# Patient Record
Sex: Male | Born: 1935 | Race: White | Hispanic: Yes | Smoking: Former smoker
Health system: Southern US, Community
[De-identification: ages and names within clinical notes are randomized; demographics above are authoritative.]

## PROBLEM LIST (undated history)

## (undated) ENCOUNTER — Emergency Department (HOSPITAL_COMMUNITY): Payer: Medicare HMO | Source: Home / Self Care

## (undated) DIAGNOSIS — C61 Malignant neoplasm of prostate: Secondary | ICD-10-CM

## (undated) DIAGNOSIS — I10 Essential (primary) hypertension: Secondary | ICD-10-CM

## (undated) DIAGNOSIS — H409 Unspecified glaucoma: Secondary | ICD-10-CM

## (undated) DIAGNOSIS — M7122 Synovial cyst of popliteal space [Baker], left knee: Secondary | ICD-10-CM

## (undated) DIAGNOSIS — G709 Myoneural disorder, unspecified: Secondary | ICD-10-CM

## (undated) HISTORY — PX: INSERTION BRACHYTHERAPY DEVICE: SHX6582

## (undated) HISTORY — DX: Essential (primary) hypertension: I10

## (undated) HISTORY — DX: Malignant neoplasm of prostate: C61

## (undated) HISTORY — DX: Unspecified glaucoma: H40.9

## (undated) HISTORY — PX: CATARACT EXTRACTION: SUR2

## (undated) HISTORY — PX: HERNIA REPAIR: SHX51

---

## 2015-06-10 DIAGNOSIS — R69 Illness, unspecified: Secondary | ICD-10-CM | POA: Diagnosis not present

## 2015-06-17 DIAGNOSIS — E7800 Pure hypercholesterolemia, unspecified: Secondary | ICD-10-CM | POA: Diagnosis not present

## 2015-06-26 DIAGNOSIS — R69 Illness, unspecified: Secondary | ICD-10-CM | POA: Diagnosis not present

## 2015-07-16 DIAGNOSIS — H40053 Ocular hypertension, bilateral: Secondary | ICD-10-CM | POA: Diagnosis not present

## 2015-10-07 DIAGNOSIS — H401131 Primary open-angle glaucoma, bilateral, mild stage: Secondary | ICD-10-CM | POA: Diagnosis not present

## 2015-11-13 DIAGNOSIS — I350 Nonrheumatic aortic (valve) stenosis: Secondary | ICD-10-CM | POA: Diagnosis not present

## 2015-11-13 DIAGNOSIS — H4010X0 Unspecified open-angle glaucoma, stage unspecified: Secondary | ICD-10-CM | POA: Diagnosis not present

## 2015-11-13 DIAGNOSIS — Z0000 Encounter for general adult medical examination without abnormal findings: Secondary | ICD-10-CM | POA: Diagnosis not present

## 2015-11-13 DIAGNOSIS — E7800 Pure hypercholesterolemia, unspecified: Secondary | ICD-10-CM | POA: Diagnosis not present

## 2015-11-13 DIAGNOSIS — Z8546 Personal history of malignant neoplasm of prostate: Secondary | ICD-10-CM | POA: Diagnosis not present

## 2015-11-13 DIAGNOSIS — Z79899 Other long term (current) drug therapy: Secondary | ICD-10-CM | POA: Diagnosis not present

## 2015-11-17 DIAGNOSIS — H4010X0 Unspecified open-angle glaucoma, stage unspecified: Secondary | ICD-10-CM | POA: Diagnosis not present

## 2015-11-17 DIAGNOSIS — Z0000 Encounter for general adult medical examination without abnormal findings: Secondary | ICD-10-CM | POA: Diagnosis not present

## 2015-11-17 DIAGNOSIS — E7800 Pure hypercholesterolemia, unspecified: Secondary | ICD-10-CM | POA: Diagnosis not present

## 2015-11-17 DIAGNOSIS — Z8546 Personal history of malignant neoplasm of prostate: Secondary | ICD-10-CM | POA: Diagnosis not present

## 2015-11-17 DIAGNOSIS — Z79899 Other long term (current) drug therapy: Secondary | ICD-10-CM | POA: Diagnosis not present

## 2015-12-30 DIAGNOSIS — R69 Illness, unspecified: Secondary | ICD-10-CM | POA: Diagnosis not present

## 2016-01-12 DIAGNOSIS — H4010X0 Unspecified open-angle glaucoma, stage unspecified: Secondary | ICD-10-CM | POA: Diagnosis not present

## 2016-01-12 DIAGNOSIS — E7800 Pure hypercholesterolemia, unspecified: Secondary | ICD-10-CM | POA: Diagnosis not present

## 2016-01-26 DIAGNOSIS — R69 Illness, unspecified: Secondary | ICD-10-CM | POA: Diagnosis not present

## 2016-01-29 DIAGNOSIS — R69 Illness, unspecified: Secondary | ICD-10-CM | POA: Diagnosis not present

## 2016-02-05 DIAGNOSIS — H401131 Primary open-angle glaucoma, bilateral, mild stage: Secondary | ICD-10-CM | POA: Diagnosis not present

## 2016-07-12 DIAGNOSIS — R69 Illness, unspecified: Secondary | ICD-10-CM | POA: Diagnosis not present

## 2016-08-19 DIAGNOSIS — H40053 Ocular hypertension, bilateral: Secondary | ICD-10-CM | POA: Diagnosis not present

## 2016-11-11 DIAGNOSIS — E7800 Pure hypercholesterolemia, unspecified: Secondary | ICD-10-CM | POA: Diagnosis not present

## 2016-11-11 DIAGNOSIS — Z79899 Other long term (current) drug therapy: Secondary | ICD-10-CM | POA: Diagnosis not present

## 2016-11-15 DIAGNOSIS — I350 Nonrheumatic aortic (valve) stenosis: Secondary | ICD-10-CM | POA: Diagnosis not present

## 2016-11-15 DIAGNOSIS — R030 Elevated blood-pressure reading, without diagnosis of hypertension: Secondary | ICD-10-CM | POA: Diagnosis not present

## 2016-11-15 DIAGNOSIS — Z8546 Personal history of malignant neoplasm of prostate: Secondary | ICD-10-CM | POA: Diagnosis not present

## 2016-11-15 DIAGNOSIS — H4010X0 Unspecified open-angle glaucoma, stage unspecified: Secondary | ICD-10-CM | POA: Diagnosis not present

## 2016-11-15 DIAGNOSIS — E7800 Pure hypercholesterolemia, unspecified: Secondary | ICD-10-CM | POA: Diagnosis not present

## 2016-11-15 DIAGNOSIS — Z0000 Encounter for general adult medical examination without abnormal findings: Secondary | ICD-10-CM | POA: Diagnosis not present

## 2017-02-19 DIAGNOSIS — R69 Illness, unspecified: Secondary | ICD-10-CM | POA: Diagnosis not present

## 2017-02-25 DIAGNOSIS — H401134 Primary open-angle glaucoma, bilateral, indeterminate stage: Secondary | ICD-10-CM | POA: Diagnosis not present

## 2017-02-28 DIAGNOSIS — R69 Illness, unspecified: Secondary | ICD-10-CM | POA: Diagnosis not present

## 2017-03-25 DIAGNOSIS — H401134 Primary open-angle glaucoma, bilateral, indeterminate stage: Secondary | ICD-10-CM | POA: Diagnosis not present

## 2017-05-18 DIAGNOSIS — H539 Unspecified visual disturbance: Secondary | ICD-10-CM | POA: Diagnosis not present

## 2017-05-18 DIAGNOSIS — I10 Essential (primary) hypertension: Secondary | ICD-10-CM | POA: Diagnosis not present

## 2017-05-18 DIAGNOSIS — G909 Disorder of the autonomic nervous system, unspecified: Secondary | ICD-10-CM | POA: Diagnosis not present

## 2017-05-18 DIAGNOSIS — R2689 Other abnormalities of gait and mobility: Secondary | ICD-10-CM | POA: Diagnosis not present

## 2017-06-21 DIAGNOSIS — M25551 Pain in right hip: Secondary | ICD-10-CM | POA: Diagnosis not present

## 2017-06-21 DIAGNOSIS — M545 Low back pain: Secondary | ICD-10-CM | POA: Diagnosis not present

## 2017-06-27 DIAGNOSIS — H401134 Primary open-angle glaucoma, bilateral, indeterminate stage: Secondary | ICD-10-CM | POA: Diagnosis not present

## 2017-06-29 ENCOUNTER — Encounter: Payer: Self-pay | Admitting: Rehabilitative and Restorative Service Providers"

## 2017-06-29 ENCOUNTER — Ambulatory Visit (INDEPENDENT_AMBULATORY_CARE_PROVIDER_SITE_OTHER): Payer: Medicare HMO | Admitting: Rehabilitative and Restorative Service Providers"

## 2017-06-29 DIAGNOSIS — M6281 Muscle weakness (generalized): Secondary | ICD-10-CM | POA: Diagnosis not present

## 2017-06-29 DIAGNOSIS — R29898 Other symptoms and signs involving the musculoskeletal system: Secondary | ICD-10-CM | POA: Diagnosis not present

## 2017-06-29 DIAGNOSIS — M545 Low back pain, unspecified: Secondary | ICD-10-CM

## 2017-06-29 DIAGNOSIS — R2689 Other abnormalities of gait and mobility: Secondary | ICD-10-CM | POA: Diagnosis not present

## 2017-06-29 NOTE — Patient Instructions (Signed)
HIP: Hamstrings - Supine  Place strap around foot. Raise leg up, keeping knee straight.  Bend opposite knee to protect back if indicated. Hold 30 seconds. 3 reps per set, 2-3 sets per day  Outer Hip Stretch: Reclined IT Band Stretch (Strap)   Strap around one foot, pull leg across body until you feel a pull or stretch in the outside of your hip, with shoulders on mat. Hold for 30 seconds. Repeat 3 times each leg. 2-3 times/day.  Piriformis Stretch   Lying on back, pull right knee toward opposite shoulder. Hold 30 seconds. Repeat 3 times. Do 2-3 sessions per day.  TENS UNIT: This is helpful for muscle pain and spasm.   Search and Purchase a TENS 7000 2nd edition at www.tenspros.com. It should be less than $30.     TENS unit instructions: Do not shower or bathe with the unit on Turn the unit off before removing electrodes or batteries If the electrodes lose stickiness add a drop of water to the electrodes after they are disconnected from the unit and place on plastic sheet. If you continued to have difficulty, call the TENS unit company to purchase more electrodes. Do not apply lotion on the skin area prior to use. Make sure the skin is clean and dry as this will help prolong the life of the electrodes. After use, always check skin for unusual red areas, rash or other skin difficulties. If there are any skin problems, does not apply electrodes to the same area. Never remove the electrodes from the unit by pulling the wires. Do not use the TENS unit or electrodes other than as directed. Do not change electrode placement without consultating your therapist or physician. Keep 2 fingers with between each electrode.   Sleeping on Back  Place pillow under knees. A pillow with cervical support and a roll around waist are also helpful. Copyright  VHI. All rights reserved.  Sleeping on Side Place pillow between knees. Use cervical support under neck and a roll around waist as  needed. Copyright  VHI. All rights reserved.   Sleeping on Stomach   If this is the only desirable sleeping position, place pillow under lower legs, and under stomach or chest as needed.  Posture - Sitting   Sit upright, head facing forward. Try using a roll to support lower back. Keep shoulders relaxed, and avoid rounded back. Keep hips level with knees. Avoid crossing legs for long periods. Stand to Sit / Sit to Stand   To sit: Bend knees to lower self onto front edge of chair, then scoot back on seat. To stand: Reverse sequence by placing one foot forward, and scoot to front of seat. Use rocking motion to stand up.   Work Height and Reach  Ideal work height is no more than 2 to 4 inches below elbow level when standing, and at elbow level when sitting. Reaching should be limited to arm's length, with elbows slightly bent.  Bending  Bend at hips and knees, not back. Keep feet shoulder-width apart.    Posture - Standing   Good posture is important. Avoid slouching and forward head thrust. Maintain curve in low back and align ears over shoul- ders, hips over ankles.  Alternating Positions   Alternate tasks and change positions frequently to reduce fatigue and muscle tension. Take rest breaks. Computer Work   Position work to Programmer, multimedia. Use proper work and seat height. Keep shoulders back and down, wrists straight, and elbows at right angles. Use  chair that provides full back support. Add footrest and lumbar roll as needed.  Getting Into / Out of Car  Lower self onto seat, scoot back, then bring in one leg at a time. Reverse sequence to get out.  Dressing  Lie on back to pull socks or slacks over feet, or sit and bend leg while keeping back straight.    Housework - Sink  Place one foot on ledge of cabinet under sink when standing at sink for prolonged periods.   Pushing / Pulling  Pushing is preferable to pulling. Keep back in proper alignment, and use leg  muscles to do the work.  Deep Squat   Squat and lift with both arms held against upper trunk. Tighten stomach muscles without holding breath. Use smooth movements to avoid jerking.  Avoid Twisting   Avoid twisting or bending back. Pivot around using foot movements, and bend at knees if needed when reaching for articles.  Carrying Luggage   Distribute weight evenly on both sides. Use a cart whenever possible. Do not twist trunk. Move body as a unit.   Lifting Principles .Maintain proper posture and head alignment. .Slide object as close as possible before lifting. .Move obstacles out of the way. .Test before lifting; ask for help if too heavy. .Tighten stomach muscles without holding breath. .Use smooth movements; do not jerk. .Use legs to do the work, and pivot with feet. .Distribute the work load symmetrically and close to the center of trunk. .Push instead of pull whenever possible.   Ask For Help   Ask for help and delegate to others when possible. Coordinate your movements when lifting together, and maintain the low back curve.  Log Roll   Lying on back, bend left knee and place left arm across chest. Roll all in one movement to the right. Reverse to roll to the left. Always move as one unit. Housework - Sweeping  Use long-handled equipment to avoid stooping.   Housework - Wiping  Position yourself as close as possible to reach work surface. Avoid straining your back.  Laundry - Unloading Wash   To unload small items at bottom of washer, lift leg opposite to arm being used to reach.  [place] close to area to be raked. Use arm movements to do the work. Keep back straight and avoid twisting.     Cart  When reaching into cart with one arm, lift opposite leg to keep back straight.   Getting Into / Out of Bed  Lower self to lie down on one side by raising legs and lowering head at the same time. Use arms to assist moving without twisting.  Bend both knees to roll onto back if desired. To sit up, start from lying on side, and use same move-ments in reverse. Housework - Vacuuming  Hold the vacuum with arm held at side. Step back and forth to move it, keeping head up. Avoid twisting.   Laundry - IT consultant so that bending and twisting can be avoided.   Laundry - Unloading Dryer  Squat down to reach into clothes dryer or use a reacher.  Gardening - Weeding / Probation officer or Kneel. Knee pads may be helpful.

## 2017-06-29 NOTE — Therapy (Signed)
[facility] [address] [place], [place], [address] Phone: [phone]   Fax:  [phone]  Physical Therapy Evaluation  Patient Details  Name: Council Munguia MRN: [id number] Date of Birth: [birth year] Referring Provider: Dr Ophelia Charter; Dr [name]    Encounter Date: 06/29/2017  PT End of Session - 06/29/17 1418    Visit Number  1    Number of Visits  12    Date for PT Re-Evaluation  08/10/17    PT Start Time  1418    PT Stop Time  1520    PT Time Calculation (min)  62 min    Activity Tolerance  Patient tolerated treatment well       History reviewed. No pertinent past medical history.  History reviewed. No pertinent surgical history.  There were no vitals filed for this visit.   Subjective Assessment - 06/29/17 1422    Subjective  Patient reports that he has had difficulty with walking for about three months. He had several events contributing to symptoms - reaction to medications, swelling in ankles and knees; elevated HR; statin drugs. He noticed shuffling gait pattern in past three months and weakness in the Rt lumbar area.     Pertinent History  intraocular pressure; elevated cholestrol; heart murmur (Patient does not have a list of current medications- requested)     Patient Stated Goals  decrease pain in the Rt LB when standing and walking; improve shuffling gait     Currently in Pain?  Yes    Pain Score  0-No pain 3/10 with standing and walking     Pain Location  Back    Pain Orientation  Right    Pain Descriptors / Indicators  Aching;Dull    Pain Type  Acute pain    Pain Onset  More than a month ago    Pain Frequency  Intermittent    Aggravating Factors   standing and walking     Pain Relieving Factors  sitting; meds          [facility] PT Assessment - 06/29/17 0001      Assessment   Medical Diagnosis  LBP; LE weakness; shuffling gait     Referring Provider  Dr Ophelia Charter; Dr [name]     Onset  Date/Surgical Date  03/26/17    Hand Dominance  Right    Next MD Visit  PRN     Prior Therapy  none for this condition       Precautions   Precautions  None      Balance Screen   Has the patient fallen in the past 6 months  No    Has the patient had a decrease in activity level because of a fear of falling?   No    Is the patient reluctant to leave their home because of a fear of falling?   No      Prior Function   Level of Independence  Independent lives alone     Vocation  Retired    Landscape architect - retired 2009     Leisure  household chores; yard work; Teacher, English as a foreign language; reading; radio; visit family; exercise daily squats and stretches       Sensation   Additional Comments  WFL's per pt report       Posture/Postural Control   Posture Comments  head forward; shoulders rounded and elevated; flexed forward at trunk and hips       AROM  Right/Left Hip  -- end range tightness bilat esp in hip extension     Lumbar Flexion  80% pull Rt LB    Lumbar Extension  25%    Lumbar - Right Side Bend  60%    Lumbar - Left Side Bend  50% pull Rt LB    Lumbar - Right Rotation  10%    Lumbar - Left Rotation  20% pull Rt LB       Strength   Strength Assessment Site  -- bilat knee flex/ext 5/5     Right/Left Hip  -- hip 5/5 except as noted     Right Hip Extension  4+/5    Left Hip Extension  4/5      Flexibility   Hamstrings  tight Rt 62 deg; Lt 57 deg    Quadriceps  tigth bilat     ITB  tight bilat     Piriformis  tight Rt > Lt       Palpation   Spinal mobility  hypomobility lumbar spine     SI assessment   tender Rt SI area     Palpation comment  muscular tightness lumbar paraspinals; QL; piriformis; glut med/min Rt       Ambulation/Gait   Gait Comments  shuffling gait pattern with initiation of gait; improved gait pattern wihen gait is established. -- carries single point cane which is too tall minimal to no functional use of cane       Berg Balance Test   Sit to Stand   Able to stand without using hands and stabilize independently    Standing Unsupported  Able to stand safely 2 minutes    Sitting with Back Unsupported but Feet Supported on Floor or Stool  Able to sit safely and securely 2 minutes    Stand to Sit  Controls descent by using hands    Transfers  Able to transfer safely, minor use of hands    Standing Unsupported with Eyes Closed  Able to stand 10 seconds safely    Standing Ubsupported with Feet Together  Able to place feet together independently and stand 1 minute safely    From Standing, Reach Forward with Outstretched Arm  Can reach confidently >25 cm (10")    From Standing Position, Pick up Object from Floor  Able to pick up shoe safely and easily    From Standing Position, Turn to Look Behind Over each Shoulder  Looks behind from both sides and weight shifts well    Turn 360 Degrees  Able to turn 360 degrees safely but slowly    Standing Unsupported, Alternately Place Feet on Step/Stool  Able to complete >2 steps/needs minimal assist    Standing Unsupported, One Foot in Front  Able to place foot tandem independently and hold 30 seconds    Standing on One Leg  Unable to try or needs assist to prevent fall    Total Score  46    Berg comment:  moderate risk for falls              Objective measurements completed on examination: See above findings.      [facility] Adult PT Treatment/Exercise - 06/29/17 0001      Lumbar Exercises: Stretches   Passive Hamstring Stretch  Right;Left;2 reps;30 seconds supine with strap     ITB Stretch  Right;Left;2 reps;30 seconds supine with strap     Piriformis Stretch  Right;Left;2 reps;30 seconds supine travell  Moist Heat Therapy   Number Minutes Moist Heat  20 Minutes    Moist Heat Location  Lumbar Spine;Hip Rt      Electrical Stimulation   Electrical Stimulation Location  Rt lumbar paraspinals; Rt posterior hip     Electrical Stimulation Action  IFC    Electrical Stimulation Parameters  to  tolerance    Electrical Stimulation Goals  Pain;Tone             PT Education - 06/29/17 1502    Education provided  Yes    Education Details  HEP TENS back care     Person(s) Educated  Patient    Methods  Explanation;Demonstration;Tactile cues;Verbal cues;Handout    Comprehension  Verbalized understanding;Returned demonstration;Verbal cues required;Tactile cues required          PT Long Term Goals - 06/29/17 1525      PT LONG TERM GOAL #1   Title  Improve posture and alignment with standing and walking 08/10/17    Time  6    Period  Weeks    Status  New      PT LONG TERM GOAL #2   Title  Increase strength bilat LE's to 5/5 with manual muscle testing 08/10/17    Time  6    Period  Weeks    Status  New      PT LONG TERM GOAL #3   Title  Improve Berg Balance scale score to indicated decrease fall risk 08/10/17    Time  6    Period  Weeks    Status  New      PT LONG TERM GOAL #4   Title  Improve gait pattern with improved initiation of gait thus improving gait function and gait 08/10/17    Time  6    Period  Weeks    Status  New      PT LONG TERM GOAL #5   Title  Independent in HEP 08/10/17    Time  6    Period  Weeks    Status  New             Plan - 06/29/17 1519    Clinical Impression Statement  Thayer presents with reports of 3 month history of Rt LBP which he feels has created shuffling giat pattern. He has limited trunk and LE mobility and ROM; decreased hip extension strength; muscular tightness to palpation Rt lumbar and Rt hip; abnormal gait pattern with difficulty initiating gait. Gait pattern improves with continued gait. He has moderated risk for falls based on Berg Balance Scale. Kentarius will benefit from PT to address problems identified. Vasiliy is interested in trial of dry needling for Rt lumbar and hip pain.     Clinical Presentation  Evolving    Clinical Decision Making  Low    Rehab Potential  Good    PT Frequency  2x / week    PT Duration   6 weeks    PT Treatment/Interventions  Patient/family education;ADLs/Self Care Home Management;Cryotherapy;Electrical Stimulation;Iontophoresis 4mg /ml Dexamethasone;Moist Heat;Ultrasound;Dry needling;Manual techniques;Neuromuscular re-education;Therapeutic activities;Therapeutic exercise    PT Next Visit Plan  review HEP; add core exercise; trial of DN vs manual work; further assess gait and adjust cane height (too tall currently); back care education; modalities as indicated     Consulted and Agree with Plan of Care  Patient       Patient will benefit from skilled therapeutic intervention in order to improve the following deficits and impairments:  Postural  dysfunction, Improper body mechanics, Pain, Increased muscle spasms, Increased fascial restricitons, Decreased mobility, Decreased range of motion, Decreased strength, Decreased activity tolerance, Abnormal gait  Visit Diagnosis: Other abnormalities of gait and mobility - Plan: PT plan of care cert/re-cert, CANCELED: PT plan of care cert/re-cert  Acute right-sided low back pain without sciatica - Plan: PT plan of care cert/re-cert, CANCELED: PT plan of care cert/re-cert  Other symptoms and signs involving the musculoskeletal system - Plan: PT plan of care cert/re-cert, CANCELED: PT plan of care cert/re-cert  Muscle weakness (generalized) - Plan: PT plan of care cert/re-cert, CANCELED: PT plan of care cert/re-cert     Problem List There are no active problems to display for this patient.   [place], MPH  06/29/2017, 5:25 PM  [facility] [place] [place], [place], [address] Phone: [phone]   Fax:  [phone]  Name: Romaldo Saville MRN: [id number] Date of Birth: Mar 02, 1932

## 2017-07-04 ENCOUNTER — Ambulatory Visit: Payer: Medicare HMO | Admitting: Neurology

## 2017-07-08 ENCOUNTER — Encounter: Payer: Self-pay | Admitting: Rehabilitative and Restorative Service Providers"

## 2017-07-08 ENCOUNTER — Ambulatory Visit: Payer: Medicare HMO | Admitting: Rehabilitative and Restorative Service Providers"

## 2017-07-08 DIAGNOSIS — R2689 Other abnormalities of gait and mobility: Secondary | ICD-10-CM

## 2017-07-08 DIAGNOSIS — R29898 Other symptoms and signs involving the musculoskeletal system: Secondary | ICD-10-CM

## 2017-07-08 DIAGNOSIS — M545 Low back pain, unspecified: Secondary | ICD-10-CM

## 2017-07-08 DIAGNOSIS — M6281 Muscle weakness (generalized): Secondary | ICD-10-CM | POA: Diagnosis not present

## 2017-07-08 NOTE — Patient Instructions (Addendum)
Bridging    Slowly raise buttocks from floor, keeping stomach tight. Repeat _8-10___ times per set.  Do ___2_ sessions per day.    Strengthening: Hip Abductor - Resisted    Lying flat With band looped around both legs above knees, push one leg to the side while holding opposite leg still - repeat with opposite leg  Repeat __10__ times per set. Do _1-2___ sets per session. Do __1__ sessions per day.   Trigger Point Dry Needling  . What is Trigger Point Dry Needling (DN)? o DN is a physical therapy technique used to treat muscle pain and dysfunction. Specifically, DN helps deactivate muscle trigger points (muscle knots).  o A thin filiform needle is used to penetrate the skin and stimulate the underlying trigger point. The goal is for a local twitch response (LTR) to occur and for the trigger point to relax. No medication of any kind is injected during the procedure.   . What Does Trigger Point Dry Needling Feel Like?  o The procedure feels different for each individual patient. Some patients report that they do not actually feel the needle enter the skin and overall the process is not painful. Very mild bleeding may occur. However, many patients feel a deep cramping in the muscle in which the needle was inserted. This is the local twitch response.   Marland Kitchen How Will I feel after the treatment? o Soreness is normal, and the onset of soreness may not occur for a few hours. Typically this soreness does not last longer than two days.  o Bruising is uncommon, however; ice can be used to decrease any possible bruising.  o In rare cases feeling tired or nauseous after the treatment is normal. In addition, your symptoms may get worse before they get better, this period will typically not last longer than 24 hours.   . What Can I do After My Treatment? o Increase your hydration by drinking more water for the next 24 hours. o You may place ice or heat on the areas treated that have become sore,  however, do not use heat on inflamed or bruised areas. Heat often brings more relief post needling. o You can continue your regular activities, but vigorous activity is not recommended initially after the treatment for 24 hours. o DN is best combined with other physical therapy such as strengthening, stretching, and other therapies.

## 2017-07-08 NOTE — Therapy (Signed)
[place], [place], [address] Phone: [phone]   Fax:  [phone]  Physical Therapy Treatment  Patient Details  Name: Jeffrey Porter MRN: [id number] Date of Birth: [birth year] Referring Provider: Dr Ninfa Linden; Dr [name]    Encounter Date: 07/08/2017  PT End of Session - 07/08/17 1411    Visit Number  2    Number of Visits  12    Date for PT Re-Evaluation  08/10/17    PT Start Time  1404    PT Stop Time  1500    PT Time Calculation (min)  56 min    Activity Tolerance  Patient tolerated treatment well       History reviewed. No pertinent past medical history.  History reviewed. No pertinent surgical history.  There were no vitals filed for this visit.  Subjective Assessment - 07/08/17 1411    Subjective  Patient reports that his back pain is gone. He has burning pain in the Rt hip with stretching the Rt hip. No pain with no activity. He has researched the [facility] website and feels that he is having sciatica Rt Leg.     Currently in Pain?  No/denies         [facility] PT Assessment - 07/08/17 0001      Assessment   Medical Diagnosis  LBP; LE weakness; shuffling gait     Referring Provider  Dr Ninfa Linden; Dr [name]     Onset Date/Surgical Date  03/26/17    Hand Dominance  Right    Next MD Visit  PRN     Prior Therapy  none for this condition       Strength   Right Hip Flexion  4+/5    Right Hip Extension  4/5    Right Hip ABduction  4+/5    Left Hip Flexion  4+/5    Left Hip Extension  4+/5    Left Hip ABduction  4+/5    Right/Left Knee  -- 5/5 bilat knee flex/ext       Palpation   Palpation comment  muscular tightness Rt > Lt lumbar paraspinals; QL; piriformis; glut med/min Rt       Ambulation/Gait   Gait Comments  shuffling gait pattern with initiation of gait; improved gait pattern wihen gait is established. -- carries single point cane which is too tall minimal to no  functional use of cane                   [facility] Adult PT Treatment/Exercise - 07/08/17 0001      Lumbar Exercises: Stretches   Passive Hamstring Stretch  Right;Left;2 reps;30 seconds supine with strap     ITB Stretch  Right;Left;2 reps;30 seconds supine with strap     Piriformis Stretch  Right;Left;2 reps;30 seconds supine travell       Lumbar Exercises: Supine   Bridge  10 reps;3 seconds HS cramping at #7     Other Supine Lumbar Exercises  Clam with green TB holding one LE still moving opposite x 10 repeat with opposite LE       Moist Heat Therapy   Number Minutes Moist Heat  20 Minutes    Moist Heat Location  Lumbar Spine;Hip Rt      Electrical Stimulation   Electrical Stimulation Location  Rt lumbar paraspinals; Rt posterior hip     Electrical Stimulation Action  IFC    Electrical Stimulation Parameters  to  tolerance    Electrical Stimulation Goals  Pain;Tone      Manual Therapy   Manual therapy comments  pt prone     Joint Mobilization  Rt greater trochanter PA mobs pt in prone     Soft tissue mobilization  deep tissue work through the Rt piriformis and gluts     Myofascial Release  Rt posterior hip        Trigger Point Dry Needling - 07/08/17 1444    Consent Given?  Yes    Education Handout Provided  Yes    Muscles Treated Lower Body  -- Rt side with estim     Gluteus Maximus Response  Palpable increased muscle length    Piriformis Response  Palpable increased muscle length           PT Education - 07/08/17 1449    Education provided  Yes    Education Details  HEP DN           PT Long Term Goals - 07/08/17 1429      PT LONG TERM GOAL #1   Title  Improve posture and alignment with standing and walking 08/10/17    Time  6    Period  Weeks    Status  On-going      PT LONG TERM GOAL #2   Title  Increase strength bilat LE's to 5/5 with manual muscle testing 08/10/17    Time  6    Period  Weeks    Status  On-going      PT LONG TERM GOAL #3    Title  Improve Berg Balance scale score to indicated decrease fall risk 08/10/17    Time  6    Period  Weeks    Status  On-going      PT LONG TERM GOAL #4   Title  Improve gait pattern with improved initiation of gait thus improving gait function and gait 08/10/17    Time  6    Period  Weeks    Status  On-going      PT LONG TERM GOAL #5   Title  Independent in HEP 08/10/17    Time  6    Period  Weeks    Status  On-going            Plan - 07/08/17 1430    Clinical Impression Statement  Jeffrey Porter reports resolution of Rt LBP but continued pain in the Rt hip which is feels is sciatica. He continues to feel that the shuffling gait is due to the sciatica. He has some Rt LE weakness in hip extension but other LE strength is WFL's. Pt does have palpable tightness through the Rt posterior hip in the piriformis and glut musculature.     Rehab Potential  Good    PT Frequency  2x / week    PT Duration  6 weeks    PT Treatment/Interventions  Patient/family education;ADLs/Self Care Home Management;Cryotherapy;Electrical Stimulation;Iontophoresis 4mg /ml Dexamethasone;Moist Heat;Ultrasound;Dry needling;Manual techniques;Neuromuscular re-education;Therapeutic activities;Therapeutic exercise    PT Next Visit Plan  review HEP; add core exercise; assess response to DN & manual work; further assess gait and adjust cane height (too tall currently); back care education; modalities as indicated     Consulted and Agree with Plan of Care  Patient       Patient will benefit from skilled therapeutic intervention in order to improve the following deficits and impairments:  Postural dysfunction, Improper body mechanics, Pain, Increased muscle spasms, Increased fascial  restricitons, Decreased mobility, Decreased range of motion, Decreased strength, Decreased activity tolerance, Abnormal gait  Visit Diagnosis: Other abnormalities of gait and mobility  Acute right-sided low back pain without sciatica  Other  symptoms and signs involving the musculoskeletal system  Muscle weakness (generalized)     Problem List There are no active problems to display for this patient.   [place], MPH  07/08/2017, 2:50 PM  [facility] [place], [place], [address] Phone: [phone]   Fax:  [phone]  Name: Jeffrey Porter MRN: [id number] Date of Birth: January 30, 1932

## 2017-07-14 ENCOUNTER — Ambulatory Visit: Payer: Medicare HMO | Admitting: Neurology

## 2017-07-14 VITALS — BP 168/81 | HR 100 | Wt 140.0 lb

## 2017-07-14 DIAGNOSIS — R269 Unspecified abnormalities of gait and mobility: Secondary | ICD-10-CM

## 2017-07-14 NOTE — Progress Notes (Signed)
Subjective:    Patient ID: Jeffrey Porter is a 84 y.o. male.  HPI     Star Age, MD, PhD [facility] [address] [place], [place] [address]  Dear Dr. Sabra Heck,   I saw your patient, Jeffrey Porter, upon your kind request in my neurologic clinic today for initial consultation of his gait disorder and concern for parkinsonism. The patient is unaccompanied today. As you know, Jeffrey Porter is a very pleasant 84 year old right-handed gentleman with an underlying medical history of prostate cancer, history of aortic stenosis, colonic polyps, history of glaucoma, status post cataract and hernia surgeries, and borderline overweight state, who reports difficulty walking for the past year or so. His symptoms started about a year ago when he tripped to [place] and involved fairly strenuous walking including up a [place]. He noticed that he had ankle swelling at the time. He also notices that when he took his eyedrops which was timolol at the time he became really sleepy and lethargic and had leg or of energy from it. When he read about potential side effects with timolol in September 2018 he decided to stop it. In November 2018 he took a trip to [place] with his daughter who lives in [place]. When he was walking up the hill he noticed soreness in both knees and weakness in both legs and it was fairly strenuous for him. He had been started on brimonidine eyedrops but completed only 50 days of it because he had significant lack of energy after taking those eyedrops as well. His ophthalmologist advised him to continue with his latanoprost only. He has been on this for 5 years or so. He has been suffering from low back pain and hip pain. He started seeing an orthopedic doctor and was treated with a 12 day course of prednisone which he recently finished earlier this month. He was also referred to physical therapy to do core strengthening exercises and has had dry  needling which has been quite helpful. He has also been given a TENS unit which he has not used yet. He has a history of left knee pain and Baker's cyst on the left knee. He also suffers from significant plantar fasciitis. He started using a walking stick about a month ago. Overall, the combination of treatment with prednisone and physical therapy with dry needling and exercises he feels that he has actually improved quite a bit. He has not had any recent fall. He admits that he does not drink very much water, altogether may be 2 small glasses per day. He drinks half a liter of coffee per day and drinks some milk or soy milk. He is widowed for 17 years, and lives alone. 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 He has 2 daughters, quit smoking in 2015, drinks red wine about 3 ounces per day on average. He reports painful leg cramps at times.  His Past Medical History Is Significant For: Past Medical History:  Diagnosis Date  . Glaucoma   . Prostate cancer [facility])     His Past Surgical History Is Significant For: Past Surgical History:  Procedure Laterality Date  . CATARACT EXTRACTION    . HERNIA REPAIR      His Family History Is Significant For: No family history on file.  His Social History Is Significant For: Social History   Socioeconomic History  . Marital status: Widowed    Spouse name: Not on file  . Number of  children: Not on file  . Years of education: Not on file  . Highest education level: Not on file  Occupational History  . Not on file  Social Needs  . Financial resource strain: Not on file  . Food insecurity:    Worry: Not on file    Inability: Not on file  . Transportation needs:    Medical: Not on file    Non-medical: Not on file  Tobacco Use  . Smoking status: Not on file  Substance and Sexual Activity  . Alcohol use: Not on file  . Drug use: Not on file  . Sexual activity: Not on file  Lifestyle  . Physical activity:    Days per week: Not on  file    Minutes per session: Not on file  . Stress: Not on file  Relationships  . Social connections:    Talks on phone: Not on file    Gets together: Not on file    Attends religious service: Not on file    Active member of club or organization: Not on file    Attends meetings of clubs or organizations: Not on file    Relationship status: Not on file  Other Topics Concern  . Not on file  Social History Narrative  . Not on file    His Allergies Are:  No Known Allergies:   His Current Medications Are:  Outpatient Encounter Medications as of 07/14/2017  Medication Sig  . BRIMONIDINE TARTRATE OP Apply to eye.  . latanoprost (XALATAN) 0.005 % ophthalmic solution Place 1 drop into both eyes at bedtime.  . [DISCONTINUED] b complex vitamins tablet Take by mouth.  . [DISCONTINUED] LUTEIN-ZEAXANTHIN PO Take by mouth.  . [DISCONTINUED] MAGNESIUM PO Take by mouth.   No facility-administered encounter medications on file as of 07/14/2017.   :   Review of Systems:  Out of a complete 14 point review of systems, all are reviewed and negative with the exception of these symptoms as listed below: Review of Systems  Objective:  Neurological Exam  Physical Exam Physical Examination:   Vitals:   07/14/17 1423  BP: (!) 168/81  Pulse: 100    General Examination: The patient is a very pleasant 84 y.o. male in no acute distress. He appears well-developed and well-nourished and well groomed.   HEENT: Normocephalic, atraumatic, pupils are equal, round and reactive to light and accommodation. He is status post cataract repairs bilaterally. He does appear to have mild facial masking but normal eye blink rate and good tracking with his eyes. He has no significant nuchal rigidity, no lip, neck or jaw tremor. Hearing is slightly impaired appearing. He has on oropharynx examination moderate mouth dryness, tongue protrudes centrally and palate elevates symmetrically. He has no dysarthria or  hypophonia.  Chest: Clear to auscultation without wheezing, rhonchi or crackles noted.  Heart: S1+S2+0, regular, with 4/6 systolic murmur.   Abdomen: Soft, non-tender and non-distended with normal bowel sounds appreciated on auscultation.  Extremities: There is no pitting edema in the distal lower extremities bilaterally. Pedal pulses are intact.  Skin: Warm and dry without trophic changes noted.  Musculoskeletal: exam reveals no obvious joint deformities, tenderness or joint swelling or erythema.   Neurologically:  Mental status: The patient is awake, alert and oriented in all 4 spheres. His immediate and remote memory, attention, language skills and fund of knowledge are appropriate. There is no evidence of aphasia, agnosia, apraxia or anomia. Speech is clear with normal prosody and enunciation. Thought process  is linear. Mood is normal and affect is normal.  Cranial nerves II - XII are as described above under HEENT exam. In addition: shoulder shrug is normal with equal shoulder height noted. Motor exam: Normal bulk, strength and tone is noted. There is no drift, tremor or rebound. Reflexes are 2+ throughout. Fine motor skills and coordination: intact with normal finger taps, normal hand movements, normal rapid alternating patting, normal foot taps and normal foot agility.  Cerebellar testing: No dysmetria or intention tremor. There is no truncal or gait ataxia.  Sensory exam: intact to light touch in the upper and lower extremities.  Gait, station and balance: He stands easily. No veering to one side is noted. No leaning to one side is noted. Posture is age-appropriate but stance is slightly wide-based. He turns with several stutter steps. His gait is overall normal and shows preserved arm swing but when he first starts walking he makes several stutter steps. No telltale parkinsonian shuffling.  Assessment and Plan:    In summary, Jeffrey Porter is a very pleasant 84 y.o.-year old male  with  an underlying medical history of prostate cancer, history of aortic stenosis, colonic polyps, history of glaucoma, status post cataract and hernia surgeries, and borderline overweight state, who presents for neurologic consultation of his gait disorder. On examination he has a nonspecific gait disorder with initial stutter steps and difficulty with turns but nonspecific overall, no telltale signs of Parkinson's disease or parkinsonism. I think his gait disorder is likely due to a combination of factors including arthritis affecting his lower back, hips, knees and also plantar fasciitis, normal aging, plus also he admits that he is suboptimal with his water intake. He has benefited from physical therapy and dry needling and is also motivated to try his TENS unit. I would recommend no specific medication or additional diagnostic test from my end of things, he does not have focal neurological exam, overall fairly benign findings today which were reassuring. I did ask the patient to continue with exercising regularly, taking care of his nutrition, resting well at night. He admits that he goes to bed late around midnight but does sleep well tell typically 7:30 AM. He is encouraged to drink more water, use his cane for safety and continue with physical therapy. At this juncture, I would like to suggest an as needed follow-up. I answered all his questions today and he was in agreement with the plan.  Thank you very much for allowing me to participate in the care of this nice patient. If I can be of any further assistance to you please do not hesitate to call me at [phone].  Sincerely,   Star Age, MD, PhD

## 2017-07-14 NOTE — Patient Instructions (Addendum)
I do not see any telltale signs of parkinsonism.   Your gait disorder is likely due to a combination of things: normal aging, degenerative arthritis of your back, and hips and knees, plantar fasciitis, and suboptimal hydration with water.   Remember to drink plenty of fluid, eat healthy meals and do not skip any meals. Try to eat protein with a every meal and eat a healthy snack such as fruit or nuts in between meals. Try to keep a regular sleep-wake schedule and try to exercise daily, particularly in the form of walking, 20-30 minutes a day, if you can. Change positions slowly and you should use your cane for safety. I think continuing Physical therapy is a good idea.   As far as your medications are concerned, I would like to suggest no new medications.   I don't think we need to do any further dignostic testing from my end of things. I would be happy to see you back as needed.

## 2017-07-20 ENCOUNTER — Ambulatory Visit (INDEPENDENT_AMBULATORY_CARE_PROVIDER_SITE_OTHER): Payer: Medicare HMO | Admitting: Rehabilitative and Restorative Service Providers"

## 2017-07-20 DIAGNOSIS — M545 Low back pain, unspecified: Secondary | ICD-10-CM

## 2017-07-20 DIAGNOSIS — R2689 Other abnormalities of gait and mobility: Secondary | ICD-10-CM

## 2017-07-20 DIAGNOSIS — R29898 Other symptoms and signs involving the musculoskeletal system: Secondary | ICD-10-CM

## 2017-07-20 DIAGNOSIS — M6281 Muscle weakness (generalized): Secondary | ICD-10-CM | POA: Diagnosis not present

## 2017-07-20 NOTE — Therapy (Signed)
[place], [place], [address] Phone: [phone]   Fax:  [phone]  Physical Therapy Treatment  Patient Details  Name: Jeffrey Porter MRN: [id number] Date of Birth: 1932-02-19 Referring Provider: Dr Ninfa Linden; Dr [name]    Encounter Date: 07/20/2017  PT End of Session - 07/20/17 1409    Visit Number  3    Number of Visits  12    Date for PT Re-Evaluation  08/10/17    PT Start Time  1400    PT Stop Time  1449    PT Time Calculation (min)  49 min    Activity Tolerance  Patient tolerated treatment well       Past Medical History:  Diagnosis Date  . Glaucoma   . Prostate cancer [facility])     Past Surgical History:  Procedure Laterality Date  . CATARACT EXTRACTION    . HERNIA REPAIR      There were no vitals filed for this visit.  Subjective Assessment - 07/20/17 1411    Subjective  Patient reports thta he was seen by the neurologist and the neurologist does not see any "taletell signs of parkinsonism" Stephanie feels his problem is in the Rt hip - he has difficulty turning to the right.     Currently in Pain?  No/denies                No data recorded       [facility] Adult PT Treatment/Exercise - 07/20/17 0001      Lumbar Exercises: Stretches   Passive Hamstring Stretch  Right;Left;2 reps;30 seconds supine with strap     Single Knee to Chest Stretch  Right;Left;2 reps;30 seconds    ITB Stretch  Right;Left;2 reps;30 seconds supine with strap     Piriformis Stretch  Right;Left;2 reps;30 seconds supine travell     Other Lumbar Stretch Exercise  knee to opposite shoulder x2 reps 30 sec     Other Lumbar Stretch Exercise  trunk rotation lower body - then upper body individually x 15-20 each side       Lumbar Exercises: Standing   Wall Slides  10 reps;5 seconds      Lumbar Exercises: Sidelying   Hip Abduction  Right;Left;10 reps;2 seconds      Moist Heat Therapy   Number Minutes  Moist Heat  20 Minutes    Moist Heat Location  Lumbar Spine;Hip Rt      Electrical Stimulation   Electrical Stimulation Location  Rt lumbar paraspinals; Rt posterior hip     Electrical Stimulation Action  IFC    Electrical Stimulation Parameters  to tolerance    Electrical Stimulation Goals  Pain;Tone                  PT Long Term Goals - 07/20/17 1410      PT LONG TERM GOAL #1   Title  Improve posture and alignment with standing and walking 08/10/17    Time  6    Period  Weeks    Status  On-going      PT LONG TERM GOAL #2   Title  Increase strength bilat LE's to 5/5 with manual muscle testing 08/10/17    Time  6    Period  Weeks    Status  On-going      PT LONG TERM GOAL #3   Title  Improve Berg Balance scale score to indicated decrease fall risk 08/10/17  Time  6    Period  Weeks    Status  On-going      PT LONG TERM GOAL #4   Title  Improve gait pattern with improved initiation of gait thus improving gait function and gait 08/10/17    Time  6    Period  Weeks    Status  On-going      PT LONG TERM GOAL #5   Title  Independent in HEP 08/10/17    Time  6    Period  Weeks    Status  On-going            Plan - 07/20/17 1417    Clinical Impression Statement  Laden continues to have difficulty with initiation of gait. He is focused on the tightness and weakness in Rt hip. He wants to focus on the hip. Patient will benefit from continued work with focus on gait.     Rehab Potential  Good    PT Frequency  2x / week    PT Duration  6 weeks    PT Treatment/Interventions  Patient/family education;ADLs/Self Care Home Management;Cryotherapy;Electrical Stimulation;Iontophoresis 4mg /ml Dexamethasone;Moist Heat;Ultrasound;Dry needling;Manual techniques;Neuromuscular re-education;Therapeutic activities;Therapeutic exercise    PT Next Visit Plan  review HEP; add core exercise; assess response to DN & manual work; further assess gait and adjust cane height (too tall  currently); back care education; modalities as indicated     Consulted and Agree with Plan of Care  Patient       Patient will benefit from skilled therapeutic intervention in order to improve the following deficits and impairments:  Postural dysfunction, Improper body mechanics, Pain, Increased muscle spasms, Increased fascial restricitons, Decreased mobility, Decreased range of motion, Decreased strength, Decreased activity tolerance, Abnormal gait  Visit Diagnosis: Other abnormalities of gait and mobility  Acute right-sided low back pain without sciatica  Other symptoms and signs involving the musculoskeletal system  Muscle weakness (generalized)     Problem List There are no active problems to display for this patient.   Helen, MPH  07/20/2017, 2:44 PM  [facility] [place], [place], [address] Phone: [phone]   Fax:  [phone]  Name: Jeffrey Porter MRN: [id number] Date of Birth: 1931/12/01

## 2017-07-20 NOTE — Patient Instructions (Addendum)
Strengthening: Hip Abduction (Side-Lying)    Tighten muscles of left thigh, lead up with your heel, then lift leg _8-10___ inches from surface, keeping knee locked.  Repeat _10___ times per set. Do ___1-2_ sets per session. Do _1-2__ sessions per day.    Strengthening: Wall Slide    Leaning on wall, slowly lower buttocks until thighs are parallel to floor. Hold __10__ seconds. Tighten thigh muscles and return. Repeat __10__ times per set. Do __1-3__ sets per session. Do __1-2__ sessions per day.   Knee Roll    Lying on back, with knees bent and feet flat on floor, arms outstretched to sides, slowly roll both knees to side, hold 5 seconds. Back to starting position, hold 5 seconds. Then to opposite side, hold 5 seconds. Return to starting position. Keep shoulders and arms in contact with floor.   Then hold legs still and rotate upper body

## 2017-07-22 ENCOUNTER — Ambulatory Visit: Payer: Medicare HMO | Admitting: Physical Therapy

## 2017-07-22 DIAGNOSIS — M545 Low back pain, unspecified: Secondary | ICD-10-CM

## 2017-07-22 DIAGNOSIS — R29898 Other symptoms and signs involving the musculoskeletal system: Secondary | ICD-10-CM | POA: Diagnosis not present

## 2017-07-22 DIAGNOSIS — M6281 Muscle weakness (generalized): Secondary | ICD-10-CM

## 2017-07-22 DIAGNOSIS — R2689 Other abnormalities of gait and mobility: Secondary | ICD-10-CM

## 2017-07-22 NOTE — Therapy (Signed)
[place], [place], [address] Phone: [phone]   Fax:  [phone]  Physical Therapy Treatment  Patient Details  Name: Jeffrey Porter MRN: [id number] Date of Birth: March 23, 1932 Referring Provider: Dr Ninfa Linden; Dr [name]    Encounter Date: 07/22/2017  PT End of Session - 07/22/17 1452    Visit Number  4    Number of Visits  12    Date for PT Re-Evaluation  08/10/17    PT Start Time  1402    PT Stop Time  1502    PT Time Calculation (min)  60 min    Activity Tolerance  Patient tolerated treatment well    Behavior During Therapy  [facility] for tasks assessed/performed       Past Medical History:  Diagnosis Date  . Glaucoma   . Prostate cancer [facility])     Past Surgical History:  Procedure Laterality Date  . CATARACT EXTRACTION    . HERNIA REPAIR      There were no vitals filed for this visit.  Subjective Assessment - 07/22/17 1403    Subjective  "Yesterday I walked for the longest I have in a long time.  I walked all day long (without cane)".  He reports he felt tired, but no increase in pain.  He has straps for plantar fascitis and this has "worked great".     Patient Stated Goals  decrease pain in the Rt LB when standing and walking; improve shuffling gait     Currently in Pain?  No/denies    Pain Score  0-No pain       [facility] Adult PT Treatment/Exercise - 07/22/17 0001      Lumbar Exercises: Stretches   Lower Trunk Rotation  -- 10 reps.     Piriformis Stretch  Right;Left;2 reps;30 seconds supine travell     Other Lumbar Stretch Exercise  knee to opposite shoulder x2 reps 30 sec (with opp leg bent)      Lumbar Exercises: Standing   Other Standing Lumbar Exercises  Toe taps to 6" step without UE support x 10, with CGA for safety.  last 5 reps, slow and controlled into 5 sec SLS.     Other Standing Lumbar Exercises  tandem stance with 20 sec hold each leg.       Lumbar Exercises: Seated    Other Seated Lumbar Exercises  PWR twist (reach and clap to each side);  PWR Rock (reaching over head and extended same leg to side) x 10 reps       Lumbar Exercises: Supine   Bridge  10 reps;5 seconds      Lumbar Exercises: Sidelying   Hip Abduction  Right;Left;10 reps;2 seconds    Other Sidelying Lumbar Exercises  upper trunk rotation (PWR twist) x 10 reps      Moist Heat Therapy   Number Minutes Moist Heat  20 Minutes    Moist Heat Location  Lumbar Spine;Hip Rt      Electrical Stimulation   Electrical Stimulation Location  Rt lumbar paraspinals; Rt posterior hip     Electrical Stimulation Action  IFC    Electrical Stimulation Parameters  to tolerance    Electrical Stimulation Goals  Tone;Pain                  PT Long Term Goals - 07/22/17 1421      PT LONG TERM GOAL #1   Title  Improve posture and alignment  with standing and walking 08/10/17    Time  6    Period  Weeks    Status  On-going improving      PT LONG TERM GOAL #2   Title  Increase strength bilat LE's to 5/5 with manual muscle testing 08/10/17    Time  6    Period  Weeks    Status  On-going      PT LONG TERM GOAL #3   Title  Improve Berg Balance scale score to indicated decrease fall risk 08/10/17    Time  6    Period  Weeks    Status  On-going      PT LONG TERM GOAL #4   Title  Improve gait pattern with improved initiation of gait thus improving gait function and gait 08/10/17    Time  6    Period  Weeks    Status  On-going improving with cues.       PT LONG TERM GOAL #5   Title  Independent in HEP 08/10/17    Time  6    Period  Weeks    Status  On-going            Plan - 07/22/17 1423    Clinical Impression Statement  Pt required occasional cues to continue exercise (would stop mid exercise and hold particular motion). He tolerated all exercises well, without increase in pain.  SLS improving (performed with toe taps to 6" step).  With freq cues and increased time, pt able to  ambulate with less shuffling gait.   Progressing towards goals.     Rehab Potential  Good    PT Frequency  2x / week    PT Duration  6 weeks    PT Treatment/Interventions  Patient/family education;ADLs/Self Care Home Management;Cryotherapy;Electrical Stimulation;Iontophoresis 4mg /ml Dexamethasone;Moist Heat;Ultrasound;Dry needling;Manual techniques;Neuromuscular re-education;Therapeutic activities;Therapeutic exercise    PT Next Visit Plan  assess LE strength.  continue to work on gait and balance activities (with low rating in [place]).     Consulted and Agree with Plan of Care  Patient       Patient will benefit from skilled therapeutic intervention in order to improve the following deficits and impairments:  Postural dysfunction, Improper body mechanics, Pain, Increased muscle spasms, Increased fascial restricitons, Decreased mobility, Decreased range of motion, Decreased strength, Decreased activity tolerance, Abnormal gait  Visit Diagnosis: Other abnormalities of gait and mobility  Acute right-sided low back pain without sciatica  Other symptoms and signs involving the musculoskeletal system  Muscle weakness (generalized)     Problem List There are no active problems to display for this patient.  Kerin Perna, PTA 07/22/17 2:58 PM  [place], [place], [address] Phone: [phone]   Fax:  [phone]  Name: Jeffrey Porter MRN: [id number] Date of Birth: May 26, 1931

## 2017-07-29 ENCOUNTER — Encounter: Payer: Self-pay | Admitting: Rehabilitative and Restorative Service Providers"

## 2017-07-29 ENCOUNTER — Ambulatory Visit: Payer: Medicare HMO | Admitting: Rehabilitative and Restorative Service Providers"

## 2017-07-29 DIAGNOSIS — R29898 Other symptoms and signs involving the musculoskeletal system: Secondary | ICD-10-CM | POA: Diagnosis not present

## 2017-07-29 DIAGNOSIS — R2689 Other abnormalities of gait and mobility: Secondary | ICD-10-CM | POA: Diagnosis not present

## 2017-07-29 DIAGNOSIS — M545 Low back pain, unspecified: Secondary | ICD-10-CM

## 2017-07-29 DIAGNOSIS — M6281 Muscle weakness (generalized): Secondary | ICD-10-CM

## 2017-07-29 NOTE — Therapy (Addendum)
[facility] [place], [place], [address] Phone: [phone]   Fax:  [phone]  Physical Therapy Treatment  Patient Details  Name: Jeffrey Porter MRN: [id number] Date of Birth: May 31, 1931 Referring Provider: Dr Ophelia Charter; Dr [name]    Encounter Date: 07/29/2017  PT End of Session - 07/29/17 1452    Visit Number  5    Number of Visits  12    Date for PT Re-Evaluation  08/10/17    PT Start Time  1430    PT Stop Time  1528    PT Time Calculation (min)  58 min    Activity Tolerance  Patient tolerated treatment well       Past Medical History:  Diagnosis Date  . Glaucoma   . Prostate cancer [facility])     Past Surgical History:  Procedure Laterality Date  . CATARACT EXTRACTION    . HERNIA REPAIR      There were no vitals filed for this visit.  Subjective Assessment - 07/29/17 1447    Subjective  Some days are better than others with his walking and hip pain. Mornings are terrible - better later in the day.     Currently in Pain?  No/denies         [facility] PT Assessment - 07/29/17 0001      Assessment   Medical Diagnosis  LBP; LE weakness; shuffling gait     Referring Provider  Dr Ophelia Charter; Dr [name]     Onset Date/Surgical Date  03/26/17    Hand Dominance  Right    Next MD Visit  PRN     Prior Therapy  none for this condition       Strength   Right Hip Flexion  -- 5-/5    Right Hip ABduction  5/5    Left Hip Flexion  -- 5-/5    Left Hip ABduction  5/5                   [facility] Adult PT Treatment/Exercise - 07/29/17 0001      Ambulation/Gait   Gait Comments  working on gait along the hallway to increase the distances without changes in direction - shuffling gait pattern with initiation of gait; improved gait pattern wihen gait is established. -- carries single point cane which is too tall - minimal to no functional use of cane       Lumbar Exercises: Stretches   Lower  Trunk Rotation  -- 10 reps.     Piriformis Stretch  Right;Left;2 reps;30 seconds supine travell       Lumbar Exercises: Standing   Other Standing Lumbar Exercises  side steps at counter x 8 each direction; backward walking x 15 ft x 5 reps     Other Standing Lumbar Exercises  standing working on balance and alignment       Lumbar Exercises: Supine   Other Supine Lumbar Exercises  trunk rotation in supine - rotation of lower body side to side with 10 sec stretch; rotation of upper body reaching over to touch opposite hand turning head to look at hand with movement x 5 each direction       Lumbar Exercises: Sidelying   Other Sidelying Lumbar Exercises  upper trunk rotation (PWR twist) x 10 reps      Moist Heat Therapy   Number Minutes Moist Heat  15 Minutes    Moist Heat Location  Lumbar Spine;Hip Rt  Acupuncturist Location  Rt lumbar paraspinals    Electrical Stimulation Action  IFC    Electrical Stimulation Parameters  to tolerance    Electrical Stimulation Goals  Tone;Pain                  PT Long Term Goals - 07/29/17 1448      PT LONG TERM GOAL #1   Title  Improve posture and alignment with standing and walking 08/10/17    Time  6    Period  Weeks    Status  On-going      PT LONG TERM GOAL #2   Title  Increase strength bilat LE's to 5/5 with manual muscle testing 08/10/17    Time  6    Period  Weeks    Status  On-going      PT LONG TERM GOAL #3   Title  Improve Berg Balance scale score to indicated decrease fall risk 08/10/17    Time  6    Period  Weeks    Status  On-going      PT LONG TERM GOAL #4   Title  Improve gait pattern with improved initiation of gait thus improving gait function and gait 08/10/17    Time  6    Period  Weeks    Status  On-going      PT LONG TERM GOAL #5   Title  Independent in HEP 08/10/17    Time  6    Period  Weeks    Status  On-going            Plan - 07/29/17 1453    Clinical  Impression Statement  Patient remains stiff and tight through the lumbar spine and torso. He works well with therapist during visit. Motivated and anxious to improve. LE strength is increased. Gait is improved with longer distances. Backwards walking is smooth with minimal to no hesitant stepping. Side steps also improved compared to forward walking but not as smooth as backward steps.     Rehab Potential  Good    PT Frequency  2x / week    PT Duration  6 weeks    PT Treatment/Interventions  Patient/family education;ADLs/Self Care Home Management;Cryotherapy;Electrical Stimulation;Iontophoresis 72m/ml Dexamethasone;Moist Heat;Ultrasound;Dry needling;Manual techniques;Neuromuscular re-education;Therapeutic activities;Therapeutic exercise    PT Next Visit Plan  assess LE strength.  continue to work on gait and balance activities (with low rating in [place].     Consulted and Agree with Plan of Care  Patient       Patient will benefit from skilled therapeutic intervention in order to improve the following deficits and impairments:  Postural dysfunction, Improper body mechanics, Pain, Increased muscle spasms, Increased fascial restricitons, Decreased mobility, Decreased range of motion, Decreased strength, Decreased activity tolerance, Abnormal gait  Visit Diagnosis: Other abnormalities of gait and mobility  Acute right-sided low back pain without sciatica  Other symptoms and signs involving the musculoskeletal system  Muscle weakness (generalized)     Problem List There are no active problems to display for this patient.   [place] MPH  07/29/2017, 3:39 PM  [facility] [address] [address]Phone: [phone]  Fax:  [phone] Name: Jeffrey CerezoMRN: [id number] of Birth: 8Nov 04, 1933 PHYSICAL THERAPY DISCHARGE SUMMARY  Visits from Start of Care: 5  Current functional level related to goals /  functional outcomes: See last progress note for discharge  status   Remaining deficits: Unknown    Education / Equipment: HEP  Plan: Patient agrees to discharge.  Patient goals were not met. Patient is being discharged due to not returning since the last visit.  ?????     Jeffrey Porter P. Helene Kelp PT, MPH 09/01/17 2:39 PM

## 2017-08-08 DIAGNOSIS — E7800 Pure hypercholesterolemia, unspecified: Secondary | ICD-10-CM | POA: Diagnosis not present

## 2017-08-15 ENCOUNTER — Encounter: Payer: Medicare HMO | Admitting: Rehabilitative and Restorative Service Providers"

## 2017-09-01 DIAGNOSIS — R509 Fever, unspecified: Secondary | ICD-10-CM | POA: Diagnosis not present

## 2017-09-01 DIAGNOSIS — N390 Urinary tract infection, site not specified: Secondary | ICD-10-CM | POA: Diagnosis not present

## 2017-09-12 DIAGNOSIS — R7303 Prediabetes: Secondary | ICD-10-CM | POA: Diagnosis not present

## 2017-09-12 DIAGNOSIS — Z8546 Personal history of malignant neoplasm of prostate: Secondary | ICD-10-CM | POA: Diagnosis not present

## 2017-09-12 DIAGNOSIS — E7800 Pure hypercholesterolemia, unspecified: Secondary | ICD-10-CM | POA: Diagnosis not present

## 2017-09-12 DIAGNOSIS — Z6824 Body mass index (BMI) 24.0-24.9, adult: Secondary | ICD-10-CM | POA: Diagnosis not present

## 2017-09-12 DIAGNOSIS — R2689 Other abnormalities of gait and mobility: Secondary | ICD-10-CM | POA: Diagnosis not present

## 2017-09-12 DIAGNOSIS — D72829 Elevated white blood cell count, unspecified: Secondary | ICD-10-CM | POA: Diagnosis not present

## 2017-09-12 DIAGNOSIS — R3989 Other symptoms and signs involving the genitourinary system: Secondary | ICD-10-CM | POA: Diagnosis not present

## 2017-09-21 DIAGNOSIS — M150 Primary generalized (osteo)arthritis: Secondary | ICD-10-CM | POA: Diagnosis not present

## 2017-09-21 DIAGNOSIS — R634 Abnormal weight loss: Secondary | ICD-10-CM | POA: Diagnosis not present

## 2017-09-21 DIAGNOSIS — Z6824 Body mass index (BMI) 24.0-24.9, adult: Secondary | ICD-10-CM | POA: Diagnosis not present

## 2017-09-21 DIAGNOSIS — R2689 Other abnormalities of gait and mobility: Secondary | ICD-10-CM | POA: Diagnosis not present

## 2017-09-21 DIAGNOSIS — E7800 Pure hypercholesterolemia, unspecified: Secondary | ICD-10-CM | POA: Diagnosis not present

## 2017-09-21 DIAGNOSIS — Z8546 Personal history of malignant neoplasm of prostate: Secondary | ICD-10-CM | POA: Diagnosis not present

## 2017-09-21 DIAGNOSIS — Z87891 Personal history of nicotine dependence: Secondary | ICD-10-CM | POA: Diagnosis not present

## 2017-09-21 DIAGNOSIS — R7303 Prediabetes: Secondary | ICD-10-CM | POA: Diagnosis not present

## 2017-09-26 DIAGNOSIS — E7800 Pure hypercholesterolemia, unspecified: Secondary | ICD-10-CM | POA: Diagnosis not present

## 2017-09-26 DIAGNOSIS — Z6824 Body mass index (BMI) 24.0-24.9, adult: Secondary | ICD-10-CM | POA: Diagnosis not present

## 2017-09-26 DIAGNOSIS — R634 Abnormal weight loss: Secondary | ICD-10-CM | POA: Diagnosis not present

## 2017-09-26 DIAGNOSIS — R7303 Prediabetes: Secondary | ICD-10-CM | POA: Diagnosis not present

## 2017-09-26 DIAGNOSIS — Z8546 Personal history of malignant neoplasm of prostate: Secondary | ICD-10-CM | POA: Diagnosis not present

## 2017-09-26 DIAGNOSIS — R2689 Other abnormalities of gait and mobility: Secondary | ICD-10-CM | POA: Diagnosis not present

## 2017-09-26 DIAGNOSIS — Z87891 Personal history of nicotine dependence: Secondary | ICD-10-CM | POA: Diagnosis not present

## 2017-09-26 DIAGNOSIS — M150 Primary generalized (osteo)arthritis: Secondary | ICD-10-CM | POA: Diagnosis not present

## 2017-09-28 DIAGNOSIS — Z87891 Personal history of nicotine dependence: Secondary | ICD-10-CM | POA: Diagnosis not present

## 2017-09-28 DIAGNOSIS — R7303 Prediabetes: Secondary | ICD-10-CM | POA: Diagnosis not present

## 2017-09-28 DIAGNOSIS — R634 Abnormal weight loss: Secondary | ICD-10-CM | POA: Diagnosis not present

## 2017-09-28 DIAGNOSIS — R2689 Other abnormalities of gait and mobility: Secondary | ICD-10-CM | POA: Diagnosis not present

## 2017-09-28 DIAGNOSIS — Z8546 Personal history of malignant neoplasm of prostate: Secondary | ICD-10-CM | POA: Diagnosis not present

## 2017-09-28 DIAGNOSIS — M150 Primary generalized (osteo)arthritis: Secondary | ICD-10-CM | POA: Diagnosis not present

## 2017-09-28 DIAGNOSIS — E7800 Pure hypercholesterolemia, unspecified: Secondary | ICD-10-CM | POA: Diagnosis not present

## 2017-09-28 DIAGNOSIS — Z6824 Body mass index (BMI) 24.0-24.9, adult: Secondary | ICD-10-CM | POA: Diagnosis not present

## 2017-09-30 DIAGNOSIS — R2689 Other abnormalities of gait and mobility: Secondary | ICD-10-CM | POA: Diagnosis not present

## 2017-09-30 DIAGNOSIS — E7800 Pure hypercholesterolemia, unspecified: Secondary | ICD-10-CM | POA: Diagnosis not present

## 2017-09-30 DIAGNOSIS — Z8546 Personal history of malignant neoplasm of prostate: Secondary | ICD-10-CM | POA: Diagnosis not present

## 2017-09-30 DIAGNOSIS — R7303 Prediabetes: Secondary | ICD-10-CM | POA: Diagnosis not present

## 2017-09-30 DIAGNOSIS — R634 Abnormal weight loss: Secondary | ICD-10-CM | POA: Diagnosis not present

## 2017-09-30 DIAGNOSIS — M150 Primary generalized (osteo)arthritis: Secondary | ICD-10-CM | POA: Diagnosis not present

## 2017-09-30 DIAGNOSIS — Z6824 Body mass index (BMI) 24.0-24.9, adult: Secondary | ICD-10-CM | POA: Diagnosis not present

## 2017-09-30 DIAGNOSIS — Z87891 Personal history of nicotine dependence: Secondary | ICD-10-CM | POA: Diagnosis not present

## 2017-10-04 DIAGNOSIS — M150 Primary generalized (osteo)arthritis: Secondary | ICD-10-CM | POA: Diagnosis not present

## 2017-10-04 DIAGNOSIS — Z8546 Personal history of malignant neoplasm of prostate: Secondary | ICD-10-CM | POA: Diagnosis not present

## 2017-10-04 DIAGNOSIS — R634 Abnormal weight loss: Secondary | ICD-10-CM | POA: Diagnosis not present

## 2017-10-04 DIAGNOSIS — R2689 Other abnormalities of gait and mobility: Secondary | ICD-10-CM | POA: Diagnosis not present

## 2017-10-04 DIAGNOSIS — R7303 Prediabetes: Secondary | ICD-10-CM | POA: Diagnosis not present

## 2017-10-04 DIAGNOSIS — Z87891 Personal history of nicotine dependence: Secondary | ICD-10-CM | POA: Diagnosis not present

## 2017-10-04 DIAGNOSIS — E7800 Pure hypercholesterolemia, unspecified: Secondary | ICD-10-CM | POA: Diagnosis not present

## 2017-10-04 DIAGNOSIS — Z6824 Body mass index (BMI) 24.0-24.9, adult: Secondary | ICD-10-CM | POA: Diagnosis not present

## 2017-10-06 DIAGNOSIS — R634 Abnormal weight loss: Secondary | ICD-10-CM | POA: Diagnosis not present

## 2017-10-06 DIAGNOSIS — Z8546 Personal history of malignant neoplasm of prostate: Secondary | ICD-10-CM | POA: Diagnosis not present

## 2017-10-06 DIAGNOSIS — E7800 Pure hypercholesterolemia, unspecified: Secondary | ICD-10-CM | POA: Diagnosis not present

## 2017-10-06 DIAGNOSIS — M150 Primary generalized (osteo)arthritis: Secondary | ICD-10-CM | POA: Diagnosis not present

## 2017-10-06 DIAGNOSIS — R7303 Prediabetes: Secondary | ICD-10-CM | POA: Diagnosis not present

## 2017-10-06 DIAGNOSIS — R2689 Other abnormalities of gait and mobility: Secondary | ICD-10-CM | POA: Diagnosis not present

## 2017-10-06 DIAGNOSIS — Z87891 Personal history of nicotine dependence: Secondary | ICD-10-CM | POA: Diagnosis not present

## 2017-10-06 DIAGNOSIS — Z6824 Body mass index (BMI) 24.0-24.9, adult: Secondary | ICD-10-CM | POA: Diagnosis not present

## 2017-10-07 DIAGNOSIS — R7303 Prediabetes: Secondary | ICD-10-CM | POA: Diagnosis not present

## 2017-10-07 DIAGNOSIS — Z6824 Body mass index (BMI) 24.0-24.9, adult: Secondary | ICD-10-CM | POA: Diagnosis not present

## 2017-10-07 DIAGNOSIS — E7800 Pure hypercholesterolemia, unspecified: Secondary | ICD-10-CM | POA: Diagnosis not present

## 2017-10-07 DIAGNOSIS — R2689 Other abnormalities of gait and mobility: Secondary | ICD-10-CM | POA: Diagnosis not present

## 2017-10-07 DIAGNOSIS — R634 Abnormal weight loss: Secondary | ICD-10-CM | POA: Diagnosis not present

## 2017-10-07 DIAGNOSIS — Z87891 Personal history of nicotine dependence: Secondary | ICD-10-CM | POA: Diagnosis not present

## 2017-10-07 DIAGNOSIS — Z8546 Personal history of malignant neoplasm of prostate: Secondary | ICD-10-CM | POA: Diagnosis not present

## 2017-10-07 DIAGNOSIS — M150 Primary generalized (osteo)arthritis: Secondary | ICD-10-CM | POA: Diagnosis not present

## 2017-10-10 DIAGNOSIS — R7303 Prediabetes: Secondary | ICD-10-CM | POA: Diagnosis not present

## 2017-10-10 DIAGNOSIS — E7800 Pure hypercholesterolemia, unspecified: Secondary | ICD-10-CM | POA: Diagnosis not present

## 2017-10-10 DIAGNOSIS — M150 Primary generalized (osteo)arthritis: Secondary | ICD-10-CM | POA: Diagnosis not present

## 2017-10-10 DIAGNOSIS — R2689 Other abnormalities of gait and mobility: Secondary | ICD-10-CM | POA: Diagnosis not present

## 2017-10-10 DIAGNOSIS — Z8546 Personal history of malignant neoplasm of prostate: Secondary | ICD-10-CM | POA: Diagnosis not present

## 2017-10-10 DIAGNOSIS — Z6824 Body mass index (BMI) 24.0-24.9, adult: Secondary | ICD-10-CM | POA: Diagnosis not present

## 2017-10-10 DIAGNOSIS — R634 Abnormal weight loss: Secondary | ICD-10-CM | POA: Diagnosis not present

## 2017-10-10 DIAGNOSIS — Z87891 Personal history of nicotine dependence: Secondary | ICD-10-CM | POA: Diagnosis not present

## 2017-10-11 DIAGNOSIS — Z6824 Body mass index (BMI) 24.0-24.9, adult: Secondary | ICD-10-CM | POA: Diagnosis not present

## 2017-10-11 DIAGNOSIS — R7303 Prediabetes: Secondary | ICD-10-CM | POA: Diagnosis not present

## 2017-10-11 DIAGNOSIS — M150 Primary generalized (osteo)arthritis: Secondary | ICD-10-CM | POA: Diagnosis not present

## 2017-10-11 DIAGNOSIS — R634 Abnormal weight loss: Secondary | ICD-10-CM | POA: Diagnosis not present

## 2017-10-11 DIAGNOSIS — R2689 Other abnormalities of gait and mobility: Secondary | ICD-10-CM | POA: Diagnosis not present

## 2017-10-11 DIAGNOSIS — E7800 Pure hypercholesterolemia, unspecified: Secondary | ICD-10-CM | POA: Diagnosis not present

## 2017-10-11 DIAGNOSIS — Z87891 Personal history of nicotine dependence: Secondary | ICD-10-CM | POA: Diagnosis not present

## 2017-10-11 DIAGNOSIS — Z8546 Personal history of malignant neoplasm of prostate: Secondary | ICD-10-CM | POA: Diagnosis not present

## 2017-10-12 DIAGNOSIS — Z6824 Body mass index (BMI) 24.0-24.9, adult: Secondary | ICD-10-CM | POA: Diagnosis not present

## 2017-10-12 DIAGNOSIS — E7800 Pure hypercholesterolemia, unspecified: Secondary | ICD-10-CM | POA: Diagnosis not present

## 2017-10-12 DIAGNOSIS — R2689 Other abnormalities of gait and mobility: Secondary | ICD-10-CM | POA: Diagnosis not present

## 2017-10-12 DIAGNOSIS — M150 Primary generalized (osteo)arthritis: Secondary | ICD-10-CM | POA: Diagnosis not present

## 2017-10-12 DIAGNOSIS — R634 Abnormal weight loss: Secondary | ICD-10-CM | POA: Diagnosis not present

## 2017-10-12 DIAGNOSIS — Z87891 Personal history of nicotine dependence: Secondary | ICD-10-CM | POA: Diagnosis not present

## 2017-10-12 DIAGNOSIS — Z8546 Personal history of malignant neoplasm of prostate: Secondary | ICD-10-CM | POA: Diagnosis not present

## 2017-10-12 DIAGNOSIS — R7303 Prediabetes: Secondary | ICD-10-CM | POA: Diagnosis not present

## 2017-10-13 DIAGNOSIS — Z87891 Personal history of nicotine dependence: Secondary | ICD-10-CM | POA: Diagnosis not present

## 2017-10-13 DIAGNOSIS — M150 Primary generalized (osteo)arthritis: Secondary | ICD-10-CM | POA: Diagnosis not present

## 2017-10-13 DIAGNOSIS — E7800 Pure hypercholesterolemia, unspecified: Secondary | ICD-10-CM | POA: Diagnosis not present

## 2017-10-13 DIAGNOSIS — R634 Abnormal weight loss: Secondary | ICD-10-CM | POA: Diagnosis not present

## 2017-10-13 DIAGNOSIS — R7303 Prediabetes: Secondary | ICD-10-CM | POA: Diagnosis not present

## 2017-10-13 DIAGNOSIS — Z8546 Personal history of malignant neoplasm of prostate: Secondary | ICD-10-CM | POA: Diagnosis not present

## 2017-10-13 DIAGNOSIS — R2689 Other abnormalities of gait and mobility: Secondary | ICD-10-CM | POA: Diagnosis not present

## 2017-10-13 DIAGNOSIS — Z6824 Body mass index (BMI) 24.0-24.9, adult: Secondary | ICD-10-CM | POA: Diagnosis not present

## 2017-10-18 DIAGNOSIS — Z6824 Body mass index (BMI) 24.0-24.9, adult: Secondary | ICD-10-CM | POA: Diagnosis not present

## 2017-10-18 DIAGNOSIS — Z87891 Personal history of nicotine dependence: Secondary | ICD-10-CM | POA: Diagnosis not present

## 2017-10-18 DIAGNOSIS — R2689 Other abnormalities of gait and mobility: Secondary | ICD-10-CM | POA: Diagnosis not present

## 2017-10-18 DIAGNOSIS — Z8546 Personal history of malignant neoplasm of prostate: Secondary | ICD-10-CM | POA: Diagnosis not present

## 2017-10-18 DIAGNOSIS — R634 Abnormal weight loss: Secondary | ICD-10-CM | POA: Diagnosis not present

## 2017-10-18 DIAGNOSIS — R7303 Prediabetes: Secondary | ICD-10-CM | POA: Diagnosis not present

## 2017-10-18 DIAGNOSIS — M150 Primary generalized (osteo)arthritis: Secondary | ICD-10-CM | POA: Diagnosis not present

## 2017-10-18 DIAGNOSIS — E7800 Pure hypercholesterolemia, unspecified: Secondary | ICD-10-CM | POA: Diagnosis not present

## 2017-10-19 DIAGNOSIS — E7800 Pure hypercholesterolemia, unspecified: Secondary | ICD-10-CM | POA: Diagnosis not present

## 2017-10-19 DIAGNOSIS — R2689 Other abnormalities of gait and mobility: Secondary | ICD-10-CM | POA: Diagnosis not present

## 2017-10-19 DIAGNOSIS — Z87891 Personal history of nicotine dependence: Secondary | ICD-10-CM | POA: Diagnosis not present

## 2017-10-19 DIAGNOSIS — M150 Primary generalized (osteo)arthritis: Secondary | ICD-10-CM | POA: Diagnosis not present

## 2017-10-19 DIAGNOSIS — R634 Abnormal weight loss: Secondary | ICD-10-CM | POA: Diagnosis not present

## 2017-10-19 DIAGNOSIS — Z8546 Personal history of malignant neoplasm of prostate: Secondary | ICD-10-CM | POA: Diagnosis not present

## 2017-10-19 DIAGNOSIS — Z6824 Body mass index (BMI) 24.0-24.9, adult: Secondary | ICD-10-CM | POA: Diagnosis not present

## 2017-10-19 DIAGNOSIS — R7303 Prediabetes: Secondary | ICD-10-CM | POA: Diagnosis not present

## 2017-10-24 DIAGNOSIS — M150 Primary generalized (osteo)arthritis: Secondary | ICD-10-CM | POA: Diagnosis not present

## 2017-10-24 DIAGNOSIS — R634 Abnormal weight loss: Secondary | ICD-10-CM | POA: Diagnosis not present

## 2017-10-24 DIAGNOSIS — R2689 Other abnormalities of gait and mobility: Secondary | ICD-10-CM | POA: Diagnosis not present

## 2017-10-24 DIAGNOSIS — Z8546 Personal history of malignant neoplasm of prostate: Secondary | ICD-10-CM | POA: Diagnosis not present

## 2017-10-24 DIAGNOSIS — Z6824 Body mass index (BMI) 24.0-24.9, adult: Secondary | ICD-10-CM | POA: Diagnosis not present

## 2017-10-24 DIAGNOSIS — R7303 Prediabetes: Secondary | ICD-10-CM | POA: Diagnosis not present

## 2017-10-24 DIAGNOSIS — E7800 Pure hypercholesterolemia, unspecified: Secondary | ICD-10-CM | POA: Diagnosis not present

## 2017-10-24 DIAGNOSIS — Z87891 Personal history of nicotine dependence: Secondary | ICD-10-CM | POA: Diagnosis not present

## 2017-10-28 DIAGNOSIS — Z6824 Body mass index (BMI) 24.0-24.9, adult: Secondary | ICD-10-CM | POA: Diagnosis not present

## 2017-10-28 DIAGNOSIS — R634 Abnormal weight loss: Secondary | ICD-10-CM | POA: Diagnosis not present

## 2017-10-28 DIAGNOSIS — E7800 Pure hypercholesterolemia, unspecified: Secondary | ICD-10-CM | POA: Diagnosis not present

## 2017-10-28 DIAGNOSIS — M150 Primary generalized (osteo)arthritis: Secondary | ICD-10-CM | POA: Diagnosis not present

## 2017-10-28 DIAGNOSIS — Z8546 Personal history of malignant neoplasm of prostate: Secondary | ICD-10-CM | POA: Diagnosis not present

## 2017-10-28 DIAGNOSIS — Z87891 Personal history of nicotine dependence: Secondary | ICD-10-CM | POA: Diagnosis not present

## 2017-10-28 DIAGNOSIS — R2689 Other abnormalities of gait and mobility: Secondary | ICD-10-CM | POA: Diagnosis not present

## 2017-10-28 DIAGNOSIS — R7303 Prediabetes: Secondary | ICD-10-CM | POA: Diagnosis not present

## 2017-10-31 DIAGNOSIS — R634 Abnormal weight loss: Secondary | ICD-10-CM | POA: Diagnosis not present

## 2017-10-31 DIAGNOSIS — Z87891 Personal history of nicotine dependence: Secondary | ICD-10-CM | POA: Diagnosis not present

## 2017-10-31 DIAGNOSIS — Z8546 Personal history of malignant neoplasm of prostate: Secondary | ICD-10-CM | POA: Diagnosis not present

## 2017-10-31 DIAGNOSIS — R7303 Prediabetes: Secondary | ICD-10-CM | POA: Diagnosis not present

## 2017-10-31 DIAGNOSIS — R2689 Other abnormalities of gait and mobility: Secondary | ICD-10-CM | POA: Diagnosis not present

## 2017-10-31 DIAGNOSIS — Z6824 Body mass index (BMI) 24.0-24.9, adult: Secondary | ICD-10-CM | POA: Diagnosis not present

## 2017-10-31 DIAGNOSIS — E7800 Pure hypercholesterolemia, unspecified: Secondary | ICD-10-CM | POA: Diagnosis not present

## 2017-10-31 DIAGNOSIS — M150 Primary generalized (osteo)arthritis: Secondary | ICD-10-CM | POA: Diagnosis not present

## 2017-11-21 DIAGNOSIS — R69 Illness, unspecified: Secondary | ICD-10-CM | POA: Diagnosis not present

## 2017-11-23 DIAGNOSIS — H4010X0 Unspecified open-angle glaucoma, stage unspecified: Secondary | ICD-10-CM | POA: Diagnosis not present

## 2017-11-23 DIAGNOSIS — M25562 Pain in left knee: Secondary | ICD-10-CM | POA: Diagnosis not present

## 2017-11-23 DIAGNOSIS — I350 Nonrheumatic aortic (valve) stenosis: Secondary | ICD-10-CM | POA: Diagnosis not present

## 2017-11-23 DIAGNOSIS — M25561 Pain in right knee: Secondary | ICD-10-CM | POA: Diagnosis not present

## 2017-11-23 DIAGNOSIS — R2689 Other abnormalities of gait and mobility: Secondary | ICD-10-CM | POA: Diagnosis not present

## 2017-11-23 DIAGNOSIS — Z6824 Body mass index (BMI) 24.0-24.9, adult: Secondary | ICD-10-CM | POA: Diagnosis not present

## 2017-11-23 DIAGNOSIS — Z86010 Personal history of colonic polyps: Secondary | ICD-10-CM | POA: Diagnosis not present

## 2017-11-23 DIAGNOSIS — Z8546 Personal history of malignant neoplasm of prostate: Secondary | ICD-10-CM | POA: Diagnosis not present

## 2017-11-23 DIAGNOSIS — Z1389 Encounter for screening for other disorder: Secondary | ICD-10-CM | POA: Diagnosis not present

## 2017-11-23 DIAGNOSIS — Z0000 Encounter for general adult medical examination without abnormal findings: Secondary | ICD-10-CM | POA: Diagnosis not present

## 2017-12-06 ENCOUNTER — Emergency Department (HOSPITAL_COMMUNITY): Payer: Medicare HMO

## 2017-12-06 ENCOUNTER — Encounter (HOSPITAL_COMMUNITY): Payer: Self-pay

## 2017-12-06 ENCOUNTER — Other Ambulatory Visit: Payer: Self-pay

## 2017-12-06 ENCOUNTER — Inpatient Hospital Stay (HOSPITAL_COMMUNITY)
Admission: EM | Admit: 2017-12-06 | Discharge: 2017-12-15 | DRG: 871 | Disposition: A | Discharge status: Skilled Nursing Facility | Payer: Medicare HMO | Attending: Internal Medicine | Admitting: Internal Medicine

## 2017-12-06 DIAGNOSIS — E860 Dehydration: Secondary | ICD-10-CM | POA: Diagnosis present

## 2017-12-06 DIAGNOSIS — A419 Sepsis, unspecified organism: Secondary | ICD-10-CM | POA: Diagnosis not present

## 2017-12-06 DIAGNOSIS — A4159 Other Gram-negative sepsis: Principal | ICD-10-CM | POA: Diagnosis present

## 2017-12-06 DIAGNOSIS — M1990 Unspecified osteoarthritis, unspecified site: Secondary | ICD-10-CM | POA: Diagnosis present

## 2017-12-06 DIAGNOSIS — J9811 Atelectasis: Secondary | ICD-10-CM | POA: Diagnosis present

## 2017-12-06 DIAGNOSIS — D72829 Elevated white blood cell count, unspecified: Secondary | ICD-10-CM | POA: Diagnosis present

## 2017-12-06 DIAGNOSIS — R945 Abnormal results of liver function studies: Secondary | ICD-10-CM

## 2017-12-06 DIAGNOSIS — M6281 Muscle weakness (generalized): Secondary | ICD-10-CM | POA: Diagnosis not present

## 2017-12-06 DIAGNOSIS — Z79899 Other long term (current) drug therapy: Secondary | ICD-10-CM

## 2017-12-06 DIAGNOSIS — N3000 Acute cystitis without hematuria: Secondary | ICD-10-CM

## 2017-12-06 DIAGNOSIS — J9601 Acute respiratory failure with hypoxia: Secondary | ICD-10-CM | POA: Diagnosis not present

## 2017-12-06 DIAGNOSIS — W19XXXA Unspecified fall, initial encounter: Secondary | ICD-10-CM | POA: Diagnosis not present

## 2017-12-06 DIAGNOSIS — S299XXA Unspecified injury of thorax, initial encounter: Secondary | ICD-10-CM | POA: Diagnosis not present

## 2017-12-06 DIAGNOSIS — R112 Nausea with vomiting, unspecified: Secondary | ICD-10-CM | POA: Diagnosis present

## 2017-12-06 DIAGNOSIS — R41841 Cognitive communication deficit: Secondary | ICD-10-CM | POA: Diagnosis not present

## 2017-12-06 DIAGNOSIS — Z87891 Personal history of nicotine dependence: Secondary | ICD-10-CM

## 2017-12-06 DIAGNOSIS — R278 Other lack of coordination: Secondary | ICD-10-CM | POA: Diagnosis not present

## 2017-12-06 DIAGNOSIS — R7881 Bacteremia: Secondary | ICD-10-CM | POA: Diagnosis not present

## 2017-12-06 DIAGNOSIS — Z8546 Personal history of malignant neoplasm of prostate: Secondary | ICD-10-CM

## 2017-12-06 DIAGNOSIS — I471 Supraventricular tachycardia: Secondary | ICD-10-CM | POA: Diagnosis not present

## 2017-12-06 DIAGNOSIS — R7401 Elevation of levels of liver transaminase levels: Secondary | ICD-10-CM

## 2017-12-06 DIAGNOSIS — J180 Bronchopneumonia, unspecified organism: Secondary | ICD-10-CM | POA: Diagnosis not present

## 2017-12-06 DIAGNOSIS — Z9849 Cataract extraction status, unspecified eye: Secondary | ICD-10-CM | POA: Diagnosis not present

## 2017-12-06 DIAGNOSIS — R000 Tachycardia, unspecified: Secondary | ICD-10-CM | POA: Diagnosis present

## 2017-12-06 DIAGNOSIS — K760 Fatty (change of) liver, not elsewhere classified: Secondary | ICD-10-CM | POA: Diagnosis not present

## 2017-12-06 DIAGNOSIS — R1110 Vomiting, unspecified: Secondary | ICD-10-CM | POA: Diagnosis not present

## 2017-12-06 DIAGNOSIS — E876 Hypokalemia: Secondary | ICD-10-CM | POA: Diagnosis present

## 2017-12-06 DIAGNOSIS — N390 Urinary tract infection, site not specified: Secondary | ICD-10-CM | POA: Diagnosis not present

## 2017-12-06 DIAGNOSIS — E785 Hyperlipidemia, unspecified: Secondary | ICD-10-CM | POA: Diagnosis present

## 2017-12-06 DIAGNOSIS — I248 Other forms of acute ischemic heart disease: Secondary | ICD-10-CM | POA: Diagnosis present

## 2017-12-06 DIAGNOSIS — M545 Low back pain: Secondary | ICD-10-CM | POA: Diagnosis not present

## 2017-12-06 DIAGNOSIS — Y95 Nosocomial condition: Secondary | ICD-10-CM | POA: Diagnosis present

## 2017-12-06 DIAGNOSIS — I350 Nonrheumatic aortic (valve) stenosis: Secondary | ICD-10-CM | POA: Diagnosis present

## 2017-12-06 DIAGNOSIS — B961 Klebsiella pneumoniae [K. pneumoniae] as the cause of diseases classified elsewhere: Secondary | ICD-10-CM | POA: Diagnosis not present

## 2017-12-06 DIAGNOSIS — R740 Nonspecific elevation of levels of transaminase and lactic acid dehydrogenase [LDH]: Secondary | ICD-10-CM

## 2017-12-06 DIAGNOSIS — I10 Essential (primary) hypertension: Secondary | ICD-10-CM | POA: Diagnosis present

## 2017-12-06 DIAGNOSIS — R0603 Acute respiratory distress: Secondary | ICD-10-CM

## 2017-12-06 DIAGNOSIS — M6282 Rhabdomyolysis: Secondary | ICD-10-CM | POA: Diagnosis present

## 2017-12-06 DIAGNOSIS — H409 Unspecified glaucoma: Secondary | ICD-10-CM | POA: Diagnosis present

## 2017-12-06 DIAGNOSIS — M549 Dorsalgia, unspecified: Secondary | ICD-10-CM | POA: Diagnosis not present

## 2017-12-06 DIAGNOSIS — I361 Nonrheumatic tricuspid (valve) insufficiency: Secondary | ICD-10-CM | POA: Diagnosis not present

## 2017-12-06 DIAGNOSIS — W010XXA Fall on same level from slipping, tripping and stumbling without subsequent striking against object, initial encounter: Secondary | ICD-10-CM | POA: Diagnosis not present

## 2017-12-06 DIAGNOSIS — R079 Chest pain, unspecified: Secondary | ICD-10-CM | POA: Diagnosis not present

## 2017-12-06 DIAGNOSIS — R6521 Severe sepsis with septic shock: Secondary | ICD-10-CM | POA: Diagnosis not present

## 2017-12-06 LAB — CBC WITH DIFFERENTIAL/PLATELET
BASOS ABS: 0 10*3/uL (ref 0.0–0.1)
BASOS PCT: 0 %
EOS ABS: 0 10*3/uL (ref 0.0–0.7)
EOS PCT: 0 %
HCT: 40.8 % (ref 39.0–52.0)
Hemoglobin: 14.8 g/dL (ref 13.0–17.0)
LYMPHS PCT: 1 %
Lymphs Abs: 0.3 10*3/uL — ABNORMAL LOW (ref 0.7–4.0)
MCH: 31 pg (ref 26.0–34.0)
MCHC: 36.3 g/dL — ABNORMAL HIGH (ref 30.0–36.0)
MCV: 85.5 fL (ref 78.0–100.0)
MONO ABS: 0.9 10*3/uL (ref 0.1–1.0)
Monocytes Relative: 4 %
Neutro Abs: 21.1 10*3/uL — ABNORMAL HIGH (ref 1.7–7.7)
Neutrophils Relative %: 95 %
PLATELETS: 287 10*3/uL (ref 150–400)
RBC: 4.77 MIL/uL (ref 4.22–5.81)
RDW: 12.9 % (ref 11.5–15.5)
WBC: 22.3 10*3/uL — AB (ref 4.0–10.5)

## 2017-12-06 LAB — COMPREHENSIVE METABOLIC PANEL
ALBUMIN: 4.4 g/dL (ref 3.5–5.0)
ALT: 667 U/L — AB (ref 0–44)
AST: 423 U/L — AB (ref 15–41)
Alkaline Phosphatase: 99 U/L (ref 38–126)
Anion gap: 12 (ref 5–15)
BUN: 28 mg/dL — AB (ref 8–23)
CHLORIDE: 103 mmol/L (ref 98–111)
CO2: 25 mmol/L (ref 22–32)
CREATININE: 0.83 mg/dL (ref 0.61–1.24)
Calcium: 9.6 mg/dL (ref 8.9–10.3)
GFR calc Af Amer: >60 mL/min (ref >60)
GFR calc non Af Amer: >60 mL/min (ref >60)
Glucose, Bld: 130 mg/dL — ABNORMAL HIGH (ref 70–99)
POTASSIUM: 4 mmol/L (ref 3.5–5.1)
SODIUM: 140 mmol/L (ref 135–145)
Total Bilirubin: 5.5 mg/dL — ABNORMAL HIGH (ref 0.3–1.2)
Total Protein: 6.9 g/dL (ref 6.5–8.1)

## 2017-12-06 LAB — I-STAT CG4 LACTIC ACID, ED: Lactic Acid, Venous: 3.48 mmol/L (ref 0.5–1.9)

## 2017-12-06 LAB — CK: Total CK: 1430 U/L — ABNORMAL HIGH (ref 49–397)

## 2017-12-06 MED ORDER — LACTATED RINGERS IV BOLUS
1000.0000 mL | Freq: Once | INTRAVENOUS | Status: AC
  Administered 2017-12-06: 1000 mL via INTRAVENOUS

## 2017-12-06 MED ORDER — ACETAMINOPHEN 650 MG RE SUPP: 650.0000 mg | Freq: Four times a day (QID) | RECTAL | Status: DC | PRN

## 2017-12-06 MED ORDER — CEFTRIAXONE SODIUM 1 G IJ SOLR
1.0000 g | Freq: Once | INTRAMUSCULAR | Status: AC
  Administered 2017-12-06: 1 g via INTRAVENOUS
  Filled 2017-12-06: qty 10

## 2017-12-06 MED ORDER — ENOXAPARIN SODIUM 40 MG/0.4ML ~~LOC~~ SOLN
40.0000 mg | Freq: Every day | SUBCUTANEOUS | Status: DC
  Administered 2017-12-07 – 2017-12-14 (×9): 40 mg via SUBCUTANEOUS
  Filled 2017-12-06 (×9): qty 0.4

## 2017-12-06 MED ORDER — ONDANSETRON HCL 4 MG/2ML IJ SOLN
4.0000 mg | Freq: Once | INTRAMUSCULAR | Status: DC
  Filled 2017-12-06: qty 2

## 2017-12-06 MED ORDER — LACTATED RINGERS IV SOLN: INTRAVENOUS | Status: DC

## 2017-12-06 MED ORDER — ACETAMINOPHEN 325 MG PO TABS
650.0000 mg | ORAL_TABLET | Freq: Four times a day (QID) | ORAL | Status: DC | PRN
  Administered 2017-12-07 – 2017-12-12 (×6): 650 mg via ORAL
  Filled 2017-12-06 (×6): qty 2

## 2017-12-06 MED ORDER — SODIUM CHLORIDE 0.9 % IV SOLN
INTRAVENOUS | Status: DC
  Administered 2017-12-07: 01:00:00 via INTRAVENOUS

## 2017-12-06 NOTE — H&P (Signed)
TRH H&P   Patient Demographics:    Jeffrey Porter, is a 84 y.o. male  MRN: [id number]   DOB - 03-19-1932  Admit Date - 12/06/2017  Outpatient Primary MD for the patient is [name], MD  Referring MD/NP/PA:  Lennice Sites  Outpatient Specialists:     Patient coming from:home  Chief Complaint  Patient presents with  . Weakness  . Fever      HPI:    Jeffrey Porter  is a 84 y.o. male, wh/o prostate cancer apparently felt at home and was down for about 12 hours.  Pt notes that he has had increase in frequency of urination and fever since yesterday and felt weak in general.  Pt was brought to [facility] urgent care and sent to ER for evaluation of ? Rhabdo, and UTI.   In ED,  Lactic acid 3.48=> 4.09 CPK 1,430 Wbc 22.3, Hgb 14.8, Plt 287 Na 140, K 4.0,  Bun 28, Creatinine 0.83 Ast 423, Alt 667, Alk phos 99, T. Bili 5.5   CXR IMPRESSION: 1. No acute cardiopulmonary process. 2.  Aortic Atherosclerosis (ICD10-I70.0).  Pt will be admitted for sepsis, (tachycardia, elevation in lactic acid, leukocytosis) secondary to UTI, abnormal liver function likely secondary to rhabdomyolysis     Review of systems:    In addition to the HPI above,  No Fever-chills, No Headache, No changes with Vision or hearing, No problems swallowing food or Liquids, No Chest pain, Cough or Shortness of Breath, No Blood in stool or Urine, No dysuria, No new skin rashes or bruises, No new joints pains-aches,  No new weakness, tingling, numbness in any extremity, No recent weight gain or loss, No polyuria, polydypsia or polyphagia, No significant Mental Stressors.  A full 10 point Review of Systems was done, except as stated above, all other Review of Systems were negative.   With Past History of the following :    Past Medical History:  Diagnosis Date  . Glaucoma   . Prostate cancer [facility])      Past Surgical History:  Procedure Laterality Date  . CATARACT EXTRACTION    . HERNIA REPAIR        Social History:     Social History   Tobacco Use  . Smoking status: Former Smoker    Types: Cigarettes  . Smokeless tobacco: Never Used  Substance Use Topics  . Alcohol use: Yes    Alcohol/week: 1.0 standard drinks    Types: 1 Cans of beer per week     Lives - at home  Mobility - walks by self   Family History :     Family History  Problem Relation Age of Onset  . Cancer Mother   . Cancer Father       Home Medications:   Prior to Admission medications   Medication Sig Start Date End Date Taking? Authorizing Provider  acetaminophen (  TYLENOL) 500 MG tablet Take 500 mg by mouth daily as needed for moderate pain.   Yes [provider]  Cholecalciferol (VITAMIN D) 2000 units tablet Take 2,000 Units by mouth daily.   Yes [provider]  latanoprost (XALATAN) 0.005 % ophthalmic solution Place 1 drop into both eyes at bedtime.    Yes [provider]  LUTEIN-ZEAXANTHIN PO Take 1 tablet by mouth daily.   Yes [provider]  Magnesium 250 MG TABS Take 250 mg by mouth daily.   Yes [provider]  meloxicam (MOBIC) 7.5 MG tablet Take 7.5 mg by mouth daily as needed for pain. 11/23/17  Yes [provider]  Multiple Vitamin (MULTIVITAMIN WITH MINERALS) TABS tablet Take 1 tablet by mouth daily.   Yes [provider]  niacin 100 MG tablet Take 100 mg by mouth at bedtime.   Yes [provider]     Allergies:    No Known Allergies   Physical Exam:   Vitals  Blood pressure 120/63, pulse 94, temperature 98.6 F (37 C), temperature source Oral, resp. rate 18, height 5' 2" (1.575 m), weight 61.7 kg, SpO2 98 %.   1. General  lying in bed in NAD,  Speaking spanish  2. Normal affect and insight, Not Suicidal or Homicidal, Awake Alert, Oriented X 3.  3. No F.N deficits, ALL C.Nerves Intact, Strength 5/5  all 4 extremities, Sensation intact all 4 extremities, Plantars down going.  4. Ears and Eyes appear Normal, Conjunctivae clear, PERRLA. Moist Oral Mucosa.  5. Supple Neck, No JVD, No cervical lymphadenopathy appriciated, No Carotid Bruits.  6. Symmetrical Chest wall movement, Good air movement bilaterally, CTAB.  7. Tachy s1, s2, no m/g/r  8. Positive Bowel Sounds, Abdomen Soft, No tenderness, No organomegaly appriciated,No rebound -guarding or rigidity.  9.  No Cyanosis, Normal Skin Turgor, No Skin Rash or Bruise.  10. Good muscle tone,  joints appear normal , no effusions, Normal ROM.  11. No Palpable Lymph Nodes in Neck or Axillae     Data Review:    CBC Recent Labs  Lab 12/06/17 2207  WBC 22.3*  HGB 14.8  HCT 40.8  PLT 287  MCV 85.5  MCH 31.0  MCHC 36.3*  RDW 12.9  LYMPHSABS 0.3*  MONOABS 0.9  EOSABS 0.0  BASOSABS 0.0   ------------------------------------------------------------------------------------------------------------------  Chemistries  Recent Labs  Lab 12/06/17 2207  NA 140  K 4.0  CL 103  CO2 25  GLUCOSE 130*  BUN 28*  CREATININE 0.83  CALCIUM 9.6  AST 423*  ALT 667*  ALKPHOS 99  BILITOT 5.5*   ------------------------------------------------------------------------------------------------------------------ estimated creatinine clearance is 52.1 mL/min (by C-G formula based on SCr of 0.83 mg/dL). ------------------------------------------------------------------------------------------------------------------ Recent Labs    12/06/17 2333  TSH 0.861    Coagulation profile No results for input(s): INR, PROTIME in the last 168 hours. ------------------------------------------------------------------------------------------------------------------- No results for input(s): DDIMER in the last 72 hours. -------------------------------------------------------------------------------------------------------------------  Cardiac  Enzymes Recent Labs  Lab 12/06/17 2333  TROPONINI 0.58*   ------------------------------------------------------------------------------------------------------------------ No results found for: BNP   ---------------------------------------------------------------------------------------------------------------  Urinalysis No results found for: COLORURINE, APPEARANCEUR, LABSPEC, [place], GLUCOSEU, HGBUR, BILIRUBINUR, KETONESUR, PROTEINUR, UROBILINOGEN, NITRITE, LEUKOCYTESUR  ----------------------------------------------------------------------------------------------------------------   Imaging Results:    Dg Chest 2 View  Result Date: 12/06/2017 CLINICAL DATA:  [place] this morning. Weakness and fever. Leukocytosis. History of prostate cancer. EXAM: CHEST - 2 VIEW COMPARISON:  None. FINDINGS: Cardiomediastinal silhouette is normal. Calcified aortic arch. No pleural effusions or focal consolidations. Trachea  projects midline and there is no pneumothorax. Soft tissue planes and included osseous structures are non-suspicious. Mild degenerative change of the thoracic spine. Asymmetrically prominent RIGHT chest wall soft tissue may be positional, recommend correlation with physical examination. IMPRESSION: 1. No acute cardiopulmonary process. 2.  Aortic Atherosclerosis (ICD10-I70.0). Electronically Signed   By: Elon Alas M.D.   On: 12/06/2017 22:12       Assessment & Plan:    Principal Problem:   Sepsis ([place]) Active Problems:   Acute lower UTI   Tachycardia   Leukocytosis    Sepsis secondary to UTI Blood culture x2 pending Await urine culture Start Rocephin 1gm iv qday  Abnormal liver function  ? Secondary to rhabdomyolysis Check RUQ ultrasound Check acute hepatitis panel Discontinue Niacin  Rhabdomyolysis secondary to trauma of fall and being down for 12 hours.  Hydrate with ns iv at 11m per hour Check cpk, mb, esr, tsh,  In am  Tachycardia Tele 12 lead  EKG Trop iq 6h x3 Check d dimer, if positive then CTA chest r/o PE Check Tsh Check cardiac echo  Cardiac murmer/  H/o aortic stenosis and aortic regurgitation Check cardiac echo  Glaucoma Cont latanoprost   Osteoarthritis Cont meloxicam prn    DVT Prophylaxis Lovenox - SCDs  AM Labs Ordered, also please review Full Orders  Family Communication: Admission, patients condition and plan of care including tests being ordered have been discussed with the patient  who indicate understanding and agree with the plan and Code Status.  Code Status  FULL CODE  Likely DC to  home  Condition GUARDED    Consults called:    none  Admission status: inpatient, pt is septic and severely ill and has rhabdomyolysis secondary to trauma and requires iv fluid and iv abx . He will require at least 2 days of the above which will necessitate inpatient admission  Time spent in minutes : 70   JJani GravelM.D on 12/07/2017 at 1:19 AM  Between 7am to 7pm - Pager - [phone] . After 7pm go to [email or web address] - password [email or web address] - Office  [phone]

## 2017-12-06 NOTE — ED Notes (Signed)
ED TO INPATIENT HANDOFF REPORT  Name/Age/Gender Jeffrey Porter 84 y.o. male  Code Status   Home/SNF/Other Home  Chief Complaint Abnormal labs, [facility]  Level of Care/Admitting Diagnosis ED Disposition    ED Disposition Condition [place]: [place] [[id number]]  Level of Care: Telemetry [5]  Admit to tele based on following criteria: Other see comments  Comments: tachycardia  Diagnosis: Sepsis [facility]) [[id number]]  Admitting Physician: Jani Gravel [[id number]]  Attending Physician: Jani Gravel [id number]  Estimated length of stay: past midnight tomorrow  Certification:: I certify this patient will need inpatient services for at least 2 midnights  PT Class (Do Not Modify): Inpatient [101]  PT Acc Code (Do Not Modify): Private [1]       Medical History Past Medical History:  Diagnosis Date  . Glaucoma   . Prostate cancer ([place])     Allergies No Known Allergies  IV Location/Drains/Wounds Patient Lines/Drains/Airways Status   Active Line/Drains/Airways    Name:   Placement date:   Placement time:   Site:   Days:   Peripheral IV 12/06/17 Right Forearm   12/06/17    2153    Forearm   less than 1   Peripheral IV 12/06/17 Right Forearm   12/06/17    2150    Forearm   less than 1          Labs/Imaging Results for orders placed or performed during the hospital encounter of 12/06/17 (from the past 48 hour(s))  CBC with Differential     Status: Abnormal   Collection Time: 12/06/17 10:07 PM  Result Value Ref Range   WBC 22.3 (H) 4.0 - 10.5 K/uL   RBC 4.77 4.22 - 5.81 MIL/uL   Hemoglobin 14.8 13.0 - 17.0 g/dL   HCT 40.8 39.0 - 52.0 %   MCV 85.5 78.0 - 100.0 fL   MCH 31.0 26.0 - 34.0 pg   MCHC 36.3 (H) 30.0 - 36.0 g/dL   RDW 12.9 11.5 - 15.5 %   Platelets 287 150 - 400 K/uL   Neutrophils Relative % 95 %   Neutro Abs 21.1 (H) 1.7 - 7.7 K/uL   Lymphocytes Relative 1 %   Lymphs Abs 0.3 (L) 0.7 - 4.0 K/uL   Monocytes  Relative 4 %   Monocytes Absolute 0.9 0.1 - 1.0 K/uL   Eosinophils Relative 0 %   Eosinophils Absolute 0.0 0.0 - 0.7 K/uL   Basophils Relative 0 %   Basophils Absolute 0.0 0.0 - 0.1 K/uL    Comment: Performed at [facility], [place] [address] [place], [place] [address]  I-Stat CG4 Lactic Acid, ED     Status: Abnormal   Collection Time: 12/06/17 10:16 PM  Result Value Ref Range   Lactic Acid, Venous 3.48 (HH) 0.5 - 1.9 mmol/L   Comment NOTIFIED PHYSICIAN    Dg Chest 2 View  Result Date: 12/06/2017 CLINICAL DATA:  [place] this morning. Weakness and fever. Leukocytosis. History of prostate cancer. EXAM: CHEST - 2 VIEW COMPARISON:  None. FINDINGS: Cardiomediastinal silhouette is normal. Calcified aortic arch. No pleural effusions or focal consolidations. Trachea projects midline and there is no pneumothorax. Soft tissue planes and included osseous structures are non-suspicious. Mild degenerative change of the thoracic spine. Asymmetrically prominent RIGHT chest wall soft tissue may be positional, recommend correlation with physical examination. IMPRESSION: 1. No acute cardiopulmonary process. 2.  Aortic Atherosclerosis (ICD10-I70.0). Electronically Signed   By: Sandie Ano  Bloomer M.D.   On: 12/06/2017 22:12    Pending Labs Unresulted Labs (From admission, onward)    Start     Ordered   12/06/17 2155  Blood culture (routine x 2)  BLOOD CULTURE X 2,   STAT     12/06/17 2154   12/06/17 2135  CK  STAT,   STAT     12/06/17 2134   12/06/17 2134  Comprehensive metabolic panel  STAT,   STAT     12/06/17 2134   12/06/17 2134  Urine culture  STAT,   STAT     12/06/17 2134          Vitals/Pain Today's Vitals   12/06/17 2115 12/06/17 2117 12/06/17 2202 12/06/17 2230  BP: (!) 118/58  (!) 107/51 129/61  Pulse: (!) 101  93 94  Resp: 16  19 20   Temp: 98.7 F (37.1 C)     TempSrc: Oral     SpO2: 98%  99% 100%  Weight:  61.7 kg    Height:  5\' 2"  (1.575 m)    PainSc:  5        Isolation Precautions No active isolations  Medications Medications  ondansetron (ZOFRAN) injection 4 mg (4 mg Intravenous Refused 12/06/17 2205)  lactated ringers bolus 1,000 mL (0 mLs Intravenous Stopped 12/06/17 2259)  cefTRIAXone (ROCEPHIN) 1 g in sodium chloride 0.9 % 100 mL IVPB (0 g Intravenous Stopped 12/06/17 2234)    Mobility walks with device

## 2017-12-06 NOTE — ED Triage Notes (Signed)
Pt arrives from [place] PCP. Pt had a fall this morning + weakness + fever + back pain. At PCP pt had blood in urine and WBC >20.

## 2017-12-06 NOTE — ED Notes (Signed)
ED provider [place] made aware patient has critical lactic value of 3.48

## 2017-12-06 NOTE — ED Provider Notes (Signed)
[facility] DEPT Provider Note   CSN: [id number] Arrival date & time: 12/06/17  2102     History   Chief Complaint Chief Complaint  Patient presents with  . Weakness  . Fever    HPI [name] is a 84 y.o. male.  The history is provided by the patient.  Fever   This is a new problem. The current episode started 12 to 24 hours ago. The problem occurs hourly. The problem has been gradually improving. The maximum temperature noted was 102 to 102.9 F. Associated symptoms include muscle aches. Pertinent negatives include no chest pain, no fussiness, no diarrhea, no vomiting, no congestion, no sore throat and no cough. Associated symptoms comments: nausea. He has tried acetaminophen for the symptoms. The treatment provided mild relief.    Past Medical History:  Diagnosis Date  . Glaucoma   . Prostate cancer [facility])     Patient Active Problem List   Diagnosis Date Noted  . Sepsis ([place]) 12/06/2017  . Acute lower UTI 12/06/2017  . Tachycardia 12/06/2017  . Leukocytosis 12/06/2017    Past Surgical History:  Procedure Laterality Date  . CATARACT EXTRACTION    . HERNIA REPAIR          Home Medications    Prior to Admission medications   Medication Sig Start Date End Date Taking? Authorizing Provider  acetaminophen (TYLENOL) 500 MG tablet Take 500 mg by mouth daily as needed for moderate pain.   Yes [provider]  Cholecalciferol (VITAMIN D) 2000 units tablet Take 2,000 Units by mouth daily.   Yes [provider]  latanoprost (XALATAN) 0.005 % ophthalmic solution Place 1 drop into both eyes at bedtime.    Yes [provider]  LUTEIN-ZEAXANTHIN PO Take 1 tablet by mouth daily.   Yes [provider]  Magnesium 250 MG TABS Take 250 mg by mouth daily.   Yes [provider]  meloxicam (MOBIC) 7.5 MG tablet Take 7.5 mg by mouth daily as needed for pain. 11/23/17  Yes [provider]  Multiple  Vitamin (MULTIVITAMIN WITH MINERALS) TABS tablet Take 1 tablet by mouth daily.   Yes [provider]  niacin 100 MG tablet Take 100 mg by mouth at bedtime.   Yes [provider]    Family History History reviewed. No pertinent family history.  Social History Social History   Tobacco Use  . Smoking status: Former Smoker    Types: Cigarettes  . Smokeless tobacco: Never Used  Substance Use Topics  . Alcohol use: Yes    Alcohol/week: 1.0 standard drinks    Types: 1 Cans of beer per week  . Drug use: Not on file     Allergies   Patient has no known allergies.   Review of Systems Review of Systems  Constitutional: Positive for fatigue and fever. Negative for chills.  HENT: Negative for congestion, ear pain and sore throat.   Eyes: Negative for pain and visual disturbance.  Respiratory: Negative for cough and shortness of breath.   Cardiovascular: Negative for chest pain and palpitations.  Gastrointestinal: Positive for nausea. Negative for abdominal pain, blood in stool, diarrhea and vomiting.  Genitourinary: Negative for dysuria and hematuria.  Musculoskeletal: Positive for back pain and gait problem (baseline). Negative for arthralgias.  Skin: Negative for color change and rash.  Neurological: Positive for weakness. Negative for dizziness, seizures and syncope.  All other systems reviewed and are negative.    Physical Exam Updated Vital Signs  ED Triage Vitals  Enc Vitals Group     BP 12/06/17 2115 (!) 118/58     Pulse Rate 12/06/17 2115 (!) 101     Resp 12/06/17 2115 16     Temp 12/06/17 2115 98.7 F (37.1 C)     Temp Source 12/06/17 2115 Oral     SpO2 12/06/17 2115 98 %     Weight 12/06/17 2117 136 lb (61.7 kg)     Height 12/06/17 2117 5\' 2"  (1.575 m)     Head Circumference --      Peak Flow --      Pain Score 12/06/17 2117 5     Pain Loc --      Pain Edu? --      Excl. in [place]? --     Physical Exam  Constitutional: He is oriented to  person, place, and time. He appears well-developed and well-nourished.  HENT:  Head: Normocephalic and atraumatic.  Mouth/Throat: No oropharyngeal exudate.  Eyes: Pupils are equal, round, and reactive to light. Conjunctivae and EOM are normal.  Neck: Normal range of motion. Neck supple.  Cardiovascular: Normal rate, regular rhythm, normal heart sounds and intact distal pulses.  No murmur heard. Pulmonary/Chest: Effort normal and breath sounds normal. No stridor. No respiratory distress. He has no wheezes. He has no rales.  Abdominal: Soft. He exhibits no distension. There is no tenderness.  Musculoskeletal: Normal range of motion. He exhibits no edema or tenderness.  No midline spinal tenderness  Neurological: He is alert and oriented to person, place, and time. No cranial nerve deficit or sensory deficit. He exhibits normal muscle tone. Coordination normal.  5+/5 strength and normal sensation throughout, no drift  Skin: Skin is warm and dry. Capillary refill takes less than 2 seconds.  Psychiatric: He has a normal mood and affect.  Nursing note and vitals reviewed.    ED Treatments / Results  Labs (all labs ordered are listed, but only abnormal results are displayed) Labs Reviewed  CBC WITH DIFFERENTIAL/PLATELET - Abnormal; Notable for the following components:      Result Value   WBC 22.3 (*)    MCHC 36.3 (*)    Neutro Abs 21.1 (*)    Lymphs Abs 0.3 (*)    All other components within normal limits  COMPREHENSIVE METABOLIC PANEL - Abnormal; Notable for the following components:   Glucose, Bld 130 (*)    BUN 28 (*)    AST 423 (*)    ALT 667 (*)    Total Bilirubin 5.5 (*)    All other components within normal limits  CK - Abnormal; Notable for the following components:   Total CK 1,430 (*)    All other components within normal limits  I-STAT CG4 LACTIC ACID, ED - Abnormal; Notable for the following components:   Lactic Acid, Venous 3.48 (*)    All other components within  normal limits  URINE CULTURE  CULTURE, BLOOD (ROUTINE X 2)  CULTURE, BLOOD (ROUTINE X 2)  HEPATITIS PANEL, ACUTE  COMPREHENSIVE METABOLIC PANEL  CBC  TSH  TROPONIN I  TROPONIN I  TROPONIN I  CK TOTAL AND CKMB (NOT AT [facility])  I-STAT CG4 LACTIC ACID, ED    EKG None  Radiology Dg Chest 2 View  Result Date: 12/06/2017 CLINICAL DATA:  [place] this morning. Weakness and fever. Leukocytosis. History of prostate cancer. EXAM: CHEST - 2 VIEW COMPARISON:  None. FINDINGS: Cardiomediastinal silhouette is normal. Calcified aortic arch. No pleural effusions or focal consolidations.  Trachea projects midline and there is no pneumothorax. Soft tissue planes and included osseous structures are non-suspicious. Mild degenerative change of the thoracic spine. Asymmetrically prominent RIGHT chest wall soft tissue may be positional, recommend correlation with physical examination. IMPRESSION: 1. No acute cardiopulmonary process. 2.  Aortic Atherosclerosis (ICD10-I70.0). Electronically Signed   By: Elon Alas M.D.   On: 12/06/2017 22:12    Procedures Procedures (including critical care time)  Medications Ordered in ED Medications  ondansetron (ZOFRAN) injection 4 mg (4 mg Intravenous Refused 12/06/17 2205)  enoxaparin (LOVENOX) injection 40 mg (has no administration in time range)  0.9 %  sodium chloride infusion (has no administration in time range)  acetaminophen (TYLENOL) tablet 650 mg (has no administration in time range)    Or  acetaminophen (TYLENOL) suppository 650 mg (has no administration in time range)  lactated ringers bolus 1,000 mL (0 mLs Intravenous Stopped 12/06/17 2259)  cefTRIAXone (ROCEPHIN) 1 g in sodium chloride 0.9 % 100 mL IVPB (0 g Intravenous Stopped 12/06/17 2234)     Initial Impression / Assessment and Plan / ED Course  I have reviewed the triage vital signs and the nursing notes.  Pertinent labs & imaging results that were available during my care of the patient were  reviewed by me and considered in my medical decision making (see chart for details).     Jeffrey Porter is an 84 year old male with history of prostate cancer in remission who presents to the ED with concern for urinary tract infection from clinic.  Patient mildly tachycardic upon arrival but otherwise normal vitals and no fever.  Patient did have a 102 fever this morning at home.  Patient states that he felt nauseous, fever, chills last night and while walking in his bathroom he fell over and spent the night sleeping on the floor.  Patient denies any loss of consciousness, head injury.  Has some pain in his low back that is now resolved.  Had outpatient work-up at primary care clinic tonight that showed a urinary tract infection with a white count of 23.  Patient had positive nitrites and leukocytes.  Urinalysis is uploaded in patient's medical chart and was reviewed by me.  Patient had chest x-ray and lumbar x-ray that were unremarkable.  Patient was sent for evaluation due to urosepsis.  Patient arrives here overall well-appearing but still feels nauseous.  Patient with no abdominal pain.  No midline spinal tenderness.  Blood cultures, urine culture, basic labs re-drawn. Patient given fluid bolus, IV Rocephin, repeat chest x-ray.  Suspect patient likely with sepsis from urinary tract infection.  Patient also given IV Zofran. CK sent as concern for rhambo given prolong stay on floor.  Patient with persistent leukocytosis of 23.  Lactic acidosis of 3.4.  Patient, however, has responded well to fluids and no concern for septic shock at this time.  Patient with transaminitis, elevation of CK, elevation of bilirubin likely secondary to dehydration and rhabdomyolysis from decreased fluid intake over the last 24 hours.  Patient started on IV maintenance fluids.  Patient with no abdominal pain on exam and at this time doubt hepatobiliary process. Patient to be admitted to hospitalist service for further care.   Hemodynamically stable throughout my care.  Final Clinical Impressions(s) / ED Diagnoses   Final diagnoses:  Acute cystitis without hematuria  Sepsis, due to unspecified organism [facility])  Transaminitis  Non-traumatic rhabdomyolysis    ED Discharge Orders    None       Lennice Sites, DO  12/06/17 2319  

## 2017-12-06 NOTE — ED Notes (Signed)
Admitting Provider at bedside. 

## 2017-12-06 NOTE — ED Notes (Signed)
Bed: VK18 Expected date:  Expected time:  Means of arrival:  Comments: Hold for [place]-from [facility] walk in clinic with urosepsis and rhabdo

## 2017-12-06 NOTE — ED Notes (Signed)
Patient is out of room in xray at this time

## 2017-12-07 ENCOUNTER — Encounter (HOSPITAL_COMMUNITY): Payer: Self-pay | Admitting: Internal Medicine

## 2017-12-07 ENCOUNTER — Inpatient Hospital Stay (HOSPITAL_COMMUNITY): Payer: Medicare HMO

## 2017-12-07 DIAGNOSIS — I361 Nonrheumatic tricuspid (valve) insufficiency: Secondary | ICD-10-CM

## 2017-12-07 DIAGNOSIS — I350 Nonrheumatic aortic (valve) stenosis: Secondary | ICD-10-CM

## 2017-12-07 LAB — COMPREHENSIVE METABOLIC PANEL
ALT: 505 U/L — AB (ref 0–44)
AST: 253 U/L — AB (ref 15–41)
Albumin: 3.7 g/dL (ref 3.5–5.0)
Alkaline Phosphatase: 84 U/L (ref 38–126)
Anion gap: 9 (ref 5–15)
BILIRUBIN TOTAL: 5.8 mg/dL — AB (ref 0.3–1.2)
BUN: 20 mg/dL (ref 8–23)
CO2: 26 mmol/L (ref 22–32)
CREATININE: 0.65 mg/dL (ref 0.61–1.24)
Calcium: 8.9 mg/dL (ref 8.9–10.3)
Chloride: 105 mmol/L (ref 98–111)
GFR calc Af Amer: >60 mL/min (ref >60)
Glucose, Bld: 103 mg/dL — ABNORMAL HIGH (ref 70–99)
Potassium: 3.7 mmol/L (ref 3.5–5.1)
Sodium: 140 mmol/L (ref 135–145)
TOTAL PROTEIN: 6.1 g/dL — AB (ref 6.5–8.1)

## 2017-12-07 LAB — TSH: TSH: 0.861 u[IU]/mL (ref 0.350–4.500)

## 2017-12-07 LAB — PROTIME-INR
INR: 1.8
Prothrombin Time: 20.7 seconds — ABNORMAL HIGH (ref 11.4–15.2)

## 2017-12-07 LAB — BLOOD CULTURE ID PANEL (REFLEXED)
Acinetobacter baumannii: NOT DETECTED
CANDIDA GLABRATA: NOT DETECTED
CANDIDA KRUSEI: NOT DETECTED
CANDIDA PARAPSILOSIS: NOT DETECTED
Candida albicans: NOT DETECTED
Candida tropicalis: NOT DETECTED
Carbapenem resistance: NOT DETECTED
ENTEROCOCCUS SPECIES: NOT DETECTED
ESCHERICHIA COLI: NOT DETECTED
Enterobacter cloacae complex: NOT DETECTED
Enterobacteriaceae species: DETECTED — AB
Haemophilus influenzae: NOT DETECTED
KLEBSIELLA OXYTOCA: NOT DETECTED
KLEBSIELLA PNEUMONIAE: DETECTED — AB
LISTERIA MONOCYTOGENES: NOT DETECTED
Neisseria meningitidis: NOT DETECTED
Proteus species: NOT DETECTED
Pseudomonas aeruginosa: NOT DETECTED
SERRATIA MARCESCENS: NOT DETECTED
STAPHYLOCOCCUS SPECIES: NOT DETECTED
STREPTOCOCCUS PNEUMONIAE: NOT DETECTED
STREPTOCOCCUS PYOGENES: NOT DETECTED
Staphylococcus aureus (BCID): NOT DETECTED
Streptococcus agalactiae: NOT DETECTED
Streptococcus species: NOT DETECTED

## 2017-12-07 LAB — TROPONIN I
Troponin I: 0.58 ng/mL (ref <0.03)
Troponin I: 0.66 ng/mL (ref <0.03)
Troponin I: 0.53 ng/mL (ref <0.03)

## 2017-12-07 LAB — CG4 I-STAT (LACTIC ACID): LACTIC ACID, VENOUS: 4.09 mmol/L — AB (ref 0.5–1.9)

## 2017-12-07 LAB — CBC
HCT: 36.6 % — ABNORMAL LOW (ref 39.0–52.0)
Hemoglobin: 13 g/dL (ref 13.0–17.0)
MCH: 30.4 pg (ref 26.0–34.0)
MCHC: 35.5 g/dL (ref 30.0–36.0)
MCV: 85.7 fL (ref 78.0–100.0)
PLATELETS: 241 10*3/uL (ref 150–400)
RBC: 4.27 MIL/uL (ref 4.22–5.81)
RDW: 13.3 % (ref 11.5–15.5)
WBC: 14.1 10*3/uL — AB (ref 4.0–10.5)

## 2017-12-07 LAB — ECHOCARDIOGRAM COMPLETE
Height: 62 in
WEIGHTICAEL: 2176 [oz_av]

## 2017-12-07 LAB — APTT: aPTT: 49 seconds — ABNORMAL HIGH (ref 24–36)

## 2017-12-07 LAB — CK TOTAL AND CKMB (NOT AT ARMC)
CK, MB: 9.5 ng/mL — ABNORMAL HIGH (ref 0.5–5.0)
RELATIVE INDEX: 0.6 (ref 0.0–2.5)
Total CK: 1505 U/L — ABNORMAL HIGH (ref 49–397)

## 2017-12-07 LAB — D-DIMER, QUANTITATIVE (NOT AT ARMC): D DIMER QUANT: 6.21 ug{FEU}/mL — AB (ref 0.00–0.50)

## 2017-12-07 MED ORDER — SODIUM CHLORIDE 0.9 % IV SOLN
1.0000 g | INTRAVENOUS | Status: DC
  Filled 2017-12-07: qty 10

## 2017-12-07 MED ORDER — NIACIN 100 MG PO TABS
100.0000 mg | ORAL_TABLET | Freq: Every day | ORAL | Status: DC
  Administered 2017-12-07: 100 mg via ORAL
  Filled 2017-12-07: qty 1

## 2017-12-07 MED ORDER — HYOSCYAMINE SULFATE 0.125 MG SL SUBL
0.2500 mg | SUBLINGUAL_TABLET | Freq: Once | SUBLINGUAL | Status: AC
  Administered 2017-12-08: 0.25 mg via SUBLINGUAL
  Filled 2017-12-07: qty 2

## 2017-12-07 MED ORDER — ALUM & MAG HYDROXIDE-SIMETH 200-200-20 MG/5ML PO SUSP
15.0000 mL | Freq: Four times a day (QID) | ORAL | Status: DC | PRN
  Administered 2017-12-07: 15 mL via ORAL
  Filled 2017-12-07: qty 30

## 2017-12-07 MED ORDER — CEFTRIAXONE SODIUM 2 G IJ SOLR
2.0000 g | Freq: Every day | INTRAMUSCULAR | Status: DC
  Administered 2017-12-07: 2 g via INTRAVENOUS
  Filled 2017-12-07: qty 20
  Filled 2017-12-07: qty 2

## 2017-12-07 MED ORDER — ADULT MULTIVITAMIN W/MINERALS CH
1.0000 | ORAL_TABLET | Freq: Every day | ORAL | Status: DC
  Administered 2017-12-07 – 2017-12-15 (×9): 1 via ORAL
  Filled 2017-12-07 (×9): qty 1

## 2017-12-07 MED ORDER — LUTEIN-ZEAXANTHIN 15-0.7 MG PO CAPS: ORAL_CAPSULE | Freq: Every day | ORAL | Status: DC

## 2017-12-07 MED ORDER — LATANOPROST 0.005 % OP SOLN
1.0000 [drp] | Freq: Every day | OPHTHALMIC | Status: DC
  Administered 2017-12-07 – 2017-12-14 (×8): 1 [drp] via OPHTHALMIC
  Filled 2017-12-07 (×2): qty 2.5

## 2017-12-07 MED ORDER — IOPAMIDOL (ISOVUE-370) INJECTION 76%
100.0000 mL | Freq: Once | INTRAVENOUS | Status: AC | PRN
  Administered 2017-12-07: 100 mL via INTRAVENOUS

## 2017-12-07 MED ORDER — HYDROCODONE-ACETAMINOPHEN 5-325 MG PO TABS
1.0000 | ORAL_TABLET | Freq: Once | ORAL | Status: AC
  Administered 2017-12-08: 1 via ORAL
  Filled 2017-12-07 (×2): qty 1

## 2017-12-07 MED ORDER — ONDANSETRON HCL 4 MG/2ML IJ SOLN
4.0000 mg | Freq: Four times a day (QID) | INTRAMUSCULAR | Status: DC | PRN
  Administered 2017-12-07: 4 mg via INTRAVENOUS
  Filled 2017-12-07: qty 2

## 2017-12-07 MED ORDER — MELOXICAM 7.5 MG PO TABS
7.5000 mg | ORAL_TABLET | Freq: Every day | ORAL | Status: DC | PRN
  Administered 2017-12-13 – 2017-12-15 (×3): 7.5 mg via ORAL
  Filled 2017-12-07 (×6): qty 1

## 2017-12-07 MED ORDER — IOPAMIDOL (ISOVUE-370) INJECTION 76%
INTRAVENOUS | Status: AC
  Filled 2017-12-07: qty 100

## 2017-12-07 MED ORDER — SODIUM CHLORIDE 0.9 % IV SOLN
INTRAVENOUS | Status: DC
  Administered 2017-12-08: 01:00:00 via INTRAVENOUS

## 2017-12-07 MED ORDER — MAGNESIUM OXIDE 400 (241.3 MG) MG PO TABS
200.0000 mg | ORAL_TABLET | Freq: Every day | ORAL | Status: DC
  Administered 2017-12-07 – 2017-12-15 (×9): 200 mg via ORAL
  Filled 2017-12-07 (×9): qty 1

## 2017-12-07 MED ORDER — VITAMIN D 1000 UNITS PO TABS
2000.0000 [IU] | ORAL_TABLET | Freq: Every day | ORAL | Status: DC
  Administered 2017-12-07 – 2017-12-15 (×9): 2000 [IU] via ORAL
  Filled 2017-12-07 (×9): qty 2

## 2017-12-07 NOTE — Progress Notes (Signed)
PHARMACY - PHYSICIAN COMMUNICATION CRITICAL VALUE ALERT - BLOOD CULTURE IDENTIFICATION (BCID)  Jeffrey Porter is an 84 y.o. male who presented to [facility] on 12/06/2017 with a chief complaint of fall  Assessment: 3/4 bottles with K pneumo (likely urinary source  Name of physician (or Provider) ContactedMaylene Roes  Current antibiotics: Rocephin 1g q24  Changes to prescribed antibiotics recommended:  Increase Rocephin to 2g IV q24 hr  Results for orders placed or performed during the hospital encounter of 12/06/17  Blood Culture ID Panel (Reflexed) (Collected: 12/06/2017 10:06 PM)  Result Value Ref Range   Enterococcus species NOT DETECTED NOT DETECTED   Listeria monocytogenes NOT DETECTED NOT DETECTED   Staphylococcus species NOT DETECTED NOT DETECTED   Staphylococcus aureus NOT DETECTED NOT DETECTED   Streptococcus species NOT DETECTED NOT DETECTED   Streptococcus agalactiae NOT DETECTED NOT DETECTED   Streptococcus pneumoniae NOT DETECTED NOT DETECTED   Streptococcus pyogenes NOT DETECTED NOT DETECTED   Acinetobacter baumannii NOT DETECTED NOT DETECTED   Enterobacteriaceae species DETECTED (A) NOT DETECTED   Enterobacter cloacae complex NOT DETECTED NOT DETECTED   Escherichia coli NOT DETECTED NOT DETECTED   Klebsiella oxytoca NOT DETECTED NOT DETECTED   Klebsiella pneumoniae DETECTED (A) NOT DETECTED   Proteus species NOT DETECTED NOT DETECTED   Serratia marcescens NOT DETECTED NOT DETECTED   Carbapenem resistance NOT DETECTED NOT DETECTED   Haemophilus influenzae NOT DETECTED NOT DETECTED   Neisseria meningitidis NOT DETECTED NOT DETECTED   Pseudomonas aeruginosa NOT DETECTED NOT DETECTED   Candida albicans NOT DETECTED NOT DETECTED   Candida glabrata NOT DETECTED NOT DETECTED   Candida krusei NOT DETECTED NOT DETECTED   Candida parapsilosis NOT DETECTED NOT DETECTED   Candida tropicalis NOT DETECTED NOT DETECTED    [name] A 12/07/2017  3:39 PM

## 2017-12-07 NOTE — Progress Notes (Signed)
PROGRESS NOTE    Jeffrey Porter  KZS:[id number] DOB: [birth year] DOA: 12/06/2017 PCP: [name], MD     Brief Narrative:  Jeffrey Porter is a 84 yo male with past medical history significant for hyperlipidemia, cataract who presents after fall at home.  He states that he went to the restroom to vomit, then while getting up, he lost his balance and fell.  He denies any loss of consciousness.  He states that it took him about 12 hours to finally be able to get up.  He lives alone.  He apparently went to urgent care, was sent then to the emergency department.  In the emergency department, his CK was 1430, lactic acid 3.48, WBC 22.3.  He was apparently diagnosed with a urinary tract infection at urgent care.  New events last 24 hours / Subjective: No acute events, complains of some right shoulder soreness.  Denies any chest pain, shortness of breath.  Assessment & Plan:   Principal Problem:   Sepsis ([place]) Active Problems:   Acute lower UTI   Tachycardia   Leukocytosis  Fall at home without loss of consciousness -PT OT evaluation  Rhabdomyolysis -Continue to trend CK, IV fluids  Elevated transaminase -Likely secondary to dehydration -LFT trending down -Right upper quadrant ultrasound negative -Acute hepatitis panel pending  Sepsis secondary to UTI, present on admission -I am unable to locate urinalysis result.  Per ED physician documentation, patient was diagnosed with UTI at urgent care -Urine culture, blood culture pending -Continue Rocephin  Elevated troponin -Likely secondary to demand ischemia, patient currently denies any chest pain -Check echocardiogram -Trend troponin  Elevated ddimer -Negative CTA for PE    DVT prophylaxis: Lovenox  Code Status: Full code Family Communication: No family at bedside Disposition Plan: Pending clinical improvement   Consultants:   None  Procedures:   None   Antimicrobials:  Anti-infectives (From admission, onward)   Start      Dose/Rate Route Frequency Ordered Stop   12/07/17 2200  cefTRIAXone (ROCEPHIN) 1 g in sodium chloride 0.9 % 100 mL IVPB     1 g 200 mL/hr over 30 Minutes Intravenous Every 24 hours 12/07/17 0131     12/06/17 2145  cefTRIAXone (ROCEPHIN) 1 g in sodium chloride 0.9 % 100 mL IVPB     1 g 200 mL/hr over 30 Minutes Intravenous  Once 12/06/17 2134 12/06/17 2234       Objective: Vitals:   12/07/17 0015 12/07/17 0610 12/07/17 0950 12/07/17 1028  BP: 120/63 (!) 104/52    Pulse: 94 100    Resp: 18 (!) 22    Temp: 98.6 F (37 C) 99.4 F (37.4 C) 99.3 F (37.4 C) 98.4 F (36.9 C)  TempSrc: Oral Oral Oral Oral  SpO2: 98% 94%    Weight:      Height:        Intake/Output Summary (Last 24 hours) at 12/07/2017 1253 Last data filed at 12/07/2017 0900 Gross per 24 hour  Intake 240 ml  Output -  Net 240 ml   Filed Weights   12/06/17 2117  Weight: 61.7 kg    Examination:  General exam: Appears calm and comfortable  Respiratory system: Clear to auscultation. Respiratory effort normal. Cardiovascular system: S1 & S2 heard, regular rhythm, tachycardic. +3 systolic murmur Gastrointestinal system: Abdomen is nondistended, soft and nontender. No organomegaly or masses felt. Normal bowel sounds heard. Central nervous system: Alert and oriented. No focal neurological deficits. Extremities: Symmetric 5 x 5 power. Skin: No rashes,  lesions or ulcers Psychiatry: Judgement and insight appear normal. Mood & affect appropriate.   Data Reviewed: I have personally reviewed following labs and imaging studies  CBC: Recent Labs  Lab 12/06/17 2207 12/07/17 0557  WBC 22.3* 14.1*  NEUTROABS 21.1*  --   HGB 14.8 13.0  HCT 40.8 36.6*  MCV 85.5 85.7  PLT 287 161   Basic Metabolic Panel: Recent Labs  Lab 12/06/17 2207 12/07/17 0557  NA 140 140  K 4.0 3.7  CL 103 105  CO2 25 26  GLUCOSE 130* 103*  BUN 28* 20  CREATININE 0.83 0.65  CALCIUM 9.6 8.9   GFR: Estimated Creatinine Clearance:  54 mL/min (by C-G formula based on SCr of 0.65 mg/dL). Liver Function Tests: Recent Labs  Lab 12/06/17 2207 12/07/17 0557  AST 423* 253*  ALT 667* 505*  ALKPHOS 99 84  BILITOT 5.5* 5.8*  PROT 6.9 6.1*  ALBUMIN 4.4 3.7   No results for input(s): LIPASE, AMYLASE in the last 168 hours. No results for input(s): AMMONIA in the last 168 hours. Coagulation Profile: Recent Labs  Lab 12/07/17 0557  INR 1.80   Cardiac Enzymes: Recent Labs  Lab 12/06/17 2207 12/06/17 2333 12/07/17 0557  CKTOTAL 1,430*  --  1,505*  CKMB  --   --  9.5*  TROPONINI  --  0.58* 0.66*   BNP (last 3 results) No results for input(s): PROBNP in the last 8760 hours. HbA1C: No results for input(s): HGBA1C in the last 72 hours. CBG: No results for input(s): GLUCAP in the last 168 hours. Lipid Profile: No results for input(s): CHOL, HDL, LDLCALC, TRIG, CHOLHDL, LDLDIRECT in the last 72 hours. Thyroid Function Tests: Recent Labs    12/06/17 2333  TSH 0.861   Anemia Panel: No results for input(s): VITAMINB12, FOLATE, FERRITIN, TIBC, IRON, RETICCTPCT in the last 72 hours. Sepsis Labs: Recent Labs  Lab 12/06/17 2216 12/07/17 0015  LATICACIDVEN 3.48* 4.09*    Recent Results (from the past 240 hour(s))  Blood culture (routine x 2)     Status: None (Preliminary result)   Collection Time: 12/06/17 10:06 PM  Result Value Ref Range Status   Specimen Description   Final    BLOOD LEFT WRIST Performed at [facility], [address], [place], [place] [address]    Special Requests   Final    BOTTLES DRAWN AEROBIC AND ANAEROBIC Blood Culture results may not be optimal due to an inadequate volume of blood received in culture bottles Performed at [facility] [address], [place], [place] [address]    Culture PENDING  Incomplete   Report Status PENDING  Incomplete       Radiology Studies: Dg Chest 2 View  Result Date: 12/06/2017 CLINICAL DATA:  [place] this morning.  Weakness and fever. Leukocytosis. History of prostate cancer. EXAM: CHEST - 2 VIEW COMPARISON:  None. FINDINGS: Cardiomediastinal silhouette is normal. Calcified aortic arch. No pleural effusions or focal consolidations. Trachea projects midline and there is no pneumothorax. Soft tissue planes and included osseous structures are non-suspicious. Mild degenerative change of the thoracic spine. Asymmetrically prominent RIGHT chest wall soft tissue may be positional, recommend correlation with physical examination. IMPRESSION: 1. No acute cardiopulmonary process. 2.  Aortic Atherosclerosis (ICD10-I70.0). Electronically Signed   By: Elon Alas M.D.   On: 12/06/2017 22:12   Ct Angio Chest Pe W Or Wo Contrast  Result Date: 12/07/2017 CLINICAL DATA:  Fall, down for 12 hours. Fever and back pain. Leukocytosis.  History of prostate cancer. EXAM: CT ANGIOGRAPHY CHEST WITH CONTRAST TECHNIQUE: Multidetector CT imaging of the chest was performed using the standard protocol during bolus administration of intravenous contrast. Multiplanar CT image reconstructions and MIPs were obtained to evaluate the vascular anatomy. CONTRAST:  115mL ISOVUE-370 IOPAMIDOL (ISOVUE-370) INJECTION 76% COMPARISON:  Chest radiograph December 06, 2017 FINDINGS: CARDIOVASCULAR: Adequate contrast opacification of the pulmonary artery's. Main pulmonary artery is not enlarged. No pulmonary arterial filling defects to the level of the subsegmental branches. Heart size is normal. Mild coronary artery calcification. No pericardial effusion. Thoracic aorta is normal course and caliber, moderate calcific atherosclerosis and intimal thickening. MEDIASTINUM/NODES: No lymphadenopathy by CT size criteria. LUNGS/PLEURA: Tracheobronchial tree is patent, no pneumothorax. No pleural effusions, focal consolidations, pulmonary nodules or masses. Dependent atelectasis. UPPER ABDOMEN: Nonacute. Subcentimeter hypodensity in segment 4, potential cyst. MUSCULOSKELETAL:  Nonacute. Mild degenerative change of the thoracic spine. Review of the MIP images confirms the above findings. IMPRESSION: 1. No acute pulmonary embolism nor acute cardiopulmonary process. Aortic Atherosclerosis (ICD10-I70.0). Electronically Signed   By: Elon Alas M.D.   On: 12/07/2017 04:53   US Abdomen Limited Ruq  Result Date: 12/07/2017 CLINICAL DATA:  Abnormal liver enzymes EXAM: ULTRASOUND ABDOMEN LIMITED RIGHT UPPER QUADRANT COMPARISON:  None. FINDINGS: Gallbladder: No gallstones or wall thickening visualized. There is no pericholecystic fluid. No sonographic Murphy sign noted by sonographer. Common bile duct: Diameter: 6 mm. No intrahepatic or extrahepatic biliary duct dilatation. Liver: No focal lesion identified. Within normal limits in parenchymal echogenicity. Portal vein is patent on color Doppler imaging with normal direction of blood flow towards the liver. IMPRESSION: Study within normal limits. Electronically Signed   By: Lowella Grip III M.D.   On: 12/07/2017 10:10      Scheduled Meds: . cholecalciferol  2,000 Units Oral Daily  . enoxaparin (LOVENOX) injection  40 mg Subcutaneous QHS  . iopamidol      . latanoprost  1 drop Both Eyes QHS  . magnesium oxide  200 mg Oral Daily  . multivitamin with minerals  1 tablet Oral Daily  . ondansetron (ZOFRAN) IV  4 mg Intravenous Once   Continuous Infusions: . sodium chloride    . cefTRIAXone (ROCEPHIN)  IV       LOS: 1 day    Time spent: 35 minutes   Dessa Phi, DO [facility] [email or web address] Password [facility] 12/07/2017, 12:53 PM

## 2017-12-07 NOTE — Progress Notes (Signed)
Pt arrived to unit via stretcher room 1517. Alert and oriented x3. VS taken. Pt oriented to room and callbell with no complications. Pt guide at the bedside. No complaint of pain.will continue to monitor.

## 2017-12-07 NOTE — Evaluation (Signed)
Physical Therapy Evaluation Patient Details Name: Jeffrey Porter MRN: [id number] DOB: 1932/03/06 Today's Date: 12/07/2017   History of Present Illness  Braedan Porter is a 84 yo male with past medical history significant for hyperlipidemia, cataract who presents after fall at home, on floor overnight. Admitted with UTI  Clinical Impression  After ambulating 50' with min assist and no AD, the patient began to demonstrate significant festinating gait which worsened to point that the patient really was unable to ambulate with RW nor HHA, nor pushing a cart.  The patient reports that this occurs intermittently. An extraordinary amount of time  Was required to ambulate x 150'.No family present to  Provide information  In regards to abnormal gait function PTA. Pt admitted with above diagnosis. Pt currently with functional limitations due to the deficits listed below (see PT Problem List).  Pt will benefit from skilled PT to increase their independence and safety with mobility to allow discharge to the venue listed below.       Follow Up Recommendations SNF;Supervision/Assistance - 24 hour    Equipment Recommendations  None recommended by PT    Recommendations for Other Services       Precautions / Restrictions Precautions Precautions: Fall      Mobility  Bed Mobility Overal bed mobility: Needs Assistance Bed Mobility: Sidelying to Sit   Sidelying to sit: Mod assist       General bed mobility comments: much extra time to initiate and complete activity, HOB assisted to sitting, bed pad to  bed edge  Transfers Overall transfer level: Needs assistance Equipment used: Rolling walker (2 wheeled) Transfers: Sit to/from Stand Sit to Stand: Min assist;Mod assist         General transfer comment: Initially standing from bed with min guard, after ambulating  steady assist for balance to rise.  Ambulation/Gait Ambulation/Gait assistance: Min assist;Max assist   Assistive device: Rolling  walker (2 wheeled) Gait Pattern/deviations: Step-through pattern;Festinating;Step-to pattern;Decreased stride length;Decreased weight shift to right;Decreased weight shift to left     General Gait Details: for first  71' the patient  demonstrated reciprocal gait while holding up the RW. and min assist.Then  noted with significant festinating gait  and mod assist to point that patient was unable to progress forward. Attempted manual weight shifting. pastient would cover 10' intervals or less and stop due to festinating gait. Finally after covering 150' and taking 35 minutes, patient was returned to room via a rolling chair.  Stairs            Wheelchair Mobility    Modified Rankin (Stroke Patients Only)       Balance Overall balance assessment: Needs assistance;History of Falls Sitting-balance support: Feet supported;No upper extremity supported Sitting balance-Leahy Scale: Fair     Standing balance support: During functional activity;Bilateral upper extremity supported Standing balance-Leahy Scale: Poor                               Pertinent Vitals/Pain Pain Assessment: Faces Faces Pain Scale: Hurts even more Pain Location: back and right shoulder- wedged between toilet and tub after fall Pain Descriptors / Indicators: Aching;Discomfort;Guarding;Grimacing Pain Intervention(s): Monitored during session    Home Living Family/patient expects to be discharged to:: Private residence Living Arrangements: Alone Available Help at Discharge: Family;Available PRN/intermittently Type of Home: House Home Access: Stairs to enter   Entrance Stairs-Number of Steps: 4-5 Home Layout: One level Home Equipment: Walker - 4 wheels;Walker -  standard;Cane - single point      Prior Function Level of Independence: Independent with assistive device(s)         Comments: still drives     Hand Dominance        Extremity/Trunk Assessment   Upper Extremity  Assessment Upper Extremity Assessment: Defer to OT evaluation    Lower Extremity Assessment Lower Extremity Assessment: Generalized weakness    Cervical / Trunk Assessment Cervical / Trunk Assessment: Kyphotic  Communication   Communication: No difficulties  Cognition Arousal/Alertness: Awake/alert Behavior During Therapy: WFL for tasks assessed/performed;Flat affect Overall Cognitive Status: Within Functional Limits for tasks assessed                                        General Comments      Exercises     Assessment/Plan    PT Assessment Patient needs continued PT services  PT Problem List Decreased strength;Decreased range of motion;Decreased knowledge of use of DME;Decreased activity tolerance;Decreased safety awareness;Decreased balance;Decreased knowledge of precautions;Decreased mobility;Decreased coordination       PT Treatment Interventions DME instruction;Therapeutic exercise;Gait training;Balance training;Neuromuscular re-education;Functional mobility training;Therapeutic activities;Patient/family education    PT Goals (Current goals can be found in the Care Plan section)  Acute Rehab PT Goals Patient Stated Goal: to move to house 2 doors from dtr PT Goal Formulation: With patient Time For Goal Achievement: 12/21/17 Potential to Achieve Goals: Good    Frequency Min 3X/week   Barriers to discharge Decreased caregiver support;Inaccessible home environment      Co-evaluation               AM-PAC PT "6 Clicks" Daily Activity  Outcome Measure Difficulty turning over in bed (including adjusting bedclothes, sheets and blankets)?: Unable Difficulty moving from lying on back to sitting on the side of the bed? : Unable Difficulty sitting down on and standing up from a chair with arms (e.g., wheelchair, bedside commode, etc,.)?: Unable Help needed moving to and from a bed to chair (including a wheelchair)?: Total Help needed walking in  hospital room?: Total Help needed climbing 3-5 steps with a railing? : Total 6 Click Score: 6    End of Session Equipment Utilized During Treatment: Gait belt Activity Tolerance: Patient tolerated treatment well Patient left: in chair;with call bell/phone within reach;with chair alarm set Nurse Communication: Mobility status PT Visit Diagnosis: Unsteadiness on feet (R26.81);History of falling (Z91.81)    Time: 1610-9604 PT Time Calculation (min) (ACUTE ONLY): 42 min   Charges:   PT Evaluation $PT Eval Moderate Complexity: 1 Mod PT Treatments $Gait Training: 23-37 mins         [place] PT [phone]   Claretha Cooper 12/07/2017, 5:18 PM

## 2017-12-07 NOTE — Progress Notes (Signed)
  Echocardiogram 2D Echocardiogram has been performed.  Jeffrey Porter 12/07/2017, 3:43 PM

## 2017-12-07 NOTE — Care Management Note (Signed)
Case Management Note  Patient Details  Name: Jeffrey Porter MRN: [id number] Date of Birth: 10-24-1931  Subjective/Objective:                  84 y.o. male, wh/o prostate cancer apparently felt at home and was down for about 12 hours.  Pt notes that he has had increase in frequency of urination and fever since yesterday and felt weak in general.  Pt was brought to [facility] urgent care and sent to ER for evaluation of ? Rhabdo, and UTI.   In ED,  Lactic acid 3.48=> 4.09 CPK 1,430 Wbc 22.3, Hgb 14.8, Plt 287 Na 140, K 4.0,  Bun 28, Creatinine 0.83 Ast 423, Alt 667, Alk phos 99, T. Bili 5.5 [id number]/ck,md=9.45 Action/Plan: Will follow for cm needs. Lives alone/ may need hhc due to fall and no support group/will refer to thn.  Expected Discharge Date:                  Expected Discharge Plan:  Home/Self Care  In-House Referral:     Discharge planning Services  CM Consult  Post Acute Care Choice:    Choice offered to:     DME Arranged:    DME Agency:     HH Arranged:    HH Agency:     Status of Service:  In process, will continue to follow  If discussed at Long Length of Stay Meetings, dates discussed:    Additional Comments:  Leeroy Cha, RN 12/07/2017, 11:19 AM

## 2017-12-08 ENCOUNTER — Inpatient Hospital Stay (HOSPITAL_COMMUNITY): Payer: Medicare HMO

## 2017-12-08 DIAGNOSIS — J9601 Acute respiratory failure with hypoxia: Secondary | ICD-10-CM

## 2017-12-08 DIAGNOSIS — I471 Supraventricular tachycardia: Secondary | ICD-10-CM

## 2017-12-08 LAB — CBC
HCT: 43 % (ref 39.0–52.0)
Hemoglobin: 15.4 g/dL (ref 13.0–17.0)
MCH: 30.6 pg (ref 26.0–34.0)
MCHC: 35.8 g/dL (ref 30.0–36.0)
MCV: 85.3 fL (ref 78.0–100.0)
Platelets: 253 10*3/uL (ref 150–400)
RBC: 5.04 MIL/uL (ref 4.22–5.81)
RDW: 13.3 % (ref 11.5–15.5)
WBC: 15.9 10*3/uL — ABNORMAL HIGH (ref 4.0–10.5)

## 2017-12-08 LAB — BLOOD GAS, ARTERIAL
Acid-base deficit: 5.4 mmol/L — ABNORMAL HIGH (ref 0.0–2.0)
Bicarbonate: 19.2 mmol/L — ABNORMAL LOW (ref 20.0–28.0)
Delivery systems: POSITIVE
Drawn by: 225631
Expiratory PAP: 5
FIO2: 80
Inspiratory PAP: 10
O2 Saturation: 94.2 %
Patient temperature: 98.6
pCO2 arterial: 36.4 mmHg (ref 32.0–48.0)
pH, Arterial: 7.342 — ABNORMAL LOW (ref 7.350–7.450)
pO2, Arterial: 71.8 mmHg — ABNORMAL LOW (ref 83.0–108.0)

## 2017-12-08 LAB — HEPATITIS PANEL, ACUTE
HCV Ab: <0.1 s/co ratio (ref 0.0–0.9)
HEP A IGM: NEGATIVE
Hep B C IgM: NEGATIVE
Hepatitis B Surface Ag: NEGATIVE

## 2017-12-08 LAB — BASIC METABOLIC PANEL
Anion gap: 15 (ref 5–15)
BUN: 16 mg/dL (ref 8–23)
CO2: 21 mmol/L — ABNORMAL LOW (ref 22–32)
Calcium: 8.4 mg/dL — ABNORMAL LOW (ref 8.9–10.3)
Chloride: 100 mmol/L (ref 98–111)
Creatinine, Ser: 0.76 mg/dL (ref 0.61–1.24)
GFR calc Af Amer: >60 mL/min (ref >60)
GFR calc non Af Amer: >60 mL/min (ref >60)
Glucose, Bld: 177 mg/dL — ABNORMAL HIGH (ref 70–99)
Potassium: 3.3 mmol/L — ABNORMAL LOW (ref 3.5–5.1)
Sodium: 136 mmol/L (ref 135–145)

## 2017-12-08 LAB — HEPATIC FUNCTION PANEL
ALBUMIN: 3.7 g/dL (ref 3.5–5.0)
ALT: 319 U/L — ABNORMAL HIGH (ref 0–44)
AST: 140 U/L — AB (ref 15–41)
Alkaline Phosphatase: 120 U/L (ref 38–126)
Bilirubin, Direct: 3.5 mg/dL — ABNORMAL HIGH (ref 0.0–0.2)
Indirect Bilirubin: 2.1 mg/dL — ABNORMAL HIGH (ref 0.3–0.9)
Total Bilirubin: 5.6 mg/dL — ABNORMAL HIGH (ref 0.3–1.2)
Total Protein: 6.7 g/dL (ref 6.5–8.1)

## 2017-12-08 LAB — URINE CULTURE: Culture: NO GROWTH

## 2017-12-08 LAB — BRAIN NATRIURETIC PEPTIDE: B Natriuretic Peptide: 349.7 pg/mL — ABNORMAL HIGH (ref 0.0–100.0)

## 2017-12-08 LAB — CK: CK TOTAL: 732 U/L — AB (ref 49–397)

## 2017-12-08 LAB — MRSA PCR SCREENING: MRSA BY PCR: NEGATIVE

## 2017-12-08 MED ORDER — VANCOMYCIN HCL IN DEXTROSE 1-5 GM/200ML-% IV SOLN: 1000.0000 mg | INTRAVENOUS | Status: DC

## 2017-12-08 MED ORDER — IOPAMIDOL (ISOVUE-300) INJECTION 61%
30.0000 mL | Freq: Once | INTRAVENOUS | Status: AC | PRN
Start: 2017-12-08 — End: 2017-12-08
  Administered 2017-12-08: 30 mL via ORAL

## 2017-12-08 MED ORDER — CHLORHEXIDINE GLUCONATE 0.12 % MT SOLN
15.0000 mL | Freq: Two times a day (BID) | OROMUCOSAL | Status: DC
  Administered 2017-12-08 – 2017-12-15 (×13): 15 mL via OROMUCOSAL
  Filled 2017-12-08 (×13): qty 15

## 2017-12-08 MED ORDER — SODIUM CHLORIDE 0.9 % IV SOLN
INTRAVENOUS | Status: DC | PRN
Start: 2017-12-08 — End: 2017-12-15
  Administered 2017-12-08: 10 mL/h via INTRAVENOUS
  Administered 2017-12-10 – 2017-12-11 (×2): 250 mL via INTRAVENOUS

## 2017-12-08 MED ORDER — LORAZEPAM 2 MG/ML IJ SOLN: 0.5000 mg | Freq: Once | INTRAMUSCULAR | Status: DC

## 2017-12-08 MED ORDER — FUROSEMIDE 10 MG/ML IJ SOLN
INTRAMUSCULAR | Status: AC
  Filled 2017-12-08: qty 4

## 2017-12-08 MED ORDER — SODIUM CHLORIDE 0.9 % IV SOLN
500.0000 mg | INTRAVENOUS | Status: AC
  Administered 2017-12-08 – 2017-12-14 (×7): 500 mg via INTRAVENOUS
  Filled 2017-12-08 (×7): qty 500

## 2017-12-08 MED ORDER — FUROSEMIDE 10 MG/ML IJ SOLN
40.0000 mg | Freq: Once | INTRAMUSCULAR | Status: AC
Start: 2017-12-08 — End: 2017-12-08
  Administered 2017-12-08: 40 mg via INTRAVENOUS

## 2017-12-08 MED ORDER — METOPROLOL TARTRATE 5 MG/5ML IV SOLN
INTRAVENOUS | Status: AC
  Filled 2017-12-08: qty 5

## 2017-12-08 MED ORDER — IOPAMIDOL (ISOVUE-300) INJECTION 61%
INTRAVENOUS | Status: AC
  Filled 2017-12-08: qty 30

## 2017-12-08 MED ORDER — VANCOMYCIN HCL IN DEXTROSE 1-5 GM/200ML-% IV SOLN
1000.0000 mg | Freq: Once | INTRAVENOUS | Status: AC
  Administered 2017-12-08: 1000 mg via INTRAVENOUS
  Filled 2017-12-08: qty 200

## 2017-12-08 MED ORDER — IOHEXOL 300 MG/ML  SOLN
100.0000 mL | Freq: Once | INTRAMUSCULAR | Status: AC | PRN
  Administered 2017-12-08: 100 mL via INTRAVENOUS

## 2017-12-08 MED ORDER — SODIUM CHLORIDE 0.9 % IV SOLN
2.0000 g | INTRAVENOUS | Status: AC
  Administered 2017-12-08 – 2017-12-14 (×7): 2 g via INTRAVENOUS
  Filled 2017-12-08 (×7): qty 2

## 2017-12-08 MED ORDER — METOPROLOL TARTRATE 5 MG/5ML IV SOLN
5.0000 mg | Freq: Once | INTRAVENOUS | Status: AC
  Administered 2017-12-08: 5 mg via INTRAVENOUS

## 2017-12-08 MED ORDER — PIPERACILLIN-TAZOBACTAM 3.375 G IVPB
3.3750 g | Freq: Three times a day (TID) | INTRAVENOUS | Status: DC
  Administered 2017-12-08 (×2): 3.375 g via INTRAVENOUS
  Filled 2017-12-08 (×2): qty 50

## 2017-12-08 MED ORDER — POTASSIUM CHLORIDE 20 MEQ/15ML (10%) PO SOLN
40.0000 meq | Freq: Once | ORAL | Status: AC
  Administered 2017-12-08: 40 meq
  Filled 2017-12-08: qty 30

## 2017-12-08 MED ORDER — ORAL CARE MOUTH RINSE
15.0000 mL | Freq: Two times a day (BID) | OROMUCOSAL | Status: DC
  Administered 2017-12-09 – 2017-12-11 (×3): 15 mL via OROMUCOSAL

## 2017-12-08 NOTE — Progress Notes (Addendum)
PROGRESS NOTE    Jeffrey Porter  RSW:[id number] DOB: 1932-01-25 DOA: 12/06/2017 PCP: [name], MD     Brief Narrative:  Jeffrey Porter is a 84 yo male with past medical history significant for hyperlipidemia, cataract who presents after fall at home.  He states that he went to the restroom to vomit, then while getting up, he lost his balance and fell.  He denies any loss of consciousness.  He states that it took him about 12 hours to finally be able to get up.  He lives alone.  He apparently went to urgent care, was sent then to the emergency department.  In the emergency department, his CK was 1430, lactic acid 3.48, WBC 22.3.  He was apparently diagnosed with a urinary tract infection at urgent care.  New events last 24 hours / Subjective: Rapid response called last night due to acute respiratory distress.  Patient was found to be satting in the 60s on room air.  He did improve with nonrebreather although had persistent increased work of breathing.  He was transferred to stepdown unit and placed on BiPAP.  He was given IV Lasix with over 1 L diuresis with improvement in respiration.  He was also started on IV vancomycin, Zosyn.  This morning, patient was taken off BiPAP, remains on nasal cannula O2.  He states that his breathing has improved since overnight events.  Had some abdominal pain yesterday, no abdominal pain today.  Denies any chest pain today.  Assessment & Plan:   Principal Problem:   Sepsis ([place]) Active Problems:   Acute lower UTI   Tachycardia   Leukocytosis   Acute respiratory failure with hypoxia (HCC)   Paroxysmal supraventricular tachycardia (HCC)  Fall at home without loss of consciousness -PT OT evaluation, recommending SNF  Rhabdomyolysis -Continue to trend CK. Improving.   Sepsis secondary to Klebsiella bacteremia  -I am unable to locate urinalysis result (completed at urgent care).  Per ED physician documentation, patient was diagnosed with UTI at urgent care.  Urine culture negative, no growth -Continue Zosyn  -Will check CT A/P to rule out abdominal source of bacteremia  -Repeat blood culture today   Acute hypoxemic respiratory failure secondary to HCAP  -CXR revealed interstitial and alveolar airspace opacities most compatible with bronchopneumonia. Started on vanco/zosyn  -Required BiPAP overnight, now on [place] O2 -Wean as able   Elevated transaminase -Likely secondary to dehydration -LFT trending down -Right upper quadrant ultrasound negative -Acute hepatitis panel negative  Elevated troponin -Likely secondary to demand ischemia, patient currently denies any chest pain -Troponin peak at 0.66, echocardiogram with no wall motion abnormalities  Aortic stenosis -Need outpatient follow up   Elevated ddimer -Negative CTA for PE   Hypokalemia -Replace, trend    DVT prophylaxis: Lovenox  Code Status: Full code Family Communication: No family at bedside Disposition Plan: Pending clinical improvement   Consultants:   None  Procedures:   None   Antimicrobials:  Anti-infectives (From admission, onward)   Start     Dose/Rate Route Frequency Ordered Stop   12/09/17 0600  vancomycin (VANCOCIN) IVPB 1000 mg/200 mL premix     1,000 mg 200 mL/hr over 60 Minutes Intravenous Every 24 hours 12/08/17 0652     12/08/17 0700  piperacillin-tazobactam (ZOSYN) IVPB 3.375 g     3.375 g 12.5 mL/hr over 240 Minutes Intravenous Every 8 hours 12/08/17 0650     12/08/17 0700  vancomycin (VANCOCIN) IVPB 1000 mg/200 mL premix     1,000 mg  200 mL/hr over 60 Minutes Intravenous  Once 12/08/17 0651 12/08/17 0808   12/07/17 2200  cefTRIAXone (ROCEPHIN) 1 g in sodium chloride 0.9 % 100 mL IVPB  Status:  Discontinued     1 g 200 mL/hr over 30 Minutes Intravenous Every 24 hours 12/07/17 0131 12/07/17 1449   12/07/17 1500  cefTRIAXone (ROCEPHIN) 2 g in sodium chloride 0.9 % 100 mL IVPB  Status:  Discontinued     2 g 200 mL/hr over 30 Minutes Intravenous  Daily 12/07/17 1449 12/08/17 0654   12/06/17 2145  cefTRIAXone (ROCEPHIN) 1 g in sodium chloride 0.9 % 100 mL IVPB     1 g 200 mL/hr over 30 Minutes Intravenous  Once 12/06/17 2134 12/06/17 2234       Objective: Vitals:   12/08/17 0600 12/08/17 0615 12/08/17 0630 12/08/17 0800  BP: (!) 125/53   (!) 95/40  Pulse: (!) 108 99 78 90  Resp: (!) 21 (!) 24 (!) 23 (!) 21  Temp: 99.3 F (37.4 C)   98.4 F (36.9 C)  TempSrc: Oral   Oral  SpO2: 97% 97% 97% 93%  Weight:      Height:        Intake/Output Summary (Last 24 hours) at 12/08/2017 1138 Last data filed at 12/08/2017 0600 Gross per 24 hour  Intake 1449.56 ml  Output 800 ml  Net 649.56 ml   Filed Weights   12/06/17 2117 12/08/17 0500  Weight: 61.7 kg 65.8 kg    Examination: General exam: Appears calm and comfortable  Respiratory system: Diminished breath sounds. Respiratory effort normal. On [place] O2. No conversational dyspnea  Cardiovascular system: S1 & S2 heard, RRR. +systolic murmur  Gastrointestinal system: Abdomen is nondistended, soft and nontender. No organomegaly or masses felt. Normal bowel sounds heard. Central nervous system: Alert and oriented. No focal neurological deficits. Extremities: Symmetric 5 x 5 power. Skin: No rashes, lesions or ulcers Psychiatry: Judgement and insight appear normal. Mood & affect appropriate.    Data Reviewed: I have personally reviewed following labs and imaging studies  CBC: Recent Labs  Lab 12/06/17 2207 12/07/17 0557 12/08/17 0548  WBC 22.3* 14.1* 15.9*  NEUTROABS 21.1*  --   --   HGB 14.8 13.0 15.4  HCT 40.8 36.6* 43.0  MCV 85.5 85.7 85.3  PLT 287 241 222   Basic Metabolic Panel: Recent Labs  Lab 12/06/17 2207 12/07/17 0557 12/08/17 0548  NA 140 140 136  K 4.0 3.7 3.3*  CL 103 105 100  CO2 25 26 21*  GLUCOSE 130* 103* 177*  BUN 28* 20 16  CREATININE 0.83 0.65 0.76  CALCIUM 9.6 8.9 8.4*   GFR: Estimated Creatinine Clearance: 58.5 mL/min (by C-G formula  based on SCr of 0.76 mg/dL). Liver Function Tests: Recent Labs  Lab 12/06/17 2207 12/07/17 0557 12/08/17 0548  AST 423* 253* 140*  ALT 667* 505* 319*  ALKPHOS 99 84 120  BILITOT 5.5* 5.8* 5.6*  PROT 6.9 6.1* 6.7  ALBUMIN 4.4 3.7 3.7   No results for input(s): LIPASE, AMYLASE in the last 168 hours. No results for input(s): AMMONIA in the last 168 hours. Coagulation Profile: Recent Labs  Lab 12/07/17 0557  INR 1.80   Cardiac Enzymes: Recent Labs  Lab 12/06/17 2207 12/06/17 2333 12/07/17 0557 12/07/17 1241 12/08/17 0548  CKTOTAL 1,430*  --  1,505*  --  732*  CKMB  --   --  9.5*  --   --   TROPONINI  --  0.58* 0.66*  0.53*  --    BNP (last 3 results) No results for input(s): PROBNP in the last 8760 hours. HbA1C: No results for input(s): HGBA1C in the last 72 hours. CBG: No results for input(s): GLUCAP in the last 168 hours. Lipid Profile: No results for input(s): CHOL, HDL, LDLCALC, TRIG, CHOLHDL, LDLDIRECT in the last 72 hours. Thyroid Function Tests: Recent Labs    12/06/17 2333  TSH 0.861   Anemia Panel: No results for input(s): VITAMINB12, FOLATE, FERRITIN, TIBC, IRON, RETICCTPCT in the last 72 hours. Sepsis Labs: Recent Labs  Lab 12/06/17 2216 12/07/17 0015  LATICACIDVEN 3.48* 4.09*    Recent Results (from the past 240 hour(s))  Blood culture (routine x 2)     Status: None (Preliminary result)   Collection Time: 12/06/17 10:06 PM  Result Value Ref Range Status   Specimen Description   Final    BLOOD LEFT FOREARM Performed at [facility] [address] [place], [place] [address]    Special Requests   Final    BOTTLES DRAWN AEROBIC AND ANAEROBIC Blood Culture adequate volume Performed at [facility] [address] [place], [place] [address]    Culture  Setup Time   Final    GRAM NEGATIVE RODS AEROBIC BOTTLE ONLY Performed at [facility], [place] [address] [place], [place] [address]    Culture  GRAM NEGATIVE RODS  Final   Report Status PENDING  Incomplete  Blood culture (routine x 2)     Status: Abnormal (Preliminary result)   Collection Time: 12/06/17 10:06 PM  Result Value Ref Range Status   Specimen Description   Final    BLOOD LEFT WRIST Performed at [facility] [address], [place], [place] [address]    Special Requests   Final    BOTTLES DRAWN AEROBIC AND ANAEROBIC Blood Culture results may not be optimal due to an inadequate volume of blood received in culture bottles Performed at [facility] [address], [place], [place] [address]    Culture  Setup Time   Final    GRAM NEGATIVE RODS IN BOTH AEROBIC AND ANAEROBIC BOTTLES CRITICAL RESULT CALLED TO, READ BACK BY AND VERIFIED WITH: Missy Sabins [facility] [phone] FCP    Culture (A)  Final    KLEBSIELLA PNEUMONIAE SUSCEPTIBILITIES TO FOLLOW Performed at [facility], [place] [address] [place],  [address]    Report Status PENDING  Incomplete  Blood Culture ID Panel (Reflexed)     Status: Abnormal   Collection Time: 12/06/17 10:06 PM  Result Value Ref Range Status   Enterococcus species NOT DETECTED NOT DETECTED Final   Listeria monocytogenes NOT DETECTED NOT DETECTED Final   Staphylococcus species NOT DETECTED NOT DETECTED Final   Staphylococcus aureus NOT DETECTED NOT DETECTED Final   Streptococcus species NOT DETECTED NOT DETECTED Final   Streptococcus agalactiae NOT DETECTED NOT DETECTED Final   Streptococcus pneumoniae NOT DETECTED NOT DETECTED Final   Streptococcus pyogenes NOT DETECTED NOT DETECTED Final   Acinetobacter baumannii NOT DETECTED NOT DETECTED Final   Enterobacteriaceae species DETECTED (A) NOT DETECTED Final    Comment: Enterobacteriaceae represent a large family of gram-negative bacteria, not a single organism. CRITICAL RESULT CALLED TO, READ BACK BY AND VERIFIED WITH: PHARMD D WOFFORD [phone] FCP    Enterobacter cloacae complex NOT DETECTED NOT  DETECTED Final   Escherichia coli NOT DETECTED NOT DETECTED Final   Klebsiella oxytoca NOT DETECTED NOT DETECTED  Final   Klebsiella pneumoniae DETECTED (A) NOT DETECTED Final    Comment: CRITICAL RESULT CALLED TO, READ BACK BY AND VERIFIED WITH: PHARMD D WOFFORD [phone] FCP    Proteus species NOT DETECTED NOT DETECTED Final   Serratia marcescens NOT DETECTED NOT DETECTED Final   Carbapenem resistance NOT DETECTED NOT DETECTED Final   Haemophilus influenzae NOT DETECTED NOT DETECTED Final   Neisseria meningitidis NOT DETECTED NOT DETECTED Final   Pseudomonas aeruginosa NOT DETECTED NOT DETECTED Final   Candida albicans NOT DETECTED NOT DETECTED Final   Candida glabrata NOT DETECTED NOT DETECTED Final   Candida krusei NOT DETECTED NOT DETECTED Final   Candida parapsilosis NOT DETECTED NOT DETECTED Final   Candida tropicalis NOT DETECTED NOT DETECTED Final  Urine culture     Status: None   Collection Time: 12/06/17 11:33 PM  Result Value Ref Range Status   Specimen Description   Final    URINE, RANDOM Performed at [facility], [place] [address] [place], [place] [address]    Special Requests   Final    NONE Performed at [facility], [place] [address], [place], [place] [address]    Culture   Final    NO GROWTH Performed at [facility], [place] [address], [place], [place] [address]    Report Status 12/08/2017 FINAL  Final  MRSA PCR Screening     Status: None   Collection Time: 12/08/17  5:27 AM  Result Value Ref Range Status   MRSA by PCR NEGATIVE NEGATIVE Final    Comment:        The GeneXpert MRSA Assay (FDA approved for NASAL specimens only), is one component of a comprehensive MRSA colonization surveillance program. It is not intended to diagnose MRSA infection nor to guide or monitor treatment for MRSA infections. Performed at [facility], [place] [address], [place], [place] [address]        Radiology  Studies: Dg Chest 2 View  Result Date: 12/06/2017 CLINICAL DATA:  [place] this morning. Weakness and fever. Leukocytosis. History of prostate cancer. EXAM: CHEST - 2 VIEW COMPARISON:  None. FINDINGS: Cardiomediastinal silhouette is normal. Calcified aortic arch. No pleural effusions or focal consolidations. Trachea projects midline and there is no pneumothorax. Soft tissue planes and included osseous structures are non-suspicious. Mild degenerative change of the thoracic spine. Asymmetrically prominent RIGHT chest wall soft tissue may be positional, recommend correlation with physical examination. IMPRESSION: 1. No acute cardiopulmonary process. 2.  Aortic Atherosclerosis (ICD10-I70.0). Electronically Signed   By: Elon Alas M.D.   On: 12/06/2017 22:12   Ct Angio Chest Pe W Or Wo Contrast  Result Date: 12/07/2017 CLINICAL DATA:  Fall, down for 12 hours. Fever and back pain. Leukocytosis. History of prostate cancer. EXAM: CT ANGIOGRAPHY CHEST WITH CONTRAST TECHNIQUE: Multidetector CT imaging of the chest was performed using the standard protocol during bolus administration of intravenous contrast. Multiplanar CT image reconstructions and MIPs were obtained to evaluate the vascular anatomy. CONTRAST:  121mL ISOVUE-370 IOPAMIDOL (ISOVUE-370) INJECTION 76% COMPARISON:  Chest radiograph December 06, 2017 FINDINGS: CARDIOVASCULAR: Adequate contrast opacification of the pulmonary artery's. Main pulmonary artery is not enlarged. No pulmonary arterial filling defects to the level of the subsegmental branches. Heart size is normal. Mild coronary artery calcification. No pericardial effusion. Thoracic aorta is normal course and caliber, moderate calcific atherosclerosis and intimal thickening. MEDIASTINUM/NODES: No lymphadenopathy by CT size criteria. LUNGS/PLEURA: Tracheobronchial tree is patent, no pneumothorax. No pleural effusions, focal  consolidations, pulmonary nodules or masses. Dependent atelectasis. UPPER  ABDOMEN: Nonacute. Subcentimeter hypodensity in segment 4, potential cyst. MUSCULOSKELETAL: Nonacute. Mild degenerative change of the thoracic spine. Review of the MIP images confirms the above findings. IMPRESSION: 1. No acute pulmonary embolism nor acute cardiopulmonary process. Aortic Atherosclerosis (ICD10-I70.0). Electronically Signed   By: Elon Alas M.D.   On: 12/07/2017 04:53   Dg Chest Port 1 View  Result Date: 12/08/2017 CLINICAL DATA:  Respiratory distress. EXAM: PORTABLE CHEST 1 VIEW COMPARISON:  Chest radiograph December 06, 2017 and chest CT December 07, 2017 FINDINGS: New interstitial and LEFT greater than RIGHT alveolar airspace opacities. No pleural effusion. Cardiac silhouette is normal. Calcified aortic arch. No pneumothorax. Soft tissue planes and included osseous structures are non suspicious. IMPRESSION: New interstitial and alveolar airspace opacities most compatible with bronchopneumonia. Aortic Atherosclerosis (ICD10-I70.0). Electronically Signed   By: Elon Alas M.D.   On: 12/08/2017 05:25   US Abdomen Limited Ruq  Result Date: 12/07/2017 CLINICAL DATA:  Abnormal liver enzymes EXAM: ULTRASOUND ABDOMEN LIMITED RIGHT UPPER QUADRANT COMPARISON:  None. FINDINGS: Gallbladder: No gallstones or wall thickening visualized. There is no pericholecystic fluid. No sonographic Murphy sign noted by sonographer. Common bile duct: Diameter: 6 mm. No intrahepatic or extrahepatic biliary duct dilatation. Liver: No focal lesion identified. Within normal limits in parenchymal echogenicity. Portal vein is patent on color Doppler imaging with normal direction of blood flow towards the liver. IMPRESSION: Study within normal limits. Electronically Signed   By: Lowella Grip III M.D.   On: 12/07/2017 10:10      Scheduled Meds: . cholecalciferol  2,000 Units Oral Daily  . enoxaparin (LOVENOX) injection  40 mg Subcutaneous QHS  . latanoprost  1 drop Both Eyes QHS  . LORazepam  0.5 mg  Intravenous Once  . magnesium oxide  200 mg Oral Daily  . multivitamin with minerals  1 tablet Oral Daily  . ondansetron (ZOFRAN) IV  4 mg Intravenous Once   Continuous Infusions: . piperacillin-tazobactam (ZOSYN)  IV Stopped (12/08/17 1109)  . [START ON 12/09/2017] vancomycin       LOS: 2 days    Time spent: 25 minutes   Dessa Phi, DO [facility] [email or web address] Password [facility] 12/08/2017, 11:38 AM

## 2017-12-08 NOTE — Progress Notes (Signed)
Rt took pt off Bipap and placed on 3LPM [place]. No distress noted at this time.

## 2017-12-08 NOTE — Progress Notes (Signed)
OT Cancellation Note  Patient Details Name: Jeffrey Porter MRN: [id number] DOB: 05-14-1931   Cancelled Treatment:    Reason Eval/Treat Not Completed: Medical issues which prohibited therapy. Pt transferred to ICU/step down due to respiratory distress and being tachy. On Bipap. Will check back tomorrow.  Kamil Mchaffie 12/08/2017, 7:27 AM  Lesle Chris, OTR/L [phone] 12/08/2017

## 2017-12-08 NOTE — Progress Notes (Signed)
Pt.was tachy and in respiratory distress ,rapid response team called and transferred to ICU.

## 2017-12-08 NOTE — Care Management Note (Signed)
Case Management Note  Patient Details  Name: Jeffrey Porter MRN: [id number] Date of Birth: 1932/01/16  Subjective/Objective:                  Bld. Cultures + for klebsiella /u.culture-0neg/ on iv zosyn and iv vancomycin/wbc 15.9  Action/Plan: following for progression and cm needs  Expected Discharge Date:                  Expected Discharge Plan:  Home/Self Care  In-House Referral:     Discharge planning Services  CM Consult  Post Acute Care Choice:    Choice offered to:     DME Arranged:    DME Agency:     HH Arranged:    [place] Agency:     Status of Service:  In process, will continue to follow  If discussed at Long Length of Stay Meetings, dates discussed:    Additional Comments:  Leeroy Cha, RN 12/08/2017, 10:22 AM

## 2017-12-08 NOTE — Progress Notes (Signed)
Pharmacy Antibiotic Note  Jeffrey Porter is a 84 y.o. male admitted on 12/06/2017 with pneumonia.  Pharmacy has been consulted for Vancomycin and Zosyn dosing.  Plan: Vancomycin 1gm iv x1, then 1gm iv q24hr Zosyn 3.375g IV Q8H infused over 4hrs.  Goal AUC = 400 - 500 for all indications, except meningitis (goal AUC > 500 and Cmin 15-20 mcg/mL)   Height: 5\' 2"  (157.5 cm) Weight: 145 lb 1 oz (65.8 kg) IBW/kg (Calculated) : 54.6  Temp (24hrs), Avg:99 F (37.2 C), Min:98.1 F (36.7 C), Max:99.7 F (37.6 C)  Recent Labs  Lab 12/06/17 2207 12/06/17 2216 12/07/17 0015 12/07/17 0557 12/08/17 0548  WBC 22.3*  --   --  14.1* 15.9*  CREATININE 0.83  --   --  0.65 0.76  LATICACIDVEN  --  3.48* 4.09*  --   --     Estimated Creatinine Clearance: 58.5 mL/min (by C-G formula based on SCr of 0.76 mg/dL).    Allergies  Allergen Reactions  . Timolol Maleate Other (See Comments)    Syncope     Antimicrobials this admission: Vancomycin 12/08/2017 >> Zosyn 12/08/2017 >>  Ceftriaxone 8/14 x1  Dose adjustments this admission: -  Microbiology results: -  Thank you for allowing pharmacy to be a part of this patient's care.  Nani Skillern Crowford 12/08/2017 6:52 AM

## 2017-12-08 NOTE — Consult Note (Signed)
   [facility] CM Inpatient Consult   12/08/2017  Kye Hedden [birth year] [id number]    Referral received from inpatient [facility] for [facility] [place] [facility] services with recent fall at home and declines SNF placement at this time and likely risk for hospital readmission.   Spoke with Mr. Weathington at bedside to explain and offer [facility] services. Mr. Santelli is agreeable and [facility] Care Management written consent obtain. [facility] folder provided.   Mr. Langlois endorses that he lives alone. States he lives in [place], [place] currently but plans to move closer to his daughter in [place] soon. He is agreeable to Telephonic RNCM calls for care coordination and follow up.   Confirmed Primary MD is Dr. Sabra Heck [facility] listed as doing transition of care calls post discharge).  Confirmed best contact number for Mr. Cornforth is [phone].  Left voicemail message from inpatient RNCM to make aware [facility] will follow post discharge.   Will make referral to Telephonic [facility] for care coordination. Has a medical history of sepsis, UTI, acute respiratory failure, PSVT, HLD, prostate cancer.    Marthenia Rolling, MSN-Ed, RN,BSN [facility] Liaison [phone]

## 2017-12-08 NOTE — Progress Notes (Addendum)
Shift event note: Notified at approx 0445 by RN regarding pt found by RN in acute resp distress w/ 02 sats in 60's on R/A (from 96% or > on r/a all shift). RR RN was paged and responded to bedside. Pt placed on NRB and sats improved w/ persistent increased WOB. Record reviewed briefly. Upon arrival to bedside by this NP pt noted in acute resp distress. Skin is pale, cool, moist and mottled on all extremities. Decreased responsiveness though responds to voice and will open eyes and track. BBS w/ coarse rhonchi and diffuse crackles. Last temp 99.3, HR-160-170's, RR-38-42 w/ 02 sats in low to mid 90's on NRB and BP 160's/100's. ABG > 7.34/36.4/71.8/19.2 and CXR findings c/w bronchopneumonia. RN notified family of change in pt's status. Assessment/Plan: 1. Acute hypoxic respiratory failure: In setting of pt admitted for fall (down 12 hours >rhabdo) weakness, leukocytosis and fever. Pt transferred to SDU and placed on BiPAP. Foley placed and pt quickly diuresed > 1L after IV Lasix. IVF's decreased to [facility] for now. HR w/ minimal improvement after respiratory status stabilized so Lopressor 5 mg IV given which improved HR to low 100's. BP improved to 160's/70's. Color much improved. Mottling resolved. Pt now very alert and oriented and following commands. Given initial presentation for admission IV Vanc and Zosyn added. Will likely wean off BiPAP this am. 2. Tachycardia: Likely respiratory driven. Much improved w/ IV Lopressor. Will continue to monitor closely in SDU.   Jeryl Columbia, NP-C [facility] Pager [phone]    CRITICAL CARE Performed by: Jeryl Columbia   Total critical care time: 90 minutes  Critical care time was exclusive of separately billable procedures and treating other patients.  Critical care was necessary to treat or prevent imminent or life-threatening deterioration.  Critical care was time spent personally by me on the following activities: development of treatment  plan with patient and/or surrogate as well as nursing, discussions with consultants, evaluation of patient's response to treatment, examination of patient, obtaining history from patient or surrogate, ordering and performing treatments and interventions, ordering and review of laboratory studies, ordering and review of radiographic studies, pulse oximetry and re-evaluation of patient's condition.

## 2017-12-08 NOTE — Progress Notes (Signed)
Pt seen, no increased wob or respiratory distress noted or voiced by pt at this time.  Pt remains on 3lnc, HR87,rr22, spo2 97%.  Bipap not indicated at this time.  Rx changed to prn per RT protocol.

## 2017-12-09 LAB — CULTURE, BLOOD (ROUTINE X 2): SPECIAL REQUESTS: ADEQUATE

## 2017-12-09 LAB — HEPATIC FUNCTION PANEL
ALK PHOS: 94 U/L (ref 38–126)
ALT: 195 U/L — AB (ref 0–44)
AST: 75 U/L — AB (ref 15–41)
Albumin: 2.9 g/dL — ABNORMAL LOW (ref 3.5–5.0)
BILIRUBIN DIRECT: 4 mg/dL — AB (ref 0.0–0.2)
Indirect Bilirubin: 2.3 mg/dL — ABNORMAL HIGH (ref 0.3–0.9)
Total Bilirubin: 6.3 mg/dL — ABNORMAL HIGH (ref 0.3–1.2)
Total Protein: 5.8 g/dL — ABNORMAL LOW (ref 6.5–8.1)

## 2017-12-09 LAB — CBC
HCT: 37.9 % — ABNORMAL LOW (ref 39.0–52.0)
Hemoglobin: 13.3 g/dL (ref 13.0–17.0)
MCH: 30 pg (ref 26.0–34.0)
MCHC: 35.1 g/dL (ref 30.0–36.0)
MCV: 85.6 fL (ref 78.0–100.0)
Platelets: 211 10*3/uL (ref 150–400)
RBC: 4.43 MIL/uL (ref 4.22–5.81)
RDW: 13.4 % (ref 11.5–15.5)
WBC: 11.2 10*3/uL — ABNORMAL HIGH (ref 4.0–10.5)

## 2017-12-09 LAB — BASIC METABOLIC PANEL
Anion gap: 8 (ref 5–15)
BUN: 12 mg/dL (ref 8–23)
CO2: 27 mmol/L (ref 22–32)
Calcium: 8.1 mg/dL — ABNORMAL LOW (ref 8.9–10.3)
Chloride: 101 mmol/L (ref 98–111)
Creatinine, Ser: 0.63 mg/dL (ref 0.61–1.24)
GFR calc Af Amer: >60 mL/min (ref >60)
GFR calc non Af Amer: >60 mL/min (ref >60)
Glucose, Bld: 124 mg/dL — ABNORMAL HIGH (ref 70–99)
Potassium: 3.3 mmol/L — ABNORMAL LOW (ref 3.5–5.1)
Sodium: 136 mmol/L (ref 135–145)

## 2017-12-09 MED ORDER — POTASSIUM CHLORIDE 20 MEQ/15ML (10%) PO SOLN
40.0000 meq | Freq: Once | ORAL | Status: AC
  Administered 2017-12-09: 40 meq
  Filled 2017-12-09: qty 30

## 2017-12-09 NOTE — Progress Notes (Signed)
Physical Therapy Treatment Patient Details Name: Jeffrey Porter MRN: [id number] DOB: [birth year] Today's Date: 12/09/2017    History of Present Illness Jeffrey Porter is a 84 yo male with past medical history significant for hyperlipidemia, cataract who presents after fall at home, on floor overnight. Admitted with UTI,  rapid response called 8/15 AM for SOB and  incr. HR    PT Comments     patient on BSC. asissted to  Stand and ambulate x 10', gait continues to be festinating with poor weight shifting/. HR 115, sats >91 % on RA. RN aware. Continue PT.   Follow Up Recommendations  SNF;Supervision/Assistance - 24 hour     Equipment Recommendations  None recommended by PT    Recommendations for Other Services       Precautions / Restrictions Precautions Precautions: Fall Precaution Comments: monitor HR sats Restrictions Weight Bearing Restrictions: No    Mobility  Bed Mobility               General bed mobility comments: oob  Transfers   Equipment used: Rolling walker (2 wheeled) Transfers: Sit to/from Stand Sit to Stand: Min assist         General transfer comment: stands  with support of RW  Ambulation/Gait Ambulation/Gait assistance: Mod assist Gait Distance (Feet): 10 Feet Assistive device: Rolling walker (2 wheeled) Gait Pattern/deviations: Step-through pattern;Festinating;Step-to pattern;Decreased stride length;Decreased weight shift to right;Decreased weight shift to left     General Gait Details: patient demonstates festinating gait. decreased abilty to take full step length   Stairs             Wheelchair Mobility    Modified Rankin (Stroke Patients Only)       Balance     Sitting balance-Leahy Scale: Fair     Standing balance support: During functional activity;Bilateral upper extremity supported Standing balance-Leahy Scale: Poor                              Cognition Arousal/Alertness: Awake/alert Behavior During  Therapy: WFL for tasks assessed/performed;Flat affect Overall Cognitive Status: Within Functional Limits for tasks assessed                                        Exercises      General Comments General comments (skin integrity, edema, etc.): pt states his back usually doesn't hurt.  VSS, had one episode skipped beat      Pertinent Vitals/Pain Pain Assessment: Faces Faces Pain Scale: Hurts little more Pain Location: back and L side Pain Descriptors / Indicators: Sore Pain Intervention(s): Monitored during session    Home Living Family/patient expects to be discharged to:: Private residence Living Arrangements: Alone Available Help at Discharge: Family;Available 24 hours/day         Home Equipment: Walker - 4 wheels;Walker - standard;Cane - single point Additional Comments: pt is in the process of moving from his home in [facility] to [place], near daughter. He plans to return to her house post d/c.  She and her husband work different shifts, so someone will be nearby    Prior Function Level of Independence: Independent with assistive device(s)      Comments: still drives; doesn't cook much; does own laundry   PT Goals (current goals can now be found in the care plan section) Acute Rehab PT Goals Patient Stated Goal:  to move to house 2 doors from dtr Progress towards PT goals: Progressing toward goals    Frequency    Min 2X/week      PT Plan Frequency needs to be updated    Co-evaluation              AM-PAC PT "6 Clicks" Daily Activity  Outcome Measure  Difficulty turning over in bed (including adjusting bedclothes, sheets and blankets)?: Unable Difficulty moving from lying on back to sitting on the side of the bed? : Unable Difficulty sitting down on and standing up from a chair with arms (e.g., wheelchair, bedside commode, etc,.)?: Unable Help needed moving to and from a bed to chair (including a wheelchair)?: Total Help needed walking in  hospital room?: Total Help needed climbing 3-5 steps with a railing? : Total 6 Click Score: 6    End of Session Equipment Utilized During Treatment: Gait belt Activity Tolerance: Patient tolerated treatment well Patient left: in chair;with call bell/phone within reach;with chair alarm set Nurse Communication: Mobility status PT Visit Diagnosis: Unsteadiness on feet (R26.81);History of falling (Z91.81)     Time: 2574-9355 PT Time Calculation (min) (ACUTE ONLY): 39 min  Charges:  $Gait Training: 8-22 mins $Self Care/Home Management: [place]    Claretha Cooper 12/09/2017, 4:59 PM

## 2017-12-09 NOTE — Care Management Note (Signed)
Case Management Note  Patient Details  Name: Stan Cantave MRN: [id number] Date of Birth: 1931/06/21  Subjective/Objective:                  Rhabdomyolysis improving/ pt eval pending for poss home care versus snf placement/febrile overnight -101.3/  Action/Plan: snf versus home care will follow  Expected Discharge Date:                  Expected Discharge Plan:  Home/Self Care  In-House Referral:     Discharge planning Services  CM Consult  Post Acute Care Choice:    Choice offered to:     DME Arranged:    DME Agency:     HH Arranged:    HH Agency:     Status of Service:  In process, will continue to follow  If discussed at Long Length of Stay Meetings, dates discussed:    Additional Comments:  Leeroy Cha, RN 12/09/2017, 9:47 AM

## 2017-12-09 NOTE — Progress Notes (Signed)
PROGRESS NOTE    Jeffrey Porter  GLO:[id number] DOB: 1931/10/15 DOA: 12/06/2017 PCP: [name], MD     Brief Narrative:  Jeffrey Porter is a 84 yo male with past medical history significant for hyperlipidemia, cataract who presents after fall at home.  He states that he went to the restroom to vomit, then while getting up, he lost his balance and fell.  He denies any loss of consciousness.  He states that it took him about 12 hours to finally be able to get up.  He lives alone.  He apparently went to urgent care, was sent then to the emergency department.  In the emergency department, his CK was 1430, lactic acid 3.48, WBC 22.3.  He was apparently diagnosed with a urinary tract infection at urgent care. Rapid response called overnight 8/15 due to acute respiratory distress.  Patient was found to be satting in the 60s on room air.  He did improve with nonrebreather although had persistent increased work of breathing.  He was transferred to stepdown unit and placed on BiPAP.  He was given IV Lasix with over 1 L diuresis with improvement in respiration.  He was also started on IV vancomycin, Zosyn for concern of HCAP.   New events last 24 hours / Subjective: He has been off BiPAP > 24 hours. Breathing well on [place] O2 this morning. He complains of some soreness of right back, but no chest pain and no abdominal pain. Had fever 101.3 overnight.   Assessment & Plan:   Principal Problem:   Sepsis ([place]) Active Problems:   Acute lower UTI   Tachycardia   Leukocytosis   Acute respiratory failure with hypoxia (HCC)   Paroxysmal supraventricular tachycardia (HCC)  Fall at home without loss of consciousness -PT OT evaluation, recommending SNF  Rhabdomyolysis -Improving   Sepsis secondary to Klebsiella bacteremia  -I am unable to locate urinalysis result (completed at urgent care).  Per ED physician documentation, patient was diagnosed with UTI at urgent care. Urine culture negative, no growth -No  intra-abdominal source of infection found on CT A/P  -Possibly transient bacteremia from urinary source?  -Continue Rocephin  -Repeat blood culture 8/15 pending clearance of bacteremia  -WBC improving, fever overnight 101.3   Acute hypoxemic respiratory failure secondary to CAP  -CXR revealed interstitial and alveolar airspace opacities most compatible with bronchopneumonia. Started on vanco/zosyn --> Now on rocephin/azithromycin  -Required BiPAP overnight 8/15, now on [place] O2 -Wean as able   Elevated transaminase -Likely secondary to dehydration -Right upper quadrant ultrasound negative -CT A/P showed diffuse hepatosteatosis  -Acute hepatitis panel negative -LFT trending down  Elevated troponin -Likely secondary to demand ischemia, patient currently denies any chest pain -Troponin peak at 0.66, echocardiogram with no wall motion abnormalities  Aortic stenosis -Need outpatient follow up   Elevated ddimer -Negative CTA for PE   Hypokalemia -Replace, trend    DVT prophylaxis: Lovenox  Code Status: Full code Family Communication: No family at bedside Disposition Plan: Pending clinical improvement. Will need SNF on discharge    Consultants:   None  Procedures:   None   Antimicrobials:  Anti-infectives (From admission, onward)   Start     Dose/Rate Route Frequency Ordered Stop   12/09/17 0600  vancomycin (VANCOCIN) IVPB 1000 mg/200 mL premix  Status:  Discontinued     1,000 mg 200 mL/hr over 60 Minutes Intravenous Every 24 hours 12/08/17 0652 12/08/17 1417   12/08/17 2200  cefTRIAXone (ROCEPHIN) 2 g in sodium chloride 0.9 %  100 mL IVPB     2 g 200 mL/hr over 30 Minutes Intravenous Every 24 hours 12/08/17 1417     12/08/17 1430  azithromycin (ZITHROMAX) 500 mg in sodium chloride 0.9 % 250 mL IVPB     500 mg 250 mL/hr over 60 Minutes Intravenous Every 24 hours 12/08/17 1417     12/08/17 0700  piperacillin-tazobactam (ZOSYN) IVPB 3.375 g  Status:  Discontinued      3.375 g 12.5 mL/hr over 240 Minutes Intravenous Every 8 hours 12/08/17 0650 12/08/17 1417   12/08/17 0700  vancomycin (VANCOCIN) IVPB 1000 mg/200 mL premix     1,000 mg 200 mL/hr over 60 Minutes Intravenous  Once 12/08/17 0651 12/08/17 0808   12/07/17 2200  cefTRIAXone (ROCEPHIN) 1 g in sodium chloride 0.9 % 100 mL IVPB  Status:  Discontinued     1 g 200 mL/hr over 30 Minutes Intravenous Every 24 hours 12/07/17 0131 12/07/17 1449   12/07/17 1500  cefTRIAXone (ROCEPHIN) 2 g in sodium chloride 0.9 % 100 mL IVPB  Status:  Discontinued     2 g 200 mL/hr over 30 Minutes Intravenous Daily 12/07/17 1449 12/08/17 0654   12/06/17 2145  cefTRIAXone (ROCEPHIN) 1 g in sodium chloride 0.9 % 100 mL IVPB     1 g 200 mL/hr over 30 Minutes Intravenous  Once 12/06/17 2134 12/06/17 2234       Objective: Vitals:   12/09/17 0500 12/09/17 0600 12/09/17 0700 12/09/17 0800  BP: (!) 143/52 (!) 141/41 (!) 147/51   Pulse: 82 78 89   Resp: 18 19 (!) 23   Temp:    99.3 F (37.4 C)  TempSrc:    Oral  SpO2: 97% 96% 96%   Weight:      Height:        Intake/Output Summary (Last 24 hours) at 12/09/2017 0823 Last data filed at 12/09/2017 0600 Gross per 24 hour  Intake 1192.93 ml  Output 1500 ml  Net -307.07 ml   Filed Weights   12/06/17 2117 12/08/17 0500  Weight: 61.7 kg 65.8 kg    Examination: General exam: Appears calm and comfortable  Respiratory system: Clear to auscultation. Respiratory effort normal. On [place] O2, no conversational dyspnea  Cardiovascular system: S1 & S2 heard, RRR. +systolic murmur  Gastrointestinal system: Abdomen is nondistended, soft and nontender. No organomegaly or masses felt. Normal bowel sounds heard. Central nervous system: Alert and oriented. No focal neurological deficits. Extremities: Symmetric  Skin: No rashes, lesions or ulcers Psychiatry: Judgement and insight appear normal. Mood & affect appropriate.     Data Reviewed: I have personally reviewed following labs  and imaging studies  CBC: Recent Labs  Lab 12/06/17 2207 12/07/17 0557 12/08/17 0548 12/09/17 0324  WBC 22.3* 14.1* 15.9* 11.2*  NEUTROABS 21.1*  --   --   --   HGB 14.8 13.0 15.4 13.3  HCT 40.8 36.6* 43.0 37.9*  MCV 85.5 85.7 85.3 85.6  PLT 287 241 253 366   Basic Metabolic Panel: Recent Labs  Lab 12/06/17 2207 12/07/17 0557 12/08/17 0548 12/09/17 0324  NA 140 140 136 136  K 4.0 3.7 3.3* 3.3*  CL 103 105 100 101  CO2 25 26 21* 27  GLUCOSE 130* 103* 177* 124*  BUN 28* 20 16 12   CREATININE 0.83 0.65 0.76 0.63  CALCIUM 9.6 8.9 8.4* 8.1*   GFR: Estimated Creatinine Clearance: 58.5 mL/min (by C-G formula based on SCr of 0.63 mg/dL). Liver Function Tests: Recent Labs  Lab  12/06/17 2207 12/07/17 0557 12/08/17 0548 12/09/17 0324  AST 423* 253* 140* 75*  ALT 667* 505* 319* 195*  ALKPHOS 99 84 120 94  BILITOT 5.5* 5.8* 5.6* 6.3*  PROT 6.9 6.1* 6.7 5.8*  ALBUMIN 4.4 3.7 3.7 2.9*   No results for input(s): LIPASE, AMYLASE in the last 168 hours. No results for input(s): AMMONIA in the last 168 hours. Coagulation Profile: Recent Labs  Lab 12/07/17 0557  INR 1.80   Cardiac Enzymes: Recent Labs  Lab 12/06/17 2207 12/06/17 2333 12/07/17 0557 12/07/17 1241 12/08/17 0548  CKTOTAL 1,430*  --  1,505*  --  732*  CKMB  --   --  9.5*  --   --   TROPONINI  --  0.58* 0.66* 0.53*  --    BNP (last 3 results) No results for input(s): PROBNP in the last 8760 hours. HbA1C: No results for input(s): HGBA1C in the last 72 hours. CBG: No results for input(s): GLUCAP in the last 168 hours. Lipid Profile: No results for input(s): CHOL, HDL, LDLCALC, TRIG, CHOLHDL, LDLDIRECT in the last 72 hours. Thyroid Function Tests: Recent Labs    12/06/17 2333  TSH 0.861   Anemia Panel: No results for input(s): VITAMINB12, FOLATE, FERRITIN, TIBC, IRON, RETICCTPCT in the last 72 hours. Sepsis Labs: Recent Labs  Lab 12/06/17 2216 12/07/17 0015  LATICACIDVEN 3.48* 4.09*     Recent Results (from the past 240 hour(s))  Blood culture (routine x 2)     Status: Abnormal (Preliminary result)   Collection Time: 12/06/17 10:06 PM  Result Value Ref Range Status   Specimen Description   Final    BLOOD LEFT FOREARM Performed at [facility] [address], [place], [place] [address]    Special Requests   Final    BOTTLES DRAWN AEROBIC AND ANAEROBIC Blood Culture adequate volume Performed at [facility] [address] [place], [place] [address]    Culture  Setup Time GRAM NEGATIVE RODS AEROBIC BOTTLE ONLY   Final   Culture (A)  Final    KLEBSIELLA PNEUMONIAE SUSCEPTIBILITIES TO FOLLOW Performed at [facility], [place] [address], [place], [place] [address]    Report Status PENDING  Incomplete  Blood culture (routine x 2)     Status: Abnormal   Collection Time: 12/06/17 10:06 PM  Result Value Ref Range Status   Specimen Description   Final    BLOOD LEFT WRIST Performed at [facility], [place] [address] [place], [place] [address]    Special Requests   Final    BOTTLES DRAWN AEROBIC AND ANAEROBIC Blood Culture results may not be optimal due to an inadequate volume of blood received in culture bottles Performed at [facility] [address], [place], [place] [address]    Culture  Setup Time   Final    GRAM NEGATIVE RODS IN BOTH AEROBIC AND ANAEROBIC BOTTLES CRITICAL RESULT CALLED TO, READ BACK BY AND VERIFIED WITH: Missy Sabins [facility] [id number] FCP    Culture KLEBSIELLA PNEUMONIAE (A)  Final   Report Status 12/09/2017 FINAL  Final   Organism ID, Bacteria KLEBSIELLA PNEUMONIAE  Final      Susceptibility   Klebsiella pneumoniae - MIC*    AMPICILLIN >=32 RESISTANT Resistant     CEFAZOLIN <=4 SENSITIVE Sensitive     CEFEPIME <=1 SENSITIVE Sensitive     CEFTAZIDIME <=1 SENSITIVE Sensitive     CEFTRIAXONE <=1 SENSITIVE Sensitive     CIPROFLOXACIN <=  0.25 SENSITIVE Sensitive      GENTAMICIN <=1 SENSITIVE Sensitive     IMIPENEM 0.5 SENSITIVE Sensitive     TRIMETH/SULFA <=20 SENSITIVE Sensitive     AMPICILLIN/SULBACTAM 8 SENSITIVE Sensitive     PIP/TAZO <=4 SENSITIVE Sensitive     Extended ESBL NEGATIVE Sensitive     * KLEBSIELLA PNEUMONIAE  Blood Culture ID Panel (Reflexed)     Status: Abnormal   Collection Time: 12/06/17 10:06 PM  Result Value Ref Range Status   Enterococcus species NOT DETECTED NOT DETECTED Final   Listeria monocytogenes NOT DETECTED NOT DETECTED Final   Staphylococcus species NOT DETECTED NOT DETECTED Final   Staphylococcus aureus NOT DETECTED NOT DETECTED Final   Streptococcus species NOT DETECTED NOT DETECTED Final   Streptococcus agalactiae NOT DETECTED NOT DETECTED Final   Streptococcus pneumoniae NOT DETECTED NOT DETECTED Final   Streptococcus pyogenes NOT DETECTED NOT DETECTED Final   Acinetobacter baumannii NOT DETECTED NOT DETECTED Final   Enterobacteriaceae species DETECTED (A) NOT DETECTED Final    Comment: Enterobacteriaceae represent a large family of gram-negative bacteria, not a single organism. CRITICAL RESULT CALLED TO, READ BACK BY AND VERIFIED WITH: PHARMD D WOFFORD [phone] FCP    Enterobacter cloacae complex NOT DETECTED NOT DETECTED Final   Escherichia coli NOT DETECTED NOT DETECTED Final   Klebsiella oxytoca NOT DETECTED NOT DETECTED Final   Klebsiella pneumoniae DETECTED (A) NOT DETECTED Final    Comment: CRITICAL RESULT CALLED TO, READ BACK BY AND VERIFIED WITH: PHARMD D WOFFORD [id number] FCP    Proteus species NOT DETECTED NOT DETECTED Final   Serratia marcescens NOT DETECTED NOT DETECTED Final   Carbapenem resistance NOT DETECTED NOT DETECTED Final   Haemophilus influenzae NOT DETECTED NOT DETECTED Final   Neisseria meningitidis NOT DETECTED NOT DETECTED Final   Pseudomonas aeruginosa NOT DETECTED NOT DETECTED Final   Candida albicans NOT DETECTED NOT DETECTED Final   Candida glabrata NOT DETECTED NOT  DETECTED Final   Candida krusei NOT DETECTED NOT DETECTED Final   Candida parapsilosis NOT DETECTED NOT DETECTED Final   Candida tropicalis NOT DETECTED NOT DETECTED Final  Urine culture     Status: None   Collection Time: 12/06/17 11:33 PM  Result Value Ref Range Status   Specimen Description   Final    URINE, RANDOM Performed at [facility], [place] [address] [place], [place] [address]    Special Requests   Final    NONE Performed at [facility], [place] [address], [place], [place] [address]    Culture   Final    NO GROWTH Performed at [facility], [place] [address], [place], [place] [address]    Report Status 12/08/2017 FINAL  Final  MRSA PCR Screening     Status: None   Collection Time: 12/08/17  5:27 AM  Result Value Ref Range Status   MRSA by PCR NEGATIVE NEGATIVE Final    Comment:        The GeneXpert MRSA Assay (FDA approved for NASAL specimens only), is one component of a comprehensive MRSA colonization surveillance program. It is not intended to diagnose MRSA infection nor to guide or monitor treatment for MRSA infections. Performed at [facility], [place] [address], [place], [place] [address]        Radiology Studies: Ct Abdomen Pelvis W Contrast  Result Date: 12/08/2017 CLINICAL DATA:  Abdominal pain yesterday. Sepsis. Leukocytosis. EXAM: CT ABDOMEN AND PELVIS WITH CONTRAST TECHNIQUE: Multidetector CT  imaging of the abdomen and pelvis was performed using the standard protocol following bolus administration of intravenous contrast. CONTRAST:  121mL OMNIPAQUE IOHEXOL 300 MG/ML SOLN, 83mL ISOVUE-300 IOPAMIDOL (ISOVUE-300) INJECTION 61% COMPARISON:  Abdomen ultrasound and chest CT obtained yesterday. FINDINGS: Lower chest: Interval minimal bilateral pleural fluid and areas of dependent consolidation in both lower lobes and lingula. Hepatobiliary: Diffuse low density of the liver relative to the spleen. Normal  appearing gallbladder. Pancreas: Unremarkable. No pancreatic ductal dilatation or surrounding inflammatory changes. Spleen: Normal in size without focal abnormality. Adrenals/Urinary Tract: Foley catheter in the urinary bladder with associated air in the bladder. Normal appearing adrenal glands, a ureters and left kidney. Tiny area of low density in the anterolateral aspect of the peripheral cortex of the mid to lower right kidney. This is too small to characterize. Stomach/Bowel: Small hiatal hernia. Mildly prominent gas-filled sigmoid colon in the left mid upper abdomen. Unremarkable small bowel. No evidence of appendicitis. Vascular/Lymphatic: Atheromatous arterial calcifications without aneurysm. No enlarged lymph nodes. Reproductive: Prostate radiation seed implants. Other: Left groin surgical clips and left anterior pelvic wall hernia repair mesh. Small amount of free peritoneal fluid in the inferior pelvis. Musculoskeletal: Lumbar and lower thoracic spine degenerative changes. IMPRESSION: 1. Interval minimal bilateral pleural fluid and areas of dependent consolidation in both lower lobes and lingula. This could be due to pneumonia or atelectasis. 2. Small amount of free peritoneal fluid in the inferior pelvis. 3. Diffuse hepatic steatosis. 4. Tiny area of low density in the right kidney laterally, too small to characterize. Electronically Signed   By: Claudie Revering M.D.   On: 12/08/2017 15:55   Dg Chest Port 1 View  Result Date: 12/08/2017 CLINICAL DATA:  Respiratory distress. EXAM: PORTABLE CHEST 1 VIEW COMPARISON:  Chest radiograph December 06, 2017 and chest CT December 07, 2017 FINDINGS: New interstitial and LEFT greater than RIGHT alveolar airspace opacities. No pleural effusion. Cardiac silhouette is normal. Calcified aortic arch. No pneumothorax. Soft tissue planes and included osseous structures are non suspicious. IMPRESSION: New interstitial and alveolar airspace opacities most compatible with  bronchopneumonia. Aortic Atherosclerosis (ICD10-I70.0). Electronically Signed   By: Elon Alas M.D.   On: 12/08/2017 05:25   US Abdomen Limited Ruq  Result Date: 12/07/2017 CLINICAL DATA:  Abnormal liver enzymes EXAM: ULTRASOUND ABDOMEN LIMITED RIGHT UPPER QUADRANT COMPARISON:  None. FINDINGS: Gallbladder: No gallstones or wall thickening visualized. There is no pericholecystic fluid. No sonographic Murphy sign noted by sonographer. Common bile duct: Diameter: 6 mm. No intrahepatic or extrahepatic biliary duct dilatation. Liver: No focal lesion identified. Within normal limits in parenchymal echogenicity. Portal vein is patent on color Doppler imaging with normal direction of blood flow towards the liver. IMPRESSION: Study within normal limits. Electronically Signed   By: Lowella Grip III M.D.   On: 12/07/2017 10:10      Scheduled Meds: . chlorhexidine  15 mL Mouth Rinse BID  . cholecalciferol  2,000 Units Oral Daily  . enoxaparin (LOVENOX) injection  40 mg Subcutaneous QHS  . latanoprost  1 drop Both Eyes QHS  . LORazepam  0.5 mg Intravenous Once  . magnesium oxide  200 mg Oral Daily  . mouth rinse  15 mL Mouth Rinse q12n4p  . multivitamin with minerals  1 tablet Oral Daily  . ondansetron (ZOFRAN) IV  4 mg Intravenous Once   Continuous Infusions: . sodium chloride Stopped (12/08/17 2137)  . azithromycin Stopped (12/08/17 1600)  . cefTRIAXone (ROCEPHIN)  IV Stopped (12/08/17 2207)  LOS: 3 days    Time spent: 25 minutes   Dessa Phi, DO [facility] [email or web address] Password [facility] 12/09/2017, 8:23 AM

## 2017-12-09 NOTE — Progress Notes (Addendum)
Consult- SNF placement   CSW met with patient at beside, explain role and reason for visit Patient reports he plans to go home at discharge.  [facility] hospital liaison met with the patient to discuss home services. Patient is agreeable to [facility] services.  CSW will sign off.  Kathrin Greathouse, Marlinda Mike, MSW Clinical Social Worker  [phone] 12/09/2017  2:35 PM

## 2017-12-09 NOTE — Evaluation (Signed)
Occupational Therapy Evaluation Patient Details Name: Jeffrey Porter MRN: [id number] DOB: 01/31/1932 Today's Date: 12/09/2017    History of Present Illness Jeffrey Porter is a 84 yo male with past medical history significant for hyperlipidemia, cataract who presents after fall at home, on floor overnight. Admitted with UTI   Clinical Impression   Pt was admitted for the above. At baseline, he lives alone and is independent with basic ADLS and laundry. Family brings him food. He plans to d/c to daughter's house at d/c.  Currently, he needs min to mod A for adls and goals are for min guard to min A.  Follow Up Recommendations  Supervision/Assistance - 24 hour;Home health OT    Equipment Recommendations  (to be further assessed, ?3:1)    Recommendations for Other Services       Precautions / Restrictions Precautions Precautions: Fall Restrictions Weight Bearing Restrictions: No      Mobility Bed Mobility               General bed mobility comments: oob  Transfers   Equipment used: Rolling walker (2 wheeled)   Sit to Stand: Min assist              Balance     Sitting balance-Leahy Scale: Fair       Standing balance-Leahy Scale: Poor                             ADL either performed or assessed with clinical judgement   ADL Overall ADL's : Needs assistance/impaired Eating/Feeding: Independent   Grooming: Set up   Upper Body Bathing: Minimal assistance   Lower Body Bathing: Minimal assistance;Sit to/from stand   Upper Body Dressing : Minimal assistance(lines)   Lower Body Dressing: Moderate assistance;Sit to/from stand                 General ADL Comments: stood from chair; did not transfer as pt wanted to remain up--per PT festinating gait     Vision         Perception     Praxis      Pertinent Vitals/Pain Pain Assessment: Faces Faces Pain Scale: Hurts even more Pain Location: back and L side Pain Descriptors /  Indicators: Sore Pain Intervention(s): Limited activity within patient's tolerance;Monitored during session;Repositioned     Hand Dominance     Extremity/Trunk Assessment Upper Extremity Assessment Upper Extremity Assessment: Overall WFL for tasks assessed(RUE sore around IV site)           Communication Communication Communication: No difficulties   Cognition Arousal/Alertness: Awake/alert Behavior During Therapy: WFL for tasks assessed/performed;Flat affect Overall Cognitive Status: Within Functional Limits for tasks assessed                                     General Comments  pt states his back usually doesn't hurt.  VSS, had one episode skipped beat    Exercises     Shoulder Instructions      Home Living Family/patient expects to be discharged to:: Private residence Living Arrangements: Alone Available Help at Discharge: Family;Available 24 hours/day               Bathroom Shower/Tub: Walk-in shower(daughters)   Bathroom Toilet: Handicapped height(daughters)     Home Equipment: Environmental consultant - 4 wheels;Walker - standard;Cane - single point   Additional Comments: pt is in  the process of moving from his home in [facility] [place] [facility] to [place], near daughter. He plans to return to her house post d/c.  She and her husband work different shifts, so someone will be nearby      Prior Functioning/Environment Level of Independence: Independent with assistive device(s)        Comments: still drives; doesn't cook much; does own laundry        OT Problem List: Decreased activity tolerance;Pain;Decreased knowledge of use of DME or AE;Cardiopulmonary status limiting activity;Impaired balance (sitting and/or standing)      OT Treatment/Interventions: Self-care/ADL training;DME and/or AE instruction;Energy conservation;Patient/family education;Balance training;Therapeutic activities    OT Goals(Current goals can be found in the care plan section) Acute Rehab OT  Goals Patient Stated Goal: to move to house 2 doors from dtr OT Goal Formulation: With patient Time For Goal Achievement: 12/23/17 Potential to Achieve Goals: Good ADL Goals Pt Will Perform Grooming: with supervision;standing Pt Will Perform Lower Body Bathing: with supervision;sit to/from stand Pt Will Perform Lower Body Dressing: with min assist;sit to/from stand Pt Will Transfer to Toilet: with min guard assist;ambulating;regular height toilet;bedside commode  OT Frequency: Min 2X/week   Barriers to D/C:            Co-evaluation              AM-PAC PT "6 Clicks" Daily Activity     Outcome Measure Help from another person eating meals?: None Help from another person taking care of personal grooming?: A Little Help from another person toileting, which includes using toliet, bedpan, or urinal?: A Little Help from another person bathing (including washing, rinsing, drying)?: A Little Help from another person to put on and taking off regular upper body clothing?: A Little Help from another person to put on and taking off regular lower body clothing?: A Lot 6 Click Score: 18   End of Session Nurse Communication: (IV done and site sore)  Activity Tolerance: Patient tolerated treatment well Patient left: in chair;with call bell/phone within reach;with chair alarm set;with family/visitor present  OT Visit Diagnosis: Muscle weakness (generalized) (M62.81)                Time: 1510-1530 OT Time Calculation (min): 20 min Charges:  OT General Charges $OT Visit: 1 Visit OT Evaluation $OT Eval Moderate Complexity: 1 [address] OTR/L [phone] 12/09/2017  Jeffrey Porter 12/09/2017, 3:40 PM

## 2017-12-10 LAB — BASIC METABOLIC PANEL
Anion gap: 9 (ref 5–15)
BUN: 13 mg/dL (ref 8–23)
CO2: 24 mmol/L (ref 22–32)
Calcium: 8.1 mg/dL — ABNORMAL LOW (ref 8.9–10.3)
Chloride: 99 mmol/L (ref 98–111)
Creatinine, Ser: 0.58 mg/dL — ABNORMAL LOW (ref 0.61–1.24)
GFR calc Af Amer: >60 mL/min (ref >60)
GFR calc non Af Amer: >60 mL/min (ref >60)
Glucose, Bld: 116 mg/dL — ABNORMAL HIGH (ref 70–99)
Potassium: 3.2 mmol/L — ABNORMAL LOW (ref 3.5–5.1)
Sodium: 132 mmol/L — ABNORMAL LOW (ref 135–145)

## 2017-12-10 LAB — HEPATIC FUNCTION PANEL
ALK PHOS: 113 U/L (ref 38–126)
ALT: 146 U/L — ABNORMAL HIGH (ref 0–44)
AST: 58 U/L — ABNORMAL HIGH (ref 15–41)
Albumin: 2.9 g/dL — ABNORMAL LOW (ref 3.5–5.0)
BILIRUBIN DIRECT: 4 mg/dL — AB (ref 0.0–0.2)
BILIRUBIN TOTAL: 6 mg/dL — AB (ref 0.3–1.2)
Indirect Bilirubin: 2 mg/dL — ABNORMAL HIGH (ref 0.3–0.9)
Total Protein: 5.8 g/dL — ABNORMAL LOW (ref 6.5–8.1)

## 2017-12-10 LAB — CBC
HCT: 35.8 % — ABNORMAL LOW (ref 39.0–52.0)
Hemoglobin: 12.8 g/dL — ABNORMAL LOW (ref 13.0–17.0)
MCH: 30.5 pg (ref 26.0–34.0)
MCHC: 35.8 g/dL (ref 30.0–36.0)
MCV: 85.4 fL (ref 78.0–100.0)
Platelets: 208 10*3/uL (ref 150–400)
RBC: 4.19 MIL/uL — ABNORMAL LOW (ref 4.22–5.81)
RDW: 13.3 % (ref 11.5–15.5)
WBC: 12.1 10*3/uL — ABNORMAL HIGH (ref 4.0–10.5)

## 2017-12-10 LAB — MAGNESIUM: MAGNESIUM: 2 mg/dL (ref 1.7–2.4)

## 2017-12-10 MED ORDER — NIACIN 100 MG PO TABS
100.0000 mg | ORAL_TABLET | Freq: Every day | ORAL | Status: DC
  Administered 2017-12-10 – 2017-12-14 (×5): 100 mg via ORAL
  Filled 2017-12-10 (×6): qty 1

## 2017-12-10 MED ORDER — NIACIN 100 MG PO TABS: 100.0000 mg | ORAL_TABLET | Freq: Every day | ORAL | Status: DC

## 2017-12-10 MED ORDER — POTASSIUM CHLORIDE CRYS ER 20 MEQ PO TBCR
40.0000 meq | EXTENDED_RELEASE_TABLET | Freq: Two times a day (BID) | ORAL | Status: DC
  Filled 2017-12-10: qty 2

## 2017-12-10 MED ORDER — HYDRALAZINE HCL 20 MG/ML IJ SOLN
5.0000 mg | INTRAMUSCULAR | Status: DC | PRN
  Administered 2017-12-10: 5 mg via INTRAVENOUS
  Filled 2017-12-10: qty 1

## 2017-12-10 MED ORDER — POTASSIUM CHLORIDE 20 MEQ/15ML (10%) PO SOLN
40.0000 meq | Freq: Two times a day (BID) | ORAL | Status: AC
  Administered 2017-12-10: 40 meq via ORAL
  Filled 2017-12-10 (×2): qty 30

## 2017-12-10 MED ORDER — AMLODIPINE BESYLATE 5 MG PO TABS
5.0000 mg | ORAL_TABLET | Freq: Every day | ORAL | Status: DC
  Administered 2017-12-10 – 2017-12-15 (×6): 5 mg via ORAL
  Filled 2017-12-10 (×6): qty 1

## 2017-12-10 NOTE — Progress Notes (Signed)
PROGRESS NOTE    Jeffrey Porter  XBJ:[id number] DOB: November 11, 1931 DOA: 12/06/2017 PCP: [name], MD     Brief Narrative:  Jeffrey Porter is a 84 yo male with past medical history significant for hyperlipidemia, cataract who presents after fall at home.  He states that he went to the restroom to vomit, then while getting up, he lost his balance and fell.  He denies any loss of consciousness.  He states that it took him about 12 hours to finally be able to get up.  He lives alone.  He apparently went to urgent care, was sent then to the emergency department.  In the emergency department, his CK was 1430, lactic acid 3.48, WBC 22.3.  He was apparently diagnosed with a urinary tract infection at urgent care. Rapid response called overnight 8/15 due to acute respiratory distress.  Patient was found to be satting in the 60s on room air.  He did improve with nonrebreather although had persistent increased work of breathing.  He was transferred to stepdown unit and placed on BiPAP.  He was given IV Lasix with over 1 L diuresis with improvement in respiration.  He was also started on IV vancomycin, Zosyn for concern of HCAP.   New events last 24 hours / Subjective: Patient continues to improve.  He is on room air this morning without complaints of shortness of breath.  Denies any chest pain or abdominal pain.  Has been tolerating diet.  Had fever 102.2 overnight.  Patient is refusing skilled nursing facility placement, states that he will go home with his daughter.  Assessment & Plan:   Principal Problem:   Sepsis ([place]) Active Problems:   Acute lower UTI   Tachycardia   Leukocytosis   Acute respiratory failure with hypoxia (HCC)   Paroxysmal supraventricular tachycardia (HCC)  Fall at home without loss of consciousness -PT OT evaluation, recommending SNF, patient declining SNF.  Has decided to go home with his daughter with home health PT.  Rhabdomyolysis -Resolved  Sepsis secondary to Klebsiella  bacteremia  -I am unable to locate urinalysis result (completed at urgent care).  Per ED physician documentation, patient was diagnosed with UTI at urgent care. Urine culture negative, no growth -No intra-abdominal source of infection found on CT A/P  -Possibly transient bacteremia from urinary source?  -Continue Rocephin  -Repeat blood culture 8/15 pending clearance of bacteremia  -Continues to have fevers, overnight 102.45F  Acute hypoxemic respiratory failure secondary to CAP  -CXR revealed interstitial and alveolar airspace opacities most compatible with bronchopneumonia. Started on vanco/zosyn --> Now on rocephin/azithromycin  -Required BiPAP overnight 8/15, now on room air    Elevated transaminase -Likely secondary to dehydration -Right upper quadrant ultrasound negative -CT A/P showed diffuse hepatosteatosis  -Acute hepatitis panel negative -LFT trending down, would expect his bilirubin levels to trend down shortly   Elevated troponin -Likely secondary to demand ischemia, patient currently denies any chest pain -Troponin peak at 0.66, echocardiogram with no wall motion abnormalities  Aortic stenosis -Need outpatient follow up   Elevated ddimer -Negative CTA for PE   Hypokalemia -Replace, trend   Essential hypertension -Start Norvasc   DVT prophylaxis: Lovenox  Code Status: Full code Family Communication: No family at bedside, spoke with daughter over the phone  Disposition Plan: Pending clinical improvement. Declined SNF, will go home with daughter and home health PT. Transfer to med surg today.    Consultants:   None  Procedures:   None   Antimicrobials:  Anti-infectives (From  admission, onward)   Start     Dose/Rate Route Frequency Ordered Stop   12/09/17 0600  vancomycin (VANCOCIN) IVPB 1000 mg/200 mL premix  Status:  Discontinued     1,000 mg 200 mL/hr over 60 Minutes Intravenous Every 24 hours 12/08/17 0652 12/08/17 1417   12/08/17 2200  cefTRIAXone  (ROCEPHIN) 2 g in sodium chloride 0.9 % 100 mL IVPB     2 g 200 mL/hr over 30 Minutes Intravenous Every 24 hours 12/08/17 1417     12/08/17 1430  azithromycin (ZITHROMAX) 500 mg in sodium chloride 0.9 % 250 mL IVPB     500 mg 250 mL/hr over 60 Minutes Intravenous Every 24 hours 12/08/17 1417     12/08/17 0700  piperacillin-tazobactam (ZOSYN) IVPB 3.375 g  Status:  Discontinued     3.375 g 12.5 mL/hr over 240 Minutes Intravenous Every 8 hours 12/08/17 0650 12/08/17 1417   12/08/17 0700  vancomycin (VANCOCIN) IVPB 1000 mg/200 mL premix     1,000 mg 200 mL/hr over 60 Minutes Intravenous  Once 12/08/17 0651 12/08/17 0808   12/07/17 2200  cefTRIAXone (ROCEPHIN) 1 g in sodium chloride 0.9 % 100 mL IVPB  Status:  Discontinued     1 g 200 mL/hr over 30 Minutes Intravenous Every 24 hours 12/07/17 0131 12/07/17 1449   12/07/17 1500  cefTRIAXone (ROCEPHIN) 2 g in sodium chloride 0.9 % 100 mL IVPB  Status:  Discontinued     2 g 200 mL/hr over 30 Minutes Intravenous Daily 12/07/17 1449 12/08/17 0654   12/06/17 2145  cefTRIAXone (ROCEPHIN) 1 g in sodium chloride 0.9 % 100 mL IVPB     1 g 200 mL/hr over 30 Minutes Intravenous  Once 12/06/17 2134 12/06/17 2234       Objective: Vitals:   12/10/17 0700 12/10/17 0800 12/10/17 0900 12/10/17 1000  BP: (!) 171/62 138/63 (!) 169/52 (!) 155/61  Pulse: 84 96 91 87  Resp: 18 14 (!) 25 20  Temp:  98.6 F (37 C)    TempSrc:  Oral    SpO2: 99% 94% 92% 94%  Weight:      Height:        Intake/Output Summary (Last 24 hours) at 12/10/2017 1121 Last data filed at 12/10/2017 1000 Gross per 24 hour  Intake 337.01 ml  Output 2075 ml  Net -1737.99 ml   Filed Weights   12/06/17 2117 12/08/17 0500 12/10/17 0500  Weight: 61.7 kg 65.8 kg 63 kg    Examination: General exam: Appears calm and comfortable  Respiratory system: Crackles right base. Respiratory effort normal. On room air  Cardiovascular system: S1 & S2 heard, RRR. +systolic murmur    Gastrointestinal system: Abdomen is nondistended, soft and nontender. No organomegaly or masses felt. Normal bowel sounds heard. Central nervous system: Alert and oriented. No focal neurological deficits. Extremities: Symmetric 5 x 5 power. Skin: No rashes, lesions or ulcers Psychiatry: Judgement and insight appear normal. Mood & affect appropriate.    Data Reviewed: I have personally reviewed following labs and imaging studies  CBC: Recent Labs  Lab 12/06/17 2207 12/07/17 0557 12/08/17 0548 12/09/17 0324 12/10/17 0322  WBC 22.3* 14.1* 15.9* 11.2* 12.1*  NEUTROABS 21.1*  --   --   --   --   HGB 14.8 13.0 15.4 13.3 12.8*  HCT 40.8 36.6* 43.0 37.9* 35.8*  MCV 85.5 85.7 85.3 85.6 85.4  PLT 287 241 253 211 703   Basic Metabolic Panel: Recent Labs  Lab 12/06/17 2207 12/07/17  2595 12/08/17 0548 12/09/17 0324 12/10/17 0322  NA 140 140 136 136 132*  K 4.0 3.7 3.3* 3.3* 3.2*  CL 103 105 100 101 99  CO2 25 26 21* 27 24  GLUCOSE 130* 103* 177* 124* 116*  BUN 28* 20 16 12 13   CREATININE 0.83 0.65 0.76 0.63 0.58*  CALCIUM 9.6 8.9 8.4* 8.1* 8.1*  MG  --   --   --   --  2.0   GFR: Estimated Creatinine Clearance: 54 mL/min (A) (by C-G formula based on SCr of 0.58 mg/dL (L)). Liver Function Tests: Recent Labs  Lab 12/06/17 2207 12/07/17 0557 12/08/17 0548 12/09/17 0324 12/10/17 0322  AST 423* 253* 140* 75* 58*  ALT 667* 505* 319* 195* 146*  ALKPHOS 99 84 120 94 113  BILITOT 5.5* 5.8* 5.6* 6.3* 6.0*  PROT 6.9 6.1* 6.7 5.8* 5.8*  ALBUMIN 4.4 3.7 3.7 2.9* 2.9*   No results for input(s): LIPASE, AMYLASE in the last 168 hours. No results for input(s): AMMONIA in the last 168 hours. Coagulation Profile: Recent Labs  Lab 12/07/17 0557  INR 1.80   Cardiac Enzymes: Recent Labs  Lab 12/06/17 2207 12/06/17 2333 12/07/17 0557 12/07/17 1241 12/08/17 0548  CKTOTAL 1,430*  --  1,505*  --  732*  CKMB  --   --  9.5*  --   --   TROPONINI  --  0.58* 0.66* 0.53*  --    BNP  (last 3 results) No results for input(s): PROBNP in the last 8760 hours. HbA1C: No results for input(s): HGBA1C in the last 72 hours. CBG: No results for input(s): GLUCAP in the last 168 hours. Lipid Profile: No results for input(s): CHOL, HDL, LDLCALC, TRIG, CHOLHDL, LDLDIRECT in the last 72 hours. Thyroid Function Tests: No results for input(s): TSH, T4TOTAL, FREET4, T3FREE, THYROIDAB in the last 72 hours. Anemia Panel: No results for input(s): VITAMINB12, FOLATE, FERRITIN, TIBC, IRON, RETICCTPCT in the last 72 hours. Sepsis Labs: Recent Labs  Lab 12/06/17 2216 12/07/17 0015  LATICACIDVEN 3.48* 4.09*    Recent Results (from the past 240 hour(s))  Blood culture (routine x 2)     Status: Abnormal   Collection Time: 12/06/17 10:06 PM  Result Value Ref Range Status   Specimen Description   Final    BLOOD LEFT FOREARM Performed at [facility] [address] [place], [place] [address]    Special Requests   Final    BOTTLES DRAWN AEROBIC AND ANAEROBIC Blood Culture adequate volume Performed at [facility] [address], [place], [place] [address]    Culture  Setup Time GRAM NEGATIVE RODS AEROBIC BOTTLE ONLY   Final   Culture (A)  Final    KLEBSIELLA PNEUMONIAE SUSCEPTIBILITIES PERFORMED ON PREVIOUS CULTURE WITHIN THE LAST 5 DAYS. Performed at [facility], [place] [address], [place], [place] [address]    Report Status 12/09/2017 FINAL  Final  Blood culture (routine x 2)     Status: Abnormal   Collection Time: 12/06/17 10:06 PM  Result Value Ref Range Status   Specimen Description   Final    BLOOD LEFT WRIST Performed at [facility], [place] [address], [place], [place] [address]    Special Requests   Final    BOTTLES DRAWN AEROBIC AND ANAEROBIC Blood Culture results may not be optimal due to an inadequate volume of blood received in culture bottles Performed at [facility] [address]  [place], [place]  [address]    Culture  Setup Time   Final    GRAM NEGATIVE RODS IN BOTH AEROBIC AND ANAEROBIC BOTTLES CRITICAL RESULT CALLED TO, READ BACK BY AND VERIFIED WITH: Missy Sabins [facility] [phone] FCP    Culture KLEBSIELLA PNEUMONIAE (A)  Final   Report Status 12/09/2017 FINAL  Final   Organism ID, Bacteria KLEBSIELLA PNEUMONIAE  Final      Susceptibility   Klebsiella pneumoniae - MIC*    AMPICILLIN >=32 RESISTANT Resistant     CEFAZOLIN <=4 SENSITIVE Sensitive     CEFEPIME <=1 SENSITIVE Sensitive     CEFTAZIDIME <=1 SENSITIVE Sensitive     CEFTRIAXONE <=1 SENSITIVE Sensitive     CIPROFLOXACIN <=0.25 SENSITIVE Sensitive     GENTAMICIN <=1 SENSITIVE Sensitive     IMIPENEM 0.5 SENSITIVE Sensitive     TRIMETH/SULFA <=20 SENSITIVE Sensitive     AMPICILLIN/SULBACTAM 8 SENSITIVE Sensitive     PIP/TAZO <=4 SENSITIVE Sensitive     Extended ESBL NEGATIVE Sensitive     * KLEBSIELLA PNEUMONIAE  Blood Culture ID Panel (Reflexed)     Status: Abnormal   Collection Time: 12/06/17 10:06 PM  Result Value Ref Range Status   Enterococcus species NOT DETECTED NOT DETECTED Final   Listeria monocytogenes NOT DETECTED NOT DETECTED Final   Staphylococcus species NOT DETECTED NOT DETECTED Final   Staphylococcus aureus NOT DETECTED NOT DETECTED Final   Streptococcus species NOT DETECTED NOT DETECTED Final   Streptococcus agalactiae NOT DETECTED NOT DETECTED Final   Streptococcus pneumoniae NOT DETECTED NOT DETECTED Final   Streptococcus pyogenes NOT DETECTED NOT DETECTED Final   Acinetobacter baumannii NOT DETECTED NOT DETECTED Final   Enterobacteriaceae species DETECTED (A) NOT DETECTED Final    Comment: Enterobacteriaceae represent a large family of gram-negative bacteria, not a single organism. CRITICAL RESULT CALLED TO, READ BACK BY AND VERIFIED WITH: PHARMD D WOFFORD [phone] FCP    Enterobacter cloacae complex NOT DETECTED NOT DETECTED Final   Escherichia coli NOT DETECTED NOT DETECTED  Final   Klebsiella oxytoca NOT DETECTED NOT DETECTED Final   Klebsiella pneumoniae DETECTED (A) NOT DETECTED Final    Comment: CRITICAL RESULT CALLED TO, READ BACK BY AND VERIFIED WITH: PHARMD D WOFFORD [phone] FCP    Proteus species NOT DETECTED NOT DETECTED Final   Serratia marcescens NOT DETECTED NOT DETECTED Final   Carbapenem resistance NOT DETECTED NOT DETECTED Final   Haemophilus influenzae NOT DETECTED NOT DETECTED Final   Neisseria meningitidis NOT DETECTED NOT DETECTED Final   Pseudomonas aeruginosa NOT DETECTED NOT DETECTED Final   Candida albicans NOT DETECTED NOT DETECTED Final   Candida glabrata NOT DETECTED NOT DETECTED Final   Candida krusei NOT DETECTED NOT DETECTED Final   Candida parapsilosis NOT DETECTED NOT DETECTED Final   Candida tropicalis NOT DETECTED NOT DETECTED Final  Urine culture     Status: None   Collection Time: 12/06/17 11:33 PM  Result Value Ref Range Status   Specimen Description   Final    URINE, RANDOM Performed at [facility], [place] [address] [place], [place] [address]    Special Requests   Final    NONE Performed at [facility], [place] [address] [place], [place] [address]    Culture   Final    NO GROWTH Performed at [facility], [place] [address] [place],  [address]    Report Status 12/08/2017 FINAL  Final  MRSA PCR Screening     Status:  None   Collection Time: 12/08/17  5:27 AM  Result Value Ref Range Status   MRSA by PCR NEGATIVE NEGATIVE Final    Comment:        The GeneXpert MRSA Assay (FDA approved for NASAL specimens only), is one component of a comprehensive MRSA colonization surveillance program. It is not intended to diagnose MRSA infection nor to guide or monitor treatment for MRSA infections. Performed at [facility], [place] [address] [place], [place] [address]   Culture, blood (routine x 2)     Status: None (Preliminary result)   Collection  Time: 12/08/17 12:24 PM  Result Value Ref Range Status   Specimen Description   Final    BLOOD RIGHT ANTECUBITAL Performed at [facility] [address], [place], [place] [address]    Special Requests   Final    BOTTLES DRAWN AEROBIC AND ANAEROBIC Blood Culture adequate volume Performed at [facility] [address], [place],  [place] [address]    Culture   Final    NO GROWTH < 24 HOURS Performed at [facility] [address], [place], [place] [address]    Report Status PENDING  Incomplete  Culture, blood (routine x 2)     Status: None (Preliminary result)   Collection Time: 12/08/17 12:25 PM  Result Value Ref Range Status   Specimen Description   Final    BLOOD BLOOD RIGHT ARM Performed at [facility] [address], [place], [place] [address]    Special Requests   Final    BOTTLES DRAWN AEROBIC ONLY Blood Culture adequate volume Performed at [facility] [address] [place], [place] [address]    Culture   Final    NO GROWTH < 24 HOURS Performed at [facility] [address], [place],  [address]    Report Status PENDING  Incomplete       Radiology Studies: Ct Abdomen Pelvis W Contrast  Result Date: 12/08/2017 CLINICAL DATA:  Abdominal pain yesterday. Sepsis. Leukocytosis. EXAM: CT ABDOMEN AND PELVIS WITH CONTRAST TECHNIQUE: Multidetector CT imaging of the abdomen and pelvis was performed using the standard protocol following bolus administration of intravenous contrast. CONTRAST:  136mL OMNIPAQUE IOHEXOL 300 MG/ML SOLN, 58mL ISOVUE-300 IOPAMIDOL (ISOVUE-300) INJECTION 61% COMPARISON:  Abdomen ultrasound and chest CT obtained yesterday. FINDINGS: Lower chest: Interval minimal bilateral pleural fluid and areas of dependent consolidation in both lower lobes and lingula. Hepatobiliary: Diffuse low density of the liver relative to the spleen. Normal appearing gallbladder.  Pancreas: Unremarkable. No pancreatic ductal dilatation or surrounding inflammatory changes. Spleen: Normal in size without focal abnormality. Adrenals/Urinary Tract: Foley catheter in the urinary bladder with associated air in the bladder. Normal appearing adrenal glands, a ureters and left kidney. Tiny area of low density in the anterolateral aspect of the peripheral cortex of the mid to lower right kidney. This is too small to characterize. Stomach/Bowel: Small hiatal hernia. Mildly prominent gas-filled sigmoid colon in the left mid upper abdomen. Unremarkable small bowel. No evidence of appendicitis. Vascular/Lymphatic: Atheromatous arterial calcifications without aneurysm. No enlarged lymph nodes. Reproductive: Prostate radiation seed implants. Other: Left groin surgical clips and left anterior pelvic wall hernia repair mesh. Small amount of free peritoneal fluid in the inferior pelvis. Musculoskeletal: Lumbar and lower thoracic spine degenerative changes. IMPRESSION: 1. Interval minimal bilateral pleural fluid and areas of dependent consolidation in both lower lobes and lingula. This could be due to pneumonia or atelectasis.  2. Small amount of free peritoneal fluid in the inferior pelvis. 3. Diffuse hepatic steatosis. 4. Tiny area of low density in the right kidney laterally, too small to characterize. Electronically Signed   By: Claudie Revering M.D.   On: 12/08/2017 15:55      Scheduled Meds: . amLODipine  5 mg Oral Daily  . chlorhexidine  15 mL Mouth Rinse BID  . cholecalciferol  2,000 Units Oral Daily  . enoxaparin (LOVENOX) injection  40 mg Subcutaneous QHS  . latanoprost  1 drop Both Eyes QHS  . magnesium oxide  200 mg Oral Daily  . mouth rinse  15 mL Mouth Rinse q12n4p  . multivitamin with minerals  1 tablet Oral Daily  . niacin  100 mg Oral QHS  . ondansetron (ZOFRAN) IV  4 mg Intravenous Once  . potassium chloride  40 mEq Oral BID   Continuous Infusions: . sodium chloride Stopped  (12/08/17 2137)  . azithromycin Stopped (12/09/17 1501)  . cefTRIAXone (ROCEPHIN)  IV Stopped (12/09/17 2148)     LOS: 4 days    Time spent: 25 minutes   Dessa Phi, DO [facility] [email or web address] Password [facility] 12/10/2017, 11:21 AM

## 2017-12-10 NOTE — Progress Notes (Signed)
Pt A&O X 3, responds appropriately to questions asked. No signs of resp distress noted. Resp even and unlabored. No acute distress noted. Sitting in chair at bedside.

## 2017-12-11 LAB — CBC
HCT: 38.7 % — ABNORMAL LOW (ref 39.0–52.0)
Hemoglobin: 14 g/dL (ref 13.0–17.0)
MCH: 30.6 pg (ref 26.0–34.0)
MCHC: 36.2 g/dL — ABNORMAL HIGH (ref 30.0–36.0)
MCV: 84.5 fL (ref 78.0–100.0)
Platelets: 248 10*3/uL (ref 150–400)
RBC: 4.58 MIL/uL (ref 4.22–5.81)
RDW: 13.4 % (ref 11.5–15.5)
WBC: 11.1 10*3/uL — ABNORMAL HIGH (ref 4.0–10.5)

## 2017-12-11 LAB — MAGNESIUM: MAGNESIUM: 2.3 mg/dL (ref 1.7–2.4)

## 2017-12-11 LAB — BASIC METABOLIC PANEL
Anion gap: 9 (ref 5–15)
BUN: 14 mg/dL (ref 8–23)
CO2: 24 mmol/L (ref 22–32)
Calcium: 8.5 mg/dL — ABNORMAL LOW (ref 8.9–10.3)
Chloride: 100 mmol/L (ref 98–111)
Creatinine, Ser: 0.5 mg/dL — ABNORMAL LOW (ref 0.61–1.24)
GFR calc Af Amer: >60 mL/min (ref >60)
GFR calc non Af Amer: >60 mL/min (ref >60)
Glucose, Bld: 99 mg/dL (ref 70–99)
Potassium: 3.5 mmol/L (ref 3.5–5.1)
Sodium: 133 mmol/L — ABNORMAL LOW (ref 135–145)

## 2017-12-11 LAB — HEPATIC FUNCTION PANEL
ALBUMIN: 2.9 g/dL — AB (ref 3.5–5.0)
ALK PHOS: 140 U/L — AB (ref 38–126)
ALT: 119 U/L — ABNORMAL HIGH (ref 0–44)
AST: 54 U/L — ABNORMAL HIGH (ref 15–41)
Bilirubin, Direct: 1.8 mg/dL — ABNORMAL HIGH (ref 0.0–0.2)
Indirect Bilirubin: 1.6 mg/dL — ABNORMAL HIGH (ref 0.3–0.9)
TOTAL PROTEIN: 6.1 g/dL — AB (ref 6.5–8.1)
Total Bilirubin: 3.4 mg/dL — ABNORMAL HIGH (ref 0.3–1.2)

## 2017-12-11 NOTE — Progress Notes (Signed)
Occupational Therapy Treatment Patient Details Name: Jeffrey Porter MRN: [id number] DOB: 31-Oct-1931 Today's Date: 12/11/2017    History of present illness Jeffrey Porter is a 84 yo male with past medical history significant for hyperlipidemia, cataract who presents after fall at home, on floor overnight. Admitted with UTI,  rapid response called 8/15 AM for SOB and  incr. HR      Follow Up Recommendations   ST SNF   Equipment Recommendations  (to be further assessed, ?3:1)    Recommendations for Other Services      Precautions / Restrictions         Mobility Bed Mobility               General bed mobility comments: oob  Transfers   Equipment used: Rolling walker (2 wheeled) Transfers: Sit to/from [facility] Sit to Stand: Min assist;Mod assist         General transfer comment: stands  with support of RW    Balance           Standing balance support: During functional activity;Bilateral upper extremity supported Standing balance-Leahy Scale: Poor                             ADL either performed or assessed with clinical judgement   ADL Overall ADL's : Needs assistance/impaired Eating/Feeding: Sitting   Grooming: Standing;Min guard               Lower Body Dressing: Moderate assistance;Sit to/from stand   Toilet Transfer: Minimal assistance;Ambulation;RW;Comfort height toilet   Toileting- Clothing Manipulation and Hygiene: Moderate assistance;Sit to/from stand               Vision Patient Visual Report: No change from baseline            Cognition Arousal/Alertness: Awake/alert Behavior During Therapy: WFL for tasks assessed/performed;Flat affect Overall Cognitive Status: Within Functional Limits for tasks assessed                                                General Comments Pt not safe to Dc home - will need ST SNF    Pertinent Vitals/ Pain       Pain Assessment: No/denies  pain  Home Living                                              Frequency  Min 2X/week        Progress Toward Goals  OT Goals(current goals can now be found in the care plan section)  Progress towards OT goals: Progressing toward goals     Plan Discharge plan remains appropriate    Co-evaluation                 AM-PAC PT "6 Clicks" Daily Activity     Outcome Measure   Help from another person eating meals?: None Help from another person taking care of personal grooming?: A Little Help from another person toileting, which includes using toliet, bedpan, or urinal?: A Little Help from another person bathing (including washing, rinsing, drying)?: A Little Help from another person to put on and taking off regular upper body clothing?: A Little  Help from another person to put on and taking off regular lower body clothing?: A Lot 6 Click Score: 18    End of Session    OT Visit Diagnosis: Muscle weakness (generalized) (M62.81)   Activity Tolerance Patient tolerated treatment well   Patient Left in chair;with call bell/phone within reach;with chair alarm set;with family/visitor present   Nurse Communication Mobility status(IV done and site sore)        Time: 1345-1405 OT Time Calculation (min): 20 min  Charges: OT Evaluation $OT Eval Low Complexity: 1 Low OT Treatments $Self Care/Home Management : 8-22 mins  [place], [place]   Payton Mccallum D 12/11/2017, 2:34 PM

## 2017-12-11 NOTE — Progress Notes (Signed)
Pt daughter requests SW contact her for discussion of d/c plans and recommendations. [name] [phone].

## 2017-12-11 NOTE — Progress Notes (Signed)
PROGRESS NOTE    Jeffrey Porter  KDT:[id number] DOB: 1931-11-12 DOA: 12/06/2017 PCP: [name], MD     Brief Narrative:  Jeffrey Porter is a 84 yo male with past medical history significant for hyperlipidemia, cataract who presents after fall at home.  He states that he went to the restroom to vomit, then while getting up, he lost his balance and fell.  He denies any loss of consciousness.  He states that it took him about 12 hours to finally be able to get up.  He lives alone.  He apparently went to urgent care, was sent then to the emergency department.  In the emergency department, his CK was 1430, lactic acid 3.48, WBC 22.3.  He was apparently diagnosed with a urinary tract infection at urgent care. Rapid response called overnight 8/15 due to acute respiratory distress.  Patient was found to be satting in the 60s on room air.  He did improve with nonrebreather although had persistent increased work of breathing.  He was transferred to stepdown unit and placed on BiPAP.  He was given IV Lasix with over 1 L diuresis with improvement in respiration.  He was also started on IV vancomycin, Zosyn for concern of HCAP.   New events last 24 hours / Subjective: Remains on room air today. States breathing is fine. Finally ate a good meal yesterday. No fevers overnight.   Assessment & Plan:   Principal Problem:   Sepsis ([place]) Active Problems:   Acute lower UTI   Tachycardia   Leukocytosis   Acute respiratory failure with hypoxia (HCC)   Paroxysmal supraventricular tachycardia (HCC)  Fall at home without loss of consciousness -PT OT evaluation, recommending SNF, patient initially declined, but now interested in SNF again   Rhabdomyolysis -Resolved  Sepsis secondary to Klebsiella bacteremia  -I am unable to locate urinalysis result (completed at urgent care).  Per ED physician documentation, patient was diagnosed with UTI at urgent care. Urine culture negative, no growth -No intra-abdominal source  of infection found on CT A/P  -Possibly transient bacteremia from urinary source?  -Continue Rocephin  -Repeat blood culture 8/15 negative to date -No fevers overnight  Acute hypoxemic respiratory failure secondary to CAP  -CXR revealed interstitial and alveolar airspace opacities most compatible with bronchopneumonia. Started on vanco/zosyn --> Now on rocephin/azithromycin  -Required BiPAP overnight 8/15, now on room air    Elevated transaminase -Likely secondary to dehydration -Right upper quadrant ultrasound negative -CT A/P showed diffuse hepatosteatosis  -Acute hepatitis panel negative -LFT trending down, bilirubin trending down   Elevated troponin -Likely secondary to demand ischemia, patient currently denies any chest pain -Troponin peak at 0.66, echocardiogram with no wall motion abnormalities  Aortic stenosis -Need outpatient follow up   Elevated ddimer -Negative CTA for PE   Essential hypertension -Norvasc -BP stable this morning    DVT prophylaxis: Lovenox  Code Status: Full code Family Communication: No family at bedside Disposition Plan: Pending clinical improvement, likely discharge next 24-48 hours depending on SNF placement (initially refused, but now interested in looking into SNF)    Consultants:   None  Procedures:   None   Antimicrobials:  Anti-infectives (From admission, onward)   Start     Dose/Rate Route Frequency Ordered Stop   12/09/17 0600  vancomycin (VANCOCIN) IVPB 1000 mg/200 mL premix  Status:  Discontinued     1,000 mg 200 mL/hr over 60 Minutes Intravenous Every 24 hours 12/08/17 0652 12/08/17 1417   12/08/17 2200  cefTRIAXone (ROCEPHIN) 2  g in sodium chloride 0.9 % 100 mL IVPB     2 g 200 mL/hr over 30 Minutes Intravenous Every 24 hours 12/08/17 1417     12/08/17 1430  azithromycin (ZITHROMAX) 500 mg in sodium chloride 0.9 % 250 mL IVPB     500 mg 250 mL/hr over 60 Minutes Intravenous Every 24 hours 12/08/17 1417     12/08/17  0700  piperacillin-tazobactam (ZOSYN) IVPB 3.375 g  Status:  Discontinued     3.375 g 12.5 mL/hr over 240 Minutes Intravenous Every 8 hours 12/08/17 0650 12/08/17 1417   12/08/17 0700  vancomycin (VANCOCIN) IVPB 1000 mg/200 mL premix     1,000 mg 200 mL/hr over 60 Minutes Intravenous  Once 12/08/17 0651 12/08/17 0808   12/07/17 2200  cefTRIAXone (ROCEPHIN) 1 g in sodium chloride 0.9 % 100 mL IVPB  Status:  Discontinued     1 g 200 mL/hr over 30 Minutes Intravenous Every 24 hours 12/07/17 0131 12/07/17 1449   12/07/17 1500  cefTRIAXone (ROCEPHIN) 2 g in sodium chloride 0.9 % 100 mL IVPB  Status:  Discontinued     2 g 200 mL/hr over 30 Minutes Intravenous Daily 12/07/17 1449 12/08/17 0654   12/06/17 2145  cefTRIAXone (ROCEPHIN) 1 g in sodium chloride 0.9 % 100 mL IVPB     1 g 200 mL/hr over 30 Minutes Intravenous  Once 12/06/17 2134 12/06/17 2234       Objective: Vitals:   12/10/17 1200 12/10/17 1445 12/10/17 2106 12/11/17 0501  BP: (!) 166/59 (!) 150/63 (!) 146/68 136/71  Pulse: 89 99 (!) 104 91  Resp: (!) 26 (!) 22 16 14   Temp:  99.1 F (37.3 C) 98.8 F (37.1 C) 99.2 F (37.3 C)  TempSrc:  Oral Oral Oral  SpO2: 93%  99% 94%  Weight:    65.4 kg  Height:        Intake/Output Summary (Last 24 hours) at 12/11/2017 0959 Last data filed at 12/11/2017 0925 Gross per 24 hour  Intake 861.29 ml  Output 2375 ml  Net -1513.71 ml   Filed Weights   12/08/17 0500 12/10/17 0500 12/11/17 0501  Weight: 65.8 kg 63 kg 65.4 kg    Examination: General exam: Appears calm and comfortable  Respiratory system: Clear to auscultation. Respiratory effort normal. On room air, no conversational dyspnea  Cardiovascular system: S1 & S2 heard, RRR. +systolic murmur, no edema  Gastrointestinal system: Abdomen is nondistended, soft and nontender. No organomegaly or masses felt. Normal bowel sounds heard. Central nervous system: Alert and oriented. No focal neurological deficits. Extremities: Symmetric  5 x 5 power. Skin: No rashes, lesions or ulcers Psychiatry: Judgement and insight appear normal. Mood & affect appropriate.    Data Reviewed: I have personally reviewed following labs and imaging studies  CBC: Recent Labs  Lab 12/06/17 2207 12/07/17 0557 12/08/17 0548 12/09/17 0324 12/10/17 0322 12/11/17 0445  WBC 22.3* 14.1* 15.9* 11.2* 12.1* 11.1*  NEUTROABS 21.1*  --   --   --   --   --   HGB 14.8 13.0 15.4 13.3 12.8* 14.0  HCT 40.8 36.6* 43.0 37.9* 35.8* 38.7*  MCV 85.5 85.7 85.3 85.6 85.4 84.5  PLT 287 241 253 211 208 381   Basic Metabolic Panel: Recent Labs  Lab 12/07/17 0557 12/08/17 0548 12/09/17 0324 12/10/17 0322 12/11/17 0445  NA 140 136 136 132* 133*  K 3.7 3.3* 3.3* 3.2* 3.5  CL 105 100 101 99 100  CO2 26 21* 27 24  24  GLUCOSE 103* 177* 124* 116* 99  BUN 20 16 12 13 14   CREATININE 0.65 0.76 0.63 0.58* 0.50*  CALCIUM 8.9 8.4* 8.1* 8.1* 8.5*  MG  --   --   --  2.0 2.3   GFR: Estimated Creatinine Clearance: 54 mL/min (A) (by C-G formula based on SCr of 0.5 mg/dL (L)). Liver Function Tests: Recent Labs  Lab 12/07/17 0557 12/08/17 0548 12/09/17 0324 12/10/17 0322 12/11/17 0445  AST 253* 140* 75* 58* 54*  ALT 505* 319* 195* 146* 119*  ALKPHOS 84 120 94 113 140*  BILITOT 5.8* 5.6* 6.3* 6.0* 3.4*  PROT 6.1* 6.7 5.8* 5.8* 6.1*  ALBUMIN 3.7 3.7 2.9* 2.9* 2.9*   No results for input(s): LIPASE, AMYLASE in the last 168 hours. No results for input(s): AMMONIA in the last 168 hours. Coagulation Profile: Recent Labs  Lab 12/07/17 0557  INR 1.80   Cardiac Enzymes: Recent Labs  Lab 12/06/17 2207 12/06/17 2333 12/07/17 0557 12/07/17 1241 12/08/17 0548  CKTOTAL 1,430*  --  1,505*  --  732*  CKMB  --   --  9.5*  --   --   TROPONINI  --  0.58* 0.66* 0.53*  --    BNP (last 3 results) No results for input(s): PROBNP in the last 8760 hours. HbA1C: No results for input(s): HGBA1C in the last 72 hours. CBG: No results for input(s): GLUCAP in the  last 168 hours. Lipid Profile: No results for input(s): CHOL, HDL, LDLCALC, TRIG, CHOLHDL, LDLDIRECT in the last 72 hours. Thyroid Function Tests: No results for input(s): TSH, T4TOTAL, FREET4, T3FREE, THYROIDAB in the last 72 hours. Anemia Panel: No results for input(s): VITAMINB12, FOLATE, FERRITIN, TIBC, IRON, RETICCTPCT in the last 72 hours. Sepsis Labs: Recent Labs  Lab 12/06/17 2216 12/07/17 0015  LATICACIDVEN 3.48* 4.09*    Recent Results (from the past 240 hour(s))  Blood culture (routine x 2)     Status: Abnormal   Collection Time: 12/06/17 10:06 PM  Result Value Ref Range Status   Specimen Description   Final    BLOOD LEFT FOREARM Performed at [facility] [address], [place], [place] [address]    Special Requests   Final    BOTTLES DRAWN AEROBIC AND ANAEROBIC Blood Culture adequate volume Performed at [facility] [address] [place], [place] [address]    Culture  Setup Time GRAM NEGATIVE RODS AEROBIC BOTTLE ONLY   Final   Culture (A)  Final    KLEBSIELLA PNEUMONIAE SUSCEPTIBILITIES PERFORMED ON PREVIOUS CULTURE WITHIN THE LAST 5 DAYS. Performed at [facility], [place] [address] [place], [place] [address]    Report Status 12/09/2017 FINAL  Final  Blood culture (routine x 2)     Status: Abnormal   Collection Time: 12/06/17 10:06 PM  Result Value Ref Range Status   Specimen Description   Final    BLOOD LEFT WRIST Performed at [facility], [place] [address], [place], [place] [address]    Special Requests   Final    BOTTLES DRAWN AEROBIC AND ANAEROBIC Blood Culture results may not be optimal due to an inadequate volume of blood received in culture bottles Performed at [facility] [address], [place], [place] [address]    Culture  Setup Time   Final    GRAM NEGATIVE RODS IN BOTH AEROBIC AND ANAEROBIC BOTTLES CRITICAL RESULT CALLED TO, READ BACK BY AND VERIFIED WITH: PHARMD D  WOFFORD  [id number] FCP    Culture KLEBSIELLA PNEUMONIAE (A)  Final   Report Status 12/09/2017 FINAL  Final   Organism ID, Bacteria KLEBSIELLA PNEUMONIAE  Final      Susceptibility   Klebsiella pneumoniae - MIC*    AMPICILLIN >=32 RESISTANT Resistant     CEFAZOLIN <=4 SENSITIVE Sensitive     CEFEPIME <=1 SENSITIVE Sensitive     CEFTAZIDIME <=1 SENSITIVE Sensitive     CEFTRIAXONE <=1 SENSITIVE Sensitive     CIPROFLOXACIN <=0.25 SENSITIVE Sensitive     GENTAMICIN <=1 SENSITIVE Sensitive     IMIPENEM 0.5 SENSITIVE Sensitive     TRIMETH/SULFA <=20 SENSITIVE Sensitive     AMPICILLIN/SULBACTAM 8 SENSITIVE Sensitive     PIP/TAZO <=4 SENSITIVE Sensitive     Extended ESBL NEGATIVE Sensitive     * KLEBSIELLA PNEUMONIAE  Blood Culture ID Panel (Reflexed)     Status: Abnormal   Collection Time: 12/06/17 10:06 PM  Result Value Ref Range Status   Enterococcus species NOT DETECTED NOT DETECTED Final   Listeria monocytogenes NOT DETECTED NOT DETECTED Final   Staphylococcus species NOT DETECTED NOT DETECTED Final   Staphylococcus aureus NOT DETECTED NOT DETECTED Final   Streptococcus species NOT DETECTED NOT DETECTED Final   Streptococcus agalactiae NOT DETECTED NOT DETECTED Final   Streptococcus pneumoniae NOT DETECTED NOT DETECTED Final   Streptococcus pyogenes NOT DETECTED NOT DETECTED Final   Acinetobacter baumannii NOT DETECTED NOT DETECTED Final   Enterobacteriaceae species DETECTED (A) NOT DETECTED Final    Comment: Enterobacteriaceae represent a large family of gram-negative bacteria, not a single organism. CRITICAL RESULT CALLED TO, READ BACK BY AND VERIFIED WITH: PHARMD D WOFFORD [id number] FCP    Enterobacter cloacae complex NOT DETECTED NOT DETECTED Final   Escherichia coli NOT DETECTED NOT DETECTED Final   Klebsiella oxytoca NOT DETECTED NOT DETECTED Final   Klebsiella pneumoniae DETECTED (A) NOT DETECTED Final    Comment: CRITICAL RESULT CALLED TO, READ BACK BY AND VERIFIED  WITH: PHARMD D WOFFORD [phone] FCP    Proteus species NOT DETECTED NOT DETECTED Final   Serratia marcescens NOT DETECTED NOT DETECTED Final   Carbapenem resistance NOT DETECTED NOT DETECTED Final   Haemophilus influenzae NOT DETECTED NOT DETECTED Final   Neisseria meningitidis NOT DETECTED NOT DETECTED Final   Pseudomonas aeruginosa NOT DETECTED NOT DETECTED Final   Candida albicans NOT DETECTED NOT DETECTED Final   Candida glabrata NOT DETECTED NOT DETECTED Final   Candida krusei NOT DETECTED NOT DETECTED Final   Candida parapsilosis NOT DETECTED NOT DETECTED Final   Candida tropicalis NOT DETECTED NOT DETECTED Final  Urine culture     Status: None   Collection Time: 12/06/17 11:33 PM  Result Value Ref Range Status   Specimen Description   Final    URINE, RANDOM Performed at [facility], [place] [address], [place], [place] [address]    Special Requests   Final    NONE Performed at [facility] [place] [address] [place], [place] [address]    Culture   Final    NO GROWTH Performed at [facility], [place] [address] [place], [place] [address]    Report Status 12/08/2017 FINAL  Final  MRSA PCR Screening     Status: None   Collection Time: 12/08/17  5:27 AM  Result Value Ref Range Status   MRSA by PCR NEGATIVE NEGATIVE Final    Comment:        The GeneXpert  MRSA Assay (FDA approved for NASAL specimens only), is one component of a comprehensive MRSA colonization surveillance program. It is not intended to diagnose MRSA infection nor to guide or monitor treatment for MRSA infections. Performed at [facility], [place] [address], [place], [place] [address]   Culture, blood (routine x 2)     Status: None (Preliminary result)   Collection Time: 12/08/17 12:24 PM  Result Value Ref Range Status   Specimen Description   Final    BLOOD RIGHT ANTECUBITAL Performed at [facility] [address],  [place], [place] [address]    Special Requests   Final    BOTTLES DRAWN AEROBIC AND ANAEROBIC Blood Culture adequate volume Performed at [facility] [address], [place], [place] [address]    Culture   Final    NO GROWTH 2 DAYS Performed at [facility] [address] [place], [place] [address]    Report Status PENDING  Incomplete  Culture, blood (routine x 2)     Status: None (Preliminary result)   Collection Time: 12/08/17 12:25 PM  Result Value Ref Range Status   Specimen Description   Final    BLOOD BLOOD RIGHT ARM Performed at [facility] [address] [place], [place] [address]    Special Requests   Final    BOTTLES DRAWN AEROBIC ONLY Blood Culture adequate volume Performed at [facility] [address], [place], [place] [address]    Culture   Final    NO GROWTH 2 DAYS Performed at [facility] [address] [place],  [address]    Report Status PENDING  Incomplete       Radiology Studies: No results found.    Scheduled Meds: . amLODipine  5 mg Oral Daily  . chlorhexidine  15 mL Mouth Rinse BID  . cholecalciferol  2,000 Units Oral Daily  . enoxaparin (LOVENOX) injection  40 mg Subcutaneous QHS  . latanoprost  1 drop Both Eyes QHS  . magnesium oxide  200 mg Oral Daily  . mouth rinse  15 mL Mouth Rinse q12n4p  . multivitamin with minerals  1 tablet Oral Daily  . niacin  100 mg Oral Q1200  . ondansetron (ZOFRAN) IV  4 mg Intravenous Once   Continuous Infusions: . sodium chloride Stopped (12/10/17 1738)  . azithromycin 500 mg (12/10/17 1531)  . cefTRIAXone (ROCEPHIN)  IV 2 g (12/10/17 2339)     LOS: 5 days    Time spent: 25 minutes   Dessa Phi, DO [facility] [email or web address] Password [email or web address] 12/11/2017, 9:59 AM

## 2017-12-11 NOTE — Plan of Care (Signed)
Pt a&ox4; resting in bed. No complaints or needs expressed at this time. Will continue to monitor.

## 2017-12-12 LAB — CBC
HCT: 36.9 % — ABNORMAL LOW (ref 39.0–52.0)
Hemoglobin: 13 g/dL (ref 13.0–17.0)
MCH: 29.7 pg (ref 26.0–34.0)
MCHC: 35.2 g/dL (ref 30.0–36.0)
MCV: 84.4 fL (ref 78.0–100.0)
Platelets: 320 10*3/uL (ref 150–400)
RBC: 4.37 MIL/uL (ref 4.22–5.81)
RDW: 13.5 % (ref 11.5–15.5)
WBC: 10.1 10*3/uL (ref 4.0–10.5)

## 2017-12-12 LAB — HEPATIC FUNCTION PANEL
ALK PHOS: 156 U/L — AB (ref 38–126)
ALT: 103 U/L — AB (ref 0–44)
AST: 49 U/L — AB (ref 15–41)
Albumin: 2.9 g/dL — ABNORMAL LOW (ref 3.5–5.0)
BILIRUBIN DIRECT: 0.9 mg/dL — AB (ref 0.0–0.2)
BILIRUBIN TOTAL: 2.1 mg/dL — AB (ref 0.3–1.2)
Indirect Bilirubin: 1.2 mg/dL — ABNORMAL HIGH (ref 0.3–0.9)
Total Protein: 6.3 g/dL — ABNORMAL LOW (ref 6.5–8.1)

## 2017-12-12 LAB — MAGNESIUM: Magnesium: 2.2 mg/dL (ref 1.7–2.4)

## 2017-12-12 LAB — BASIC METABOLIC PANEL
Anion gap: 8 (ref 5–15)
BUN: 17 mg/dL (ref 8–23)
CO2: 26 mmol/L (ref 22–32)
Calcium: 8.6 mg/dL — ABNORMAL LOW (ref 8.9–10.3)
Chloride: 102 mmol/L (ref 98–111)
Creatinine, Ser: 0.55 mg/dL — ABNORMAL LOW (ref 0.61–1.24)
GFR calc Af Amer: >60 mL/min (ref >60)
GFR calc non Af Amer: >60 mL/min (ref >60)
Glucose, Bld: 108 mg/dL — ABNORMAL HIGH (ref 70–99)
Potassium: 3.3 mmol/L — ABNORMAL LOW (ref 3.5–5.1)
Sodium: 136 mmol/L (ref 135–145)

## 2017-12-12 MED ORDER — POTASSIUM CHLORIDE CRYS ER 20 MEQ PO TBCR
40.0000 meq | EXTENDED_RELEASE_TABLET | Freq: Once | ORAL | Status: AC
  Administered 2017-12-12: 40 meq via ORAL
  Filled 2017-12-12: qty 2

## 2017-12-12 NOTE — Progress Notes (Signed)
PROGRESS NOTE    Jeffrey Porter  JOI:[id number] DOB: [birth year] DOA: 12/06/2017 PCP: [name], MD     Brief Narrative:  Jeffrey Porter is a 84 yo male with past medical history significant for hyperlipidemia, cataract who presents after fall at home.  He states that he went to the restroom to vomit, then while getting up, he lost his balance and fell.  He denies any loss of consciousness.  He states that it took him about 12 hours to finally be able to get up.  He lives alone.  He apparently went to urgent care, was sent then to the emergency department.  In the emergency department, his CK was 1430, lactic acid 3.48, WBC 22.3.  He was apparently diagnosed with a urinary tract infection at urgent care. Rapid response called overnight 8/15 due to acute respiratory distress.  Patient was found to be satting in the 60s on room air.  He did improve with nonrebreather although had persistent increased work of breathing.  He was transferred to stepdown unit and placed on BiPAP.  He was given IV Lasix with over 1 L diuresis with improvement in respiration.  He was also started on IV antibiotics due to concern for pneumonia. His respiratory status continued to improve and was weaned off O2.   New events last 24 hours / Subjective: Feeling well today, eating breakfast and no complaints.   Assessment & Plan:   Principal Problem:   Sepsis ([place]) Active Problems:   Acute lower UTI   Tachycardia   Leukocytosis   Acute respiratory failure with hypoxia (HCC)   Paroxysmal supraventricular tachycardia (HCC)  Fall at home without loss of consciousness -PT OT evaluation, recommending SNF, patient initially declined, but now interested in SNF again. SW consulted.   Rhabdomyolysis -Resolved  Sepsis secondary to Klebsiella bacteremia  -I am unable to locate urinalysis result (completed at urgent care).  Per ED physician documentation, patient was diagnosed with UTI at urgent care. Urine culture negative, no  growth -No intra-abdominal source of infection found on CT A/P  -Possibly transient bacteremia from urinary source?  -Continue Rocephin  -Repeat blood culture 8/15 negative to date -No fevers overnight  Acute hypoxemic respiratory failure secondary to CAP  -CXR revealed interstitial and alveolar airspace opacities most compatible with bronchopneumonia. Started on vanco/zosyn --> Now on rocephin/azithromycin  -Required BiPAP overnight 8/15, now on room air    Elevated transaminase -Likely secondary to dehydration -Right upper quadrant ultrasound negative -CT A/P showed diffuse hepatosteatosis  -Acute hepatitis panel negative -LFT trending down, bilirubin trending down   Elevated troponin -Likely secondary to demand ischemia, patient currently denies any chest pain -Troponin peak at 0.66, echocardiogram with no wall motion abnormalities  Aortic stenosis -Need outpatient follow up   Elevated ddimer -Negative CTA for PE   Essential hypertension -Norvasc -BP stable this morning   Hypokalemia -Replace, trend    DVT prophylaxis: Lovenox  Code Status: Full code Family Communication: No family at bedside Disposition Plan: SNF work up, spoke with SW this morning.    Consultants:   None  Procedures:   None   Antimicrobials:  Anti-infectives (From admission, onward)   Start     Dose/Rate Route Frequency Ordered Stop   12/09/17 0600  vancomycin (VANCOCIN) IVPB 1000 mg/200 mL premix  Status:  Discontinued     1,000 mg 200 mL/hr over 60 Minutes Intravenous Every 24 hours 12/08/17 0652 12/08/17 1417   12/08/17 2200  cefTRIAXone (ROCEPHIN) 2 g in sodium chloride 0.9 %  100 mL IVPB     2 g 200 mL/hr over 30 Minutes Intravenous Every 24 hours 12/08/17 1417     12/08/17 1430  azithromycin (ZITHROMAX) 500 mg in sodium chloride 0.9 % 250 mL IVPB     500 mg 250 mL/hr over 60 Minutes Intravenous Every 24 hours 12/08/17 1417     12/08/17 0700  piperacillin-tazobactam (ZOSYN) IVPB  3.375 g  Status:  Discontinued     3.375 g 12.5 mL/hr over 240 Minutes Intravenous Every 8 hours 12/08/17 0650 12/08/17 1417   12/08/17 0700  vancomycin (VANCOCIN) IVPB 1000 mg/200 mL premix     1,000 mg 200 mL/hr over 60 Minutes Intravenous  Once 12/08/17 0651 12/08/17 0808   12/07/17 2200  cefTRIAXone (ROCEPHIN) 1 g in sodium chloride 0.9 % 100 mL IVPB  Status:  Discontinued     1 g 200 mL/hr over 30 Minutes Intravenous Every 24 hours 12/07/17 0131 12/07/17 1449   12/07/17 1500  cefTRIAXone (ROCEPHIN) 2 g in sodium chloride 0.9 % 100 mL IVPB  Status:  Discontinued     2 g 200 mL/hr over 30 Minutes Intravenous Daily 12/07/17 1449 12/08/17 0654   12/06/17 2145  cefTRIAXone (ROCEPHIN) 1 g in sodium chloride 0.9 % 100 mL IVPB     1 g 200 mL/hr over 30 Minutes Intravenous  Once 12/06/17 2134 12/06/17 2234       Objective: Vitals:   12/11/17 1351 12/11/17 2150 12/12/17 0622 12/12/17 0912  BP: 133/64 124/65 (!) 117/46 123/64  Pulse: 84 97 95 96  Resp: 15 16 15    Temp: 98.8 F (37.1 C) 98.8 F (37.1 C) 98.7 F (37.1 C)   TempSrc: Oral Oral Oral   SpO2: 98% 97% 95%   Weight:      Height:        Intake/Output Summary (Last 24 hours) at 12/12/2017 0939 Last data filed at 12/12/2017 0839 Gross per 24 hour  Intake 933.45 ml  Output 1275 ml  Net -341.55 ml   Filed Weights   12/08/17 0500 12/10/17 0500 12/11/17 0501  Weight: 65.8 kg 63 kg 65.4 kg    Examination: General exam: Appears calm and comfortable  Respiratory system: Clear to auscultation. Respiratory effort normal. On room air without distress  Cardiovascular system: S1 & S2 heard, RRR. +systolic murmur  Gastrointestinal system: Abdomen is nondistended, soft and nontender. No organomegaly or masses felt. Normal bowel sounds heard. Central nervous system: Alert and oriented. No focal neurological deficits. Extremities: Symmetric 5 x 5 power. Skin: No rashes, lesions or ulcers Psychiatry: Judgement and insight appear  normal. Mood & affect appropriate.     Data Reviewed: I have personally reviewed following labs and imaging studies  CBC: Recent Labs  Lab 12/06/17 2207  12/08/17 0548 12/09/17 0324 12/10/17 0322 12/11/17 0445 12/12/17 0447  WBC 22.3*   < > 15.9* 11.2* 12.1* 11.1* 10.1  NEUTROABS 21.1*  --   --   --   --   --   --   HGB 14.8   < > 15.4 13.3 12.8* 14.0 13.0  HCT 40.8   < > 43.0 37.9* 35.8* 38.7* 36.9*  MCV 85.5   < > 85.3 85.6 85.4 84.5 84.4  PLT 287   < > 253 211 208 248 320   < > = values in this interval not displayed.   Basic Metabolic Panel: Recent Labs  Lab 12/08/17 0548 12/09/17 0324 12/10/17 0322 12/11/17 0445 12/12/17 0447  NA 136 136 132* 133*  136  K 3.3* 3.3* 3.2* 3.5 3.3*  CL 100 101 99 100 102  CO2 21* 27 24 24 26   GLUCOSE 177* 124* 116* 99 108*  BUN 16 12 13 14 17   CREATININE 0.76 0.63 0.58* 0.50* 0.55*  CALCIUM 8.4* 8.1* 8.1* 8.5* 8.6*  MG  --   --  2.0 2.3 2.2   GFR: Estimated Creatinine Clearance: 53.1 mL/min (A) (by C-G formula based on SCr of 0.55 mg/dL (L)). Liver Function Tests: Recent Labs  Lab 12/08/17 0548 12/09/17 0324 12/10/17 0322 12/11/17 0445 12/12/17 0447  AST 140* 75* 58* 54* 49*  ALT 319* 195* 146* 119* 103*  ALKPHOS 120 94 113 140* 156*  BILITOT 5.6* 6.3* 6.0* 3.4* 2.1*  PROT 6.7 5.8* 5.8* 6.1* 6.3*  ALBUMIN 3.7 2.9* 2.9* 2.9* 2.9*   No results for input(s): LIPASE, AMYLASE in the last 168 hours. No results for input(s): AMMONIA in the last 168 hours. Coagulation Profile: Recent Labs  Lab 12/07/17 0557  INR 1.80   Cardiac Enzymes: Recent Labs  Lab 12/06/17 2207 12/06/17 2333 12/07/17 0557 12/07/17 1241 12/08/17 0548  CKTOTAL 1,430*  --  1,505*  --  732*  CKMB  --   --  9.5*  --   --   TROPONINI  --  0.58* 0.66* 0.53*  --    BNP (last 3 results) No results for input(s): PROBNP in the last 8760 hours. HbA1C: No results for input(s): HGBA1C in the last 72 hours. CBG: No results for input(s): GLUCAP in the  last 168 hours. Lipid Profile: No results for input(s): CHOL, HDL, LDLCALC, TRIG, CHOLHDL, LDLDIRECT in the last 72 hours. Thyroid Function Tests: No results for input(s): TSH, T4TOTAL, FREET4, T3FREE, THYROIDAB in the last 72 hours. Anemia Panel: No results for input(s): VITAMINB12, FOLATE, FERRITIN, TIBC, IRON, RETICCTPCT in the last 72 hours. Sepsis Labs: Recent Labs  Lab 12/06/17 2216 12/07/17 0015  LATICACIDVEN 3.48* 4.09*    Recent Results (from the past 240 hour(s))  Blood culture (routine x 2)     Status: Abnormal   Collection Time: 12/06/17 10:06 PM  Result Value Ref Range Status   Specimen Description   Final    BLOOD LEFT FOREARM Performed at [facility] [address], [place], [place] [address]    Special Requests   Final    BOTTLES DRAWN AEROBIC AND ANAEROBIC Blood Culture adequate volume Performed at [facility] [address], [place], [place] [address]    Culture  Setup Time GRAM NEGATIVE RODS AEROBIC BOTTLE ONLY   Final   Culture (A)  Final    KLEBSIELLA PNEUMONIAE SUSCEPTIBILITIES PERFORMED ON PREVIOUS CULTURE WITHIN THE LAST 5 DAYS. Performed at [facility], [place] [address], [place], [place] [address]    Report Status 12/09/2017 FINAL  Final  Blood culture (routine x 2)     Status: Abnormal   Collection Time: 12/06/17 10:06 PM  Result Value Ref Range Status   Specimen Description   Final    BLOOD LEFT WRIST Performed at [facility], [place] [address] [place], [place] [address]    Special Requests   Final    BOTTLES DRAWN AEROBIC AND ANAEROBIC Blood Culture results may not be optimal due to an inadequate volume of blood received in culture bottles Performed at [facility] [address] [place], [place] [address]    Culture  Setup Time   Final    GRAM NEGATIVE RODS IN  BOTH AEROBIC AND ANAEROBIC BOTTLES CRITICAL RESULT CALLED TO, READ BACK BY AND VERIFIED WITH: Missy Sabins  [facility] [phone] FCP    Culture KLEBSIELLA PNEUMONIAE (A)  Final   Report Status 12/09/2017 FINAL  Final   Organism ID, Bacteria KLEBSIELLA PNEUMONIAE  Final      Susceptibility   Klebsiella pneumoniae - MIC*    AMPICILLIN >=32 RESISTANT Resistant     CEFAZOLIN <=4 SENSITIVE Sensitive     CEFEPIME <=1 SENSITIVE Sensitive     CEFTAZIDIME <=1 SENSITIVE Sensitive     CEFTRIAXONE <=1 SENSITIVE Sensitive     CIPROFLOXACIN <=0.25 SENSITIVE Sensitive     GENTAMICIN <=1 SENSITIVE Sensitive     IMIPENEM 0.5 SENSITIVE Sensitive     TRIMETH/SULFA <=20 SENSITIVE Sensitive     AMPICILLIN/SULBACTAM 8 SENSITIVE Sensitive     PIP/TAZO <=4 SENSITIVE Sensitive     Extended ESBL NEGATIVE Sensitive     * KLEBSIELLA PNEUMONIAE  Blood Culture ID Panel (Reflexed)     Status: Abnormal   Collection Time: 12/06/17 10:06 PM  Result Value Ref Range Status   Enterococcus species NOT DETECTED NOT DETECTED Final   Listeria monocytogenes NOT DETECTED NOT DETECTED Final   Staphylococcus species NOT DETECTED NOT DETECTED Final   Staphylococcus aureus NOT DETECTED NOT DETECTED Final   Streptococcus species NOT DETECTED NOT DETECTED Final   Streptococcus agalactiae NOT DETECTED NOT DETECTED Final   Streptococcus pneumoniae NOT DETECTED NOT DETECTED Final   Streptococcus pyogenes NOT DETECTED NOT DETECTED Final   Acinetobacter baumannii NOT DETECTED NOT DETECTED Final   Enterobacteriaceae species DETECTED (A) NOT DETECTED Final    Comment: Enterobacteriaceae represent a large family of gram-negative bacteria, not a single organism. CRITICAL RESULT CALLED TO, READ BACK BY AND VERIFIED WITH: PHARMD D WOFFORD [phone] FCP    Enterobacter cloacae complex NOT DETECTED NOT DETECTED Final   Escherichia coli NOT DETECTED NOT DETECTED Final   Klebsiella oxytoca NOT DETECTED NOT DETECTED Final   Klebsiella pneumoniae DETECTED (A) NOT DETECTED Final    Comment: CRITICAL RESULT CALLED TO, READ BACK BY AND VERIFIED  WITH: PHARMD D WOFFORD [phone] FCP    Proteus species NOT DETECTED NOT DETECTED Final   Serratia marcescens NOT DETECTED NOT DETECTED Final   Carbapenem resistance NOT DETECTED NOT DETECTED Final   Haemophilus influenzae NOT DETECTED NOT DETECTED Final   Neisseria meningitidis NOT DETECTED NOT DETECTED Final   Pseudomonas aeruginosa NOT DETECTED NOT DETECTED Final   Candida albicans NOT DETECTED NOT DETECTED Final   Candida glabrata NOT DETECTED NOT DETECTED Final   Candida krusei NOT DETECTED NOT DETECTED Final   Candida parapsilosis NOT DETECTED NOT DETECTED Final   Candida tropicalis NOT DETECTED NOT DETECTED Final  Urine culture     Status: None   Collection Time: 12/06/17 11:33 PM  Result Value Ref Range Status   Specimen Description   Final    URINE, RANDOM Performed at [facility], [place] [address] [place], [place] [address]    Special Requests   Final    NONE Performed at [facility], [place] [address] [place], [place] [address]    Culture   Final    NO GROWTH Performed at [facility], [place] [address], [place], [place] [address]    Report Status 12/08/2017 FINAL  Final  MRSA PCR Screening     Status: None   Collection Time: 12/08/17  5:27 AM  Result Value Ref Range Status  MRSA by PCR NEGATIVE NEGATIVE Final    Comment:        The GeneXpert MRSA Assay (FDA approved for NASAL specimens only), is one component of a comprehensive MRSA colonization surveillance program. It is not intended to diagnose MRSA infection nor to guide or monitor treatment for MRSA infections. Performed at [facility], [place] [address], [place], [place] [address]   Culture, blood (routine x 2)     Status: None (Preliminary result)   Collection Time: 12/08/17 12:24 PM  Result Value Ref Range Status   Specimen Description   Final    BLOOD RIGHT ANTECUBITAL Performed at [facility] [address]  [place], [place] [address]    Special Requests   Final    BOTTLES DRAWN AEROBIC AND ANAEROBIC Blood Culture adequate volume Performed at [facility] [address] [place], [place] [address]    Culture   Final    NO GROWTH 3 DAYS Performed at [facility], [place] [address] [place], [place] [address]    Report Status PENDING  Incomplete  Culture, blood (routine x 2)     Status: None (Preliminary result)   Collection Time: 12/08/17 12:25 PM  Result Value Ref Range Status   Specimen Description   Final    BLOOD BLOOD RIGHT ARM Performed at [facility] [address] [place], [place] [address]    Special Requests   Final    BOTTLES DRAWN AEROBIC ONLY Blood Culture adequate volume Performed at [facility] [address] [place], [place] [address]    Culture   Final    NO GROWTH 3 DAYS Performed at [facility], [place] [address], [place], [place] [address]    Report Status PENDING  Incomplete       Radiology Studies: No results found.    Scheduled Meds: . amLODipine  5 mg Oral Daily  . chlorhexidine  15 mL Mouth Rinse BID  . cholecalciferol  2,000 Units Oral Daily  . enoxaparin (LOVENOX) injection  40 mg Subcutaneous QHS  . latanoprost  1 drop Both Eyes QHS  . magnesium oxide  200 mg Oral Daily  . mouth rinse  15 mL Mouth Rinse q12n4p  . multivitamin with minerals  1 tablet Oral Daily  . niacin  100 mg Oral Q1200  . ondansetron (ZOFRAN) IV  4 mg Intravenous Once   Continuous Infusions: . sodium chloride Stopped (12/11/17 1715)  . azithromycin Stopped (12/11/17 1654)  . cefTRIAXone (ROCEPHIN)  IV Stopped (12/11/17 2227)     LOS: 6 days    Time spent: 20 minutes   Dessa Phi, DO [facility] [email or web address] Password [facility] 12/12/2017, 9:39 AM

## 2017-12-12 NOTE — NC FL2 (Signed)
[place] LEVEL OF CARE SCREENING TOOL     IDENTIFICATION  Patient Name: Jeffrey Porter Birthdate: Sep 01, 1931 Sex: male Admission Date (Current Location): 12/06/2017  [facility] and [place] Number:  Herbalist and Address:  [facility],  [place], [place]      Provider Number: [id number]  Attending Physician Name and Address:  Dessa Phi, DO  Relative Name and Phone Number:       Current Level of Care: Hospital Recommended Level of Care: [place] Prior Approval Number:    Date Approved/Denied:   PASRR Number:    Discharge Plan: Home    Current Diagnoses: Patient Active Problem List   Diagnosis Date Noted  . Acute respiratory failure with hypoxia ([place])   . Paroxysmal supraventricular tachycardia ([place])   . Sepsis ([place]) 12/06/2017  . Acute lower UTI 12/06/2017  . Tachycardia 12/06/2017  . Leukocytosis 12/06/2017    Orientation RESPIRATION BLADDER Height & Weight     Self, Time, Situation, Place  Normal Continent Weight: 144 lb 1.6 oz (65.4 kg) Height:  5\' 2"  (157.5 cm)  BEHAVIORAL SYMPTOMS/MOOD NEUROLOGICAL BOWEL NUTRITION STATUS      Continent Diet(Heart Healthy )  AMBULATORY STATUS COMMUNICATION OF NEEDS Skin   Extensive Assist   Normal                       Personal Care Assistance Level of Assistance  Bathing, Feeding, Dressing Bathing Assistance: Limited assistance Feeding assistance: Independent Dressing Assistance: Limited assistance     Functional Limitations Info  Sight, Hearing, Speech Sight Info: Impaired(Wears Glasses. ) Hearing Info: Adequate Speech Info: Adequate    SPECIAL CARE FACTORS FREQUENCY  PT (By licensed PT), OT (By licensed OT)     PT Frequency: 5x/week OT Frequency: 5x/week            Contractures Contractures Info: Not present    Additional Factors Info  Code Status, Allergies, Psychotropic Code Status Info: Fullcode Allergies Info: Timolol  Maleate           Current Medications (12/12/2017):  This is the current hospital active medication list Current Facility-Administered Medications  Medication Dose Route Frequency Provider Last Rate Last Dose  . 0.9 %  sodium chloride infusion   Intravenous PRN Dessa Phi, DO   Stopped at 12/11/17 1715  . acetaminophen (TYLENOL) tablet 650 mg  650 mg Oral Q6H PRN Schorr, Rhetta Mura, NP   650 mg at 12/10/17 0008   Or  . acetaminophen (TYLENOL) suppository 650 mg  650 mg Rectal Q6H PRN Schorr, Rhetta Mura, NP      . alum & mag hydroxide-simeth (MAALOX/MYLANTA) 200-200-20 MG/5ML suspension 15 mL  15 mL Oral Q6H PRN Schorr, Rhetta Mura, NP   15 mL at 12/07/17 2338  . amLODipine (NORVASC) tablet 5 mg  5 mg Oral Daily Dessa Phi, DO   5 mg at 12/12/17 0906  . azithromycin (ZITHROMAX) 500 mg in sodium chloride 0.9 % 250 mL IVPB  500 mg Intravenous Q24H Dessa Phi, DO   Stopped at 12/11/17 1654  . cefTRIAXone (ROCEPHIN) 2 g in sodium chloride 0.9 % 100 mL IVPB  2 g Intravenous Q24H Dessa Phi, DO   Stopped at 12/11/17 2227  . chlorhexidine (PERIDEX) 0.12 % solution 15 mL  15 mL Mouth Rinse BID Dessa Phi, DO   15 mL at 12/12/17 0907  . cholecalciferol (VITAMIN D) tablet 2,000 Units  2,000 Units Oral Daily Schorr,  Rhetta Mura, NP   2,000 Units at 12/12/17 0905  . enoxaparin (LOVENOX) injection 40 mg  40 mg Subcutaneous QHS Schorr, Rhetta Mura, NP   40 mg at 12/11/17 2149  . hydrALAZINE (APRESOLINE) injection 5 mg  5 mg Intravenous Q4H PRN Dessa Phi, DO   5 mg at 12/10/17 1121  . latanoprost (XALATAN) 0.005 % ophthalmic solution 1 drop  1 drop Both Eyes QHS Schorr, Rhetta Mura, NP   1 drop at 12/11/17 2150  . magnesium oxide (MAG-OX) tablet 200 mg  200 mg Oral Daily Schorr, Rhetta Mura, NP   200 mg at 12/12/17 0906  . MEDLINE mouth rinse  15 mL Mouth Rinse q12n4p Dessa Phi, DO   15 mL at 12/11/17 1132  . meloxicam (MOBIC) tablet 7.5 mg  7.5 mg Oral Daily PRN Schorr,  Rhetta Mura, NP      . multivitamin with minerals tablet 1 tablet  1 tablet Oral Daily Schorr, Rhetta Mura, NP   1 tablet at 12/12/17 0906  . niacin tablet 100 mg  100 mg Oral Q1200 Dessa Phi, DO   100 mg at 12/11/17 1137  . ondansetron (ZOFRAN) injection 4 mg  4 mg Intravenous Once Schorr, Rhetta Mura, NP      . ondansetron (ZOFRAN) injection 4 mg  4 mg Intravenous Q6H PRN Schorr, Rhetta Mura, NP   4 mg at 12/07/17 2048     Discharge Medications: Please see discharge summary for a list of discharge medications.  Relevant Imaging Results:  Relevant Lab Results:   Additional Information ssn:   Lia Hopping, LCSW

## 2017-12-12 NOTE — Progress Notes (Addendum)
CSW met with patient and talk with daughter via phone to discuss rehab placement. Patient and daughter agree the patient could use therapy to regain strength before returning home. CSW explain SNF process and will later follow up with bed offers.  CSW followed up with bed offers. Patient and family chose [facility]. [place] has initiated Ship broker.  CSW will inform medical staff when received.   Kathrin Greathouse, Marlinda Mike, MSW Clinical Social Worker  [phone] 12/12/2017  10:32 AM

## 2017-12-12 NOTE — Progress Notes (Signed)
Physical Therapy Treatment Patient Details Name: Jeffrey Porter MRN: [id number] DOB: February 15, 1932 Today's Date: 12/12/2017    History of Present Illness Jeffrey Porter is a 84 yo male with past medical history significant for hyperlipidemia, cataract who presents after fall at home, on floor overnight. Admitted with UTI,  rapid response called 8/15 AM for SOB and  incr. HR    PT Comments    Assisted out od recliner to amb required increased, increased time and 100% VC's to "step big" as pt tends to festinate.  Pt will need ST Rehab at SNF  Follow Up Recommendations  SNF     Equipment Recommendations  None recommended by PT    Recommendations for Other Services       Precautions / Restrictions Precautions Precautions: Fall Restrictions Weight Bearing Restrictions: No    Mobility  Bed Mobility               General bed mobility comments: OOB in recliner  Transfers Overall transfer level: Needs assistance Equipment used: Rolling walker (2 wheeled) Transfers: Sit to/from [facility] Sit to Stand: Min assist;Mod assist Stand pivot transfers: Min assist;Mod assist       General transfer comment: 25% VC's on proper hand placement and extra assist to complete turns/pivot due to increased shuffle  Ambulation/Gait Ambulation/Gait assistance: Mod assist Gait Distance (Feet): 18 Feet Assistive device: Rolling walker (2 wheeled) Gait Pattern/deviations: Step-through pattern;Festinating;Step-to pattern;Decreased stride length;Decreased weight shift to right;Decreased weight shift to left Gait velocity: decreased x 2   General Gait Details: patient demonstates festinating gait. decreased abilty to take full step length esp through doorways and sharpe/tight turns     Marine scientist [place] (Stroke Patients Only)       Balance                                            Cognition  Arousal/Alertness: Awake/alert Behavior During Therapy: WFL for tasks assessed/performed;Flat affect Overall Cognitive Status: Within Functional Limits for tasks assessed                                 General Comments: pleasant      Exercises      General Comments        Pertinent Vitals/Pain Pain Assessment: No/denies pain Pain Location: stiffness all over  Pain Descriptors / Indicators: Tightness Pain Intervention(s): Monitored during session    Home Living                      Prior Function            PT Goals (current goals can now be found in the care plan section) Progress towards PT goals: Progressing toward goals    Frequency    Min 2X/week      PT Plan Frequency needs to be updated    Co-evaluation              AM-PAC PT "6 Clicks" Daily Activity  Outcome Measure  Difficulty turning over in bed (including adjusting bedclothes, sheets and blankets)?: A Lot Difficulty moving from lying on back to sitting on the side of the bed? : A Lot Difficulty sitting down on and standing  up from a chair with arms (e.g., wheelchair, bedside commode, etc,.)?: A Lot Help needed moving to and from a bed to chair (including a wheelchair)?: A Lot Help needed walking in hospital room?: A Lot Help needed climbing 3-5 steps with a railing? : Total 6 Click Score: 11    End of Session Equipment Utilized During Treatment: Gait belt Activity Tolerance: Patient tolerated treatment well Patient left: in chair;with call bell/phone within reach;with chair alarm set Nurse Communication: Mobility status PT Visit Diagnosis: Unsteadiness on feet (R26.81);History of falling (Z91.81)     Time: 1435-1500 PT Time Calculation (min) (ACUTE ONLY): 25 min  Charges:  $Gait Training: 8-22 mins $Therapeutic Activity: 8-22 mins                     Rica Koyanagi  PTA [facility]  Acute  Rehab Pager      [phone]

## 2017-12-12 NOTE — Care Management Important Message (Signed)
Important Message  Patient Details  Name: Jeffrey Porter MRN: [id number] Date of Birth: October 29, 1931   Medicare Important Message Given:  Yes    Kerin Salen 12/12/2017, 11:54 AMImportant Message  Patient Details  Name: Jeffrey Porter MRN: [id number] Date of Birth: May 25, 1931   Medicare Important Message Given:  Yes    Kerin Salen 12/12/2017, 11:54 AM

## 2017-12-13 LAB — CBC
HEMATOCRIT: 34.9 % — AB (ref 39.0–52.0)
HEMOGLOBIN: 12.4 g/dL — AB (ref 13.0–17.0)
MCH: 30.3 pg (ref 26.0–34.0)
MCHC: 35.5 g/dL (ref 30.0–36.0)
MCV: 85.3 fL (ref 78.0–100.0)
PLATELETS: 362 10*3/uL (ref 150–400)
RBC: 4.09 MIL/uL — AB (ref 4.22–5.81)
RDW: 13.7 % (ref 11.5–15.5)
WBC: 8.6 10*3/uL (ref 4.0–10.5)

## 2017-12-13 LAB — BASIC METABOLIC PANEL
ANION GAP: 9 (ref 5–15)
BUN: 14 mg/dL (ref 8–23)
CALCIUM: 8.7 mg/dL — AB (ref 8.9–10.3)
CO2: 28 mmol/L (ref 22–32)
CREATININE: 0.62 mg/dL (ref 0.61–1.24)
Chloride: 102 mmol/L (ref 98–111)
GFR calc Af Amer: >60 mL/min (ref >60)
GFR calc non Af Amer: >60 mL/min (ref >60)
Glucose, Bld: 114 mg/dL — ABNORMAL HIGH (ref 70–99)
POTASSIUM: 3.4 mmol/L — AB (ref 3.5–5.1)
Sodium: 139 mmol/L (ref 135–145)

## 2017-12-13 LAB — CULTURE, BLOOD (ROUTINE X 2)
Culture: NO GROWTH
Culture: NO GROWTH
Special Requests: ADEQUATE
Special Requests: ADEQUATE

## 2017-12-13 LAB — HEPATIC FUNCTION PANEL
ALBUMIN: 3 g/dL — AB (ref 3.5–5.0)
ALT: 207 U/L — ABNORMAL HIGH (ref 0–44)
AST: 193 U/L — AB (ref 15–41)
Alkaline Phosphatase: 289 U/L — ABNORMAL HIGH (ref 38–126)
Bilirubin, Direct: 2.1 mg/dL — ABNORMAL HIGH (ref 0.0–0.2)
Indirect Bilirubin: 1.1 mg/dL — ABNORMAL HIGH (ref 0.3–0.9)
Total Bilirubin: 3.2 mg/dL — ABNORMAL HIGH (ref 0.3–1.2)
Total Protein: 6.2 g/dL — ABNORMAL LOW (ref 6.5–8.1)

## 2017-12-13 MED ORDER — POLYETHYLENE GLYCOL 3350 17 G PO PACK
17.0000 g | PACK | Freq: Every day | ORAL | Status: DC
  Administered 2017-12-15: 17 g via ORAL
  Filled 2017-12-13 (×2): qty 1

## 2017-12-13 MED ORDER — POTASSIUM CHLORIDE CRYS ER 20 MEQ PO TBCR
40.0000 meq | EXTENDED_RELEASE_TABLET | Freq: Once | ORAL | Status: AC
  Administered 2017-12-13: 40 meq via ORAL
  Filled 2017-12-13: qty 2

## 2017-12-13 NOTE — Progress Notes (Signed)
PROGRESS NOTE    Lena Fieldhouse  PPI:[id number] DOB: 10-22-1931 DOA: 12/06/2017 PCP: [name], MD     Brief Narrative:  Jeffrey Porter is a 84 yo male with past medical history significant for hyperlipidemia, cataract who presents after fall at home.  He states that he went to the restroom to vomit, then while getting up, he lost his balance and fell.  He denies any loss of consciousness.  He states that it took him about 12 hours to finally be able to get up.  He lives alone.  He apparently went to urgent care, was sent then to the emergency department.  In the emergency department, his CK was 1430, lactic acid 3.48, WBC 22.3.  He was apparently diagnosed with a urinary tract infection at urgent care. Rapid response called overnight 8/15 due to acute respiratory distress.  Patient was found to be satting in the 60s on room air.  He did improve with nonrebreather although had persistent increased work of breathing.  He was transferred to stepdown unit and placed on BiPAP.  He was given IV Lasix with over 1 L diuresis with improvement in respiration.  He was also started on IV antibiotics due to concern for pneumonia. His respiratory status continued to improve and was weaned off O2.   New events last 24 hours / Subjective: Had some right sided back pain this morning, but after using the restroom and returning to his bed, he feels better. No complaints of chest pain, SOB, abdominal pain today.   Assessment & Plan:   Principal Problem:   Sepsis ([place]) Active Problems:   Acute lower UTI   Tachycardia   Leukocytosis   Acute respiratory failure with hypoxia (HCC)   Paroxysmal supraventricular tachycardia (HCC)  Fall at home without loss of consciousness -PT OT evaluation, recommending SNF   Rhabdomyolysis -Resolved  Sepsis secondary to Klebsiella bacteremia  -I am unable to locate urinalysis result (completed at urgent care).  Per ED physician documentation, patient was diagnosed with UTI at  urgent care. Urine culture negative, no growth -No intra-abdominal source of infection found on CT A/P  -Possibly transient bacteremia from urinary source?  -Repeat blood culture 8/15 negative to date -Continue Rocephin last day 8/20   Acute hypoxemic respiratory failure secondary to CAP  -CXR revealed interstitial and alveolar airspace opacities most compatible with bronchopneumonia. Started on vanco/zosyn --> Now on rocephin/azithromycin last day 8/20 -Required BiPAP overnight 8/15, now on room air    Elevated transaminase -Likely secondary to dehydration -Right upper quadrant ultrasound negative -CT A/P showed diffuse hepatosteatosis  -Acute hepatitis panel negative -Trend LFTs. Tylenol stopped   Elevated troponin -Likely secondary to demand ischemia, patient currently denies any chest pain -Troponin peak at 0.66, echocardiogram with no wall motion abnormalities  Aortic stenosis -Need outpatient follow up   Elevated ddimer -Negative CTA for PE   Essential hypertension -Norvasc  Hypokalemia -Replace, trend    DVT prophylaxis: Lovenox  Code Status: Full code Family Communication: No family at bedside Disposition Plan: SNF when insurance auth complete    Consultants:   None  Procedures:   None   Antimicrobials:  Anti-infectives (From admission, onward)   Start     Dose/Rate Route Frequency Ordered Stop   12/09/17 0600  vancomycin (VANCOCIN) IVPB 1000 mg/200 mL premix  Status:  Discontinued     1,000 mg 200 mL/hr over 60 Minutes Intravenous Every 24 hours 12/08/17 0652 12/08/17 1417   12/08/17 2200  cefTRIAXone (ROCEPHIN) 2 g in  sodium chloride 0.9 % 100 mL IVPB     2 g 200 mL/hr over 30 Minutes Intravenous Every 24 hours 12/08/17 1417 12/15/17 2159   12/08/17 1430  azithromycin (ZITHROMAX) 500 mg in sodium chloride 0.9 % 250 mL IVPB     500 mg 250 mL/hr over 60 Minutes Intravenous Every 24 hours 12/08/17 1417 12/15/17 1429   12/08/17 0700   piperacillin-tazobactam (ZOSYN) IVPB 3.375 g  Status:  Discontinued     3.375 g 12.5 mL/hr over 240 Minutes Intravenous Every 8 hours 12/08/17 0650 12/08/17 1417   12/08/17 0700  vancomycin (VANCOCIN) IVPB 1000 mg/200 mL premix     1,000 mg 200 mL/hr over 60 Minutes Intravenous  Once 12/08/17 0651 12/08/17 0808   12/07/17 2200  cefTRIAXone (ROCEPHIN) 1 g in sodium chloride 0.9 % 100 mL IVPB  Status:  Discontinued     1 g 200 mL/hr over 30 Minutes Intravenous Every 24 hours 12/07/17 0131 12/07/17 1449   12/07/17 1500  cefTRIAXone (ROCEPHIN) 2 g in sodium chloride 0.9 % 100 mL IVPB  Status:  Discontinued     2 g 200 mL/hr over 30 Minutes Intravenous Daily 12/07/17 1449 12/08/17 0654   12/06/17 2145  cefTRIAXone (ROCEPHIN) 1 g in sodium chloride 0.9 % 100 mL IVPB     1 g 200 mL/hr over 30 Minutes Intravenous  Once 12/06/17 2134 12/06/17 2234       Objective: Vitals:   12/12/17 1500 12/12/17 2038 12/13/17 0611 12/13/17 0615  BP: 116/62 (!) 121/54 (!) 118/50   Pulse: 97 97 83   Resp: 16 17 17    Temp: 99.4 F (37.4 C) 100.1 F (37.8 C) 99.6 F (37.6 C)   TempSrc: Oral Oral Oral   SpO2: 97% 96% 97%   Weight:    64.1 kg  Height:        Intake/Output Summary (Last 24 hours) at 12/13/2017 1202 Last data filed at 12/13/2017 0915 Gross per 24 hour  Intake 953.24 ml  Output 1590 ml  Net -636.76 ml   Filed Weights   12/10/17 0500 12/11/17 0501 12/13/17 0615  Weight: 63 kg 65.4 kg 64.1 kg    Examination: General exam: Appears calm and comfortable  Respiratory system: Clear to auscultation. Respiratory effort normal. Cardiovascular system: S1 & S2 heard, RRR. +systolic murmur  Gastrointestinal system: Abdomen is nondistended, soft and nontender. No organomegaly or masses felt. Normal bowel sounds heard. Central nervous system: Alert and oriented. No focal neurological deficits. Extremities: Symmetric 5 x 5 power. Skin: No rashes, lesions or ulcers Psychiatry: Judgement and  insight appear normal. Mood & affect appropriate.    Data Reviewed: I have personally reviewed following labs and imaging studies  CBC: Recent Labs  Lab 12/06/17 2207  12/09/17 0324 12/10/17 0322 12/11/17 0445 12/12/17 0447 12/13/17 0517  WBC 22.3*   < > 11.2* 12.1* 11.1* 10.1 8.6  NEUTROABS 21.1*  --   --   --   --   --   --   HGB 14.8   < > 13.3 12.8* 14.0 13.0 12.4*  HCT 40.8   < > 37.9* 35.8* 38.7* 36.9* 34.9*  MCV 85.5   < > 85.6 85.4 84.5 84.4 85.3  PLT 287   < > 211 208 248 320 362   < > = values in this interval not displayed.   Basic Metabolic Panel: Recent Labs  Lab 12/09/17 0324 12/10/17 0322 12/11/17 0445 12/12/17 0447 12/13/17 0517  NA 136 132* 133* 136 139  K 3.3* 3.2* 3.5 3.3* 3.4*  CL 101 99 100 102 102  CO2 27 24 24 26 28   GLUCOSE 124* 116* 99 108* 114*  BUN 12 13 14 17 14   CREATININE 0.63 0.58* 0.50* 0.55* 0.62  CALCIUM 8.1* 8.1* 8.5* 8.6* 8.7*  MG  --  2.0 2.3 2.2  --    GFR: Estimated Creatinine Clearance: 53.1 mL/min (by C-G formula based on SCr of 0.62 mg/dL). Liver Function Tests: Recent Labs  Lab 12/09/17 0324 12/10/17 0322 12/11/17 0445 12/12/17 0447 12/13/17 0517  AST 75* 58* 54* 49* 193*  ALT 195* 146* 119* 103* 207*  ALKPHOS 94 113 140* 156* 289*  BILITOT 6.3* 6.0* 3.4* 2.1* 3.2*  PROT 5.8* 5.8* 6.1* 6.3* 6.2*  ALBUMIN 2.9* 2.9* 2.9* 2.9* 3.0*   No results for input(s): LIPASE, AMYLASE in the last 168 hours. No results for input(s): AMMONIA in the last 168 hours. Coagulation Profile: Recent Labs  Lab 12/07/17 0557  INR 1.80   Cardiac Enzymes: Recent Labs  Lab 12/06/17 2207 12/06/17 2333 12/07/17 0557 12/07/17 1241 12/08/17 0548  CKTOTAL 1,430*  --  1,505*  --  732*  CKMB  --   --  9.5*  --   --   TROPONINI  --  0.58* 0.66* 0.53*  --    BNP (last 3 results) No results for input(s): PROBNP in the last 8760 hours. HbA1C: No results for input(s): HGBA1C in the last 72 hours. CBG: No results for input(s): GLUCAP  in the last 168 hours. Lipid Profile: No results for input(s): CHOL, HDL, LDLCALC, TRIG, CHOLHDL, LDLDIRECT in the last 72 hours. Thyroid Function Tests: No results for input(s): TSH, T4TOTAL, FREET4, T3FREE, THYROIDAB in the last 72 hours. Anemia Panel: No results for input(s): VITAMINB12, FOLATE, FERRITIN, TIBC, IRON, RETICCTPCT in the last 72 hours. Sepsis Labs: Recent Labs  Lab 12/06/17 2216 12/07/17 0015  LATICACIDVEN 3.48* 4.09*    Recent Results (from the past 240 hour(s))  Blood culture (routine x 2)     Status: Abnormal   Collection Time: 12/06/17 10:06 PM  Result Value Ref Range Status   Specimen Description   Final    BLOOD LEFT FOREARM Performed at [facility] [address], [place], [place] [address]    Special Requests   Final    BOTTLES DRAWN AEROBIC AND ANAEROBIC Blood Culture adequate volume Performed at [facility] [address] [place], [place] [address]    Culture  Setup Time GRAM NEGATIVE RODS AEROBIC BOTTLE ONLY   Final   Culture (A)  Final    KLEBSIELLA PNEUMONIAE SUSCEPTIBILITIES PERFORMED ON PREVIOUS CULTURE WITHIN THE LAST 5 DAYS. Performed at [facility], [place] [address], [place], [place] [address]    Report Status 12/09/2017 FINAL  Final  Blood culture (routine x 2)     Status: Abnormal   Collection Time: 12/06/17 10:06 PM  Result Value Ref Range Status   Specimen Description   Final    BLOOD LEFT WRIST Performed at [facility], [place] [address] [place], [place] [address]    Special Requests   Final    BOTTLES DRAWN AEROBIC AND ANAEROBIC Blood Culture results may not be optimal due to an inadequate volume of blood received in culture bottles Performed at [facility] [address] [place],  [address]    Culture  Setup Time   Final    GRAM NEGATIVE RODS IN BOTH AEROBIC AND ANAEROBIC  BOTTLES CRITICAL RESULT CALLED TO, READ BACK BY AND VERIFIED WITH: Missy Sabins [facility] [id number] FCP    Culture KLEBSIELLA PNEUMONIAE (A)  Final   Report Status 12/09/2017 FINAL  Final   Organism ID, Bacteria KLEBSIELLA PNEUMONIAE  Final      Susceptibility   Klebsiella pneumoniae - MIC*    AMPICILLIN >=32 RESISTANT Resistant     CEFAZOLIN <=4 SENSITIVE Sensitive     CEFEPIME <=1 SENSITIVE Sensitive     CEFTAZIDIME <=1 SENSITIVE Sensitive     CEFTRIAXONE <=1 SENSITIVE Sensitive     CIPROFLOXACIN <=0.25 SENSITIVE Sensitive     GENTAMICIN <=1 SENSITIVE Sensitive     IMIPENEM 0.5 SENSITIVE Sensitive     TRIMETH/SULFA <=20 SENSITIVE Sensitive     AMPICILLIN/SULBACTAM 8 SENSITIVE Sensitive     PIP/TAZO <=4 SENSITIVE Sensitive     Extended ESBL NEGATIVE Sensitive     * KLEBSIELLA PNEUMONIAE  Blood Culture ID Panel (Reflexed)     Status: Abnormal   Collection Time: 12/06/17 10:06 PM  Result Value Ref Range Status   Enterococcus species NOT DETECTED NOT DETECTED Final   Listeria monocytogenes NOT DETECTED NOT DETECTED Final   Staphylococcus species NOT DETECTED NOT DETECTED Final   Staphylococcus aureus NOT DETECTED NOT DETECTED Final   Streptococcus species NOT DETECTED NOT DETECTED Final   Streptococcus agalactiae NOT DETECTED NOT DETECTED Final   Streptococcus pneumoniae NOT DETECTED NOT DETECTED Final   Streptococcus pyogenes NOT DETECTED NOT DETECTED Final   Acinetobacter baumannii NOT DETECTED NOT DETECTED Final   Enterobacteriaceae species DETECTED (A) NOT DETECTED Final    Comment: Enterobacteriaceae represent a large family of gram-negative bacteria, not a single organism. CRITICAL RESULT CALLED TO, READ BACK BY AND VERIFIED WITH: PHARMD D WOFFORD [phone] FCP    Enterobacter cloacae complex NOT DETECTED NOT DETECTED Final   Escherichia coli NOT DETECTED NOT DETECTED Final   Klebsiella oxytoca NOT DETECTED NOT DETECTED Final   Klebsiella pneumoniae DETECTED (A) NOT DETECTED Final    Comment: CRITICAL RESULT CALLED TO, READ BACK BY AND VERIFIED  WITH: PHARMD D WOFFORD [phone] FCP    Proteus species NOT DETECTED NOT DETECTED Final   Serratia marcescens NOT DETECTED NOT DETECTED Final   Carbapenem resistance NOT DETECTED NOT DETECTED Final   Haemophilus influenzae NOT DETECTED NOT DETECTED Final   Neisseria meningitidis NOT DETECTED NOT DETECTED Final   Pseudomonas aeruginosa NOT DETECTED NOT DETECTED Final   Candida albicans NOT DETECTED NOT DETECTED Final   Candida glabrata NOT DETECTED NOT DETECTED Final   Candida krusei NOT DETECTED NOT DETECTED Final   Candida parapsilosis NOT DETECTED NOT DETECTED Final   Candida tropicalis NOT DETECTED NOT DETECTED Final  Urine culture     Status: None   Collection Time: 12/06/17 11:33 PM  Result Value Ref Range Status   Specimen Description   Final    URINE, RANDOM Performed at [facility], [place] [address], [place], [place] [address]    Special Requests   Final    NONE Performed at [facility], [place] [address], [place], [place] [address]    Culture   Final    NO GROWTH Performed at [facility], [place] [address] [place],  [address]    Report Status 12/08/2017 FINAL  Final  MRSA PCR Screening     Status: None   Collection Time: 12/08/17  5:27 AM  Result Value Ref Range Status   MRSA by PCR NEGATIVE  NEGATIVE Final    Comment:        The GeneXpert MRSA Assay (FDA approved for NASAL specimens only), is one component of a comprehensive MRSA colonization surveillance program. It is not intended to diagnose MRSA infection nor to guide or monitor treatment for MRSA infections. Performed at [facility], [place] [address], [place], [place] [address]   Culture, blood (routine x 2)     Status: None (Preliminary result)   Collection Time: 12/08/17 12:24 PM  Result Value Ref Range Status   Specimen Description   Final    BLOOD RIGHT ANTECUBITAL Performed at [facility] [address]  [place], [place] [address]    Special Requests   Final    BOTTLES DRAWN AEROBIC AND ANAEROBIC Blood Culture adequate volume Performed at [facility] [address], [place], [place] [address]    Culture   Final    NO GROWTH 4 DAYS Performed at [facility], [place] [address], [place], [place] [address]    Report Status PENDING  Incomplete  Culture, blood (routine x 2)     Status: None (Preliminary result)   Collection Time: 12/08/17 12:25 PM  Result Value Ref Range Status   Specimen Description   Final    BLOOD BLOOD RIGHT ARM Performed at [facility] [address], [place], [place] [address]    Special Requests   Final    BOTTLES DRAWN AEROBIC ONLY Blood Culture adequate volume Performed at [facility] [address] [place], [place] [address]    Culture   Final    NO GROWTH 4 DAYS Performed at [facility], [place] [address], [place], [place] [address]    Report Status PENDING  Incomplete       Radiology Studies: No results found.    Scheduled Meds: . amLODipine  5 mg Oral Daily  . chlorhexidine  15 mL Mouth Rinse BID  . cholecalciferol  2,000 Units Oral Daily  . enoxaparin (LOVENOX) injection  40 mg Subcutaneous QHS  . latanoprost  1 drop Both Eyes QHS  . magnesium oxide  200 mg Oral Daily  . mouth rinse  15 mL Mouth Rinse q12n4p  . multivitamin with minerals  1 tablet Oral Daily  . niacin  100 mg Oral Q1200  . ondansetron (ZOFRAN) IV  4 mg Intravenous Once   Continuous Infusions: . sodium chloride Stopped (12/11/17 1715)  . azithromycin 500 mg (12/12/17 1408)  . cefTRIAXone (ROCEPHIN)  IV 2 g (12/12/17 2105)     LOS: 7 days    Time spent: 20 minutes   Dessa Phi, DO [facility] [email or web address] Password [email or web address] 12/13/2017, 12:02 PM

## 2017-12-13 NOTE — Progress Notes (Signed)
[employer] authorization is pending at this time. The patient updated at bedside. Daughter updated by phone.   Kathrin Greathouse, Marlinda Mike, MSW Clinical Social Worker  [phone] 12/13/2017  3:27 PM

## 2017-12-13 NOTE — Consult Note (Signed)
   [facility] CM Inpatient Consult   12/13/2017  Divonte Senger 12-18-1931 [id number]    [facility] Care Management follow up.   Chart reviewed and confirmed with inpatient LCSW that discharge plan has changed to SNF. No identifiable [facility] Care Management needs at this time.    Marthenia Rolling, MSN-Ed, RN,BSN [facility] Liaison [phone]

## 2017-12-13 NOTE — Progress Notes (Signed)
Occupational Therapy Treatment Patient Details Name: Kraig Genis MRN: [id number] DOB: 01-31-1932 Today's Date: 12/13/2017    History of present illness Aysen Shieh is a 84 yo male with past medical history significant for hyperlipidemia, cataract who presents after fall at home, on floor overnight. Admitted with UTI,  rapid response called 8/15 AM for SOB and  incr. HR   OT comments  Pt progressing towards acute OT goals. Focus of session was functional mobility and grooming tasks at sink. Pt was min-mod A for functional transfers, min guard to min A for functional mobility, min guard for grooming tasks at sink. Standing rest breaks incorporated during mobility, seated rest break prior to standing at sink. Noted festinating gait several times during session followed by pt being able to take walk ~4-6' with improved gait stride before returning to festinating pattern; pt feels weakness > coordination causes this. D/c plan remains appropriate.    Follow Up Recommendations  SNF    Equipment Recommendations       Recommendations for Other Services      Precautions / Restrictions Precautions Precautions: Fall Precaution Comments: monitor HR sats Restrictions Weight Bearing Restrictions: No       Mobility Bed Mobility Overal bed mobility: Needs Assistance Bed Mobility: Supine to Sit;Rolling;Sit to Sidelying Rolling: Supervision   Supine to sit: Mod assist;HOB elevated   Sit to sidelying: Min assist General bed mobility comments: assist to power hips over to EOB postion, assist to advance BLE completely back onto bed at end of session.   Transfers Overall transfer level: Needs assistance Equipment used: Rolling walker (2 wheeled) Transfers: Sit to/from Stand Sit to Stand: Min assist;Mod assist         General transfer comment: extra assist to complete turns/pivot due to increased shuffle    Balance Overall balance assessment: Needs assistance;History of  Falls Sitting-balance support: Feet supported;No upper extremity supported Sitting balance-Leahy Scale: Fair     Standing balance support: During functional activity;Bilateral upper extremity supported Standing balance-Leahy Scale: Poor                             ADL either performed or assessed with clinical judgement   ADL Overall ADL's : Needs assistance/impaired     Grooming: Standing;Min guard;Wash/dry hands;Wash/dry face;Oral care Grooming Details (indicate cue type and reason): stood with intermittent support of sink to complete 3 grooming tasks.                              Functional mobility during ADLs: Min guard;Minimal assistance;Rolling walker General ADL Comments: Mostly min guard A to complete household distance functional mobility with extra time and standing rest breaks incorporated. Seated rest break for a few minutes then stood to complete 3 grooming tasks at sink.     Vision       Perception     Praxis      Cognition Arousal/Alertness: Awake/alert Behavior During Therapy: WFL for tasks assessed/performed;Flat affect Overall Cognitive Status: Within Functional Limits for tasks assessed                                 General Comments: pleasant        Exercises     Shoulder Instructions       General Comments      Pertinent Vitals/ Pain  Pain Assessment: Faces Faces Pain Scale: Hurts even more Pain Location: back Pain Descriptors / Indicators: Cramping Pain Intervention(s): Monitored during session;Repositioned  Home Living                                          Prior Functioning/Environment              Frequency  Min 2X/week        Progress Toward Goals  OT Goals(current goals can now be found in the care plan section)  Progress towards OT goals: Progressing toward goals  Acute Rehab OT Goals Patient Stated Goal: to move to house 2 doors from dtr OT Goal  Formulation: With patient Time For Goal Achievement: 12/23/17 Potential to Achieve Goals: Good ADL Goals Pt Will Perform Grooming: with supervision;standing Pt Will Perform Lower Body Bathing: with supervision;sit to/from stand Pt Will Perform Lower Body Dressing: with min assist;sit to/from stand Pt Will Transfer to Toilet: with min guard assist;ambulating;regular height toilet;bedside commode  Plan Discharge plan remains appropriate    Co-evaluation                 AM-PAC PT "6 Clicks" Daily Activity     Outcome Measure   Help from another person eating meals?: None Help from another person taking care of personal grooming?: A Little Help from another person toileting, which includes using toliet, bedpan, or urinal?: A Little Help from another person bathing (including washing, rinsing, drying)?: A Little Help from another person to put on and taking off regular upper body clothing?: A Little Help from another person to put on and taking off regular lower body clothing?: A Lot 6 Click Score: 18    End of Session Equipment Utilized During Treatment: Rolling walker  OT Visit Diagnosis: Muscle weakness (generalized) (M62.81)   Activity Tolerance Patient tolerated treatment well   Patient Left in bed;with call bell/phone within reach;with bed alarm set;with family/visitor present   Nurse Communication Mobility status        Time: 9373-4287 OT Time Calculation (min): 36 min  Charges: OT General Charges $OT Visit: 1 Visit OT Treatments $Self Care/Home Management : 23-37 mins    Hortencia Pilar 12/13/2017, 12:23 PM

## 2017-12-14 DIAGNOSIS — R7881 Bacteremia: Secondary | ICD-10-CM

## 2017-12-14 LAB — BASIC METABOLIC PANEL
ANION GAP: 11 (ref 5–15)
BUN: 12 mg/dL (ref 8–23)
CALCIUM: 8.8 mg/dL — AB (ref 8.9–10.3)
CO2: 25 mmol/L (ref 22–32)
Chloride: 104 mmol/L (ref 98–111)
Creatinine, Ser: 0.48 mg/dL — ABNORMAL LOW (ref 0.61–1.24)
Glucose, Bld: 101 mg/dL — ABNORMAL HIGH (ref 70–99)
POTASSIUM: 3.6 mmol/L (ref 3.5–5.1)
SODIUM: 140 mmol/L (ref 135–145)

## 2017-12-14 LAB — HEPATIC FUNCTION PANEL
ALBUMIN: 3 g/dL — AB (ref 3.5–5.0)
ALK PHOS: 298 U/L — AB (ref 38–126)
ALT: 190 U/L — ABNORMAL HIGH (ref 0–44)
AST: 95 U/L — AB (ref 15–41)
BILIRUBIN TOTAL: 1.6 mg/dL — AB (ref 0.3–1.2)
Bilirubin, Direct: 0.8 mg/dL — ABNORMAL HIGH (ref 0.0–0.2)
Indirect Bilirubin: 0.8 mg/dL (ref 0.3–0.9)
Total Protein: 6.3 g/dL — ABNORMAL LOW (ref 6.5–8.1)

## 2017-12-14 LAB — CBC
HEMATOCRIT: 36.9 % — AB (ref 39.0–52.0)
HEMOGLOBIN: 13 g/dL (ref 13.0–17.0)
MCH: 30.3 pg (ref 26.0–34.0)
MCHC: 35.2 g/dL (ref 30.0–36.0)
MCV: 86 fL (ref 78.0–100.0)
Platelets: 428 10*3/uL — ABNORMAL HIGH (ref 150–400)
RBC: 4.29 MIL/uL (ref 4.22–5.81)
RDW: 13.9 % (ref 11.5–15.5)
WBC: 9.3 10*3/uL (ref 4.0–10.5)

## 2017-12-14 MED ORDER — AMLODIPINE BESYLATE 5 MG PO TABS: 5.0000 mg | ORAL_TABLET | Freq: Every day | ORAL | 0 refills | Status: DC

## 2017-12-14 NOTE — Progress Notes (Signed)
10:00am SNF facility inform CSW [employer] is requesting updated physical therapy notes and vitals. CSW informed appropriate staff.   Kathrin Greathouse, Marlinda Mike, MSW Clinical Social Worker  [phone] 12/14/2017  10:16 AM

## 2017-12-14 NOTE — Discharge Summary (Signed)
Physician Discharge Summary  Jeffrey Porter BMW:[id number] DOB: 20-Apr-1932 DOA: 12/06/2017  PCP: [name], MD  Admit date: 12/06/2017 Discharge date: 12/14/2017  Time spent: 45 minutes  Recommendations for Outpatient Follow-up:  Patient will be discharged to skilled nursing facility. Continue physical and occupational therapy.  Patient will need to follow up with primary care provider within one week of discharge, repeat CMP.  Patient should continue medications as prescribed.  Patient should follow a heart healthy diet.   Discharge Diagnoses:  Sepsis secondary to Klebsiella bacteremia Acute hypoxemic respiratory failure secondary to CAP Rhabdomyolysis Fall at home Elevated transaminases Elevated troponin Aortic stenosis Elevated d-dimer Essential hypertension Hypokalemia Discharge Condition: Stable  Diet recommendation: heart healthy  Filed Weights   12/11/17 0501 12/13/17 0615 12/14/17 0625  Weight: 65.4 kg 64.1 kg 60 kg    History of present illness:  On 12/06/2017 by Dr. Jani Gravel Bralen [place]  is a 84 y.o. male, wh/o prostate cancer apparently felt at home and was down for about 12 hours.  Pt notes that he has had increase in frequency of urination and fever since yesterday and felt weak in general.  Pt was brought to [facility] urgent care and sent to ER for evaluation of ? Rhabdo, and UTI.   Hospital Course:  Sepsis secondary to Klebsiella bacteremia -?  Urinary source.  Pretty documentation, patient was diagnosed with a UTI at urgent care facility. -Urine culture is negative and shows no growth -CT abdomen pelvis showed no intra-abdominal source of infection -Blood Cultures from 12/06/2017 were positive for Klebsiella -Blood cultures from 12/08/2017 shows no growth -Completed antibiotic course during hospitalization with ceftriaxone  Acute hypoxemic respiratory failure secondary to CAP -Chest x-ray showed interstitial and alveolar airspace opacities compatible with  bronchopneumonia -Initially started on vancomycin and Zosyn, transition to azithromycin and ceftriaxone -Completed course of antibiotics during hospitalization -He did require BiPAP on 12/08/2017 however now has been transitioned to room air containing oxygen saturations in the 90s  Rhabdomyolysis -Resolved  Fall at home -Without loss of consciousness -PT OT recommended SNF -Social work consulted  Elevated transaminases -Suspect secondary to dehydration -Right upper quadrant ultrasound unremarkable -CT abdomen pelvis showed diffuse hepatic steatosis -Hepatitis panel negative -LFTs trending downward -Repeat CMP in 1 week  Elevated troponin -Suspect secondary to demand ischemia -Troponin peaked at 0.66 -Patient denied chest pain  -Echocardiogram EF 27-25%, grade 1 diastolic dysfunction.  No regional wall motion abnormalities.  Aortic stenosis -Follow-up with cardiology as an outpatient  Elevated d-dimer -CTA negative for PE  Essential hypertension -Stable, continue amlodipine  Hypokalemia -resolved with replacment -repeat CMP in one week  Procedures: Echocardiogram  Consultations: None  Discharge Exam: Vitals:   12/14/17 1208 12/14/17 1432  BP: 131/73 (!) 120/54  Pulse: 87 88  Resp:  16  Temp: 98.9 F (37.2 C) 99.1 F (37.3 C)  SpO2: 98% 97%    General: Well developed, well nourished, NAD, appears stated age  HEENT: NCAT, mucous membranes moist.  Neck: Supple  Cardiovascular: S1 S2 auscultated, SEM, RRR  Respiratory: Clear to auscultation bilaterally with equal chest rise  Abdomen: Soft, nontender, nondistended, + bowel sounds  Extremities: warm dry without cyanosis clubbing or edema  Neuro: AAOx3, nonfocal  Psych: Normal affect and demeanor with intact judgement and insight, pleasant  Discharge Instructions Discharge Instructions    Discharge instructions   Complete by:  As directed    Patient will be discharged to skilled nursing  facility. Continue physical and occupational therapy.  Patient will need  to follow up with primary care provider within one week of discharge, repeat CMP.  Patient should continue medications as prescribed.  Patient should follow a heart healthy diet.     Allergies as of 12/14/2017      Reactions   Timolol Maleate Other (See Comments)   Syncope      Medication List    TAKE these medications   acetaminophen 500 MG tablet Commonly known as:  TYLENOL Take 500 mg by mouth daily as needed for moderate pain.   amLODipine 5 MG tablet Commonly known as:  NORVASC Take 1 tablet (5 mg total) by mouth daily. Start taking on:  12/15/2017   latanoprost 0.005 % ophthalmic solution Commonly known as:  XALATAN Place 1 drop into both eyes at bedtime.   LUTEIN-ZEAXANTHIN PO Take 1 tablet by mouth daily.   Magnesium 250 MG Tabs Take 250 mg by mouth daily.   meloxicam 7.5 MG tablet Commonly known as:  MOBIC Take 7.5 mg by mouth daily as needed for pain.   multivitamin with minerals Tabs tablet Take 1 tablet by mouth daily.   niacin 100 MG tablet Take 100 mg by mouth at bedtime.   Vitamin D 2000 units tablet Take 2,000 Units by mouth daily.      Allergies  Allergen Reactions  . Timolol Maleate Other (See Comments)    Syncope     Contact information for follow-up providers    [name], MD. Schedule an appointment as soon as possible for a visit in 1 week(s).   Specialty:  Family Medicine Why:  Hospital follow up Contact information: [place] [address] [phone]            Contact information for after-discharge care    Destination    [facility] .   Service:  Skilled Nursing Contact information: [place] [phone]                   The results of significant diagnostics from this hospitalization (including imaging, microbiology, ancillary and laboratory) are listed  below for reference.    Significant Diagnostic Studies: Dg Chest 2 View  Result Date: 12/06/2017 CLINICAL DATA:  [place] this morning. Weakness and fever. Leukocytosis. History of prostate cancer. EXAM: CHEST - 2 VIEW COMPARISON:  None. FINDINGS: Cardiomediastinal silhouette is normal. Calcified aortic arch. No pleural effusions or focal consolidations. Trachea projects midline and there is no pneumothorax. Soft tissue planes and included osseous structures are non-suspicious. Mild degenerative change of the thoracic spine. Asymmetrically prominent RIGHT chest wall soft tissue may be positional, recommend correlation with physical examination. IMPRESSION: 1. No acute cardiopulmonary process. 2.  Aortic Atherosclerosis (ICD10-I70.0). Electronically Signed   By: Elon Alas M.D.   On: 12/06/2017 22:12   Ct Angio Chest Pe W Or Wo Contrast  Result Date: 12/07/2017 CLINICAL DATA:  Fall, down for 12 hours. Fever and back pain. Leukocytosis. History of prostate cancer. EXAM: CT ANGIOGRAPHY CHEST WITH CONTRAST TECHNIQUE: Multidetector CT imaging of the chest was performed using the standard protocol during bolus administration of intravenous contrast. Multiplanar CT image reconstructions and MIPs were obtained to evaluate the vascular anatomy. CONTRAST:  165mL ISOVUE-370 IOPAMIDOL (ISOVUE-370) INJECTION 76% COMPARISON:  Chest radiograph December 06, 2017 FINDINGS: CARDIOVASCULAR: Adequate contrast opacification of the pulmonary artery's. Main pulmonary artery is not enlarged. No pulmonary arterial filling defects to the level of the subsegmental branches. Heart size is normal. Mild coronary artery calcification. No  pericardial effusion. Thoracic aorta is normal course and caliber, moderate calcific atherosclerosis and intimal thickening. MEDIASTINUM/NODES: No lymphadenopathy by CT size criteria. LUNGS/PLEURA: Tracheobronchial tree is patent, no pneumothorax. No pleural effusions, focal consolidations, pulmonary  nodules or masses. Dependent atelectasis. UPPER ABDOMEN: Nonacute. Subcentimeter hypodensity in segment 4, potential cyst. MUSCULOSKELETAL: Nonacute. Mild degenerative change of the thoracic spine. Review of the MIP images confirms the above findings. IMPRESSION: 1. No acute pulmonary embolism nor acute cardiopulmonary process. Aortic Atherosclerosis (ICD10-I70.0). Electronically Signed   By: Elon Alas M.D.   On: 12/07/2017 04:53   Ct Abdomen Pelvis W Contrast  Result Date: 12/08/2017 CLINICAL DATA:  Abdominal pain yesterday. Sepsis. Leukocytosis. EXAM: CT ABDOMEN AND PELVIS WITH CONTRAST TECHNIQUE: Multidetector CT imaging of the abdomen and pelvis was performed using the standard protocol following bolus administration of intravenous contrast. CONTRAST:  145mL OMNIPAQUE IOHEXOL 300 MG/ML SOLN, 44mL ISOVUE-300 IOPAMIDOL (ISOVUE-300) INJECTION 61% COMPARISON:  Abdomen ultrasound and chest CT obtained yesterday. FINDINGS: Lower chest: Interval minimal bilateral pleural fluid and areas of dependent consolidation in both lower lobes and lingula. Hepatobiliary: Diffuse low density of the liver relative to the spleen. Normal appearing gallbladder. Pancreas: Unremarkable. No pancreatic ductal dilatation or surrounding inflammatory changes. Spleen: Normal in size without focal abnormality. Adrenals/Urinary Tract: Foley catheter in the urinary bladder with associated air in the bladder. Normal appearing adrenal glands, a ureters and left kidney. Tiny area of low density in the anterolateral aspect of the peripheral cortex of the mid to lower right kidney. This is too small to characterize. Stomach/Bowel: Small hiatal hernia. Mildly prominent gas-filled sigmoid colon in the left mid upper abdomen. Unremarkable small bowel. No evidence of appendicitis. Vascular/Lymphatic: Atheromatous arterial calcifications without aneurysm. No enlarged lymph nodes. Reproductive: Prostate radiation seed implants. Other: Left  groin surgical clips and left anterior pelvic wall hernia repair mesh. Small amount of free peritoneal fluid in the inferior pelvis. Musculoskeletal: Lumbar and lower thoracic spine degenerative changes. IMPRESSION: 1. Interval minimal bilateral pleural fluid and areas of dependent consolidation in both lower lobes and lingula. This could be due to pneumonia or atelectasis. 2. Small amount of free peritoneal fluid in the inferior pelvis. 3. Diffuse hepatic steatosis. 4. Tiny area of low density in the right kidney laterally, too small to characterize. Electronically Signed   By: Claudie Revering M.D.   On: 12/08/2017 15:55   Dg Chest Port 1 View  Result Date: 12/08/2017 CLINICAL DATA:  Respiratory distress. EXAM: PORTABLE CHEST 1 VIEW COMPARISON:  Chest radiograph December 06, 2017 and chest CT December 07, 2017 FINDINGS: New interstitial and LEFT greater than RIGHT alveolar airspace opacities. No pleural effusion. Cardiac silhouette is normal. Calcified aortic arch. No pneumothorax. Soft tissue planes and included osseous structures are non suspicious. IMPRESSION: New interstitial and alveolar airspace opacities most compatible with bronchopneumonia. Aortic Atherosclerosis (ICD10-I70.0). Electronically Signed   By: Elon Alas M.D.   On: 12/08/2017 05:25   US Abdomen Limited Ruq  Result Date: 12/07/2017 CLINICAL DATA:  Abnormal liver enzymes EXAM: ULTRASOUND ABDOMEN LIMITED RIGHT UPPER QUADRANT COMPARISON:  None. FINDINGS: Gallbladder: No gallstones or wall thickening visualized. There is no pericholecystic fluid. No sonographic Murphy sign noted by sonographer. Common bile duct: Diameter: 6 mm. No intrahepatic or extrahepatic biliary duct dilatation. Liver: No focal lesion identified. Within normal limits in parenchymal echogenicity. Portal vein is patent on color Doppler imaging with normal direction of blood flow towards the liver. IMPRESSION: Study within normal limits. Electronically Signed   By: Lowella Grip  III M.D.   On: 12/07/2017 10:10    Microbiology: Recent Results (from the past 240 hour(s))  Blood culture (routine x 2)     Status: Abnormal   Collection Time: 12/06/17 10:06 PM  Result Value Ref Range Status   Specimen Description   Final    BLOOD LEFT FOREARM Performed at [facility] [address], [place], [place] [address]    Special Requests   Final    BOTTLES DRAWN AEROBIC AND ANAEROBIC Blood Culture adequate volume Performed at [facility] [address], [place], [place] [address]    Culture  Setup Time GRAM NEGATIVE RODS AEROBIC BOTTLE ONLY   Final   Culture (A)  Final    KLEBSIELLA PNEUMONIAE SUSCEPTIBILITIES PERFORMED ON PREVIOUS CULTURE WITHIN THE LAST 5 DAYS. Performed at [facility], [place] [address], [place], [place] [address]    Report Status 12/09/2017 FINAL  Final  Blood culture (routine x 2)     Status: Abnormal   Collection Time: 12/06/17 10:06 PM  Result Value Ref Range Status   Specimen Description   Final    BLOOD LEFT WRIST Performed at [facility], [place] [address], [place], [place] [address]    Special Requests   Final    BOTTLES DRAWN AEROBIC AND ANAEROBIC Blood Culture results may not be optimal due to an inadequate volume of blood received in culture bottles Performed at [facility] [address] [place],  [address]    Culture  Setup Time   Final    GRAM NEGATIVE RODS IN BOTH AEROBIC AND ANAEROBIC BOTTLES CRITICAL RESULT CALLED TO, READ BACK BY AND VERIFIED WITH: Missy Sabins [facility] [phone] FCP    Culture KLEBSIELLA PNEUMONIAE (A)  Final   Report Status 12/09/2017 FINAL  Final   Organism ID, Bacteria KLEBSIELLA PNEUMONIAE  Final      Susceptibility   Klebsiella pneumoniae - MIC*    AMPICILLIN >=32 RESISTANT Resistant     CEFAZOLIN <=4 SENSITIVE Sensitive     CEFEPIME <=1 SENSITIVE Sensitive     CEFTAZIDIME <=1 SENSITIVE Sensitive     CEFTRIAXONE  <=1 SENSITIVE Sensitive     CIPROFLOXACIN <=0.25 SENSITIVE Sensitive     GENTAMICIN <=1 SENSITIVE Sensitive     IMIPENEM 0.5 SENSITIVE Sensitive     TRIMETH/SULFA <=20 SENSITIVE Sensitive     AMPICILLIN/SULBACTAM 8 SENSITIVE Sensitive     PIP/TAZO <=4 SENSITIVE Sensitive     Extended ESBL NEGATIVE Sensitive     * KLEBSIELLA PNEUMONIAE  Blood Culture ID Panel (Reflexed)     Status: Abnormal   Collection Time: 12/06/17 10:06 PM  Result Value Ref Range Status   Enterococcus species NOT DETECTED NOT DETECTED Final   Listeria monocytogenes NOT DETECTED NOT DETECTED Final   Staphylococcus species NOT DETECTED NOT DETECTED Final   Staphylococcus aureus NOT DETECTED NOT DETECTED Final   Streptococcus species NOT DETECTED NOT DETECTED Final   Streptococcus agalactiae NOT DETECTED NOT DETECTED Final   Streptococcus pneumoniae NOT DETECTED NOT DETECTED Final   Streptococcus pyogenes NOT DETECTED NOT DETECTED Final   Acinetobacter baumannii NOT DETECTED NOT DETECTED Final   Enterobacteriaceae species DETECTED (A) NOT DETECTED Final    Comment: Enterobacteriaceae represent a large family of gram-negative bacteria, not a single organism. CRITICAL RESULT CALLED TO, READ BACK BY AND VERIFIED WITH: Missy Sabins [facility] [phone] FCP    Enterobacter cloacae complex NOT DETECTED NOT DETECTED Final   Escherichia coli  NOT DETECTED NOT DETECTED Final   Klebsiella oxytoca NOT DETECTED NOT DETECTED Final   Klebsiella pneumoniae DETECTED (A) NOT DETECTED Final    Comment: CRITICAL RESULT CALLED TO, READ BACK BY AND VERIFIED WITH: PHARMD D WOFFORD [phone] FCP    Proteus species NOT DETECTED NOT DETECTED Final   Serratia marcescens NOT DETECTED NOT DETECTED Final   Carbapenem resistance NOT DETECTED NOT DETECTED Final   Haemophilus influenzae NOT DETECTED NOT DETECTED Final   Neisseria meningitidis NOT DETECTED NOT DETECTED Final   Pseudomonas aeruginosa NOT DETECTED NOT DETECTED Final   Candida  albicans NOT DETECTED NOT DETECTED Final   Candida glabrata NOT DETECTED NOT DETECTED Final   Candida krusei NOT DETECTED NOT DETECTED Final   Candida parapsilosis NOT DETECTED NOT DETECTED Final   Candida tropicalis NOT DETECTED NOT DETECTED Final  Urine culture     Status: None   Collection Time: 12/06/17 11:33 PM  Result Value Ref Range Status   Specimen Description   Final    URINE, RANDOM Performed at [facility] [address], [place], [place] [address]    Special Requests   Final    NONE Performed at [facility], [place] [address], [place], [place] [address]    Culture   Final    NO GROWTH Performed at [facility], [place] [address] [place], [place] [address]    Report Status 12/08/2017 FINAL  Final  MRSA PCR Screening     Status: None   Collection Time: 12/08/17  5:27 AM  Result Value Ref Range Status   MRSA by PCR NEGATIVE NEGATIVE Final    Comment:        The GeneXpert MRSA Assay (FDA approved for NASAL specimens only), is one component of a comprehensive MRSA colonization surveillance program. It is not intended to diagnose MRSA infection nor to guide or monitor treatment for MRSA infections. Performed at [facility], [place] [address], [place], [place] [address]   Culture, blood (routine x 2)     Status: None   Collection Time: 12/08/17 12:24 PM  Result Value Ref Range Status   Specimen Description   Final    BLOOD RIGHT ANTECUBITAL Performed at [facility] [address] [place], [place] [address]    Special Requests   Final    BOTTLES DRAWN AEROBIC AND ANAEROBIC Blood Culture adequate volume Performed at [facility] [address] [place], [place] [address]    Culture   Final    NO GROWTH 5 DAYS Performed at [facility], [place] [address] [place], [place] [address]    Report Status 12/13/2017 FINAL  Final  Culture, blood (routine x 2)      Status: None   Collection Time: 12/08/17 12:25 PM  Result Value Ref Range Status   Specimen Description   Final    BLOOD BLOOD RIGHT ARM Performed at [facility] [address] [place], [place] [address]    Special Requests   Final    BOTTLES DRAWN AEROBIC ONLY Blood Culture adequate volume Performed at [facility] [address] [place], [place] [address]    Culture   Final    NO GROWTH 5 DAYS Performed at [facility], [place] [address] [place],  [address]    Report Status 12/13/2017 FINAL  Final     Labs: Basic Metabolic Panel: Recent Labs  Lab 12/10/17 0322 12/11/17  7106 12/12/17 0447 12/13/17 0517 12/14/17 0532  NA 132* 133* 136 139 140  K 3.2* 3.5 3.3* 3.4* 3.6  CL 99 100 102 102 104  CO2 24 24 26 28 25   GLUCOSE 116* 99 108* 114* 101*  BUN 13 14 17 14 12   CREATININE 0.58* 0.50* 0.55* 0.62 0.48*  CALCIUM 8.1* 8.5* 8.6* 8.7* 8.8*  MG 2.0 2.3 2.2  --   --    Liver Function Tests: Recent Labs  Lab 12/10/17 0322 12/11/17 0445 12/12/17 0447 12/13/17 0517 12/14/17 0532  AST 58* 54* 49* 193* 95*  ALT 146* 119* 103* 207* 190*  ALKPHOS 113 140* 156* 289* 298*  BILITOT 6.0* 3.4* 2.1* 3.2* 1.6*  PROT 5.8* 6.1* 6.3* 6.2* 6.3*  ALBUMIN 2.9* 2.9* 2.9* 3.0* 3.0*   No results for input(s): LIPASE, AMYLASE in the last 168 hours. No results for input(s): AMMONIA in the last 168 hours. CBC: Recent Labs  Lab 12/10/17 0322 12/11/17 0445 12/12/17 0447 12/13/17 0517 12/14/17 0532  WBC 12.1* 11.1* 10.1 8.6 9.3  HGB 12.8* 14.0 13.0 12.4* 13.0  HCT 35.8* 38.7* 36.9* 34.9* 36.9*  MCV 85.4 84.5 84.4 85.3 86.0  PLT 208 248 320 362 428*   Cardiac Enzymes: Recent Labs  Lab 12/08/17 0548  CKTOTAL 732*   BNP: BNP (last 3 results) Recent Labs    12/08/17 0548  BNP 349.7*    ProBNP (last 3 results) No results for input(s): PROBNP in the last 8760 hours.  CBG: No results for input(s): GLUCAP in the last 168  hours.     Signed:  Cristal Ford  [facility] 12/14/2017, 4:17 PM

## 2017-12-14 NOTE — Progress Notes (Signed)
Physical Therapy Treatment Patient Details Name: Jeffrey Porter MRN: [id number] DOB: 03/01/1932 Today's Date: 12/14/2017    History of Present Illness Jeffrey Porter is a 84 yo male with past medical history significant for hyperlipidemia, cataract who presents after fall at home, on floor overnight. Admitted with UTI,  rapid response called 8/15 AM for SOB and  incr. HR    PT Comments    Pt assisted with ambulating in hallway and continues to present with "stuttering" gait initially and with any challenges.  Pt reports this is not new however unable to state length of this gait pattern.  Pt has outpatient PT notes dated from March 2019 with this gait pattern, and pt saw [facility] at that time Star Age, MD).  MD visit note recommended f/u if his symptoms worsened or did not improve.  Currently recommend pt f/u with Neurologist.  Pt also performed LE exercises in standing min/guard for safety.  Pt able to perform standing hip flexion with each LE with reps for one leg at one time however unable to perform reciprocally.  Continue to recommend d/c to SNF.   Follow Up Recommendations  SNF     Equipment Recommendations  None recommended by PT    Recommendations for Other Services       Precautions / Restrictions Precautions Precautions: Fall    Mobility  Bed Mobility Overal bed mobility: Needs Assistance Bed Mobility: Sit to Supine;Supine to Sit     Supine to sit: Min guard Sit to supine: Min guard   General bed mobility comments: min/guard for safety  Transfers Overall transfer level: Needs assistance Equipment used: Rolling walker (2 wheeled) Transfers: Sit to/from Stand Sit to Stand: Min assist         General transfer comment: assist to rise and steady  Ambulation/Gait Ambulation/Gait assistance: Min assist Gait Distance (Feet): 80 Feet Assistive device: Rolling walker (2 wheeled) Gait Pattern/deviations: Step-through pattern;Festinating;Step-to  pattern;Decreased stride length     General Gait Details: pt with difficulty initiating gait, small short "stuttering" steps to start then able to take longer reciprocal steps (pt counts 1, 2, 3 to self assist); pt also presents with these short stuttering steps after stopping and upon approaching turns   Stairs             Wheelchair Mobility    Modified Rankin (Stroke Patients Only)       Balance           Standing balance support: During functional activity;Bilateral upper extremity supported Standing balance-Leahy Scale: Poor                              Cognition Arousal/Alertness: Awake/alert Behavior During Therapy: WFL for tasks assessed/performed Overall Cognitive Status: Within Functional Limits for tasks assessed                                        Exercises General Exercises - Lower Extremity Hip Flexion/Marching: AROM;10 reps;Standing;Both;Other (comment)(with RW for UE support, min/guard for safety with all exercises) Mini-Sqauts: AROM;10 reps;Both;Standing Other Exercises Other Exercises: standing hip extension bilaterally with RW x10 Other Exercises: standing hamstring curl with RW x10 bilaterally    General Comments        Pertinent Vitals/Pain Pain Assessment: No/denies pain    Home Living  Prior Function            PT Goals (current goals can now be found in the care plan section) Progress towards PT goals: Progressing toward goals    Frequency    Min 2X/week      PT Plan Current plan remains appropriate    Co-evaluation              AM-PAC PT "6 Clicks" Daily Activity  Outcome Measure  Difficulty turning over in bed (including adjusting bedclothes, sheets and blankets)?: A Little Difficulty moving from lying on back to sitting on the side of the bed? : A Little Difficulty sitting down on and standing up from a chair with arms (e.g., wheelchair, bedside  commode, etc,.)?: Unable Help needed moving to and from a bed to chair (including a wheelchair)?: A Little Help needed walking in hospital room?: A Little Help needed climbing 3-5 steps with a railing? : A Lot 6 Click Score: 15    End of Session Equipment Utilized During Treatment: Gait belt Activity Tolerance: Patient tolerated treatment well Patient left: in bed;with call bell/phone within reach;with bed alarm set Nurse Communication: Mobility status PT Visit Diagnosis: Unsteadiness on feet (R26.81);History of falling (Z91.81)     Time: 2924-4628 PT Time Calculation (min) (ACUTE ONLY): 34 min  Charges:  $Gait Training: 8-22 mins $Therapeutic Exercise: 8-22 mins                     Carmelia Bake, PT, DPT 12/14/2017 Pager: [phone]  York Ram E 12/14/2017, 12:33 PM

## 2017-12-14 NOTE — Discharge Instructions (Signed)
Bacteremia °Bacteremia is the presence of bacteria in the blood. When bacteria enter the bloodstream, they can cause a life-threatening reaction called sepsis, which is a medical emergency. Bacteremia can spread to other parts of the body, including the heart, joints, and brain. °What are the causes? °This condition is caused by bacteria that get into the blood. Bacteria can enter the blood: °· From a skin infection or a cut on your skin. °· During an episode of pneumonia. °· From an infection in your stomach or intestine (gastrointestinal infection). °· From an infection in your bladder or urinary system (urinary tract infection). °· During a dental or medical procedure. °· After you brush your teeth so hard that your gums bleed. °· When a bacterial infection in another part of the body spreads to the blood. °· Through a dirty needle. ° °What increases the risk? °This condition is more likely to develop in: °· Children. °· Elderly adults. °· People who have a long-lasting (chronic) disease or medical condition. °· People who have an artificial joint or heart valve. °· People who have heart valve disease. °· People who have a tube, such as a catheter or IV tube, that has been inserted for a medical treatment. °· People who have a weak body defense system (immune system). °· People who use IV drugs. ° °What are the signs or symptoms? °Symptoms of this condition include: °· Fever. °· Chills. °· A racing heart. °· Shortness of breath. °· Dizziness. °· Weakness. °· Confusion. °· Nausea or vomiting. °· Diarrhea. ° °In some cases, there are no symptoms. Bacteremia that has spread to the other parts of the body may cause symptoms in those areas. °How is this diagnosed? °This condition may be diagnosed with a physical exam and tests, such as: °· A complete blood count (CBC). This test looks for signs of infection. °· Blood cultures. These look for bacteria in your blood. °· Tests of any tubes that you may have inserted into  your body, such as an IV tube or urinary catheter. These tests look for a source of infection. °· Urine tests including urine cultures. These look for bacteria in the urine that could be a source of infection. °· Imaging tests, such as an X-ray, CT scan, MRI, or heart ultrasound. These look for a source of infection in other parts of the body, such as the lungs, heart valves, or joints. ° °How is this treated? °This condition may be treated with: °· Antibiotic medicines given through an IV infusion. Depending on the source of infection, antibiotics may be needed for several weeks. At first, an antibiotic may be given to kill most types of blood bacteria. If your test results show that a certain kind of bacteria is causing problems, the antibiotic may be changed to kill only the bacteria that are causing problems. °· Antibiotics taken by mouth. °· IV fluids to support the body as you fight the infection. °· Removing any catheter or device that could be a source of infection. °· Blood pressure and breathing support, if you have sepsis. °· Surgery to control the source or spread of infection, such as: °? Removing an infected implanted device. °? Removing infected tissue or an abscess. ° °This condition is usually treated at a hospital. If you are treated at home, you may need to come back for medicines, blood tests, and evaluation. This is important. °Follow these instructions at home: °· Take over-the-counter and prescription medicines only as told by your health care provider. °·   If you were prescribed an antibiotic, take it as told by your health care provider. Do not stop taking the antibiotic even if you start to feel better. °· Rest until your condition is under control. °· Drink enough fluid to keep your urine clear or pale yellow. °· Do not smoke. If you need help quitting, ask your health care provider. °· Keep all follow-up visits as told by your health care provider. This is important. °How is this  prevented? °· Get the vaccinations that your health care provider recommends. °· Clean and cover any scrapes or cuts. °· Take good care of your skin. This includes regular bathing and moisturizing. °· Wash your hands often. °· Practice good oral hygiene. Brush your teeth two times a day and floss regularly. °Get help right away if: °· You have pain. °· You have a fever. °· You have trouble breathing. °· Your skin becomes blotchy, pale, or clammy. °· You develop confusion, dizziness, or weakness. °· You develop diarrhea. °· You develop any new symptoms after treatment. °Summary °· Bacteremia is the presence of bacteria in the blood. When bacteria enter the bloodstream, they can cause a life- threatening reaction called sepsis. °· Children and elderly adults are at increased risk of bacteremia. Other risk factors include having a long-lasting (chronic) disease or a weak immune system, having an artificial joint or heart valve, having heart valve disease, having tubes that were inserted in the body for medical treatment, or using IV drugs. °· Some symptoms of bacteremia include fever, chills, shortness of breath, confusion, nausea or vomiting, and diarrhea. °· Tests may be done to diagnose a source of infection that led to bacteremia. These tests may include blood tests, urine tests, and imaging tests. °· Bacteremia is usually treated with antibiotics, usually in a hospital. °This information is not intended to replace advice given to you by your health care provider. Make sure you discuss any questions you have with your health care provider. °Document Released: 01/24/2006 Document Revised: 03/09/2016 Document Reviewed: 03/09/2016 °Elsevier Interactive Patient Education © 2018 [employer] ° °

## 2017-12-15 DIAGNOSIS — H409 Unspecified glaucoma: Secondary | ICD-10-CM | POA: Diagnosis present

## 2017-12-15 DIAGNOSIS — E871 Hypo-osmolality and hyponatremia: Secondary | ICD-10-CM | POA: Diagnosis present

## 2017-12-15 DIAGNOSIS — M25572 Pain in left ankle and joints of left foot: Secondary | ICD-10-CM | POA: Diagnosis not present

## 2017-12-15 DIAGNOSIS — Z888 Allergy status to other drugs, medicaments and biological substances status: Secondary | ICD-10-CM | POA: Diagnosis not present

## 2017-12-15 DIAGNOSIS — R509 Fever, unspecified: Secondary | ICD-10-CM | POA: Diagnosis not present

## 2017-12-15 DIAGNOSIS — J189 Pneumonia, unspecified organism: Secondary | ICD-10-CM | POA: Diagnosis not present

## 2017-12-15 DIAGNOSIS — Z809 Family history of malignant neoplasm, unspecified: Secondary | ICD-10-CM | POA: Diagnosis not present

## 2017-12-15 DIAGNOSIS — R52 Pain, unspecified: Secondary | ICD-10-CM | POA: Diagnosis not present

## 2017-12-15 DIAGNOSIS — R6510 Systemic inflammatory response syndrome (SIRS) of non-infectious origin without acute organ dysfunction: Secondary | ICD-10-CM | POA: Diagnosis not present

## 2017-12-15 DIAGNOSIS — K760 Fatty (change of) liver, not elsewhere classified: Secondary | ICD-10-CM | POA: Diagnosis present

## 2017-12-15 DIAGNOSIS — B961 Klebsiella pneumoniae [K. pneumoniae] as the cause of diseases classified elsewhere: Secondary | ICD-10-CM | POA: Diagnosis not present

## 2017-12-15 DIAGNOSIS — M7989 Other specified soft tissue disorders: Secondary | ICD-10-CM | POA: Diagnosis not present

## 2017-12-15 DIAGNOSIS — R41841 Cognitive communication deficit: Secondary | ICD-10-CM | POA: Diagnosis not present

## 2017-12-15 DIAGNOSIS — L02416 Cutaneous abscess of left lower limb: Secondary | ICD-10-CM | POA: Diagnosis not present

## 2017-12-15 DIAGNOSIS — D649 Anemia, unspecified: Secondary | ICD-10-CM | POA: Diagnosis present

## 2017-12-15 DIAGNOSIS — M6281 Muscle weakness (generalized): Secondary | ICD-10-CM | POA: Diagnosis not present

## 2017-12-15 DIAGNOSIS — A419 Sepsis, unspecified organism: Secondary | ICD-10-CM | POA: Diagnosis not present

## 2017-12-15 DIAGNOSIS — I10 Essential (primary) hypertension: Secondary | ICD-10-CM | POA: Diagnosis present

## 2017-12-15 DIAGNOSIS — D72829 Elevated white blood cell count, unspecified: Secondary | ICD-10-CM | POA: Diagnosis not present

## 2017-12-15 DIAGNOSIS — I350 Nonrheumatic aortic (valve) stenosis: Secondary | ICD-10-CM | POA: Diagnosis not present

## 2017-12-15 DIAGNOSIS — R7881 Bacteremia: Secondary | ICD-10-CM | POA: Diagnosis not present

## 2017-12-15 DIAGNOSIS — R269 Unspecified abnormalities of gait and mobility: Secondary | ICD-10-CM | POA: Diagnosis not present

## 2017-12-15 DIAGNOSIS — L03116 Cellulitis of left lower limb: Secondary | ICD-10-CM | POA: Diagnosis not present

## 2017-12-15 DIAGNOSIS — Z87891 Personal history of nicotine dependence: Secondary | ICD-10-CM | POA: Diagnosis not present

## 2017-12-15 DIAGNOSIS — R740 Nonspecific elevation of levels of transaminase and lactic acid dehydrogenase [LDH]: Secondary | ICD-10-CM | POA: Diagnosis not present

## 2017-12-15 DIAGNOSIS — M1990 Unspecified osteoarthritis, unspecified site: Secondary | ICD-10-CM | POA: Diagnosis present

## 2017-12-15 DIAGNOSIS — M25472 Effusion, left ankle: Secondary | ICD-10-CM | POA: Diagnosis not present

## 2017-12-15 DIAGNOSIS — Z8546 Personal history of malignant neoplasm of prostate: Secondary | ICD-10-CM | POA: Diagnosis not present

## 2017-12-15 DIAGNOSIS — D72823 Leukemoid reaction: Secondary | ICD-10-CM | POA: Diagnosis not present

## 2017-12-15 DIAGNOSIS — R609 Edema, unspecified: Secondary | ICD-10-CM | POA: Diagnosis not present

## 2017-12-15 DIAGNOSIS — R278 Other lack of coordination: Secondary | ICD-10-CM | POA: Diagnosis not present

## 2017-12-15 DIAGNOSIS — R945 Abnormal results of liver function studies: Secondary | ICD-10-CM | POA: Diagnosis not present

## 2017-12-15 DIAGNOSIS — Z79899 Other long term (current) drug therapy: Secondary | ICD-10-CM | POA: Diagnosis not present

## 2017-12-15 DIAGNOSIS — I080 Rheumatic disorders of both mitral and aortic valves: Secondary | ICD-10-CM | POA: Diagnosis present

## 2017-12-15 NOTE — Clinical Social Work Placement (Addendum)
[employer] authorization received. Patient son will transport.   CLINICAL SOCIAL WORK PLACEMENT  NOTE  Date:  12/15/2017  Patient Details  Name: Jeffrey Porter MRN: [id number] Date of Birth: [birth year]  Clinical Social Work is seeking post-discharge placement for this patient at the [place] level of care (*CSW will initial, date and re-position this form in  chart as items are completed):  Yes   Patient/family provided with [place] Work Department's list of facilities offering this level of care within the geographic area requested by the patient (or if unable, by the patient's family).  Yes   Patient/family informed of their freedom to choose among providers that offer the needed level of care, that participate in Medicare, Medicaid or managed care program needed by the patient, have an available bed and are willing to accept the patient.  Yes   Patient/family informed of 's ownership interest in [facility] and [facility], as well as of the fact that they are under no obligation to receive care at these facilities.  PASRR submitted to EDS on 12/12/17     PASRR number received on    12/12/2017  [id number] A    Existing PASRR number confirmed on       FL2 transmitted to all facilities in geographic area requested by pt/family on       FL2 transmitted to all facilities within larger geographic area on 12/15/17     Patient informed that his/her managed care company has contracts with or will negotiate with certain facilities, including the following:  [employer]     Yes   Patient/family informed of bed offers received.  Patient chooses bed at [facility]     Physician recommends and patient chooses bed at      Patient to be transferred to [facility] on 12/15/17.  Patient to be transferred to facility by [facility]      Patient family notified on 12/15/17 of transfer.  Name of family member notified:  Son in law/  Daughter      PHYSICIAN       Additional Comment:    _______________________________________________ Lia Hopping, LCSW 12/15/2017, 10:31 AM

## 2017-12-15 NOTE — Progress Notes (Signed)
Pt being discharged to [facility]. Son is taking him to facility. Discharge instructions reviewed with patient and report called to [facility]. Patient taken to main entrance by wheelchair.

## 2017-12-15 NOTE — Progress Notes (Signed)
Discharge summary done on 12/15/2017. No change to discharge instructions or medications. Patient still medically stable and ready for discharge to SNF.  Time spent: 15 minutes  Suhana Wilner D.O. [facility] Pager [phone]  If 7PM-7AM, please contact night-coverage [email or web address] Password [facility] 12/15/2017, 10:58 AM

## 2017-12-15 NOTE — Care Management Important Message (Signed)
Important Message  Patient Details  Name: Jeffrey Porter MRN: [id number] Date of Birth: 11-09-1931   Medicare Important Message Given:  Yes    Kerin Salen 12/15/2017, 11:16 AMImportant Message  Patient Details  Name: Jeffrey Porter MRN: [id number] Date of Birth: February 18, 1932   Medicare Important Message Given:  Yes    Kerin Salen 12/15/2017, 11:16 AM

## 2017-12-16 DIAGNOSIS — R740 Nonspecific elevation of levels of transaminase and lactic acid dehydrogenase [LDH]: Secondary | ICD-10-CM | POA: Diagnosis not present

## 2017-12-16 DIAGNOSIS — J189 Pneumonia, unspecified organism: Secondary | ICD-10-CM | POA: Diagnosis not present

## 2017-12-16 DIAGNOSIS — R7881 Bacteremia: Secondary | ICD-10-CM | POA: Diagnosis not present

## 2017-12-19 DIAGNOSIS — R269 Unspecified abnormalities of gait and mobility: Secondary | ICD-10-CM | POA: Diagnosis not present

## 2017-12-19 DIAGNOSIS — M25572 Pain in left ankle and joints of left foot: Secondary | ICD-10-CM | POA: Diagnosis not present

## 2017-12-19 DIAGNOSIS — R509 Fever, unspecified: Secondary | ICD-10-CM | POA: Diagnosis not present

## 2017-12-20 DIAGNOSIS — M25572 Pain in left ankle and joints of left foot: Secondary | ICD-10-CM | POA: Diagnosis not present

## 2017-12-21 ENCOUNTER — Emergency Department (HOSPITAL_COMMUNITY): Payer: Medicare HMO

## 2017-12-21 ENCOUNTER — Encounter (HOSPITAL_COMMUNITY): Payer: Self-pay

## 2017-12-21 ENCOUNTER — Other Ambulatory Visit: Payer: Self-pay

## 2017-12-21 ENCOUNTER — Inpatient Hospital Stay (HOSPITAL_COMMUNITY)
Admission: EM | Admit: 2017-12-21 | Discharge: 2017-12-23 | DRG: 872 | Disposition: A | Discharge status: Skilled Nursing Facility | Payer: Medicare HMO | Source: Skilled Nursing Facility | Attending: Internal Medicine | Admitting: Internal Medicine

## 2017-12-21 DIAGNOSIS — M1990 Unspecified osteoarthritis, unspecified site: Secondary | ICD-10-CM | POA: Diagnosis present

## 2017-12-21 DIAGNOSIS — R6510 Systemic inflammatory response syndrome (SIRS) of non-infectious origin without acute organ dysfunction: Secondary | ICD-10-CM

## 2017-12-21 DIAGNOSIS — D72829 Elevated white blood cell count, unspecified: Secondary | ICD-10-CM | POA: Diagnosis present

## 2017-12-21 DIAGNOSIS — D72823 Leukemoid reaction: Secondary | ICD-10-CM | POA: Diagnosis not present

## 2017-12-21 DIAGNOSIS — Z888 Allergy status to other drugs, medicaments and biological substances status: Secondary | ICD-10-CM | POA: Diagnosis not present

## 2017-12-21 DIAGNOSIS — R7989 Other specified abnormal findings of blood chemistry: Secondary | ICD-10-CM

## 2017-12-21 DIAGNOSIS — Z809 Family history of malignant neoplasm, unspecified: Secondary | ICD-10-CM | POA: Diagnosis not present

## 2017-12-21 DIAGNOSIS — D649 Anemia, unspecified: Secondary | ICD-10-CM | POA: Diagnosis present

## 2017-12-21 DIAGNOSIS — D473 Essential (hemorrhagic) thrombocythemia: Secondary | ICD-10-CM

## 2017-12-21 DIAGNOSIS — I350 Nonrheumatic aortic (valve) stenosis: Secondary | ICD-10-CM | POA: Diagnosis not present

## 2017-12-21 DIAGNOSIS — R509 Fever, unspecified: Secondary | ICD-10-CM | POA: Diagnosis not present

## 2017-12-21 DIAGNOSIS — M7989 Other specified soft tissue disorders: Secondary | ICD-10-CM | POA: Diagnosis not present

## 2017-12-21 DIAGNOSIS — M25472 Effusion, left ankle: Secondary | ICD-10-CM | POA: Diagnosis present

## 2017-12-21 DIAGNOSIS — R945 Abnormal results of liver function studies: Secondary | ICD-10-CM

## 2017-12-21 DIAGNOSIS — B961 Klebsiella pneumoniae [K. pneumoniae] as the cause of diseases classified elsewhere: Secondary | ICD-10-CM | POA: Diagnosis not present

## 2017-12-21 DIAGNOSIS — K760 Fatty (change of) liver, not elsewhere classified: Secondary | ICD-10-CM | POA: Diagnosis present

## 2017-12-21 DIAGNOSIS — L03116 Cellulitis of left lower limb: Secondary | ICD-10-CM | POA: Diagnosis not present

## 2017-12-21 DIAGNOSIS — I10 Essential (primary) hypertension: Secondary | ICD-10-CM | POA: Diagnosis not present

## 2017-12-21 DIAGNOSIS — Z87891 Personal history of nicotine dependence: Secondary | ICD-10-CM

## 2017-12-21 DIAGNOSIS — A419 Sepsis, unspecified organism: Principal | ICD-10-CM | POA: Diagnosis present

## 2017-12-21 DIAGNOSIS — M25572 Pain in left ankle and joints of left foot: Secondary | ICD-10-CM | POA: Diagnosis not present

## 2017-12-21 DIAGNOSIS — Z79899 Other long term (current) drug therapy: Secondary | ICD-10-CM

## 2017-12-21 DIAGNOSIS — I080 Rheumatic disorders of both mitral and aortic valves: Secondary | ICD-10-CM | POA: Diagnosis present

## 2017-12-21 DIAGNOSIS — R41841 Cognitive communication deficit: Secondary | ICD-10-CM | POA: Diagnosis not present

## 2017-12-21 DIAGNOSIS — R278 Other lack of coordination: Secondary | ICD-10-CM | POA: Diagnosis not present

## 2017-12-21 DIAGNOSIS — R7881 Bacteremia: Secondary | ICD-10-CM | POA: Diagnosis not present

## 2017-12-21 DIAGNOSIS — E871 Hypo-osmolality and hyponatremia: Secondary | ICD-10-CM | POA: Diagnosis not present

## 2017-12-21 DIAGNOSIS — H409 Unspecified glaucoma: Secondary | ICD-10-CM | POA: Diagnosis present

## 2017-12-21 DIAGNOSIS — R609 Edema, unspecified: Secondary | ICD-10-CM | POA: Diagnosis not present

## 2017-12-21 DIAGNOSIS — Z8546 Personal history of malignant neoplasm of prostate: Secondary | ICD-10-CM | POA: Diagnosis not present

## 2017-12-21 DIAGNOSIS — D75839 Thrombocytosis, unspecified: Secondary | ICD-10-CM

## 2017-12-21 DIAGNOSIS — L02416 Cutaneous abscess of left lower limb: Secondary | ICD-10-CM | POA: Diagnosis not present

## 2017-12-21 DIAGNOSIS — M6281 Muscle weakness (generalized): Secondary | ICD-10-CM | POA: Diagnosis not present

## 2017-12-21 DIAGNOSIS — R52 Pain, unspecified: Secondary | ICD-10-CM | POA: Diagnosis not present

## 2017-12-21 LAB — COMPREHENSIVE METABOLIC PANEL
ALBUMIN: 3.5 g/dL (ref 3.5–5.0)
ALK PHOS: 425 U/L — AB (ref 38–126)
ALT: 54 U/L — AB (ref 0–44)
AST: 77 U/L — ABNORMAL HIGH (ref 15–41)
Anion gap: 13 (ref 5–15)
BUN: 16 mg/dL (ref 8–23)
CALCIUM: 8.9 mg/dL (ref 8.9–10.3)
CO2: 26 mmol/L (ref 22–32)
Chloride: 90 mmol/L — ABNORMAL LOW (ref 98–111)
Creatinine, Ser: 0.54 mg/dL — ABNORMAL LOW (ref 0.61–1.24)
GFR calc Af Amer: >60 mL/min (ref >60)
GFR calc non Af Amer: >60 mL/min (ref >60)
GLUCOSE: 85 mg/dL (ref 70–99)
Potassium: 4.4 mmol/L (ref 3.5–5.1)
SODIUM: 129 mmol/L — AB (ref 135–145)
Total Bilirubin: 2.3 mg/dL — ABNORMAL HIGH (ref 0.3–1.2)
Total Protein: 7.7 g/dL (ref 6.5–8.1)

## 2017-12-21 LAB — C-REACTIVE PROTEIN: CRP: 23.2 mg/dL — AB (ref <1.0)

## 2017-12-21 LAB — I-STAT CG4 LACTIC ACID, ED
LACTIC ACID, VENOUS: 1.04 mmol/L (ref 0.5–1.9)
Lactic Acid, Venous: 1.48 mmol/L (ref 0.5–1.9)

## 2017-12-21 LAB — CBC WITH DIFFERENTIAL/PLATELET
Basophils Absolute: 0 10*3/uL (ref 0.0–0.1)
Basophils Relative: 0 %
EOS ABS: 0.1 10*3/uL (ref 0.0–0.7)
Eosinophils Relative: 0 %
HEMATOCRIT: 38.4 % — AB (ref 39.0–52.0)
Hemoglobin: 13.5 g/dL (ref 13.0–17.0)
LYMPHS ABS: 1.4 10*3/uL (ref 0.7–4.0)
LYMPHS PCT: 7 %
MCH: 30.3 pg (ref 26.0–34.0)
MCHC: 35.2 g/dL (ref 30.0–36.0)
MCV: 86.3 fL (ref 78.0–100.0)
MONOS PCT: 7 %
Monocytes Absolute: 1.3 10*3/uL — ABNORMAL HIGH (ref 0.1–1.0)
Neutro Abs: 17 10*3/uL — ABNORMAL HIGH (ref 1.7–7.7)
Neutrophils Relative %: 86 %
Platelets: 731 10*3/uL — ABNORMAL HIGH (ref 150–400)
RBC: 4.45 MIL/uL (ref 4.22–5.81)
RDW: 12.8 % (ref 11.5–15.5)
WBC: 19.8 10*3/uL — ABNORMAL HIGH (ref 4.0–10.5)

## 2017-12-21 LAB — URIC ACID: Uric Acid, Serum: 3.1 mg/dL — ABNORMAL LOW (ref 3.7–8.6)

## 2017-12-21 LAB — URINALYSIS, ROUTINE W REFLEX MICROSCOPIC
Bilirubin Urine: NEGATIVE
GLUCOSE, UA: NEGATIVE mg/dL
Ketones, ur: NEGATIVE mg/dL
LEUKOCYTES UA: NEGATIVE
Nitrite: NEGATIVE
PH: 6 (ref 5.0–8.0)
Protein, ur: NEGATIVE mg/dL
SPECIFIC GRAVITY, URINE: 1.015 (ref 1.005–1.030)

## 2017-12-21 LAB — PROTIME-INR
INR: 1.09
PROTHROMBIN TIME: 14 s (ref 11.4–15.2)

## 2017-12-21 LAB — SEDIMENTATION RATE: SED RATE: 73 mm/h — AB (ref 0–16)

## 2017-12-21 MED ORDER — MORPHINE SULFATE (PF) 4 MG/ML IV SOLN
4.0000 mg | Freq: Once | INTRAVENOUS | Status: DC
  Filled 2017-12-21: qty 1

## 2017-12-21 MED ORDER — NIACIN 100 MG PO TABS
100.0000 mg | ORAL_TABLET | Freq: Every day | ORAL | Status: DC
  Administered 2017-12-21 – 2017-12-22 (×2): 100 mg via ORAL
  Filled 2017-12-21 (×2): qty 1

## 2017-12-21 MED ORDER — MAGNESIUM 200 MG PO TABS
200.0000 mg | ORAL_TABLET | Freq: Every day | ORAL | Status: DC
  Administered 2017-12-22 – 2017-12-23 (×2): 200 mg via ORAL
  Filled 2017-12-21 (×4): qty 1

## 2017-12-21 MED ORDER — ONDANSETRON HCL 4 MG PO TABS: 4.0000 mg | ORAL_TABLET | Freq: Four times a day (QID) | ORAL | Status: DC | PRN

## 2017-12-21 MED ORDER — ENOXAPARIN SODIUM 40 MG/0.4ML ~~LOC~~ SOLN
40.0000 mg | SUBCUTANEOUS | Status: DC
  Administered 2017-12-21 – 2017-12-22 (×2): 40 mg via SUBCUTANEOUS
  Filled 2017-12-21 (×2): qty 0.4

## 2017-12-21 MED ORDER — MELOXICAM 7.5 MG PO TABS
7.5000 mg | ORAL_TABLET | Freq: Every day | ORAL | Status: DC | PRN
  Filled 2017-12-21: qty 1

## 2017-12-21 MED ORDER — LIDOCAINE HCL (PF) 1 % IJ SOLN: 30.0000 mL | Freq: Once | INTRAMUSCULAR | Status: DC

## 2017-12-21 MED ORDER — ACETAMINOPHEN 500 MG PO TABS: 500.0000 mg | ORAL_TABLET | Freq: Every day | ORAL | Status: DC | PRN

## 2017-12-21 MED ORDER — LUTEIN-ZEAXANTHIN 15-0.7 MG PO CAPS: ORAL_CAPSULE | Freq: Every day | ORAL | Status: DC

## 2017-12-21 MED ORDER — ONDANSETRON HCL 4 MG/2ML IJ SOLN: 4.0000 mg | Freq: Four times a day (QID) | INTRAMUSCULAR | Status: DC | PRN

## 2017-12-21 MED ORDER — CARBIDOPA-LEVODOPA 25-100 MG PO TABS
1.0000 | ORAL_TABLET | Freq: Every day | ORAL | Status: DC
  Administered 2017-12-22 – 2017-12-23 (×2): 1 via ORAL
  Filled 2017-12-21 (×2): qty 1

## 2017-12-21 MED ORDER — SODIUM CHLORIDE 0.9 % IV SOLN
1.0000 g | Freq: Once | INTRAVENOUS | Status: AC
  Administered 2017-12-21: 1 g via INTRAVENOUS
  Filled 2017-12-21: qty 10

## 2017-12-21 MED ORDER — AMLODIPINE BESYLATE 5 MG PO TABS
5.0000 mg | ORAL_TABLET | Freq: Every day | ORAL | Status: DC
  Administered 2017-12-22 – 2017-12-23 (×2): 5 mg via ORAL
  Filled 2017-12-21 (×2): qty 1

## 2017-12-21 MED ORDER — SODIUM CHLORIDE 0.9 % IV SOLN
INTRAVENOUS | Status: DC
  Administered 2017-12-21 – 2017-12-22 (×2): via INTRAVENOUS

## 2017-12-21 MED ORDER — ADULT MULTIVITAMIN W/MINERALS CH
1.0000 | ORAL_TABLET | Freq: Every day | ORAL | Status: DC
  Administered 2017-12-22 – 2017-12-23 (×2): 1 via ORAL
  Filled 2017-12-21 (×2): qty 1

## 2017-12-21 MED ORDER — OCUVITE-LUTEIN PO CAPS
1.0000 | ORAL_CAPSULE | Freq: Every day | ORAL | Status: DC
  Administered 2017-12-22 – 2017-12-23 (×2): 1 via ORAL
  Filled 2017-12-21 (×2): qty 1

## 2017-12-21 MED ORDER — LATANOPROST 0.005 % OP SOLN
1.0000 [drp] | Freq: Every day | OPHTHALMIC | Status: DC
  Administered 2017-12-21 – 2017-12-22 (×2): 1 [drp] via OPHTHALMIC
  Filled 2017-12-21: qty 2.5

## 2017-12-21 MED ORDER — VITAMIN D 1000 UNITS PO TABS
2000.0000 [IU] | ORAL_TABLET | Freq: Every day | ORAL | Status: DC
  Administered 2017-12-22 – 2017-12-23 (×2): 2000 [IU] via ORAL
  Filled 2017-12-21 (×2): qty 2

## 2017-12-21 NOTE — ED Notes (Signed)
Bed: QZ30 Expected date:  Expected time:  Means of arrival:  Comments: EMS/Fever/swelling

## 2017-12-21 NOTE — ED Provider Notes (Signed)
[place] DEPT Provider Note   CSN: [id number] Arrival date & time: 12/21/17  1237     History   Chief Complaint Chief Complaint  Patient presents with  . Fever    HPI Tayt Moyers is a 84 y.o. male with history of prostate cancer, aortic stenosis, hypertension, recent admission for sepsis secondary to Klebsiella bacteremia, pneumonia and rhabdomyolysis due to a fall at home, is here for evaluation of left ankle pain.  Pain is sudden, moderate, localized to the left lateral ankle.  Onset on Monday, gradually worsening.  Worse with weightbearing, palpation and movement.  Associated with low-grade fever 100.9 noted at living facility yesterday.  Patient was seen by facility physician who obtained blood work that was remarkable for WBC of 25.1.  Per paperwork at bedside, patient had a normal chest x-ray and a normal left ankle x-ray.  Patient states he was sent to the ER for joint aspiration, uric acid levels.  Interventions include 1 g of Tylenol prior to arrival and ceftriaxone.  No associated chills, nausea, vomiting, sore throat, cough, chest pain, abdominal pain, dysuria, frequency, diarrhea.  Patient otherwise feels at baseline.  No history of gout.  No history of left ankle surgery or injuries.  HPI  Past Medical History:  Diagnosis Date  . Glaucoma   . Prostate cancer [facility])     Patient Active Problem List   Diagnosis Date Noted  . Left ankle swelling   . Acute respiratory failure with hypoxia ([place])   . Paroxysmal supraventricular tachycardia ([place])   . Sepsis ([place]) 12/06/2017  . Acute lower UTI 12/06/2017  . Tachycardia 12/06/2017  . Leukocytosis 12/06/2017    Past Surgical History:  Procedure Laterality Date  . CATARACT EXTRACTION    . HERNIA REPAIR          Home Medications    Prior to Admission medications   Medication Sig Start Date End Date Taking? Authorizing Provider  acetaminophen (TYLENOL) 500 MG tablet Take 500 mg by  mouth daily as needed for moderate pain.   Yes [provider]  amLODipine (NORVASC) 5 MG tablet Take 1 tablet (5 mg total) by mouth daily. 12/15/17  Yes Mikhail, Maryann, DO  carbidopa-levodopa (SINEMET IR) 25-100 MG tablet Take 1 tablet by mouth daily. For 2 days   Yes [provider]  Cholecalciferol (VITAMIN D) 2000 units tablet Take 2,000 Units by mouth daily.   Yes [provider]  latanoprost (XALATAN) 0.005 % ophthalmic solution Place 1 drop into both eyes at bedtime.    Yes [provider]  LUTEIN-ZEAXANTHIN PO Take 1 tablet by mouth daily.   Yes [provider]  Magnesium 250 MG TABS Take 250 mg by mouth daily.   Yes [provider]  meloxicam (MOBIC) 7.5 MG tablet Take 7.5 mg by mouth daily as needed for pain. 11/23/17  Yes [provider]  Multiple Vitamin (MULTIVITAMIN WITH MINERALS) TABS tablet Take 1 tablet by mouth daily.   Yes [provider]  niacin 100 MG tablet Take 100 mg by mouth at bedtime.   Yes [provider]  cefTRIAXone (ROCEPHIN) IVPB Inject 1 g into the vein once. For leukocytosis    [provider]    Family History Family History  Problem Relation Age of Onset  . Cancer Mother   . Cancer Father     Social History Social History   Tobacco Use  . Smoking status: Former Smoker    Types: Cigarettes  .  Smokeless tobacco: Never Used  Substance Use Topics  . Alcohol use: Yes    Alcohol/week: 1.0 standard drinks    Types: 1 Cans of beer per week  . Drug use: Not on file     Allergies   Timolol maleate   Review of Systems Review of Systems  Constitutional: Positive for fever.  Musculoskeletal: Positive for arthralgias, gait problem and joint swelling.  All other systems reviewed and are negative.    Physical Exam Updated Vital Signs BP (!) 129/58   Pulse 95   Temp 100 F (37.8 C) (Oral)   Resp (!) 25   SpO2 95%   Physical Exam  Constitutional: He is  oriented to person, place, and time. He appears well-developed and well-nourished.  Non toxic.  HENT:  Head: Normocephalic and atraumatic.  Nose: Nose normal.  Moist mucous membranes   Eyes: Pupils are equal, round, and reactive to light. Conjunctivae and EOM are normal.  Neck: Normal range of motion.  Cardiovascular: Normal rate, regular rhythm and intact distal pulses.  Murmur heard. 2+ DP and radial pulses bilaterally. No LE edema or calf tenderness.   Pulmonary/Chest: Effort normal and breath sounds normal.  Abdominal: Soft. Bowel sounds are normal. There is no tenderness.  No G/R/R. No suprapubic or CVA tenderness. Negative Murphy's and McBurney's  Musculoskeletal: Normal range of motion. He exhibits edema and tenderness.  Mild edema, erythema, warmth of the left lateral ankle extending to the left lateral midfoot and above malleolus.  Focal tenderness to posterior aspect of lateral malleolus.  Decreased range of motion secondary to pain.  Neurological: He is alert and oriented to person, place, and time.  Sensation to light touch in feet intact bilaterally.  5/5 strength with ankle flexion and extension.  Skin: Skin is warm and dry. Capillary refill takes less than 2 seconds.  Psychiatric: He has a normal mood and affect. His behavior is normal. Judgment and thought content normal.  Nursing note and vitals reviewed.    ED Treatments / Results  Labs (all labs ordered are listed, but only abnormal results are displayed) Labs Reviewed  COMPREHENSIVE METABOLIC PANEL - Abnormal; Notable for the following components:      Result Value   Sodium 129 (*)    Chloride 90 (*)    Creatinine, Ser 0.54 (*)    AST 77 (*)    ALT 54 (*)    Alkaline Phosphatase 425 (*)    Total Bilirubin 2.3 (*)    All other components within normal limits  CBC WITH DIFFERENTIAL/PLATELET - Abnormal; Notable for the following components:   WBC 19.8 (*)    HCT 38.4 (*)    Platelets 731 (*)    Neutro Abs  17.0 (*)    Monocytes Absolute 1.3 (*)    All other components within normal limits  URINALYSIS, ROUTINE W REFLEX MICROSCOPIC - Abnormal; Notable for the following components:   Color, Urine AMBER (*)    APPearance HAZY (*)    Hgb urine dipstick SMALL (*)    All other components within normal limits  URIC ACID - Abnormal; Notable for the following components:   Uric Acid, Serum 3.1 (*)    All other components within normal limits  SEDIMENTATION RATE - Abnormal; Notable for the following components:   Sed Rate 73 (*)    All other components within normal limits  C-REACTIVE PROTEIN - Abnormal; Notable for the following components:   CRP 23.2 (*)    All other components within  normal limits  CULTURE, BLOOD (ROUTINE X 2)  CULTURE, BLOOD (ROUTINE X 2)  URINE CULTURE  BODY FLUID CULTURE  PROTIME-INR  I-STAT CG4 LACTIC ACID, ED  I-STAT CG4 LACTIC ACID, ED    EKG None  Radiology Dg Ankle Complete Left  Result Date: 12/21/2017 CLINICAL DATA:  Diffuse ankle pain and swelling for the past 3 days. No known injury. EXAM: LEFT ANKLE COMPLETE - 3+ VIEW COMPARISON:  None. FINDINGS: Peripherally corticated ossicles adjacent to the medial malleolus likely represents sequela of remote avulsive injury. No acute fracture or dislocation. Joint spaces appear preserved. The ankle mortise appears preserved. No joint effusion. Tiny plantar calcaneal spur. Minimal enthesopathic change involving the Achilles tendon insertion site. Distal vascular calcifications. Regional soft tissues appear otherwise normal. IMPRESSION: 1. No explanation for patient's diffuse ankle pain and swelling. 2. Peripherally corticated ossicles adjacent to the medial malleolus likely represents sequela of remote avulsive injury. 3. Tiny plantar calcaneal spur. Electronically Signed   By: Sandi Mariscal M.D.   On: 12/21/2017 14:59    Procedures .Critical Care Performed by: Kinnie Feil, PA-C Authorized by: Kinnie Feil,  PA-C   Critical care provider statement:    Critical care time (minutes):  45   Critical care was necessary to treat or prevent imminent or life-threatening deterioration of the following conditions:  Sepsis   Critical care was time spent personally by me on the following activities:  Examination of patient, discussions with consultants, evaluation of patient's response to treatment, re-evaluation of patient's condition, review of old charts, ordering and review of radiographic studies and ordering and performing treatments and interventions   I assumed direction of critical care for this patient from another provider in my specialty: no     (including critical care time)  Medications Ordered in ED Medications  morphine 4 MG/ML injection 4 mg (0 mg Intravenous Hold 12/21/17 1424)  lidocaine (PF) (XYLOCAINE) 1 % injection 30 mL (30 mLs Infiltration Refused 12/21/17 1830)  cefTRIAXone (ROCEPHIN) 1 g in sodium chloride 0.9 % 100 mL IVPB (0 g Intravenous Stopped 12/21/17 1828)     Initial Impression / Assessment and Plan / ED Course  I have reviewed the triage vital signs and the nursing notes.  Pertinent labs & imaging results that were available during my care of the patient were reviewed by me and considered in my medical decision making (see chart for details).  Clinical Course as of Dec 22 1902  Wed Dec 21, 2017  1429 AST(!): 77 [CG]  1429 ALT(!): 54 [CG]  1429 Alkaline Phosphatase(!): 425 [CG]  1429 Total Bilirubin(!): 2.3 [CG]  1515 CRP(!): 23.2 [CG]  1540 Sed Rate(!): 73 [CG]  1543 WBC(!): 19.8 [CG]  1611 Spoke to Dr Ninfa Linden who is in [place], will come see pt in ER. Updated pt.    [CG]  1819 Blackman at bedside    [CG]    Clinical Course User Index [CG] Kinnie Feil, PA-C   Concern for gout versus cellulitis versus septic arthritis.  Less likely osteomyelitis given onset only 2 days ago.  given documented low-grade fever and elevated WBC infectious process is highest on  differential. Will obtain blood work, imaging, reassess. Consider arthrocentesis.   1540: WBC 19.8, sed rate 73, CRP 23.2.  Reported fever 100.9 yesterday.  Given this plus exam findings, high suspicion for infectious process.  X-ray negative.  Will consult orthopedics for recommendations, considering arthrocentesis.  1850: Dr Ninfa Linden evaluated pt in ER and performed arthrocentesis, he favors  cellulitis over septic joint.  HR has been persistently > 90, tachypnic meeting SIRS criteria/early sepsis.  Given risk will request admission for observation, IV abx.  Dr Jonelle Sidle to admit.  Fin al Clinical Impressions(s) / ED Diagnoses   Final diagnoses:  SIRS (systemic inflammatory response syndrome) [facility])    ED Discharge Orders    None       Arlean Hopping 12/21/17 1904    Davonna Belling, MD 12/22/17 [phone]

## 2017-12-21 NOTE — ED Triage Notes (Signed)
Patient arrived via [facility] from [facility]. Nurse at facility called out for patients fever yesterday was 100.9. Patient was given 1000mg  tylenol this morning.Chest x-ray ordered and patient was given rocephin yesterday. Patient is having pain to left ankle with swelling. X-ray of ankle shows negative of fracture. Patient is at his baseline x3.  Hx. Of sepsis,UTI, Pneumonia.  Temp-98.5 per ems.  Bp-130/80 96% RA.

## 2017-12-21 NOTE — H&P (Signed)
.  History and Physical    Jeffrey Porter CHE:[id number] DOB: January 02, 1932 DOA: 12/21/2017  Referring MD/NP/PA: Dr Sherry Ruffing  PCP: [name], MD   Patient coming from: Home  Chief Complaint: Left ankle pain and swelling  HPI: Jeffrey Porter is a 84 y.o. male with medical history significant of hypertension, aortic stenosis, prostate cancer and recent sepsis due to Klebsiella pneumoniae pneumonia who presented to the ER with left ankle pain and swelling.  Symptoms have been going on for about 3 days.  It is associated with low-grade temperature 100.9.  Patient was evaluated by his doctor prior to arriving at the assisted living facility.  He was sent over to the emergency room due to worsening symptoms.  He was found to be having significant swelling of the left knee.  Joint aspiration was performed in the ER.  Work-up is ongoing with cultures and crystal studies however due to leukocytosis and patient has morbidity he is being admitted to the hospital for continued treatment.  He denied any injury.  ED Course: His temperature here is 100, blood pressure 140/64, pulse 104, respiratory 26, oxygen sat 99% room air.  White count is 19.8, platelets 731, sodium 129, creatinine 0.54 with chloride of 90.  Lactic acid 1.04.  X-ray of the left ankle showed no significant bone involvement.  Joint aspiration was performed with body fluid culture as well as crystals culture sent out.  Blood culture was also obtained.  Urinalysis with urine culture was also obtained.  Review of Systems: As per HPI otherwise 10 point review of systems negative.    Past Medical History:  Diagnosis Date  . Glaucoma   . Prostate cancer [facility])     Past Surgical History:  Procedure Laterality Date  . CATARACT EXTRACTION    . HERNIA REPAIR       reports that he has quit smoking. His smoking use included cigarettes. He has never used smokeless tobacco. He reports that he drinks about 1.0 standard drinks of alcohol per week. His  drug history is not on file.  Allergies  Allergen Reactions  . Timolol Maleate Other (See Comments)    Syncope     Family History  Problem Relation Age of Onset  . Cancer Mother   . Cancer Father     Prior to Admission medications   Medication Sig Start Date End Date Taking? Authorizing Provider  acetaminophen (TYLENOL) 500 MG tablet Take 500 mg by mouth daily as needed for moderate pain.   Yes [provider]  amLODipine (NORVASC) 5 MG tablet Take 1 tablet (5 mg total) by mouth daily. 12/15/17  Yes Mikhail, Maryann, DO  carbidopa-levodopa (SINEMET IR) 25-100 MG tablet Take 1 tablet by mouth daily. For 2 days   Yes [provider]  Cholecalciferol (VITAMIN D) 2000 units tablet Take 2,000 Units by mouth daily.   Yes [provider]  latanoprost (XALATAN) 0.005 % ophthalmic solution Place 1 drop into both eyes at bedtime.    Yes [provider]  LUTEIN-ZEAXANTHIN PO Take 1 tablet by mouth daily.   Yes [provider]  Magnesium 250 MG TABS Take 250 mg by mouth daily.   Yes [provider]  meloxicam (MOBIC) 7.5 MG tablet Take 7.5 mg by mouth daily as needed for pain. 11/23/17  Yes [provider]  Multiple Vitamin (MULTIVITAMIN WITH MINERALS) TABS tablet Take 1 tablet by mouth daily.   Yes [provider]  niacin 100 MG tablet Take 100 mg by mouth  at bedtime.   Yes [provider]  cefTRIAXone (ROCEPHIN) IVPB Inject 1 g into the vein once. For leukocytosis    [provider]    Physical Exam: Vitals:   12/21/17 1730 12/21/17 1752 12/21/17 1800 12/21/17 1830  BP: 140/64  125/62 (!) 129/58  Pulse: (!) 104  94 95  Resp: (!) 23  19 (!) 25  Temp:  100 F (37.8 C)    TempSrc:  Oral    SpO2: 96%  94% 95%      Constitutional: NAD, calm, comfortable Vitals:   12/21/17 1730 12/21/17 1752 12/21/17 1800 12/21/17 1830  BP: 140/64  125/62 (!) 129/58  Pulse: (!) 104  94 95  Resp: (!) 23  19 (!) 25    Temp:  100 F (37.8 C)    TempSrc:  Oral    SpO2: 96%  94% 95%   Eyes: PERRL, lids and conjunctivae normal ENMT: Mucous membranes are moist. Posterior pharynx clear of any exudate or lesions.Normal dentition.  Neck: normal, supple, no masses, no thyromegaly Respiratory: clear to auscultation bilaterally, no wheezing, no crackles. Normal respiratory effort. No accessory muscle use.  Cardiovascular: Regular rate and rhythm, no murmurs / rubs / gallops. No extremity edema. 2+ pedal pulses. No carotid bruits.  Abdomen: no tenderness, no masses palpated. No hepatosplenomegaly. Bowel sounds positive.  Musculoskeletal: no clubbing / cyanosis. Right knee joint swelling, red, tender no deformity upper and lower extremities. Good ROM, no contractures. Normal muscle tone.  Skin: no rashes, lesions, ulcers. No induration Neurologic: CN 2-12 grossly intact. Sensation intact, DTR normal. Strength 5/5 in all 4.  Psychiatric: Normal judgment and insight. Alert and oriented x 3. Normal mood.   Labs on Admission: I have personally reviewed following labs and imaging studies  CBC: Recent Labs  Lab 12/21/17 1258  WBC 19.8*  NEUTROABS 17.0*  HGB 13.5  HCT 38.4*  MCV 86.3  PLT 962*   Basic Metabolic Panel: Recent Labs  Lab 12/21/17 1258  NA 129*  K 4.4  CL 90*  CO2 26  GLUCOSE 85  BUN 16  CREATININE 0.54*  CALCIUM 8.9   GFR: Estimated Creatinine Clearance: 53.1 mL/min (A) (by C-G formula based on SCr of 0.54 mg/dL (L)). Liver Function Tests: Recent Labs  Lab 12/21/17 1258  AST 77*  ALT 54*  ALKPHOS 425*  BILITOT 2.3*  PROT 7.7  ALBUMIN 3.5   No results for input(s): LIPASE, AMYLASE in the last 168 hours. No results for input(s): AMMONIA in the last 168 hours. Coagulation Profile: Recent Labs  Lab 12/21/17 1258  INR 1.09   Cardiac Enzymes: No results for input(s): CKTOTAL, CKMB, CKMBINDEX, TROPONINI in the last 168 hours. BNP (last 3 results) No results for input(s):  PROBNP in the last 8760 hours. HbA1C: No results for input(s): HGBA1C in the last 72 hours. CBG: No results for input(s): GLUCAP in the last 168 hours. Lipid Profile: No results for input(s): CHOL, HDL, LDLCALC, TRIG, CHOLHDL, LDLDIRECT in the last 72 hours. Thyroid Function Tests: No results for input(s): TSH, T4TOTAL, FREET4, T3FREE, THYROIDAB in the last 72 hours. Anemia Panel: No results for input(s): VITAMINB12, FOLATE, FERRITIN, TIBC, IRON, RETICCTPCT in the last 72 hours. Urine analysis:    Component Value Date/Time   COLORURINE AMBER (A) 12/21/2017 1311   APPEARANCEUR HAZY (A) 12/21/2017 1311   LABSPEC 1.015 12/21/2017 1311   PHURINE 6.0 12/21/2017 1311   GLUCOSEU NEGATIVE 12/21/2017 1311   HGBUR SMALL (A) 12/21/2017 1311   BILIRUBINUR  NEGATIVE 12/21/2017 1311   KETONESUR NEGATIVE 12/21/2017 1311   PROTEINUR NEGATIVE 12/21/2017 1311   NITRITE NEGATIVE 12/21/2017 1311   LEUKOCYTESUR NEGATIVE 12/21/2017 1311   Sepsis Labs: @LABRCNTIP (procalcitonin:4,lacticidven:4) ) Recent Results (from the past 240 hour(s))  Body fluid culture     Status: None (Preliminary result)   Collection Time: 12/21/17  6:28 PM  Result Value Ref Range Status   Specimen Description   Final    KNEE LEFT Performed at [facility], [place] [address] [place], [place] [address]    Special Requests   Final    Normal Performed at [facility], [place] [address] [place], [place] [address]    Gram Stain   Final    RARE WBC PRESENT, PREDOMINANTLY PMN NO ORGANISMS SEEN Performed at [facility], [place] [address], [place], [place] [address]    Culture PENDING  Incomplete   Report Status PENDING  Incomplete     Radiological Exams on Admission: Dg Ankle Complete Left  Result Date: 12/21/2017 CLINICAL DATA:  Diffuse ankle pain and swelling for the past 3 days. No known injury. EXAM: LEFT ANKLE COMPLETE - 3+ VIEW COMPARISON:  None. FINDINGS: Peripherally  corticated ossicles adjacent to the medial malleolus likely represents sequela of remote avulsive injury. No acute fracture or dislocation. Joint spaces appear preserved. The ankle mortise appears preserved. No joint effusion. Tiny plantar calcaneal spur. Minimal enthesopathic change involving the Achilles tendon insertion site. Distal vascular calcifications. Regional soft tissues appear otherwise normal. IMPRESSION: 1. No explanation for patient's diffuse ankle pain and swelling. 2. Peripherally corticated ossicles adjacent to the medial malleolus likely represents sequela of remote avulsive injury. 3. Tiny plantar calcaneal spur. Electronically Signed   By: Sandi Mariscal M.D.   On: 12/21/2017 14:59    Assessment/Plan Principal Problem:   Sepsis ([place]) Active Problems:   Leukocytosis   Left ankle swelling   Cellulitis and abscess of left leg     #1 early sepsis: Based on patient's presentation.  Empirically will start on vancomycin for possible septic arthritis.  Await cultures of body fluids.  Await also crystal analysis.  Orthopedics has seen the patient in the ER.  #2 left ankle swelling and inflammation: Ruling out infection versus gout.  Await fluid analysis.  #3 history of prostate cancer: Patient currently stable.  Continue home regimen.  #4 hypertension: Blood pressure appears well controlled.  Continue treatment with Norvasc.  #5 leukocytosis: Secondary to early sepsis.  Urinalysis so far negative.  Continue current treatment  DVT prophylaxis: Lovenox  Code Status: Full code  Family Communication: Daughter who is at bedside  Disposition Plan: To be determined  Consults called: Dr. Ninfa Linden, orthopedic surgery  Admission status: Observation  Severity of Illness: The appropriate patient status for this patient is OBSERVATION. Observation status is judged to be reasonable and necessary in order to provide the required intensity of service to ensure the patient's safety. The  patient's presenting symptoms, physical exam findings, and initial radiographic and laboratory data in the context of their medical condition is felt to place them at decreased risk for further clinical deterioration. Furthermore, it is anticipated that the patient will be medically stable for discharge from the hospital within 2 midnights of admission. The following factors support the patient status of observation.   " The patient's presenting symptoms include left ankle swelling. " The physical exam findings include swollen left ankle with tenderness and decreased range of motion. " The initial radiographic and laboratory data  are no evidence of bone involvement.     Barbette Merino MD [facility] Pager [phone]  If 7PM-7AM, please contact night-coverage [email or web address] Password [facility]  12/21/2017, 8:02 PM

## 2017-12-21 NOTE — Consult Note (Signed)
We were asked to consult on this patient today throughout a septic left ankle joint.  He is a very pleasant 84 year old gentleman who is been staying at a nursing facility and had developed a 3-day history of ankle pain and swelling but also redness.  The ED our physicians feel that this is likely cellulitis.  They would like [place] to aspirate his ankle joint though to rule out septic arthritis.  This is also due to the fact that his white blood cell count is almost 20,000 peripherally.  Also due to the fact that the physician at the nursing care facility said that he needed his ankle tapped and said the patient definitely would like his ankle joint tapped based on what he has been told.  Apparently the patient was hospitalized recently due to sepsis.  On exam his vital signs are normal and he is nonseptic appearing.  I agree that his right ankle and foot seem to be more swollen than his left side.  His left foot is cool but his right foot and ankle are warm.  There is a mild red hue along the lateral aspect of the ankle suggesting cellulitis.  He does have some pain palpating this area but he can dorsiflex and plantarflex his ankle with only mild pain that he reports.  We did review x-rays of his left ankle and there are no acute findings.  There is no evidence of fracture.  There is slight spurring medially to suggest an old injury and there is soft tissue swelling.  We did clean the anterior medial aspect of the ankle with Betadine and alcohol and then placed an 18-gauge needle easily into the ankle joint and got just a small amount of fluid out the ankle.  After this I did get some blood as well.  This is too small amount of fluid is sent for cell count we can send it for a Gram stain.  This does not appear to be a septic joint and is most likely more related cellulitis.  If there is concern for any other issues as relates to his ankle they will let us know.  He has been given a dose already of IV Rocephin.

## 2017-12-21 NOTE — Progress Notes (Signed)
CSW received a call from the [facility] ED CN who requested CSW meet with pt/pt family to see what support, if any, is available to the pt besides the husband.  PTAR arrived to p/u the pt before CSW could speak to them.  CSW requested and the EPD will place a consult for CM with an order for face-to-face and RN/Aide/PT/OT and Social Work in case the [employer] ED [place] can upgrade pt's [facility] services.  Per the EDP the pt has some [place] services currently and has possibly declined additional services in the past.  Per notes, pt went to or was to go to [facility] on 12/15/17.  Please reconsult if future social work needs arise.  CSW signing off, as social work intervention is no longer needed.  Alphonse Guild. Jonty Morrical, LCSW, LCAS, CSI Clinical Social Worker Ph: [phone]

## 2017-12-21 NOTE — ED Notes (Signed)
ED TO INPATIENT HANDOFF REPORT  Name/Age/Gender Jeffrey Porter 84 y.o. male  Code Status Code Status History    Date Active Date Inactive Code Status Order ID Comments User Context   12/06/2017 2317 12/15/2017 1529 Full Code [id number]  Jani Gravel, MD ED    Advance Directive Documentation     Most Recent Value  Type of Advance Directive  Healthcare Power of Attorney  Pre-existing out of facility DNR order (yellow form or pink MOST form)  -  "MOST" Form in Place?  -      Home/SNF/Other Home  Chief Complaint left ankle pain   Level of Care/Admitting Diagnosis ED Disposition    ED Disposition Condition [place]: [facility] [100102]  Level of Care: Med-Surg [16]  Diagnosis: Sepsis [facility]) [[id number]]  Admitting Physician: Elwyn Reach [[id number]]  Attending Physician: Elwyn Reach [[id number]]  Estimated length of stay: past midnight tomorrow  Certification:: I certify this patient will need inpatient services for at least 2 midnights  PT Class (Do Not Modify): Inpatient [101]  PT Acc Code (Do Not Modify): Private [1]       Medical History Past Medical History:  Diagnosis Date  . Glaucoma   . Prostate cancer (HCC)     Allergies Allergies  Allergen Reactions  . Timolol Maleate Other (See Comments)    Syncope     IV Location/Drains/Wounds Patient Lines/Drains/Airways Status   Active Line/Drains/Airways    Name:   Placement date:   Placement time:   Site:   Days:   Peripheral IV 12/21/17 Left Forearm   12/21/17    1310    Forearm   less than 1          Labs/Imaging Results for orders placed or performed during the hospital encounter of 12/21/17 (from the past 48 hour(s))  Comprehensive metabolic panel     Status: Abnormal   Collection Time: 12/21/17 12:58 PM  Result Value Ref Range   Sodium 129 (L) 135 - 145 mmol/L   Potassium 4.4 3.5 - 5.1 mmol/L   Chloride 90 (L) 98 - 111 mmol/L   CO2 26 22 - 32 mmol/L   Glucose,  Bld 85 70 - 99 mg/dL   BUN 16 8 - 23 mg/dL   Creatinine, Ser 0.54 (L) 0.61 - 1.24 mg/dL   Calcium 8.9 8.9 - 10.3 mg/dL   Total Protein 7.7 6.5 - 8.1 g/dL   Albumin 3.5 3.5 - 5.0 g/dL   AST 77 (H) 15 - 41 U/L   ALT 54 (H) 0 - 44 U/L   Alkaline Phosphatase 425 (H) 38 - 126 U/L   Total Bilirubin 2.3 (H) 0.3 - 1.2 mg/dL   GFR calc non Af Amer >60 >60 mL/min   GFR calc Af Amer >60 >60 mL/min    Comment: (NOTE) The eGFR has been calculated using the CKD EPI equation. This calculation has not been validated in all clinical situations. eGFR's persistently <60 mL/min signify possible Chronic Kidney Disease.    Anion gap 13 5 - 15    Comment: Performed at [facility], [place] [address], [place], [place] [address]  CBC with Differential     Status: Abnormal   Collection Time: 12/21/17 12:58 PM  Result Value Ref Range   WBC 19.8 (H) 4.0 - 10.5 K/uL   RBC 4.45 4.22 - 5.81 MIL/uL   Hemoglobin 13.5 13.0 - 17.0 g/dL   HCT 38.4 (L) 39.0 - 52.0 %  MCV 86.3 78.0 - 100.0 fL   MCH 30.3 26.0 - 34.0 pg   MCHC 35.2 30.0 - 36.0 g/dL   RDW 12.8 11.5 - 15.5 %   Platelets 731 (H) 150 - 400 K/uL   Neutrophils Relative % 86 %   Neutro Abs 17.0 (H) 1.7 - 7.7 K/uL   Lymphocytes Relative 7 %   Lymphs Abs 1.4 0.7 - 4.0 K/uL   Monocytes Relative 7 %   Monocytes Absolute 1.3 (H) 0.1 - 1.0 K/uL   Eosinophils Relative 0 %   Eosinophils Absolute 0.1 0.0 - 0.7 K/uL   Basophils Relative 0 %   Basophils Absolute 0.0 0.0 - 0.1 K/uL    Comment: Performed at [facility], [place] [address] [place], [place] [address]  Protime-INR     Status: None   Collection Time: 12/21/17 12:58 PM  Result Value Ref Range   Prothrombin Time 14.0 11.4 - 15.2 seconds   INR 1.09     Comment: Performed at [facility], [place] [address], [place], [place] [address]  Uric acid     Status: Abnormal   Collection Time: 12/21/17 12:58 PM  Result Value Ref Range   Uric Acid, Serum 3.1  (L) 3.7 - 8.6 mg/dL    Comment: Performed at [facility], [place] [address], [place], [place] [address]  Sedimentation rate     Status: Abnormal   Collection Time: 12/21/17 12:58 PM  Result Value Ref Range   Sed Rate 73 (H) 0 - 16 mm/hr    Comment: Performed at [facility], [place] [address], [place], [place] [address]  C-reactive protein     Status: Abnormal   Collection Time: 12/21/17 12:58 PM  Result Value Ref Range   CRP 23.2 (H) <1.0 mg/dL    Comment: Performed at [facility], [place] [address] [place], [place] [address]  Urinalysis, Routine w reflex microscopic     Status: Abnormal   Collection Time: 12/21/17  1:11 PM  Result Value Ref Range   Color, Urine AMBER (A) YELLOW    Comment: BIOCHEMICALS MAY BE AFFECTED BY COLOR   APPearance HAZY (A) CLEAR   Specific Gravity, Urine 1.015 1.005 - 1.030   pH 6.0 5.0 - 8.0   Glucose, UA NEGATIVE NEGATIVE mg/dL   Hgb urine dipstick SMALL (A) NEGATIVE   Bilirubin Urine NEGATIVE NEGATIVE   Ketones, ur NEGATIVE NEGATIVE mg/dL   Protein, ur NEGATIVE NEGATIVE mg/dL   Nitrite NEGATIVE NEGATIVE   Leukocytes, UA NEGATIVE NEGATIVE    Comment: Performed at [facility], [place] [address], [place], [place] [address]  I-Stat CG4 Lactic Acid, ED     Status: None   Collection Time: 12/21/17  1:14 PM  Result Value Ref Range   Lactic Acid, Venous 1.48 0.5 - 1.9 mmol/L  I-Stat CG4 Lactic Acid, ED     Status: None   Collection Time: 12/21/17  4:06 PM  Result Value Ref Range   Lactic Acid, Venous 1.04 0.5 - 1.9 mmol/L  Body fluid culture     Status: None (Preliminary result)   Collection Time: 12/21/17  6:28 PM  Result Value Ref Range   Specimen Description      KNEE LEFT Performed at [place] [address], [place], [place] [address]    Special Requests      Normal Performed at [facility], [place] [address], [place], [place]  [address]  Gram Stain      RARE WBC PRESENT, PREDOMINANTLY PMN NO ORGANISMS SEEN Performed at [place] [address] [place], [place] [address]    Culture PENDING    Report Status PENDING    Dg Ankle Complete Left  Result Date: 12/21/2017 CLINICAL DATA:  Diffuse ankle pain and swelling for the past 3 days. No known injury. EXAM: LEFT ANKLE COMPLETE - 3+ VIEW COMPARISON:  None. FINDINGS: Peripherally corticated ossicles adjacent to the medial malleolus likely represents sequela of remote avulsive injury. No acute fracture or dislocation. Joint spaces appear preserved. The ankle mortise appears preserved. No joint effusion. Tiny plantar calcaneal spur. Minimal enthesopathic change involving the Achilles tendon insertion site. Distal vascular calcifications. Regional soft tissues appear otherwise normal. IMPRESSION: 1. No explanation for patient's diffuse ankle pain and swelling. 2. Peripherally corticated ossicles adjacent to the medial malleolus likely represents sequela of remote avulsive injury. 3. Tiny plantar calcaneal spur. Electronically Signed   By: Sandi Mariscal M.D.   On: 12/21/2017 14:59    Pending Labs Unresulted Labs (From admission, onward)    Start     Ordered   12/21/17 1319  Urine culture  Add-on,   STAT     12/21/17 1318   12/21/17 1256  Culture, blood (Routine x 2)  BLOOD CULTURE X 2,   STAT     12/21/17 1255   Signed and Held  CBC  (enoxaparin (LOVENOX)    CrCl >/= 30 ml/min)  Once,   R    Comments:  Baseline for enoxaparin therapy IF NOT ALREADY DRAWN.  Notify MD if PLT < 100 K.    Signed and Held   Signed and Held  Creatinine, serum  (enoxaparin (LOVENOX)    CrCl >/= 30 ml/min)  Once,   R    Comments:  Baseline for enoxaparin therapy IF NOT ALREADY DRAWN.    Signed and Held   Signed and Held  Creatinine, serum  (enoxaparin (LOVENOX)    CrCl >/= 30 ml/min)  Weekly,   R    Comments:  while on enoxaparin therapy    Signed and Held   Signed and Held   Comprehensive metabolic panel  Tomorrow morning,   R     Signed and Held   Signed and Held  CBC  Tomorrow morning,   R     Signed and Held          Vitals/Pain Today's Vitals   12/21/17 1800 12/21/17 1830 12/21/17 1900 12/21/17 2030  BP: 125/62 (!) 129/58 135/61 138/61  Pulse: 94 95 98 95  Resp: 19 (!) 25 (!) 23 20  Temp:      TempSrc:      SpO2: 94% 95% 94% 95%    Isolation Precautions No active isolations  Medications Medications  morphine 4 MG/ML injection 4 mg (0 mg Intravenous Hold 12/21/17 1424)  lidocaine (PF) (XYLOCAINE) 1 % injection 30 mL (30 mLs Infiltration Refused 12/21/17 1830)  cefTRIAXone (ROCEPHIN) 1 g in sodium chloride 0.9 % 100 mL IVPB (0 g Intravenous Stopped 12/21/17 1828)    Mobility walks

## 2017-12-22 DIAGNOSIS — H409 Unspecified glaucoma: Secondary | ICD-10-CM

## 2017-12-22 DIAGNOSIS — D72829 Elevated white blood cell count, unspecified: Secondary | ICD-10-CM

## 2017-12-22 DIAGNOSIS — L02416 Cutaneous abscess of left lower limb: Secondary | ICD-10-CM

## 2017-12-22 DIAGNOSIS — I10 Essential (primary) hypertension: Secondary | ICD-10-CM

## 2017-12-22 DIAGNOSIS — Z8546 Personal history of malignant neoplasm of prostate: Secondary | ICD-10-CM

## 2017-12-22 DIAGNOSIS — R945 Abnormal results of liver function studies: Secondary | ICD-10-CM

## 2017-12-22 DIAGNOSIS — R7989 Other specified abnormal findings of blood chemistry: Secondary | ICD-10-CM

## 2017-12-22 DIAGNOSIS — I350 Nonrheumatic aortic (valve) stenosis: Secondary | ICD-10-CM

## 2017-12-22 DIAGNOSIS — A419 Sepsis, unspecified organism: Principal | ICD-10-CM

## 2017-12-22 DIAGNOSIS — D649 Anemia, unspecified: Secondary | ICD-10-CM

## 2017-12-22 DIAGNOSIS — L03116 Cellulitis of left lower limb: Secondary | ICD-10-CM

## 2017-12-22 DIAGNOSIS — D473 Essential (hemorrhagic) thrombocythemia: Secondary | ICD-10-CM

## 2017-12-22 DIAGNOSIS — M1990 Unspecified osteoarthritis, unspecified site: Secondary | ICD-10-CM

## 2017-12-22 DIAGNOSIS — D75839 Thrombocytosis, unspecified: Secondary | ICD-10-CM

## 2017-12-22 DIAGNOSIS — E871 Hypo-osmolality and hyponatremia: Secondary | ICD-10-CM

## 2017-12-22 LAB — URINE CULTURE: Culture: NO GROWTH

## 2017-12-22 LAB — CBC
HCT: 32.8 % — ABNORMAL LOW (ref 39.0–52.0)
Hemoglobin: 11.5 g/dL — ABNORMAL LOW (ref 13.0–17.0)
MCH: 30.3 pg (ref 26.0–34.0)
MCHC: 35.1 g/dL (ref 30.0–36.0)
MCV: 86.3 fL (ref 78.0–100.0)
PLATELETS: 645 10*3/uL — AB (ref 150–400)
RBC: 3.8 MIL/uL — AB (ref 4.22–5.81)
RDW: 12.9 % (ref 11.5–15.5)
WBC: 14.4 10*3/uL — ABNORMAL HIGH (ref 4.0–10.5)

## 2017-12-22 LAB — COMPREHENSIVE METABOLIC PANEL
ALK PHOS: 309 U/L — AB (ref 38–126)
ALT: 162 U/L — AB (ref 0–44)
AST: 51 U/L — ABNORMAL HIGH (ref 15–41)
Albumin: 2.6 g/dL — ABNORMAL LOW (ref 3.5–5.0)
Anion gap: 9 (ref 5–15)
BUN: 14 mg/dL (ref 8–23)
CALCIUM: 8.4 mg/dL — AB (ref 8.9–10.3)
CO2: 25 mmol/L (ref 22–32)
CREATININE: 0.48 mg/dL — AB (ref 0.61–1.24)
Chloride: 99 mmol/L (ref 98–111)
GFR calc non Af Amer: >60 mL/min (ref >60)
Glucose, Bld: 100 mg/dL — ABNORMAL HIGH (ref 70–99)
Potassium: 3.8 mmol/L (ref 3.5–5.1)
Sodium: 133 mmol/L — ABNORMAL LOW (ref 135–145)
Total Bilirubin: 1.2 mg/dL (ref 0.3–1.2)
Total Protein: 6 g/dL — ABNORMAL LOW (ref 6.5–8.1)

## 2017-12-22 MED ORDER — VANCOMYCIN HCL IN DEXTROSE 750-5 MG/150ML-% IV SOLN
750.0000 mg | INTRAVENOUS | Status: DC
  Administered 2017-12-22: 750 mg via INTRAVENOUS
  Filled 2017-12-22: qty 150

## 2017-12-22 MED ORDER — VANCOMYCIN HCL IN DEXTROSE 1-5 GM/200ML-% IV SOLN
1000.0000 mg | Freq: Once | INTRAVENOUS | Status: AC
  Administered 2017-12-22: 1000 mg via INTRAVENOUS

## 2017-12-22 NOTE — Clinical Social Work Note (Signed)
Clinical Social Work Assessment  Patient Details  Name: Jeffrey Porter MRN: [id number] Date of Birth: [birth year]  Date of referral:  12/22/17               Reason for consult:  (admitted from facility)                Permission sought to share information with:  Family Supports, Chartered certified accountant granted to share information::  Yes, Verbal Permission Granted  Name::     daughter [name]  Agency::  [facility]  Relationship::     Contact Information:     Housing/Transportation Living arrangements for the past 2 months:  [place] of Information:  Adult Children, Facility Patient Interpreter Needed:  None Criminal Activity/Legal Involvement Pertinent to Current Situation/Hospitalization:  No - Comment as needed Significant Relationships:  Adult Children Lives with:  Self Do you feel safe going back to the place where you live?  Yes Need for family participation in patient care:  Yes (Comment)(daughter involved)  Care giving concerns:  Pt admitted from [facility] where he was at rehab x 6 days PTA. Was DC'd from [facility] to [place] on 12/15/17. Returned for readmission for sepsis, similar to last encounter. Prior to that he was living at home alone and daughter nearby is supportive.   Social Worker assessment / plan:   CSW consulted to assist with disposition as pt admitted from facility. Pt admitted to [facility] 12/15/17 to begin short term rehab. Daughter states he did not make much progress due to feeling sick still and then became readmitted to [facility] last night. She states she wants him to return to [place] to re-initiate rehab goals once stable for DC from this admission. Spoke with facility as well. Pt will need new [employer] Medicare authorization to readmit. Will have facility initiate if SNF determined appropriate once therapy needs are evaluated.  Employment status:  Retired Medical sales representative) PT Recommendations:   (pending) Information / Referral to community resources:     Patient/Family's Response to care:  apprecatiative  Patient/Family's Understanding of and Emotional Response to Diagnosis, Current Treatment, and Prognosis: Did not interact with pt to gauge understanding/emotional state. Daughter however was informed of pt's status and asked pertinent questions related to process of treatment here and DC planning.   Emotional Assessment Appearance:    Attitude/Demeanor/Rapport:    Affect (typically observed):    Orientation:    Alcohol / Substance use:  Not Applicable Psych involvement (Current and /or in the community):  No (Comment)  Discharge Needs  Concerns to be addressed:  Discharge Planning Concerns, Care Coordination Readmission within the last 30 days:  Yes Current discharge risk:  Dependent with Mobility Barriers to Discharge:  Continued Medical Work up, [employer], LCSW 12/22/2017, 10:37 AM  [phone]

## 2017-12-22 NOTE — Progress Notes (Signed)
Pharmacy Antibiotic Note  Jeffrey Porter is a 84 y.o. male admitted on 12/21/2017 with cellulitis.  Pharmacy has been consulted for Vancomycin dosing.  Plan: Vancomycin 1gm iv x1, then 750mg  iv q24hr  Goal AUC = 400 - 500 for all indications, except meningitis (goal AUC > 500 and Cmin 15-20 mcg/mL)   Height: 5\' 2"  (157.5 cm) Weight: 125 lb (56.7 kg) IBW/kg (Calculated) : 54.6  Temp (24hrs), Avg:99.6 F (37.6 C), Min:99 F (37.2 C), Max:100 F (37.8 C)  Recent Labs  Lab 12/21/17 1258 12/21/17 1314 12/21/17 1606 12/22/17 0350  WBC 19.8*  --   --  14.4*  CREATININE 0.54*  --   --  0.48*  LATICACIDVEN  --  1.48 1.04  --     Estimated Creatinine Clearance: 53.1 mL/min (A) (by C-G formula based on SCr of 0.48 mg/dL (L)).    Allergies  Allergen Reactions  . Timolol Maleate Other (See Comments)    Syncope     Antimicrobials this admission: Vancomycin 12/22/2017 >>   Dose adjustments this admission:   Microbiology results:   Thank you for allowing pharmacy to be a part of this patient's care.  Nani Skillern Crowford 12/22/2017 5:23 AM

## 2017-12-22 NOTE — Progress Notes (Signed)
PROGRESS NOTE    Jeffrey Porter  NOT:[id number] DOB: 1932/02/25 DOA: 12/21/2017 PCP: [name], MD   Brief Narrative:  Jeffrey Porter is a 84 y.o. male with medical history significant of hypertension, aortic stenosis, prostate cancer and recent sepsis due to Klebsiella pneumoniae pneumonia who presented to the ER with left ankle pain and swelling. Symptoms had been going on for about 3 days.  It is associated with low-grade temperature 100.9.  Patient was evaluated by his doctor prior to arriving at the assisted living facility.  He was sent over to the emergency room due to worsening symptoms. He was found to be having significant swelling of the left ankle.  Joint aspiration was performed in the ER.  Work-up is ongoing with cultures and crystal studies however due to leukocytosis and patient has morbidity he is being admitted to the hospital for continued treatment.  He denied any injury.  X-ray of the left ankle showed no significant bone involvement. Joint aspiration was performed with body fluid culture as well as crystals culture sent out.  Blood culture was also obtained. He was admitted for Left Ankle Cellulitis and is slowly improving.   Assessment & Plan:   Principal Problem:   Sepsis ([place]) Active Problems:   Leukocytosis   Left ankle swelling   Cellulitis and abscess of left leg   Essential hypertension   History of prostate cancer   Glaucoma   Aortic stenosis   OA (osteoarthritis)   Abnormal LFTs   Thrombocytosis (HCC)   Normocytic anemia   Hyponatremia  Sepsis 2/2 to Left Ankle Cellulitis -Was Based on patient's presentation to the ED. He was tachycardic, tachypenic and had an elevated WBC with a source of infection along with a Temp of 100.9  -Empirically started on vancomycin for possible septic arthritis but likely ruled out by Orthopedics by Joint Aspiration.   -Sepsis Physiology improving -Await cultures of body fluid and Blood Cx.   -Blood Cx x2 ordered and  pending -Await also crystal analysis.   -X-Ray of Left Ankle showed No explanation for patient's diffuse ankle pain and swelling. Peripherally corticated ossicles adjacent to the medial malleolus likely represents sequela of remote avulsive injury. Tiny plantar calcaneal spur. The regional Soft tissues appeared normal -Orthopedics has seen the patient in the ER and did a joint aspiration that showed a small amount of fluid; Orthopedics did not appear to have a septic joint but rather related to cellulitis  -C/w IVF Hydration with NS at 75 mL/hr -PT/OT to evaluate and Treat  Left Ankle Swelling and Inflammation:  -Ruling out infection versus gout.  Await fluid analysis. -CRP was 23.2 and ESR was 73 -Abx as above -Pain control with 7.5 mg po Daily PRN; Given IV Morphine 4 mg once   History of Prostate Cancer -Patient currently stable.   -Follow up with Urology as an outpatient   Hypertension -Blood pressure appears well controlled.   -Continue treatment with Amlodipine 5 mg po Daily.  Leukocytosis -Improving -Secondary to early sepsis from Cellulitis.   -Urinalysis so far negative.   -WBC went from 19.8 -> 14.4 -Continue current treatment with IV Vancomycin  [place] -C/w Latanoprost 1 drop both eyes qHS  Aortic Stenosis and Moderate Mitral Regurgitation -ECHO done 12/07/17 showed Valve mobility was restricted. There was moderate to severe stenosis. There was mild to moderate regurgitation. Valve area (VTI): 0.91 cm^2. Valve area (Vmax): 0.82 cm^2. Valve area (Vmean): 0.78 cm^2. -Follow up with [facility] as an outpatient   OA -  C/w PRN Meloxicam   Abnormal LFTs/Transaminitis -Was present on Last Admission -Last RUQ U/S was unremarkable and Hepatitis Panel was Negative -CT Abd/Pelvis last hospitalization showed Diffuse Hepatic Steatosis  -LFTs trending Down and AST is now 51 and ALT is 162 -Continue to monitor and repeat CMP in AM   Thrombocytosis -Likely  Reactive -Platelet Count last admission started elevating and peaked to 731 -Now trending down and is 645 -Repeat CBC in AM   Normocytic Anemia -Hb/Hct went from 13.5/38.4 -> 11.5/32.8 -Continue to Monitor for S/Sx -Repeat CBC in AM   Hyponatremia -Na+ was 129 on Admission and now improved to 133 -C/w NS at a rate of 75 mL/hr  -Repeat CMP in AM   DVT prophylaxis: Enoxaparin 40 mg sq q24h Code Status: FULL CODE Family Communication: No family present at bedside Disposition Plan:   Consultants:   Orthopedic Surgery Dr. Ninfa Linden   Procedures:    Antimicrobials: Anti-infectives (From admission, onward)   Start     Dose/Rate Route Frequency Ordered Stop   12/22/17 2200  vancomycin (VANCOCIN) IVPB 750 mg/150 ml premix     750 mg 150 mL/hr over 60 Minutes Intravenous Every 24 hours 12/22/17 0522     12/22/17 0030  vancomycin (VANCOCIN) IVPB 1000 mg/200 mL premix     1,000 mg 200 mL/hr over 60 Minutes Intravenous  Once 12/22/17 0020 12/22/17 0139   12/21/17 1730  cefTRIAXone (ROCEPHIN) 1 g in sodium chloride 0.9 % 100 mL IVPB     1 g 200 mL/hr over 30 Minutes Intravenous  Once 12/21/17 1722 12/21/17 1828     Subjective: Seen and examined at bedside and states that his left ankle was feeling much better and was not as painful.  No chest pain, shortness breath or lightheadedness or dizziness.  No other complaints or concerns at this time.  Objective: Vitals:   12/21/17 1900 12/21/17 2030 12/21/17 2124 12/22/17 0429  BP: 135/61 138/61 140/60 (!) 118/59  Pulse: 98 95 96 82  Resp: (!) 23 20 20 20   Temp:   99.4 F (37.4 C) 99.7 F (37.6 C)  TempSrc:   Oral Oral  SpO2: 94% 95% 96% 95%  Weight:   56.7 kg   Height:   5' 2"  (1.575 m)     Intake/Output Summary (Last 24 hours) at 12/22/2017 1204 Last data filed at 12/22/2017 1114 Gross per 24 hour  Intake 1055.69 ml  Output 1050 ml  Net 5.69 ml   Filed Weights   12/21/17 2124  Weight: 56.7 kg   Examination: Physical  Exam:  Constitutional: WN/WD thin elderly male in NAD and appears calm and comfortable Eyes: Lids and conjunctivae normal, sclerae anicteric  ENMT: External Ears, Nose appear normal. Grossly normal hearing. Mucous membranes are moist. Neck: Appears normal, supple, no cervical masses, normal ROM, no appreciable thyromegaly, no JVD Respiratory: Diminished to auscultation bilaterally, no wheezing, rales, rhonchi or crackles. Normal respiratory effort and patient is not tachypenic. No accessory muscle use.  Cardiovascular: RRR, Loud Aortic Stenosis Murmur noted. Trace Left Leg extremity edema.  Abdomen: Soft, non-tender, non-distended. No masses palpated. No appreciable hepatosplenomegaly. Bowel sounds positive x4.  GU: Deferred. Musculoskeletal: No clubbing / cyanosis of digits/nails. Good ROM, no contractures. Normal strength and muscle tone.  Skin: No rashes, lesions. Left ankle swollen but not as erythematous; has some warmth on palpation. No induration; Warm and dry.  Neurologic: CN 2-12 grossly intact with no focal deficits. Strength 5/5 in all 4. Romberg sign and cerebellar reflexes  not assessed.  Psychiatric: Normal judgment and insight. Alert and oriented x 3. Normal mood and appropriate affect.   Data Reviewed: I have personally reviewed following labs and imaging studies  CBC: Recent Labs  Lab 12/21/17 1258 12/22/17 0350  WBC 19.8* 14.4*  NEUTROABS 17.0*  --   HGB 13.5 11.5*  HCT 38.4* 32.8*  MCV 86.3 86.3  PLT 731* 161*   Basic Metabolic Panel: Recent Labs  Lab 12/21/17 1258 12/22/17 0350  NA 129* 133*  K 4.4 3.8  CL 90* 99  CO2 26 25  GLUCOSE 85 100*  BUN 16 14  CREATININE 0.54* 0.48*  CALCIUM 8.9 8.4*   GFR: Estimated Creatinine Clearance: 53.1 mL/min (A) (by C-G formula based on SCr of 0.48 mg/dL (L)). Liver Function Tests: Recent Labs  Lab 12/21/17 1258 12/22/17 0350  AST 77* 51*  ALT 54* 162*  ALKPHOS 425* 309*  BILITOT 2.3* 1.2  PROT 7.7 6.0*   ALBUMIN 3.5 2.6*   No results for input(s): LIPASE, AMYLASE in the last 168 hours. No results for input(s): AMMONIA in the last 168 hours. Coagulation Profile: Recent Labs  Lab 12/21/17 1258  INR 1.09   Cardiac Enzymes: No results for input(s): CKTOTAL, CKMB, CKMBINDEX, TROPONINI in the last 168 hours. BNP (last 3 results) No results for input(s): PROBNP in the last 8760 hours. HbA1C: No results for input(s): HGBA1C in the last 72 hours. CBG: No results for input(s): GLUCAP in the last 168 hours. Lipid Profile: No results for input(s): CHOL, HDL, LDLCALC, TRIG, CHOLHDL, LDLDIRECT in the last 72 hours. Thyroid Function Tests: No results for input(s): TSH, T4TOTAL, FREET4, T3FREE, THYROIDAB in the last 72 hours. Anemia Panel: No results for input(s): VITAMINB12, FOLATE, FERRITIN, TIBC, IRON, RETICCTPCT in the last 72 hours. Sepsis Labs: Recent Labs  Lab 12/21/17 1314 12/21/17 1606  LATICACIDVEN 1.48 1.04    Recent Results (from the past 240 hour(s))  Culture, blood (Routine x 2)     Status: None (Preliminary result)   Collection Time: 12/21/17  1:11 PM  Result Value Ref Range Status   Specimen Description   Final    BLOOD LEFT FOREARM Performed at [facility] [address] [place], [place] [address]    Special Requests   Final    BOTTLES DRAWN AEROBIC AND ANAEROBIC Blood Culture adequate volume Performed at [facility] [address], [place], [place] [address]    Culture   Final    NO GROWTH < 24 HOURS Performed at [facility] [address] [place], [place] [address]    Report Status PENDING  Incomplete  Urine culture     Status: None   Collection Time: 12/21/17  1:19 PM  Result Value Ref Range Status   Specimen Description   Final    URINE, RANDOM Performed at [facility] [address], [place], [place] [address]    Special Requests   Final    NONE Performed at [facility], [place] [address] [place], [place] [address]    Culture   Final    NO GROWTH Performed at [facility], [place] [address], [place], [place] [address]    Report Status 12/22/2017 FINAL  Final  Culture, blood (Routine x 2)     Status: None (Preliminary result)   Collection Time: 12/21/17  1:39 PM  Result Value Ref Range Status   Specimen Description   Final    BLOOD RIGHT  ANTECUBITAL Performed at [facility], [place] [address], [place], [place] [address]    Special Requests   Final    BOTTLES DRAWN AEROBIC AND ANAEROBIC Blood Culture results may not be optimal due to an excessive volume of blood received in culture bottles Performed at [facility] [address], [place], [place] [address]    Culture   Final    NO GROWTH < 24 HOURS Performed at [facility] [address], [place], [place] [address]    Report Status PENDING  Incomplete  Body fluid culture     Status: None (Preliminary result)   Collection Time: 12/21/17  6:28 PM  Result Value Ref Range Status   Specimen Description   Final    KNEE LEFT Performed at [facility] [address], [place], [place] [address]    Special Requests   Final    Normal Performed at [facility], [place] [address] [place], [place] [address]    Gram Stain   Final    RARE WBC PRESENT, PREDOMINANTLY PMN NO ORGANISMS SEEN Performed at [facility], [place] [address], [place], [place] [address]    Culture PENDING  Incomplete   Report Status PENDING  Incomplete    Radiology Studies: Dg Ankle Complete Left  Result Date: 12/21/2017 CLINICAL DATA:  Diffuse ankle pain and swelling for the past 3 days. No known injury. EXAM: LEFT ANKLE COMPLETE - 3+ VIEW COMPARISON:  None. FINDINGS: Peripherally corticated ossicles adjacent to the medial malleolus likely represents sequela of remote avulsive injury. No acute fracture or dislocation. Joint spaces appear  preserved. The ankle mortise appears preserved. No joint effusion. Tiny plantar calcaneal spur. Minimal enthesopathic change involving the Achilles tendon insertion site. Distal vascular calcifications. Regional soft tissues appear otherwise normal. IMPRESSION: 1. No explanation for patient's diffuse ankle pain and swelling. 2. Peripherally corticated ossicles adjacent to the medial malleolus likely represents sequela of remote avulsive injury. 3. Tiny plantar calcaneal spur. Electronically Signed   By: Sandi Mariscal M.D.   On: 12/21/2017 14:59   Scheduled Meds: . amLODipine  5 mg Oral Daily  . carbidopa-levodopa  1 tablet Oral Daily  . cholecalciferol  2,000 Units Oral Daily  . enoxaparin (LOVENOX) injection  40 mg Subcutaneous Q24H  . latanoprost  1 drop Both Eyes QHS  . lidocaine (PF)  30 mL Infiltration Once  . Magnesium  200 mg Oral Daily  .  morphine injection  4 mg Intravenous Once  . multivitamin with minerals  1 tablet Oral Daily  . multivitamin-lutein  1 capsule Oral Daily  . niacin  100 mg Oral QHS   Continuous Infusions: . sodium chloride 75 mL/hr at 12/22/17 0300  . vancomycin      LOS: 1 day   Kerney Elbe, DO [facility] PAGER is on [place]  If 7PM-7AM, please contact night-coverage [email or web address] Password [facility] 12/22/2017, 12:04 PM

## 2017-12-23 LAB — COMPREHENSIVE METABOLIC PANEL
ALBUMIN: 2.7 g/dL — AB (ref 3.5–5.0)
ALT: 152 U/L — AB (ref 0–44)
AST: 61 U/L — AB (ref 15–41)
Alkaline Phosphatase: 283 U/L — ABNORMAL HIGH (ref 38–126)
Anion gap: 10 (ref 5–15)
BILIRUBIN TOTAL: 1.3 mg/dL — AB (ref 0.3–1.2)
BUN: 12 mg/dL (ref 8–23)
CALCIUM: 8.7 mg/dL — AB (ref 8.9–10.3)
CHLORIDE: 101 mmol/L (ref 98–111)
CO2: 24 mmol/L (ref 22–32)
CREATININE: 0.54 mg/dL — AB (ref 0.61–1.24)
GFR calc Af Amer: >60 mL/min (ref >60)
GLUCOSE: 111 mg/dL — AB (ref 70–99)
Potassium: 3.7 mmol/L (ref 3.5–5.1)
Sodium: 135 mmol/L (ref 135–145)
TOTAL PROTEIN: 6 g/dL — AB (ref 6.5–8.1)

## 2017-12-23 LAB — CBC WITH DIFFERENTIAL/PLATELET
BASOS ABS: 0 10*3/uL (ref 0.0–0.1)
Basophils Relative: 0 %
Eosinophils Absolute: 0.1 10*3/uL (ref 0.0–0.7)
Eosinophils Relative: 1 %
HEMATOCRIT: 32.7 % — AB (ref 39.0–52.0)
Hemoglobin: 11.4 g/dL — ABNORMAL LOW (ref 13.0–17.0)
LYMPHS PCT: 11 %
Lymphs Abs: 1.6 10*3/uL (ref 0.7–4.0)
MCH: 30.1 pg (ref 26.0–34.0)
MCHC: 34.9 g/dL (ref 30.0–36.0)
MCV: 86.3 fL (ref 78.0–100.0)
Monocytes Absolute: 1.1 10*3/uL — ABNORMAL HIGH (ref 0.1–1.0)
Monocytes Relative: 8 %
NEUTROS ABS: 11.2 10*3/uL — AB (ref 1.7–7.7)
Neutrophils Relative %: 80 %
PLATELETS: 654 10*3/uL — AB (ref 150–400)
RBC: 3.79 MIL/uL — AB (ref 4.22–5.81)
RDW: 12.7 % (ref 11.5–15.5)
WBC: 13.9 10*3/uL — AB (ref 4.0–10.5)

## 2017-12-23 LAB — PHOSPHORUS: PHOSPHORUS: 3.5 mg/dL (ref 2.5–4.6)

## 2017-12-23 LAB — MAGNESIUM: Magnesium: 2 mg/dL (ref 1.7–2.4)

## 2017-12-23 MED ORDER — LIP MEDEX EX OINT
TOPICAL_OINTMENT | CUTANEOUS | Status: AC
  Filled 2017-12-23: qty 7

## 2017-12-23 MED ORDER — AMOXICILLIN-POT CLAVULANATE 875-125 MG PO TABS: 1.0000 | ORAL_TABLET | Freq: Two times a day (BID) | ORAL | 0 refills | Status: AC

## 2017-12-23 MED ORDER — AMOXICILLIN-POT CLAVULANATE 875-125 MG PO TABS
1.0000 | ORAL_TABLET | Freq: Two times a day (BID) | ORAL | Status: DC
  Administered 2017-12-23: 1 via ORAL
  Filled 2017-12-23 (×2): qty 1

## 2017-12-23 NOTE — Progress Notes (Signed)
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  Per chart, pt was D/C'd without social work involvement and that the last social work was a note stating pt would need an insurance auth prior to pt's D/C to [facility], if an insurance auth was approved in that case.   CSW will continue to follow for D/C needs.  Alphonse Guild. Camary Sosa, LCSW, LCAS, CSI Clinical Social Worker Ph: [phone]

## 2017-12-23 NOTE — Evaluation (Signed)
Physical Therapy Evaluation Patient Details Name: Jeffrey Porter MRN: [id number] DOB: 1932-03-28 Today's Date: 12/23/2017   History of Present Illness  Pt was admitted with sepsis; recently admitted with the same and was at  [facility].  L ankle aspirated this admission.  Has cellulitis.  Had uti last admission  Clinical Impression   Pt with admission and PMH as listed above. Pt presents with decreased activity tolerance, festinating and freezing of gait, trunk rigidity with ambulation, L foot and achilles pain, and decreased balance. Pt would benefit from PT to address these deficits. Pt ambulated 92 ft with RW with min assist, and was very slow due to festinating and freezing of gait. PT highly suggests return to neurologist for gait deficits. Will continue to follow acutely. D/c plan is SNF for rehab.     Follow Up Recommendations SNF;Supervision for mobility/OOB    Equipment Recommendations  None recommended by PT    Recommendations for Other Services       Precautions / Restrictions Precautions Precautions: Fall Restrictions Weight Bearing Restrictions: No      Mobility  Bed Mobility Overal bed mobility: Needs Assistance Bed Mobility: Supine to Sit;Sit to Supine     Supine to sit: Min guard Sit to supine: Mod assist   General bed mobility comments: Min guard for supine to sit for safety, use of handrails. Very increased time and effort to perform. Mod assist for sit to supine for LE management.   Transfers Overall transfer level: Needs assistance Equipment used: Rolling walker (2 wheeled) Transfers: Sit to/from Stand Sit to Stand: Min assist         General transfer comment: Min assist for power up and steadying upon standing. Increased time to perform.   Ambulation/Gait Ambulation/Gait assistance: Min assist Gait Distance (Feet): 70 Feet Assistive device: Rolling walker (2 wheeled) Gait Pattern/deviations: Step-to pattern;Step-through pattern;Decreased stride  length;Festinating;Shuffle Gait velocity: very decreased, due to festinating and freezing   General Gait Details: Pt with festinating gait, alternating festination with step-through pattern with verbal cuing of "big steps". Freezing of gait, seen especially with making turns, changing surfaces/colors of floor.   Stairs            Wheelchair Mobility    Modified Rankin (Stroke Patients Only)       Balance Overall balance assessment: History of Falls;Needs assistance Sitting-balance support: Feet supported;No upper extremity supported Sitting balance-Leahy Scale: Fair     Standing balance support: Bilateral upper extremity supported;During functional activity Standing balance-Leahy Scale: Poor Standing balance comment: relies on RW for UE support, forward flexed trunk and kyphotic posture.                              Pertinent Vitals/Pain Pain Assessment: Faces Faces Pain Scale: Hurts a little bit Pain Location: L ankle  Pain Descriptors / Indicators: Sore Pain Intervention(s): Limited activity within patient's tolerance;Monitored during session;Repositioned    Home Living Family/patient expects to be discharged to:: Skilled nursing facility Living Arrangements: Alone             Home Equipment: Kasandra Knudsen - single point;Shower seat;Walker - 2 wheels Additional Comments: was at [employer].      Prior Function Level of Independence: Needs assistance   Gait / Transfers Assistance Needed: used RW prior to admission   ADL's / Homemaking Assistance Needed: w/c to get to bathroom         Hand Dominance  Extremity/Trunk Assessment   Upper Extremity Assessment Upper Extremity Assessment: Overall WFL for tasks assessed    Lower Extremity Assessment Lower Extremity Assessment: Generalized weakness(more proximal weakness, difficulty with sit to stand )    Cervical / Trunk Assessment Cervical / Trunk Assessment: Kyphotic  Communication    Communication: No difficulties  Cognition Arousal/Alertness: Awake/alert Behavior During Therapy: WFL for tasks assessed/performed Overall Cognitive Status: Within Functional Limits for tasks assessed                                        General Comments      Exercises Other Exercises Other Exercises: L calf stretch x15 seconds for calf tenderness   Assessment/Plan    PT Assessment Patient needs continued PT services  PT Problem List Decreased strength;Decreased range of motion;Pain;Decreased activity tolerance;Decreased balance;Decreased mobility;Decreased knowledge of use of DME       PT Treatment Interventions DME instruction;Gait training;Therapeutic activities;Therapeutic exercise;Patient/family education;Balance training;Functional mobility training;Neuromuscular re-education    PT Goals (Current goals can be found in the Care Plan section)  Acute Rehab PT Goals PT Goal Formulation: With patient Time For Goal Achievement: 01/06/18 Potential to Achieve Goals: Good    Frequency Min 2X/week   Barriers to discharge        Co-evaluation               AM-PAC PT "6 Clicks" Daily Activity  Outcome Measure Difficulty turning over in bed (including adjusting bedclothes, sheets and blankets)?: Unable Difficulty moving from lying on back to sitting on the side of the bed? : Unable Difficulty sitting down on and standing up from a chair with arms (e.g., wheelchair, bedside commode, etc,.)?: Unable Help needed moving to and from a bed to chair (including a wheelchair)?: A Little Help needed walking in hospital room?: A Little Help needed climbing 3-5 steps with a railing? : A Lot 6 Click Score: 11    End of Session Equipment Utilized During Treatment: Gait belt Activity Tolerance: Patient tolerated treatment well Patient left: in bed;with call bell/phone within reach;with bed alarm set Nurse Communication: (RN not at nursing station or around pt  room) PT Visit Diagnosis: Unsteadiness on feet (R26.81);History of falling (Z91.81)    Time: 9150-5697 PT Time Calculation (min) (ACUTE ONLY): 38 min   Charges:   PT Evaluation $PT Eval Low Complexity: 1 Low PT Treatments $Gait Training: 8-22 mins $Therapeutic Activity: 8-22 mins        Jamilah Jean Conception Chancy, PT, DPT  Pager # [phone]   Shamal Stracener D Lindie Roberson 12/23/2017, 1:26 PM

## 2017-12-23 NOTE — Discharge Summary (Addendum)
Physician Discharge Summary  Jeffrey Porter JKK:[id number] DOB: Jan 19, 1932 DOA: 12/21/2017  PCP: [name], MD  Admit date: 12/21/2017 Discharge date: 12/23/2017  Admitted From: SNF Disposition: SNF  Recommendations for Outpatient Follow-up:  1. Follow up with PCP in 1-2 weeks 2. Follow up with Cardiology for Aortic Stenosis  3. Please obtain CMP/CBC, Mag, Phos in one week 4. Please follow up on the following pending results:  Home Health: No Equipment/Devices: 3 in 1 Bedside   Discharge Condition: Stable CODE STATUS: FULL CODE Diet recommendation: Heart Healthy Diet   Brief/Interim Summary: Jeffrey Munozis a 84 y.o.malewith medical history significant ofhypertension, aortic stenosis, prostate cancer and recent sepsis due to Klebsiella pneumoniae pneumonia who presented to the ER with left ankle pain and swelling.Symptoms had been going on for about 3 days. It is associated with low-grade temperature 100.9. Patient was evaluated by his doctor prior to arriving at the assisted living facility. He was sent over to the emergency room due to worsening symptoms. He was found to be having significant swelling of the left ankle. Joint aspiration was performed in the ER. Work-up is ongoing with cultures and crystal studies however due to leukocytosis and patient has morbidity he is being admitted to the hospital for continued treatment. He denied any injury.  X-ray of the left ankle showed no significant bone involvement. Joint aspiration was performed with body fluid culture as well as crystals culture sent out. Blood culture was also obtained. He was admitted for Left Ankle Cellulitis improved and IV vancomycin was switched to p.o. Augmentin.  His leg swelling and erythema had significantly improved and patient had more motion in his ankle.  His white blood cell count started trending down and he felt well.  Patient was deemed medically stable to be discharged back to skilled nursing  facility where he will continue his antibiotic treatment p.o. He will need to follow-up with primary care physician and Cardiology for further evaluation of his Aortic Stenosis.  Discharge Diagnoses:  Principal Problem:   Sepsis ([place]) Active Problems:   Leukocytosis   Left ankle swelling   Cellulitis and abscess of left leg   Essential hypertension   History of prostate cancer   Glaucoma   Aortic stenosis   OA (osteoarthritis)   Abnormal LFTs   Thrombocytosis ([facility])   Normocytic anemia   Hyponatremia  Sepsis 2/2 to Left Ankle Cellulitis, improved -Based on patient's presentation to the ED. He was tachycardic, tachypenic and had an elevated WBC with a source of infection along with a Temp of 100.9 -Empirically started on vancomycin for possible septic arthritis but likely ruled out by Orthopedics by Joint Aspiration. IV Vancomycin changed to po Augmentin  -Sepsis Physiology improved -Await cultures of body fluid and Blood Cx. Body Fluid Cx Negative at 2 days  -Blood Cx x2 ordered and showed NGTD <24 hours  -Await also crystal analysis but unclear if sent.  -X-Ray of Left Ankle showed No explanation for patient's diffuse ankle pain and swelling. Peripherally corticated ossicles adjacent to the medial malleolus likely represents sequela of remote avulsive injury. Tiny plantar calcaneal spur. The regional Soft tissues appeared normal -Orthopedics has seen the patient in the ER and did a joint aspiration that showed a small amount of fluid; Orthopedics did not appear to have a septic joint but rather related to cellulitis; Unclear if Fluid Analysis for Crystals sent  -C/w IVF Hydration with NS at 75 mL/hr -PT/OT to evaluate and Treat and recommending SNF -Follow up with PCP at  SNF and have close monitoring of Cellulitis   Left Ankle Swelling and Inflammation -Ruling out infection versus gout. Await fluid analysis but Fluid Cx showed No Growth at 2 days. Unclear if fluid was sent for  Crystal Analysis but swelling and erythema improved  -CRP was 23.2 and ESR was 73 -Abx as above -Pain control with 7.5 mg po Daily PRN; Given IV Morphine 4 mg once during hospitalization  History of Prostate Cancer -Patient currently stable.  -Follow up with Urology as an outpatient   Hypertension -Blood pressure appears well controlled.  -Continue treatment with Amlodipine 5 mg po Daily. -BP at goal at 135/53  Leukocytosis -Improving -Secondary to early sepsis from Cellulitis.  -Urinalysis so far negative.  -WBC went from 19.8 -> 14.4 -> 13.9 -Continue current treatment with IV Vancomycin  [place] -C/w Latanoprost 1 drop both eyes qHS -C/w Lutein-Zeaxanthin po 1 tab Daily  Aortic Stenosis and Moderate Mitral Regurgitation -ECHO done 12/07/17 showed Valve mobility was restricted. There was moderate to severe stenosis. There was mild to moderate regurgitation. Valve area (VTI): 0.91 cm^2. Valve area (Vmax): 0.82 cm^2. Valve area (Vmean): 0.78 cm^2. -Follow up with [facility] as an outpatient but patient states he is not interested in having Surgery but will go for evaluation   OA -C/w PRN Meloxicam   Abnormal LFTs/Transaminitis -Was present on Last Admission -Last RUQ U/S was unremarkable and Hepatitis Panel was Negative -CT Abd/Pelvis last hospitalization showed Diffuse Hepatic Steatosis  -LFTs trending Down and AST is now 61 and ALT is 152 -Continue to monitor and repeat CMP as an outpatient    Thrombocytosis -Likely Reactive -Platelet Count last admission started elevating and peaked to 731 -Now trending down and is 6454 -Repeat CBC as an outpatient   Normocytic Anemia -Hb/Hct went from 13.5/38.4 -> 11.5/32.8 -> 11.4/32.7 -Continue to Monitor for S/Sx -Repeat CBC as an outpatient   Hyponatremia -Na+ was 129 on Admission and now improved to 135 -C/w NS at a rate of 75 mL/hr  While hospitalized  -Repeat CMP as an outpatient    ?Parkinsons/Parkinsonian Features -Seen by Neurology who does not feel he has Parkinson's but Physician at [facility] feels he may have some -Will Stop Sinemet IR 25-100 mg po daily as he was only supposed to have it for 2 days per [facility]  Discharge Instructions Discharge Instructions    Call MD for:  difficulty breathing, headache or visual disturbances   Complete by:  As directed    Call MD for:  extreme fatigue   Complete by:  As directed    Call MD for:  hives   Complete by:  As directed    Call MD for:  persistant dizziness or light-headedness   Complete by:  As directed    Call MD for:  persistant nausea and vomiting   Complete by:  As directed    Call MD for:  redness, tenderness, or signs of infection (pain, swelling, redness, odor or green/yellow discharge around incision site)   Complete by:  As directed    Call MD for:  severe uncontrolled pain   Complete by:  As directed    Call MD for:  temperature >100.4   Complete by:  As directed    Diet - low sodium heart healthy   Complete by:  As directed    Discharge instructions   Complete by:  As directed    You were cared for by a hospitalist during your hospital stay. If you have any  questions about your discharge medications or the care you received while you were in the hospital after you are discharged, you can call the unit and ask to speak with the hospitalist on call if the hospitalist that took care of you is not available. Once you are discharged, your primary care physician will handle any further medical issues. Please note that NO REFILLS for any discharge medications will be authorized once you are discharged, as it is imperative that you return to your primary care physician (or establish a relationship with a primary care physician if you do not have one) for your aftercare needs so that they can reassess your need for medications and monitor your lab values.  Follow up with PCP and Cardiology in the outpatient  setting. Take all medications as prescribed. If symptoms change or worsen, please return to the ED for evaluation   Increase activity slowly   Complete by:  As directed      Allergies as of 12/23/2017      Reactions   Timolol Maleate Other (See Comments)   Syncope      Medication List    STOP taking these medications   carbidopa-levodopa 25-100 MG tablet Commonly known as:  SINEMET IR   cefTRIAXone  IVPB Commonly known as:  ROCEPHIN     TAKE these medications   acetaminophen 500 MG tablet Commonly known as:  TYLENOL Take 500 mg by mouth daily as needed for moderate pain.   amLODipine 5 MG tablet Commonly known as:  NORVASC Take 1 tablet (5 mg total) by mouth daily.   amoxicillin-clavulanate 875-125 MG tablet Commonly known as:  AUGMENTIN Take 1 tablet by mouth every 12 (twelve) hours for 8 days.   latanoprost 0.005 % ophthalmic solution Commonly known as:  XALATAN Place 1 drop into both eyes at bedtime.   LUTEIN-ZEAXANTHIN PO Take 1 tablet by mouth daily.   Magnesium 250 MG Tabs Take 250 mg by mouth daily.   meloxicam 7.5 MG tablet Commonly known as:  MOBIC Take 7.5 mg by mouth daily as needed for pain.   multivitamin with minerals Tabs tablet Take 1 tablet by mouth daily.   niacin 100 MG tablet Take 100 mg by mouth at bedtime.   Vitamin D 2000 units tablet Take 2,000 Units by mouth daily.      Follow-up Information    [name], MD Follow up.   Specialty:  Family Medicine Contact information: [place] [address] [phone]          Allergies  Allergen Reactions  . Timolol Maleate Other (See Comments)    Syncope    Consultations:  Orthopedic Surgery  Procedures/Studies: Dg Chest 2 View  Result Date: 12/06/2017 CLINICAL DATA:  [place] this morning. Weakness and fever. Leukocytosis. History of prostate cancer. EXAM: CHEST - 2 VIEW COMPARISON:  None. FINDINGS: Cardiomediastinal silhouette is normal. Calcified  aortic arch. No pleural effusions or focal consolidations. Trachea projects midline and there is no pneumothorax. Soft tissue planes and included osseous structures are non-suspicious. Mild degenerative change of the thoracic spine. Asymmetrically prominent RIGHT chest wall soft tissue may be positional, recommend correlation with physical examination. IMPRESSION: 1. No acute cardiopulmonary process. 2.  Aortic Atherosclerosis (ICD10-I70.0). Electronically Signed   By: Elon Alas M.D.   On: 12/06/2017 22:12   Dg Ankle Complete Left  Result Date: 12/21/2017 CLINICAL DATA:  Diffuse ankle pain and swelling for the past 3 days. No known injury. EXAM: LEFT ANKLE COMPLETE -  3+ VIEW COMPARISON:  None. FINDINGS: Peripherally corticated ossicles adjacent to the medial malleolus likely represents sequela of remote avulsive injury. No acute fracture or dislocation. Joint spaces appear preserved. The ankle mortise appears preserved. No joint effusion. Tiny plantar calcaneal spur. Minimal enthesopathic change involving the Achilles tendon insertion site. Distal vascular calcifications. Regional soft tissues appear otherwise normal. IMPRESSION: 1. No explanation for patient's diffuse ankle pain and swelling. 2. Peripherally corticated ossicles adjacent to the medial malleolus likely represents sequela of remote avulsive injury. 3. Tiny plantar calcaneal spur. Electronically Signed   By: Sandi Mariscal M.D.   On: 12/21/2017 14:59   Ct Angio Chest Pe W Or Wo Contrast  Result Date: 12/07/2017 CLINICAL DATA:  Fall, down for 12 hours. Fever and back pain. Leukocytosis. History of prostate cancer. EXAM: CT ANGIOGRAPHY CHEST WITH CONTRAST TECHNIQUE: Multidetector CT imaging of the chest was performed using the standard protocol during bolus administration of intravenous contrast. Multiplanar CT image reconstructions and MIPs were obtained to evaluate the vascular anatomy. CONTRAST:  158m ISOVUE-370 IOPAMIDOL (ISOVUE-370)  INJECTION 76% COMPARISON:  Chest radiograph December 06, 2017 FINDINGS: CARDIOVASCULAR: Adequate contrast opacification of the pulmonary artery's. Main pulmonary artery is not enlarged. No pulmonary arterial filling defects to the level of the subsegmental branches. Heart size is normal. Mild coronary artery calcification. No pericardial effusion. Thoracic aorta is normal course and caliber, moderate calcific atherosclerosis and intimal thickening. MEDIASTINUM/NODES: No lymphadenopathy by CT size criteria. LUNGS/PLEURA: Tracheobronchial tree is patent, no pneumothorax. No pleural effusions, focal consolidations, pulmonary nodules or masses. Dependent atelectasis. UPPER ABDOMEN: Nonacute. Subcentimeter hypodensity in segment 4, potential cyst. MUSCULOSKELETAL: Nonacute. Mild degenerative change of the thoracic spine. Review of the MIP images confirms the above findings. IMPRESSION: 1. No acute pulmonary embolism nor acute cardiopulmonary process. Aortic Atherosclerosis (ICD10-I70.0). Electronically Signed   By: CElon AlasM.D.   On: 12/07/2017 04:53   Ct Abdomen Pelvis W Contrast  Result Date: 12/08/2017 CLINICAL DATA:  Abdominal pain yesterday. Sepsis. Leukocytosis. EXAM: CT ABDOMEN AND PELVIS WITH CONTRAST TECHNIQUE: Multidetector CT imaging of the abdomen and pelvis was performed using the standard protocol following bolus administration of intravenous contrast. CONTRAST:  1086mOMNIPAQUE IOHEXOL 300 MG/ML SOLN, 3035mSOVUE-300 IOPAMIDOL (ISOVUE-300) INJECTION 61% COMPARISON:  Abdomen ultrasound and chest CT obtained yesterday. FINDINGS: Lower chest: Interval minimal bilateral pleural fluid and areas of dependent consolidation in both lower lobes and lingula. Hepatobiliary: Diffuse low density of the liver relative to the spleen. Normal appearing gallbladder. Pancreas: Unremarkable. No pancreatic ductal dilatation or surrounding inflammatory changes. Spleen: Normal in size without focal abnormality.  Adrenals/Urinary Tract: Foley catheter in the urinary bladder with associated air in the bladder. Normal appearing adrenal glands, a ureters and left kidney. Tiny area of low density in the anterolateral aspect of the peripheral cortex of the mid to lower right kidney. This is too small to characterize. Stomach/Bowel: Small hiatal hernia. Mildly prominent gas-filled sigmoid colon in the left mid upper abdomen. Unremarkable small bowel. No evidence of appendicitis. Vascular/Lymphatic: Atheromatous arterial calcifications without aneurysm. No enlarged lymph nodes. Reproductive: Prostate radiation seed implants. Other: Left groin surgical clips and left anterior pelvic wall hernia repair mesh. Small amount of free peritoneal fluid in the inferior pelvis. Musculoskeletal: Lumbar and lower thoracic spine degenerative changes. IMPRESSION: 1. Interval minimal bilateral pleural fluid and areas of dependent consolidation in both lower lobes and lingula. This could be due to pneumonia or atelectasis. 2. Small amount of free peritoneal fluid in the inferior pelvis. 3. Diffuse hepatic  steatosis. 4. Tiny area of low density in the right kidney laterally, too small to characterize. Electronically Signed   By: Claudie Revering M.D.   On: 12/08/2017 15:55   Dg Chest Port 1 View  Result Date: 12/08/2017 CLINICAL DATA:  Respiratory distress. EXAM: PORTABLE CHEST 1 VIEW COMPARISON:  Chest radiograph December 06, 2017 and chest CT December 07, 2017 FINDINGS: New interstitial and LEFT greater than RIGHT alveolar airspace opacities. No pleural effusion. Cardiac silhouette is normal. Calcified aortic arch. No pneumothorax. Soft tissue planes and included osseous structures are non suspicious. IMPRESSION: New interstitial and alveolar airspace opacities most compatible with bronchopneumonia. Aortic Atherosclerosis (ICD10-I70.0). Electronically Signed   By: Elon Alas M.D.   On: 12/08/2017 05:25   US Abdomen Limited Ruq  Result Date:  12/07/2017 CLINICAL DATA:  Abnormal liver enzymes EXAM: ULTRASOUND ABDOMEN LIMITED RIGHT UPPER QUADRANT COMPARISON:  None. FINDINGS: Gallbladder: No gallstones or wall thickening visualized. There is no pericholecystic fluid. No sonographic Murphy sign noted by sonographer. Common bile duct: Diameter: 6 mm. No intrahepatic or extrahepatic biliary duct dilatation. Liver: No focal lesion identified. Within normal limits in parenchymal echogenicity. Portal vein is patent on color Doppler imaging with normal direction of blood flow towards the liver. IMPRESSION: Study within normal limits. Electronically Signed   By: Lowella Grip III M.D.   On: 12/07/2017 10:10   Subjective: Seen and examined at bedside and he states that he was feeling better.  Denies any chest pain, shortness breath, nausea, vomiting.  States ankle has more motion and states he is feels it is not as sore or swollen.  Denies any other complaints or concerns and is ready to go back to skilled nursing facility.  Discharge Exam: Vitals:   12/23/17 0611 12/23/17 0944  BP: (!) 110/47 (!) 135/53  Pulse: 83 90  Resp: 14   Temp: 99.6 F (37.6 C) 98.2 F (36.8 C)  SpO2: 96% 97%   Vitals:   12/22/17 1238 12/22/17 2114 12/23/17 0611 12/23/17 0944  BP: (!) 124/55 129/60 (!) 110/47 (!) 135/53  Pulse: 91 96 83 90  Resp: _0 Temp: 99 F (37.2 C) 99.6 F (37.6 C) 99.6 F (37.6 C) 98.2 F (36.8 C)  TempSrc: Oral Oral Oral Oral  SpO2: 96% 98% 96% 97%  Weight:      Height:       General: Pt is alert, awake, not in acute distress Cardiovascular: RRR, S1/S2 +, no rubs, no gallops; Loud Systolic Murmur appreciated  Respiratory: Diminished bilaterally, no wheezing, no rhonchi Abdominal: Soft, NT, ND, bowel sounds + Extremities: Mild LE edema worse on left, no cyanosis Left ankle is not as swollen or erythematous   The results of significant diagnostics from this hospitalization (including imaging, microbiology, ancillary and  laboratory) are listed below for reference.    Microbiology: Recent Results (from the past 240 hour(s))  Culture, blood (Routine x 2)     Status: None (Preliminary result)   Collection Time: 12/21/17  1:11 PM  Result Value Ref Range Status   Specimen Description   Final    BLOOD LEFT FOREARM Performed at [facility], [place] [address], [place], [place] [address]    Special Requests   Final    BOTTLES DRAWN AEROBIC AND ANAEROBIC Blood Culture adequate volume Performed at [facility] [address], [place], [place] [address]    Culture   Final    NO GROWTH < 24 HOURS Performed at [facility], [place] [address] [place], [place] [address]    Report Status PENDING  Incomplete  Urine culture     Status: None   Collection Time: 12/21/17  1:19 PM  Result Value Ref Range Status   Specimen Description   Final    URINE, RANDOM Performed at [facility] [address] [place], [place] [address]    Special Requests   Final    NONE Performed at [facility], [place] [address] [place], [place] [address]    Culture   Final    NO GROWTH Performed at [facility], [place] [address] [place], [place] [address]    Report Status 12/22/2017 FINAL  Final  Culture, blood (Routine x 2)     Status: None (Preliminary result)   Collection Time: 12/21/17  1:39 PM  Result Value Ref Range Status   Specimen Description   Final    BLOOD RIGHT ANTECUBITAL Performed at [facility] [address] [place], [place] [address]    Special Requests   Final    BOTTLES DRAWN AEROBIC AND ANAEROBIC Blood Culture results may not be optimal due to an excessive volume of blood received in culture bottles Performed at [facility] [address] [place], [place] [address]    Culture   Final    NO GROWTH < 24 HOURS Performed at [facility] [address], [place], [place] [address]     Report Status PENDING  Incomplete  Body fluid culture     Status: None (Preliminary result)   Collection Time: 12/21/17  6:28 PM  Result Value Ref Range Status   Specimen Description   Final    KNEE LEFT Performed at [facility] [address] [place], [place] [address]    Special Requests   Final    Normal Performed at [facility], [place] [address], [place], [place] [address]    Gram Stain   Final    RARE WBC PRESENT, PREDOMINANTLY PMN NO ORGANISMS SEEN    Culture   Final    NO GROWTH 2 DAYS Performed at [facility] [address] [place], [place] [address]    Report Status PENDING  Incomplete    Labs: BNP (last 3 results) Recent Labs    12/08/17 0548  BNP 830.9*   Basic Metabolic Panel: Recent Labs  Lab 12/21/17 1258 12/22/17 0350 12/23/17 0358  NA 129* 133* 135  K 4.4 3.8 3.7  CL 90* 99 101  CO2 _0 GLUCOSE 85 100* 111*  BUN _1 CREATININE 0.54* 0.48* 0.54*  CALCIUM 8.9 8.4* 8.7*  MG  --   --  2.0  PHOS  --   --  3.5   Liver Function Tests: Recent Labs  Lab 12/21/17 1258 12/22/17 0350 12/23/17 0358  AST 77* 51* 61*  ALT 54* 162* 152*  ALKPHOS 425* 309* 283*  BILITOT 2.3* 1.2 1.3*  PROT 7.7 6.0* 6.0*  ALBUMIN 3.5 2.6* 2.7*   No results for input(s): LIPASE, AMYLASE in the last 168 hours. No results for input(s): AMMONIA in the last 168 hours. CBC: Recent Labs  Lab 12/21/17 1258 12/22/17 0350 12/23/17 0358  WBC 19.8* 14.4* 13.9*  NEUTROABS 17.0*  --  11.2*  HGB 13.5 11.5* 11.4*  HCT 38.4* 32.8* 32.7*  MCV 86.3 86.3 86.3  PLT 731* 645* 654*   Cardiac Enzymes: No  results for input(s): CKTOTAL, CKMB, CKMBINDEX, TROPONINI in the last 168 hours. BNP: Invalid input(s): POCBNP CBG: No results for input(s): GLUCAP in the last 168 hours. D-Dimer No results for input(s): DDIMER in the last 72 hours. Hgb A1c No results for input(s): HGBA1C in the last 72 hours. Lipid Profile No  results for input(s): CHOL, HDL, LDLCALC, TRIG, CHOLHDL, LDLDIRECT in the last 72 hours. Thyroid function studies No results for input(s): TSH, T4TOTAL, T3FREE, THYROIDAB in the last 72 hours.  Invalid input(s): FREET3 Anemia work up No results for input(s): VITAMINB12, FOLATE, FERRITIN, TIBC, IRON, RETICCTPCT in the last 72 hours. Urinalysis    Component Value Date/Time   COLORURINE AMBER (A) 12/21/2017 1311   APPEARANCEUR HAZY (A) 12/21/2017 1311   LABSPEC 1.015 12/21/2017 1311   PHURINE 6.0 12/21/2017 1311   GLUCOSEU NEGATIVE 12/21/2017 1311   HGBUR SMALL (A) 12/21/2017 1311   BILIRUBINUR NEGATIVE 12/21/2017 1311   KETONESUR NEGATIVE 12/21/2017 1311   PROTEINUR NEGATIVE 12/21/2017 1311   NITRITE NEGATIVE 12/21/2017 1311   LEUKOCYTESUR NEGATIVE 12/21/2017 1311   Sepsis Labs Invalid input(s): PROCALCITONIN,  WBC,  LACTICIDVEN Microbiology Recent Results (from the past 240 hour(s))  Culture, blood (Routine x 2)     Status: None (Preliminary result)   Collection Time: 12/21/17  1:11 PM  Result Value Ref Range Status   Specimen Description   Final    BLOOD LEFT FOREARM Performed at [facility], [place] [address] [place], [place] [address]    Special Requests   Final    BOTTLES DRAWN AEROBIC AND ANAEROBIC Blood Culture adequate volume Performed at [facility] [address] [place], [place] [address]    Culture   Final    NO GROWTH < 24 HOURS Performed at [facility] [address] [place], [place] [address]    Report Status PENDING  Incomplete  Urine culture     Status: None   Collection Time: 12/21/17  1:19 PM  Result Value Ref Range Status   Specimen Description   Final    URINE, RANDOM Performed at [facility] [address], [place], [place] [address]    Special Requests   Final    NONE Performed at [facility], [place] [address], [place], [place] [address]    Culture   Final     NO GROWTH Performed at [facility], [employer] [address], [place], [place] [address]    Report Status 12/22/2017 FINAL  Final  Culture, blood (Routine x 2)     Status: None (Preliminary result)   Collection Time: 12/21/17  1:39 PM  Result Value Ref Range Status   Specimen Description   Final    BLOOD RIGHT ANTECUBITAL Performed at [facility] [address] [place], [place] [address]    Special Requests   Final    BOTTLES DRAWN AEROBIC AND ANAEROBIC Blood Culture results may not be optimal due to an excessive volume of blood received in culture bottles Performed at [facility] [address], [place], [place] [address]    Culture   Final    NO GROWTH < 24 HOURS Performed at [facility] [place] [address] [place], [place] [address]    Report Status PENDING  Incomplete  Body fluid culture     Status: None (Preliminary result)   Collection Time: 12/21/17  6:28 PM  Result Value Ref Range Status   Specimen Description  Final    KNEE LEFT Performed at [facility] [facility], [place] [address], [place], [place] [address]    Special Requests   Final    Normal Performed at [facility], [place] [address] [place], [place] [address]    Gram Stain   Final    RARE WBC PRESENT, PREDOMINANTLY PMN NO ORGANISMS SEEN    Culture   Final    NO GROWTH 2 DAYS Performed at [facility] [address], [place],  [address]    Report Status PENDING  Incomplete   Time coordinating discharge: 35 minutes  SIGNED:  Kerney Elbe, DO [facility] 12/23/2017, 1:47 PM Pager is on [place]  If 7PM-7AM, please contact night-coverage [email or web address] Password [email or web address]

## 2017-12-23 NOTE — Progress Notes (Signed)
At approximately 1810, I received a call from [place], representative from [facility] regarding patient discharge questioning prior insurance authorization. Spoke with [place] Hearing Social Worker, for assistance with obtaining authorization for patient's admittance to [employer]. Roderic Palau to follow up with Irine Seal at [facility] at [phone]. Roderic Palau will Microbiologist back with update. Per Social Worker note from 12/22/17, patient will need new [employer] authorization.

## 2017-12-23 NOTE — Evaluation (Signed)
Occupational Therapy Evaluation Patient Details Name: Jeffrey Porter MRN: [id number] DOB: 09-12-1931 Today's Date: 12/23/2017    History of Present Illness Pt was admitted with sepsis; recently admitted with the same and was at [facility].  L ankle aspirated this admission.  Has cellulitis.  Had uti last admission   Clinical Impression   This 84 year old man was admitted for the above. He was recently sent to [facility] for rehab, and he needed min to mod A for adls at that time.  He is at a similar level now. Goals in acute are for min guard assist.  Recommend return to rehab when medically stable    Follow Up Recommendations  SNF    Equipment Recommendations  3 in 1 bedside commode    Recommendations for Other Services       Precautions / Restrictions Precautions Precautions: Fall Restrictions Weight Bearing Restrictions: No      Mobility Bed Mobility     Rolling: Supervision Sidelying to sit: Min guard       General bed mobility comments: extra time, flat bed and use of rail  Transfers   Equipment used: Rolling walker (2 wheeled)   Sit to Stand: Min assist         General transfer comment: steadying assistance; festinating gait    Balance                                           ADL either performed or assessed with clinical judgement   ADL   Eating/Feeding: Independent   Grooming: Oral care;Supervision/safety;Standing   Upper Body Bathing: Set up;Sitting   Lower Body Bathing: Minimal assistance;Sit to/from stand   Upper Body Dressing : Set up;Sitting   Lower Body Dressing: Moderate assistance;Sit to/from stand   Toilet Transfer: Minimal assistance;Ambulation;RW(chair)   Toileting- Clothing Manipulation and Hygiene: Minimal assistance;Sit to/from stand         General ADL Comments: extra time for all activities     Vision         Perception     Praxis      Pertinent Vitals/Pain Pain Assessment: Faces Faces Pain  Scale: Hurts little more Pain Location: L ankle and back Pain Descriptors / Indicators: Sore Pain Intervention(s): Limited activity within patient's tolerance;Monitored during session;Repositioned     Hand Dominance     Extremity/Trunk Assessment Upper Extremity Assessment Upper Extremity Assessment: Overall WFL for tasks assessed           Communication Communication Communication: No difficulties   Cognition Arousal/Alertness: Awake/alert Behavior During Therapy: WFL for tasks assessed/performed                                   General Comments: mostly wfls but needs cues for safety and problem solving (kept trying to turn off cold water when hot was on)   General Comments       Exercises     Shoulder Instructions      Home Living Family/patient expects to be discharged to:: Skilled nursing facility                                 Additional Comments: was at [facility].        Prior Functioning/Environment Level of  Independence: Needs assistance    ADL's / Homemaking Assistance Needed: min to mod A             OT Problem List: Decreased activity tolerance;Pain;Decreased knowledge of use of DME or AE;Cardiopulmonary status limiting activity;Impaired balance (sitting and/or standing)      OT Treatment/Interventions: Self-care/ADL training;DME and/or AE instruction;Energy conservation;Patient/family education;Balance training;Therapeutic activities    OT Goals(Current goals can be found in the care plan section) Acute Rehab OT Goals Patient Stated Goal: return to [facility] to regain independence OT Goal Formulation: With patient Time For Goal Achievement: 01/06/18 Potential to Achieve Goals: Good ADL Goals Pt Will Transfer to Toilet: with min guard assist;ambulating;bedside commode(and complete hygiene at min guard level) Additional ADL Goal #1: pt will perform adl with set up/min guard for standing and min A for left sock  only Additional ADL Goal #2: pt will not need any more than 1 safety cue per session  OT Frequency: Min 2X/week   Barriers to D/C:            Co-evaluation              AM-PAC PT "6 Clicks" Daily Activity     Outcome Measure Help from another person eating meals?: None Help from another person taking care of personal grooming?: A Little Help from another person toileting, which includes using toliet, bedpan, or urinal?: A Little Help from another person bathing (including washing, rinsing, drying)?: A Little Help from another person to put on and taking off regular upper body clothing?: A Little Help from another person to put on and taking off regular lower body clothing?: A Lot 6 Click Score: 18   End of Session    Activity Tolerance: Patient tolerated treatment well Patient left: in chair;with call bell/phone within reach;with chair alarm set  OT Visit Diagnosis: Muscle weakness (generalized) (M62.81)                Time: 0539-7673 OT Time Calculation (min): 31 min Charges:  OT General Charges $OT Visit: 1 Visit OT Evaluation $OT Eval Low Complexity: 1 Low OT Treatments $Self Care/Home Management : 8-22 mins  Lesle Chris, OTR/L [phone] 12/23/2017  Jeffrey Porter 12/23/2017, 10:06 AM

## 2017-12-23 NOTE — Progress Notes (Signed)
Reviewed discharge paperwork with patient. Advised patient of what conditions to contact the doctor for. Went over current list of medications and when to take them. Advised patient of the needed follow up appointments & [facility]. Patient states [facility]. I offered to have the doctor send them [facility] a different [facility], patient declined. Stated that they can have them pulled [facility] any [facility]. Patient discharged in wheelchair with daughter.

## 2017-12-23 NOTE — Progress Notes (Signed)
Incomplete         Show:Clear all [x] Manual[x] Template[] Copied  Added by: [x] Yvonnie Schinke, Alphonse Guild, LCSWA  [] Hover for details CSW spoke to Roosevelt at [facility] who states pt has [place] and was D/C'd from [facility] ED today even though the pt's new insurance auth had not begun since pt is lacking a PT note.  Per chart, pt was D/C'd without social work involvement and that the last social work was a note stating pt would need an insurance auth prior to pt's D/C to [facility], if an insurance auth was approved in that case.   7:27 PM  Pt was brought by the pt's daughter to [facility]. CSW spoke to the pt's daughter Lanny Cramp  Pt's daughter states that the pt's RN D/C'd the pt, [facility] the [facility].  Pt's daughter stated she brought the pt to [facility] and was told the pt's insurance Josem Kaufmann was not completed yet.  CSW noted in chart the pt's PT recommendation was completed and sent the PT results to the SNF.    Pt's daughter asked why pt was D/C'd before the pt's insurance auth was completed and CSW noted in the chart that the pt's physician had issued the D/C summary due to the pt's [place] being completed and the pt's RN had D/C'd the pt accordingly.  CSW was asked by the pt's daughter why the pt was D/C'd by the doctor before the pt's Josem Kaufmann was completed and CSW stated that generally it was the CSW's understanding that pt's can't remain in the hospital overnight just for placement issues and that further just because a SNF auth was begun it doesn't mean that an insurance auth will be approvded by [employer].    Pt's daughter voiced understanding and asked for options and discussed bring pt back to the ED for possible admittance.  CSW stated she could but it was up to the EDP as to whether pt would or would not be re-admitted and offered pt's daughter option of [place] [facility] resources and explained what is offered.  Pt's daughter took contact information for some [place] resources and  started she would call Winona Legato and discuss private pay options at this time while a decision on the pt's insurance Josem Kaufmann was being made by [employer].  CSW sent pt's PTY results to [facility].  Please reconsult if future social work needs arise.  CSW signing off, as social work intervention is no longer needed.  Alphonse Guild. Adriana Quinby, LCSW, LCAS, CSI Clinical Social Worker Ph: [phone]          Recon

## 2017-12-25 LAB — BODY FLUID CULTURE
Culture: NO GROWTH
Special Requests: NORMAL

## 2017-12-26 DIAGNOSIS — M25472 Effusion, left ankle: Secondary | ICD-10-CM | POA: Diagnosis not present

## 2017-12-26 DIAGNOSIS — D72829 Elevated white blood cell count, unspecified: Secondary | ICD-10-CM | POA: Diagnosis not present

## 2017-12-26 LAB — CULTURE, BLOOD (ROUTINE X 2)
CULTURE: NO GROWTH
CULTURE: NO GROWTH
SPECIAL REQUESTS: ADEQUATE

## 2017-12-27 ENCOUNTER — Encounter: Payer: Self-pay | Admitting: Neurology

## 2018-01-03 DIAGNOSIS — Z7189 Other specified counseling: Secondary | ICD-10-CM | POA: Diagnosis not present

## 2018-01-03 DIAGNOSIS — Z6822 Body mass index (BMI) 22.0-22.9, adult: Secondary | ICD-10-CM | POA: Diagnosis not present

## 2018-01-03 DIAGNOSIS — I350 Nonrheumatic aortic (valve) stenosis: Secondary | ICD-10-CM | POA: Diagnosis not present

## 2018-01-03 DIAGNOSIS — R2689 Other abnormalities of gait and mobility: Secondary | ICD-10-CM | POA: Diagnosis not present

## 2018-01-03 DIAGNOSIS — L03116 Cellulitis of left lower limb: Secondary | ICD-10-CM | POA: Diagnosis not present

## 2018-01-03 DIAGNOSIS — R351 Nocturia: Secondary | ICD-10-CM | POA: Diagnosis not present

## 2018-01-04 ENCOUNTER — Ambulatory Visit: Payer: Medicare HMO | Attending: Family Medicine | Admitting: Physical Therapy

## 2018-01-04 ENCOUNTER — Encounter: Payer: Self-pay | Admitting: Physical Therapy

## 2018-01-04 DIAGNOSIS — M6281 Muscle weakness (generalized): Secondary | ICD-10-CM | POA: Diagnosis not present

## 2018-01-04 DIAGNOSIS — R2689 Other abnormalities of gait and mobility: Secondary | ICD-10-CM | POA: Insufficient documentation

## 2018-01-04 NOTE — Patient Instructions (Signed)
Access Code: [email or web address]  URL: [email or web address]  Date: 01/04/2018  Prepared by: Lyndee Hensen   Exercises  Seated March - 10 reps - 2 sets - 2x daily  Seated Long Arc Quad - 10 reps - 2 sets - 2x daily  Seated Hip Abduction with Resistance - 10 reps - 2 sets - 2x daily  Seated Hip Adduction Isometrics with Ball - 10 reps - 2 sets - 2x daily

## 2018-01-04 NOTE — Therapy (Signed)
[facility] [address], [place], [place], [address] Phone: [phone]   Fax:  [phone]  Physical Therapy Evaluation  Patient Details  Name: Jeffrey Porter MRN: [id number] Date of Birth: 1932-01-03 Referring Provider: [name]   Encounter Date: 01/04/2018  PT End of Session - 01/04/18 1212    Visit Number  1    Number of Visits  12    Date for PT Re-Evaluation  02/15/18    Authorization Type  [employer]    PT Start Time  1120    PT Stop Time  1210    PT Time Calculation (min)  50 min    Equipment Utilized During Treatment  Gait belt    Activity Tolerance  Patient tolerated treatment well    Behavior During Therapy  [facility] for tasks assessed/performed       Past Medical History:  Diagnosis Date  . Glaucoma   . Prostate cancer [facility])     Past Surgical History:  Procedure Laterality Date  . CATARACT EXTRACTION    . HERNIA REPAIR      There were no vitals filed for this visit.   Subjective Assessment - 01/04/18 1122    Subjective  Pt had recent hospital stay for 5-6 days then 3-4 days. Home on 8/31. Prior to this, he Was living at home alone, had fall, had fever, sepsis, also had cellulitis in L lower leg.  Prior to hospital, he was using cane, independent living, driving. He is now staying with daughter. She works, he has home care help 4 hours/day. He walks by himself with walker, Requires assist with shower, dressing.  He is open to idea of staying with daughter, doesnt think he will be able to go back to independent living at home. He states concern for his severe aortic stenosis. He states no pain.     Limitations  Standing;Walking;House hold activities    Patient Stated Goals  Improve strength, walking. improved endurance.     Currently in Pain?  No/denies    Pain Score  0-No pain         [facility] PT Assessment - 01/04/18 0001      Assessment   Medical Diagnosis  Gait Abnormality    Referring  Provider  [name]    Prior Therapy  Yes, for back pain      Precautions   Precautions  Fall      Restrictions   Weight Bearing Restrictions  No      Balance Screen   Has the patient fallen in the past 6 months  Yes    How many times?  1    Has the patient had a decrease in activity level because of a fear of falling?   Yes    Is the patient reluctant to leave their home because of a fear of falling?   No      Prior Function   Level of Independence  Independent;Needs assistance with ADLs;Needs assistance with homemaking;Requires assistive device for independence;Independent with transfers      Cognition   Overall Cognitive Status  Within Functional Limits for tasks assessed      ROM / Strength   AROM / PROM / Strength  AROM;Strength      AROM   Overall AROM Comments  UE: WFL;  LE: WFL      Strength   Overall Strength Comments  UE: 4/5;  LE: 4/5 gross      Ambulation/Gait  Ambulation/Gait  Yes    Ambulation/Gait Assistance  5: Supervision    Ambulation Distance (Feet)  40 Feet    Assistive device  Rolling walker    Gait Pattern  Decreased step length - left;Decreased step length - right;Decreased stride length;Decreased dorsiflexion - right;Shuffle    Gait Comments  Inconsistent step length and step height,  Pt with significant shuffling with direction changes and turning around.       Balance   Balance Assessed  Yes      Standardized Balance Assessment   Standardized Balance Assessment  Berg Balance Test      Berg Balance Test   Sit to Stand  Able to stand  independently using hands    Standing Unsupported  Able to stand safely 2 minutes    Sitting with Back Unsupported but Feet Supported on Floor or Stool  Able to sit safely and securely 2 minutes    Stand to Sit  Controls descent by using hands    Transfers  Able to transfer safely, definite need of hands    Standing Unsupported with Eyes Closed  Able to stand 10 seconds with supervision    Standing  Ubsupported with Feet Together  Able to place feet together independently and stand 1 minute safely    From Standing, Reach Forward with Outstretched Arm  Can reach forward >12 cm safely (5")    From Standing Position, Pick up Object from Floor  Able to pick up shoe, needs supervision    From Standing Position, Turn to Look Behind Over each Shoulder  Turn sideways only but maintains balance    Turn 360 Degrees  Needs close supervision or verbal cueing    Standing Unsupported, Alternately Place Feet on Step/Stool  Able to complete >2 steps/needs minimal assist    Standing Unsupported, One Foot in Front  Needs help to step but can hold 15 seconds    Standing on One Leg  Tries to lift leg/unable to hold 3 seconds but remains standing independently    Total Score  36                Objective measurements completed on examination: See above findings.      [facility] Adult PT Treatment/Exercise - 01/04/18 0001      Exercises   Exercises  Knee/Hip      Knee/Hip Exercises: Seated   Long Arc Quad  10 reps;Both    Marching  20 reps    Abduction/Adduction   10 reps    Abd/Adduction Limitations  Seated hip add ball squeeze,  Hip abd RTB x10;              PT Education - 01/04/18 1207    Education Details  PT POC, HEP ,    Person(s) Educated  Patient    Methods  Explanation;Handout;Demonstration    Comprehension  Verbalized understanding;Need further instruction       PT Short Term Goals - 01/04/18 1356      PT SHORT TERM GOAL #1   Title  Pt to be independent with initial HEP     Time  2    Period  Weeks    Status  New    Target Date  01/18/18      PT SHORT TERM GOAL #2   Title  Pt to demo ability for improved ability for changing directions with gait, in less than 5 sec, using RW.     Time  2    Period  Weeks    Status  New    Target Date  01/18/18        PT Long Term Goals - 01/04/18 1352      PT LONG TERM GOAL #1   Title  Pt to demo improved score on BERG ,  by at least 5 points, to improve safety with mobility.     Time  6    Period  Weeks    Status  New    Target Date  02/15/18      PT LONG TERM GOAL #2   Title  Pt to demo improved strength of Bil LEs, to at least 4+/5 to improve stability and gait.     Time  6    Period  Weeks    Status  New    Target Date  02/15/18      PT LONG TERM GOAL #3   Title  Pt to demo safe ambulation with LRAD, with consistent ability for proper stride length and step height, for at least 257ft, to improve safety and fall risk.     Time  6    Period  Weeks    Status  New    Target Date  02/15/18      PT LONG TERM GOAL #4   Title  Pt to be independent with final HEP    Time  6    Period  Weeks    Status  New    Target Date  02/15/18             Plan - 01/04/18 1216    Clinical Impression Statement  Pt presents with primary complaint of decreased mobility, since recent hospital stay. He is overall deconditioned, with decreased strength and decreased endurance. He has poor dynamic balance, as seen with balance testing today. He has score on BERG that places him at a risk for falls. With gait, he requries use of RW, and has very inconsistent step length and initiation of movement, with intermittent times of significant shuffling, and inability for advancing feet. Gait pattern also is increasing his risk of falls. Pt with decreased ability and endurance for full functional activities, an will beneift from skilled PT to improve this.     Clinical Presentation  Stable    Clinical Decision Making  Moderate    Rehab Potential  Good    PT Frequency  2x / week    PT Duration  6 weeks    PT Treatment/Interventions  ADLs/Self Care Home Management;Cryotherapy;Electrical Stimulation;Iontophoresis 4mg /ml Dexamethasone;Moist Heat;Therapeutic activities;Functional mobility training;Stair training;Gait training;DME Instruction;Ultrasound;Therapeutic exercise;Balance training;Neuromuscular re-education;Patient/family  education;Dry needling;Passive range of motion;Manual techniques;Wheelchair mobility training;Taping;Vasopneumatic Device    PT Next Visit Plan  LE strength, Standing static and dynamic balance, Gait training    Consulted and Agree with Plan of Care  Patient       Patient will benefit from skilled therapeutic intervention in order to improve the following deficits and impairments:  Abnormal gait, Decreased endurance, Decreased activity tolerance, Decreased strength, Difficulty walking, Decreased mobility, Decreased balance, Improper body mechanics, Impaired flexibility, Decreased coordination  Visit Diagnosis: Other abnormalities of gait and mobility - Plan: PT plan of care cert/re-cert  Muscle weakness (generalized) - Plan: PT plan of care cert/re-cert     Problem List Patient Active Problem List   Diagnosis Date Noted  . Essential hypertension 12/22/2017  . History of prostate cancer 12/22/2017  . Glaucoma 12/22/2017  . Aortic stenosis 12/22/2017  . OA (osteoarthritis) 12/22/2017  . Abnormal  LFTs 12/22/2017  . Thrombocytosis ([place]) 12/22/2017  . Normocytic anemia 12/22/2017  . Hyponatremia 12/22/2017  . Cellulitis and abscess of left leg 12/21/2017  . Left ankle swelling   . Acute respiratory failure with hypoxia ([place])   . Paroxysmal supraventricular tachycardia ([place])   . Sepsis ([place]) 12/06/2017  . Acute lower UTI 12/06/2017  . Tachycardia 12/06/2017  . Leukocytosis 12/06/2017    Lyndee Hensen, PT, DPT 3:32 PM  01/04/18    [facility] [address], [place], [place], [address] Phone: [phone]   Fax:  [phone]  Name: Jeffrey Porter MRN: [id number] Date of Birth: Oct 03, 1931

## 2018-01-06 DIAGNOSIS — E785 Hyperlipidemia, unspecified: Secondary | ICD-10-CM | POA: Diagnosis not present

## 2018-01-06 DIAGNOSIS — I350 Nonrheumatic aortic (valve) stenosis: Secondary | ICD-10-CM | POA: Diagnosis not present

## 2018-01-06 DIAGNOSIS — H4010X0 Unspecified open-angle glaucoma, stage unspecified: Secondary | ICD-10-CM | POA: Diagnosis not present

## 2018-01-06 DIAGNOSIS — Z9181 History of falling: Secondary | ICD-10-CM | POA: Diagnosis not present

## 2018-01-06 DIAGNOSIS — Z8701 Personal history of pneumonia (recurrent): Secondary | ICD-10-CM | POA: Diagnosis not present

## 2018-01-06 DIAGNOSIS — G214 Vascular parkinsonism: Secondary | ICD-10-CM | POA: Diagnosis not present

## 2018-01-06 DIAGNOSIS — M545 Low back pain: Secondary | ICD-10-CM | POA: Diagnosis not present

## 2018-01-06 DIAGNOSIS — Z8546 Personal history of malignant neoplasm of prostate: Secondary | ICD-10-CM | POA: Diagnosis not present

## 2018-01-06 DIAGNOSIS — Z87440 Personal history of urinary (tract) infections: Secondary | ICD-10-CM | POA: Diagnosis not present

## 2018-01-06 DIAGNOSIS — Z87891 Personal history of nicotine dependence: Secondary | ICD-10-CM | POA: Diagnosis not present

## 2018-01-06 NOTE — Progress Notes (Signed)
Jeffrey Porter was seen today in the movement disorders clinic for neurologic consultation at the request of Jeffrey Lass, MD.  The consultation is for the evaluation of possible PD.  The records that were made available to me were reviewed.  This patient is accompanied in the office by his child and son in law who supplements the history.  Pt previously saw [name] but only one time on 07/14/17.  Symptoms began about a year and a half ago.  He went on a vacation to [place] and noted some difficulty with strenuous walking.  [name] felt that he had a "nonspecific gait disorder with initial stutter steps and difficulty with turns but nonspecific overall, no telltale signs of Parkinson's disease or parkinsonism."  He was in the hospital recently for Klebsiella sepsis and acute respiratory failure and then came back to the hospital for left ankle pain/cellulitis.    Specific Symptoms:  Tremor: No. Family hx of similar:  No. Voice: "I always was low tone."  Daughter states that clarity of speech has decreased Sleep: intermittently has some trouble based on where he is sleeping   Vivid Dreams:  No.  Acting out dreams:  No. Wet Pillows: No. Postural symptoms:  Yes.   x 1 year  Falls?  Yes.  , one time; august 13 - got up in the middle of the night to throw up and when he quit throwing up and stood up, he fell backward and got stuck between the toilet and bed.  Ended up sleeping there.  Staying with daughter now Bradykinesia symptoms: he denies trouble with getting up but daughter states that he does; admits to stutter steps when first gets up. Loss of smell:  No. Loss of taste:  Yes.   Urinary Incontinence:  No. Difficulty Swallowing:  Yes.  , because of dry mouth Handwriting, micrographia: Yes.   Trouble with ADL's:  No.  Trouble buttoning clothing: No. Depression:  No. per pt but daughter states that he can get there Memory changes:  No. per pt; daughter disagrees and has to remind him  about things frequently Hallucinations:  No.  visual distortions: No. N/V:  No. Lightheaded:  No.  Syncope: No. Diplopia:  No. Dyskinesia:  No. Prior exposure to reglan/antipsychotics: No.  Neuroimaging of the brain has not been performed previously.  PREVIOUS MEDICATIONS: none to date  ALLERGIES:   Allergies  Allergen Reactions  . Timolol Maleate Other (See Comments)    Syncope     CURRENT MEDICATIONS:  Outpatient Encounter Medications as of 01/09/2018  Medication Sig  . acetaminophen (TYLENOL) 500 MG tablet Take 500 mg by mouth daily as needed for moderate pain.  Marland Kitchen amLODipine (NORVASC) 5 MG tablet Take 1 tablet (5 mg total) by mouth daily.  . Cholecalciferol (VITAMIN D) 2000 units tablet Take 2,000 Units by mouth daily.  Marland Kitchen latanoprost (XALATAN) 0.005 % ophthalmic solution Place 1 drop into both eyes at bedtime.   . LUTEIN-ZEAXANTHIN PO Take 1 tablet by mouth daily.  . Magnesium 250 MG TABS Take 250 mg by mouth daily.  . Multiple Vitamins-Minerals (HAIR SKIN AND NAILS FORMULA PO) Take by mouth.  . niacin 100 MG tablet Take 100 mg by mouth at bedtime.  . [DISCONTINUED] Multiple Vitamin (MULTIVITAMIN WITH MINERALS) TABS tablet Take 1 tablet by mouth daily.  . [DISCONTINUED] meloxicam (MOBIC) 7.5 MG tablet Take 7.5 mg by mouth daily as needed for pain.   No facility-administered encounter medications on file as of 01/09/2018.  PAST MEDICAL HISTORY:   Past Medical History:  Diagnosis Date  . Glaucoma   . Prostate cancer ([facility])     PAST SURGICAL HISTORY:   Past Surgical History:  Procedure Laterality Date  . CATARACT EXTRACTION    . HERNIA REPAIR      SOCIAL HISTORY:   Social History   Socioeconomic History  . Marital status: Widowed    Spouse name: Not on file  . Number of children: Not on file  . Years of education: Not on file  . Highest education level: Not on file  Occupational History  . Not on file  Social Needs  . Financial resource strain: Not on  file  . Food insecurity:    Worry: Not on file    Inability: Not on file  . Transportation needs:    Medical: Not on file    Non-medical: Not on file  Tobacco Use  . Smoking status: Former Smoker    Types: Cigarettes  . Smokeless tobacco: Never Used  Substance and Sexual Activity  . Alcohol use: Yes    Alcohol/week: 1.0 standard drinks    Types: 1 Cans of beer per week  . Drug use: Not on file  . Sexual activity: Not on file  Lifestyle  . Physical activity:    Days per week: Not on file    Minutes per session: Not on file  . Stress: Not on file  Relationships  . Social connections:    Talks on phone: Not on file    Gets together: Not on file    Attends religious service: Not on file    Active member of club or organization: Not on file    Attends meetings of clubs or organizations: Not on file    Relationship status: Not on file  . Intimate partner violence:    Fear of current or ex partner: Not on file    Emotionally abused: Not on file    Physically abused: Not on file    Forced sexual activity: Not on file  Other Topics Concern  . Not on file  Social History Narrative  . Not on file    FAMILY HISTORY:   Family Status  Relation Name Status  . Mother  Deceased  . Father  Deceased    ROS:  Review of Systems  Constitutional: Negative.   HENT:       Dry mouth  Respiratory: Negative.   Cardiovascular: Negative.   Gastrointestinal: Negative.   Genitourinary: Negative.   Musculoskeletal: Negative.   Skin: Negative.   Endo/Heme/Allergies: Negative.   Psychiatric/Behavioral: Negative.     PHYSICAL EXAMINATION:    VITALS:   Vitals:   01/09/18 1243  BP: 138/84  Pulse: 92  SpO2: 96%  Weight: 128 lb (58.1 kg)  Height: 5\' 3"  (1.6 m)    GEN:  The patient appears stated age and is in NAD. HEENT:  Normocephalic, atraumatic.  The mucous membranes are moist. The superficial temporal arteries are without ropiness or tenderness. CV:  RRR with 3/6 SEM Lungs:   CTAB Neck/HEME:  There are no carotid bruits bilaterally.  Neurological examination:  Orientation: The patient is alert and oriented x3. Fund of knowledge is appropriate.  Recent and remote memory are intact.  Attention and concentration are normal.    Able to name objects and repeat phrases. Cranial nerves: There is good facial symmetry. Pupils are equal round and reactive to light bilaterally. Fundoscopic exam is attempted but the disc margins are not  well visualized bilaterally. Extraocular muscles are intact. The visual fields are full to confrontational testing. The speech is fluent and clear. Soft palate rises symmetrically and there is no tongue deviation. Hearing is intact to conversational tone. Sensation: Sensation is intact to light and pinprick throughout (facial, trunk, extremities). Vibration is intact at the bilateral big toe. There is no extinction with double simultaneous stimulation. There is no sensory dermatomal level identified. Motor: Strength is 5/5 in the bilateral upper and lower extremities.   Shoulder shrug is equal and symmetric.  There is no pronator drift. Deep tendon reflexes: Deep tendon reflexes are 2+/4 at the bilateral biceps, triceps, brachioradialis, patella and 1/4 at the bilateral achilles. Plantar responses are downgoing bilaterally.  Movement examination: Tone: There is minimal increased tone in the LUE.  All other tone is normal Abnormal movements: there is no rest tremor.   There is jaw tremor when distracted with other tasks Coordination:  There is decremation with RAM's, only with toe taps on the left.  All other RAMs are normal Gait and Station: The patient has no difficulty arising out of a deep-seated chair without the use of the hands. The patient has significant start hesitation and freezing in the doorway.  Once out in the hall, he is able to take much longer steps.  He does better when given a walker.    Chemistry      Component Value Date/Time     NA 135 12/23/2017 0358   K 3.7 12/23/2017 0358   CL 101 12/23/2017 0358   CO2 24 12/23/2017 0358   BUN 12 12/23/2017 0358   CREATININE 0.54 (L) 12/23/2017 0358      Component Value Date/Time   CALCIUM 8.7 (L) 12/23/2017 0358   ALKPHOS 283 (H) 12/23/2017 0358   AST 61 (H) 12/23/2017 0358   ALT 152 (H) 12/23/2017 0358   BILITOT 1.3 (H) 12/23/2017 0358     Lab Results  Component Value Date   TSH 0.861 12/06/2017     ASSESSMENT/PLAN:  1.  Vascular Parkinsonism  -I had a long discussion with the patient and family.  I told the patient that I do not think that this is idiopathic parkinsons disease but I do think that it is possible that the patient has vascular parkinsonism.  This is evidenced by the fact that the patient looks pretty good clinically when sitting down but looks more parkinsonian when ambulating, with short shuffling steps.  I talked to the patient about the difference between idiopathic Parkinson's disease and vascular parkinsonism.  I told the patient that carbidopa/levodopa only works about 30% of the time in patients with vascular parkinsonism, and in those patients that it does work, it usually only works for a few years.  The patient would like to try the medication.  Risks, benefits, side effects and alternative therapies were discussed.  The opportunity to ask questions was given and they were answered to the best of my ability.  The patient expressed understanding and willingness to follow the outlined treatment protocols.  -Talked about the importance of exercises and community resources/patient education were provided to the patient and his family.  -pt currently in home PT   2.  transaminasemia  -pt believes from sepsis.  He has an appt with PCP next month to recheck  3.  Follow up is anticipated in the next few months, sooner should new neurologic issues arise.  Much greater than 50% of this visit was spent in counseling and coordinating care.  Total face to  face time:  45 min  Cc:  Jeffrey Lass, MD

## 2018-01-09 ENCOUNTER — Ambulatory Visit: Payer: Medicare HMO | Admitting: Neurology

## 2018-01-09 ENCOUNTER — Encounter: Payer: Self-pay | Admitting: Neurology

## 2018-01-09 VITALS — BP 138/84 | HR 92 | Ht 63.0 in | Wt 128.0 lb

## 2018-01-09 DIAGNOSIS — G214 Vascular parkinsonism: Secondary | ICD-10-CM | POA: Diagnosis not present

## 2018-01-09 DIAGNOSIS — R740 Nonspecific elevation of levels of transaminase and lactic acid dehydrogenase [LDH]: Secondary | ICD-10-CM | POA: Diagnosis not present

## 2018-01-09 DIAGNOSIS — R7401 Elevation of levels of liver transaminase levels: Secondary | ICD-10-CM

## 2018-01-09 MED ORDER — CARBIDOPA-LEVODOPA 25-100 MG PO TABS: 1.0000 | ORAL_TABLET | Freq: Three times a day (TID) | ORAL | 1 refills | Status: DC

## 2018-01-09 NOTE — Patient Instructions (Signed)
Start Carbidopa Levodopa as follows:  Take 1/2 tablet three times daily, at least 30 minutes before meals, for one week  Then take 1/2 tablet in the morning, 1/2 tablet in the afternoon, 1 tablet in the evening, at least 30 minutes before meals, for one week  Then take 1/2 tablet in the morning, 1 tablet in the afternoon, 1 tablet in the evening, at least 30 minutes before meals, for one week  Then take 1 tablet three times daily, at least 30 minutes before meals   As a reminder, carbidopa/levodopa can be taken at the same time as a carbohydrate, but we like to have you take your pill either 30 minutes before a protein source or 1 hour after as protein can interfere with carbidopa/levodopa absorption.  

## 2018-01-10 DIAGNOSIS — Z8701 Personal history of pneumonia (recurrent): Secondary | ICD-10-CM | POA: Diagnosis not present

## 2018-01-10 DIAGNOSIS — H4010X0 Unspecified open-angle glaucoma, stage unspecified: Secondary | ICD-10-CM | POA: Diagnosis not present

## 2018-01-10 DIAGNOSIS — E785 Hyperlipidemia, unspecified: Secondary | ICD-10-CM | POA: Diagnosis not present

## 2018-01-10 DIAGNOSIS — Z9181 History of falling: Secondary | ICD-10-CM | POA: Diagnosis not present

## 2018-01-10 DIAGNOSIS — Z8546 Personal history of malignant neoplasm of prostate: Secondary | ICD-10-CM | POA: Diagnosis not present

## 2018-01-10 DIAGNOSIS — Z87891 Personal history of nicotine dependence: Secondary | ICD-10-CM | POA: Diagnosis not present

## 2018-01-10 DIAGNOSIS — G214 Vascular parkinsonism: Secondary | ICD-10-CM | POA: Diagnosis not present

## 2018-01-10 DIAGNOSIS — M545 Low back pain: Secondary | ICD-10-CM | POA: Diagnosis not present

## 2018-01-10 DIAGNOSIS — I350 Nonrheumatic aortic (valve) stenosis: Secondary | ICD-10-CM | POA: Diagnosis not present

## 2018-01-10 DIAGNOSIS — Z87440 Personal history of urinary (tract) infections: Secondary | ICD-10-CM | POA: Diagnosis not present

## 2018-01-12 ENCOUNTER — Ambulatory Visit: Payer: Medicare HMO

## 2018-01-12 DIAGNOSIS — Z9181 History of falling: Secondary | ICD-10-CM | POA: Diagnosis not present

## 2018-01-12 DIAGNOSIS — E785 Hyperlipidemia, unspecified: Secondary | ICD-10-CM | POA: Diagnosis not present

## 2018-01-12 DIAGNOSIS — I350 Nonrheumatic aortic (valve) stenosis: Secondary | ICD-10-CM | POA: Diagnosis not present

## 2018-01-12 DIAGNOSIS — H4010X0 Unspecified open-angle glaucoma, stage unspecified: Secondary | ICD-10-CM | POA: Diagnosis not present

## 2018-01-12 DIAGNOSIS — Z87891 Personal history of nicotine dependence: Secondary | ICD-10-CM | POA: Diagnosis not present

## 2018-01-12 DIAGNOSIS — Z8701 Personal history of pneumonia (recurrent): Secondary | ICD-10-CM | POA: Diagnosis not present

## 2018-01-12 DIAGNOSIS — G214 Vascular parkinsonism: Secondary | ICD-10-CM | POA: Diagnosis not present

## 2018-01-12 DIAGNOSIS — Z8546 Personal history of malignant neoplasm of prostate: Secondary | ICD-10-CM | POA: Diagnosis not present

## 2018-01-12 DIAGNOSIS — M545 Low back pain: Secondary | ICD-10-CM | POA: Diagnosis not present

## 2018-01-12 DIAGNOSIS — Z87440 Personal history of urinary (tract) infections: Secondary | ICD-10-CM | POA: Diagnosis not present

## 2018-01-17 DIAGNOSIS — Z8546 Personal history of malignant neoplasm of prostate: Secondary | ICD-10-CM | POA: Diagnosis not present

## 2018-01-17 DIAGNOSIS — M545 Low back pain: Secondary | ICD-10-CM | POA: Diagnosis not present

## 2018-01-17 DIAGNOSIS — Z87891 Personal history of nicotine dependence: Secondary | ICD-10-CM | POA: Diagnosis not present

## 2018-01-17 DIAGNOSIS — Z9181 History of falling: Secondary | ICD-10-CM | POA: Diagnosis not present

## 2018-01-17 DIAGNOSIS — H4010X0 Unspecified open-angle glaucoma, stage unspecified: Secondary | ICD-10-CM | POA: Diagnosis not present

## 2018-01-17 DIAGNOSIS — Z87440 Personal history of urinary (tract) infections: Secondary | ICD-10-CM | POA: Diagnosis not present

## 2018-01-17 DIAGNOSIS — I350 Nonrheumatic aortic (valve) stenosis: Secondary | ICD-10-CM | POA: Diagnosis not present

## 2018-01-17 DIAGNOSIS — E785 Hyperlipidemia, unspecified: Secondary | ICD-10-CM | POA: Diagnosis not present

## 2018-01-17 DIAGNOSIS — G214 Vascular parkinsonism: Secondary | ICD-10-CM | POA: Diagnosis not present

## 2018-01-17 DIAGNOSIS — Z8701 Personal history of pneumonia (recurrent): Secondary | ICD-10-CM | POA: Diagnosis not present

## 2018-01-19 ENCOUNTER — Telehealth: Payer: Self-pay | Admitting: Neurology

## 2018-01-19 NOTE — Telephone Encounter (Signed)
Received vm from [place], [place] with [facility], wanting to confirm the diagnosis of Vascular Parkinsonism. Called her back at [phone] and confirmed the diagnosis.

## 2018-01-20 ENCOUNTER — Encounter: Payer: Medicare HMO | Admitting: Physical Therapy

## 2018-01-20 DIAGNOSIS — Z8701 Personal history of pneumonia (recurrent): Secondary | ICD-10-CM | POA: Diagnosis not present

## 2018-01-20 DIAGNOSIS — I350 Nonrheumatic aortic (valve) stenosis: Secondary | ICD-10-CM | POA: Diagnosis not present

## 2018-01-20 DIAGNOSIS — G214 Vascular parkinsonism: Secondary | ICD-10-CM | POA: Diagnosis not present

## 2018-01-20 DIAGNOSIS — M545 Low back pain: Secondary | ICD-10-CM | POA: Diagnosis not present

## 2018-01-20 DIAGNOSIS — Z9181 History of falling: Secondary | ICD-10-CM | POA: Diagnosis not present

## 2018-01-20 DIAGNOSIS — Z8546 Personal history of malignant neoplasm of prostate: Secondary | ICD-10-CM | POA: Diagnosis not present

## 2018-01-20 DIAGNOSIS — Z87440 Personal history of urinary (tract) infections: Secondary | ICD-10-CM | POA: Diagnosis not present

## 2018-01-20 DIAGNOSIS — Z87891 Personal history of nicotine dependence: Secondary | ICD-10-CM | POA: Diagnosis not present

## 2018-01-20 DIAGNOSIS — H4010X0 Unspecified open-angle glaucoma, stage unspecified: Secondary | ICD-10-CM | POA: Diagnosis not present

## 2018-01-20 DIAGNOSIS — E785 Hyperlipidemia, unspecified: Secondary | ICD-10-CM | POA: Diagnosis not present

## 2018-01-23 DIAGNOSIS — M545 Low back pain: Secondary | ICD-10-CM | POA: Diagnosis not present

## 2018-01-23 DIAGNOSIS — Z87440 Personal history of urinary (tract) infections: Secondary | ICD-10-CM | POA: Diagnosis not present

## 2018-01-23 DIAGNOSIS — H4010X0 Unspecified open-angle glaucoma, stage unspecified: Secondary | ICD-10-CM | POA: Diagnosis not present

## 2018-01-23 DIAGNOSIS — Z87891 Personal history of nicotine dependence: Secondary | ICD-10-CM | POA: Diagnosis not present

## 2018-01-23 DIAGNOSIS — Z8546 Personal history of malignant neoplasm of prostate: Secondary | ICD-10-CM | POA: Diagnosis not present

## 2018-01-23 DIAGNOSIS — Z9181 History of falling: Secondary | ICD-10-CM | POA: Diagnosis not present

## 2018-01-23 DIAGNOSIS — G214 Vascular parkinsonism: Secondary | ICD-10-CM | POA: Diagnosis not present

## 2018-01-23 DIAGNOSIS — I350 Nonrheumatic aortic (valve) stenosis: Secondary | ICD-10-CM | POA: Diagnosis not present

## 2018-01-23 DIAGNOSIS — Z8701 Personal history of pneumonia (recurrent): Secondary | ICD-10-CM | POA: Diagnosis not present

## 2018-01-23 DIAGNOSIS — E785 Hyperlipidemia, unspecified: Secondary | ICD-10-CM | POA: Diagnosis not present

## 2018-01-26 DIAGNOSIS — H4010X0 Unspecified open-angle glaucoma, stage unspecified: Secondary | ICD-10-CM | POA: Diagnosis not present

## 2018-01-26 DIAGNOSIS — Z8701 Personal history of pneumonia (recurrent): Secondary | ICD-10-CM | POA: Diagnosis not present

## 2018-01-26 DIAGNOSIS — Z87440 Personal history of urinary (tract) infections: Secondary | ICD-10-CM | POA: Diagnosis not present

## 2018-01-26 DIAGNOSIS — I350 Nonrheumatic aortic (valve) stenosis: Secondary | ICD-10-CM | POA: Diagnosis not present

## 2018-01-26 DIAGNOSIS — E785 Hyperlipidemia, unspecified: Secondary | ICD-10-CM | POA: Diagnosis not present

## 2018-01-26 DIAGNOSIS — M545 Low back pain: Secondary | ICD-10-CM | POA: Diagnosis not present

## 2018-01-26 DIAGNOSIS — Z9181 History of falling: Secondary | ICD-10-CM | POA: Diagnosis not present

## 2018-01-26 DIAGNOSIS — G214 Vascular parkinsonism: Secondary | ICD-10-CM | POA: Diagnosis not present

## 2018-01-26 DIAGNOSIS — Z8546 Personal history of malignant neoplasm of prostate: Secondary | ICD-10-CM | POA: Diagnosis not present

## 2018-01-26 DIAGNOSIS — Z87891 Personal history of nicotine dependence: Secondary | ICD-10-CM | POA: Diagnosis not present

## 2018-01-31 DIAGNOSIS — I350 Nonrheumatic aortic (valve) stenosis: Secondary | ICD-10-CM | POA: Diagnosis not present

## 2018-01-31 DIAGNOSIS — M545 Low back pain: Secondary | ICD-10-CM | POA: Diagnosis not present

## 2018-01-31 DIAGNOSIS — G214 Vascular parkinsonism: Secondary | ICD-10-CM | POA: Diagnosis not present

## 2018-01-31 DIAGNOSIS — E785 Hyperlipidemia, unspecified: Secondary | ICD-10-CM | POA: Diagnosis not present

## 2018-01-31 DIAGNOSIS — Z8701 Personal history of pneumonia (recurrent): Secondary | ICD-10-CM | POA: Diagnosis not present

## 2018-01-31 DIAGNOSIS — Z87440 Personal history of urinary (tract) infections: Secondary | ICD-10-CM | POA: Diagnosis not present

## 2018-01-31 DIAGNOSIS — Z9181 History of falling: Secondary | ICD-10-CM | POA: Diagnosis not present

## 2018-01-31 DIAGNOSIS — H4010X0 Unspecified open-angle glaucoma, stage unspecified: Secondary | ICD-10-CM | POA: Diagnosis not present

## 2018-01-31 DIAGNOSIS — Z87891 Personal history of nicotine dependence: Secondary | ICD-10-CM | POA: Diagnosis not present

## 2018-01-31 DIAGNOSIS — Z8546 Personal history of malignant neoplasm of prostate: Secondary | ICD-10-CM | POA: Diagnosis not present

## 2018-02-02 DIAGNOSIS — E785 Hyperlipidemia, unspecified: Secondary | ICD-10-CM | POA: Diagnosis not present

## 2018-02-02 DIAGNOSIS — Z87440 Personal history of urinary (tract) infections: Secondary | ICD-10-CM | POA: Diagnosis not present

## 2018-02-02 DIAGNOSIS — Z8701 Personal history of pneumonia (recurrent): Secondary | ICD-10-CM | POA: Diagnosis not present

## 2018-02-02 DIAGNOSIS — Z9181 History of falling: Secondary | ICD-10-CM | POA: Diagnosis not present

## 2018-02-02 DIAGNOSIS — H4010X0 Unspecified open-angle glaucoma, stage unspecified: Secondary | ICD-10-CM | POA: Diagnosis not present

## 2018-02-02 DIAGNOSIS — I350 Nonrheumatic aortic (valve) stenosis: Secondary | ICD-10-CM | POA: Diagnosis not present

## 2018-02-02 DIAGNOSIS — Z87891 Personal history of nicotine dependence: Secondary | ICD-10-CM | POA: Diagnosis not present

## 2018-02-02 DIAGNOSIS — M545 Low back pain: Secondary | ICD-10-CM | POA: Diagnosis not present

## 2018-02-02 DIAGNOSIS — Z8546 Personal history of malignant neoplasm of prostate: Secondary | ICD-10-CM | POA: Diagnosis not present

## 2018-02-02 DIAGNOSIS — G214 Vascular parkinsonism: Secondary | ICD-10-CM | POA: Diagnosis not present

## 2018-02-06 ENCOUNTER — Ambulatory Visit: Payer: Medicare HMO | Admitting: Physical Therapy

## 2018-02-07 DIAGNOSIS — Z8546 Personal history of malignant neoplasm of prostate: Secondary | ICD-10-CM | POA: Diagnosis not present

## 2018-02-07 DIAGNOSIS — Z87440 Personal history of urinary (tract) infections: Secondary | ICD-10-CM | POA: Diagnosis not present

## 2018-02-07 DIAGNOSIS — H4010X0 Unspecified open-angle glaucoma, stage unspecified: Secondary | ICD-10-CM | POA: Diagnosis not present

## 2018-02-07 DIAGNOSIS — Z87891 Personal history of nicotine dependence: Secondary | ICD-10-CM | POA: Diagnosis not present

## 2018-02-07 DIAGNOSIS — G214 Vascular parkinsonism: Secondary | ICD-10-CM | POA: Diagnosis not present

## 2018-02-07 DIAGNOSIS — Z8701 Personal history of pneumonia (recurrent): Secondary | ICD-10-CM | POA: Diagnosis not present

## 2018-02-07 DIAGNOSIS — I350 Nonrheumatic aortic (valve) stenosis: Secondary | ICD-10-CM | POA: Diagnosis not present

## 2018-02-07 DIAGNOSIS — E785 Hyperlipidemia, unspecified: Secondary | ICD-10-CM | POA: Diagnosis not present

## 2018-02-07 DIAGNOSIS — Z9181 History of falling: Secondary | ICD-10-CM | POA: Diagnosis not present

## 2018-02-07 DIAGNOSIS — M545 Low back pain: Secondary | ICD-10-CM | POA: Diagnosis not present

## 2018-02-09 DIAGNOSIS — H4010X0 Unspecified open-angle glaucoma, stage unspecified: Secondary | ICD-10-CM | POA: Diagnosis not present

## 2018-02-09 DIAGNOSIS — Z9181 History of falling: Secondary | ICD-10-CM | POA: Diagnosis not present

## 2018-02-09 DIAGNOSIS — I350 Nonrheumatic aortic (valve) stenosis: Secondary | ICD-10-CM | POA: Diagnosis not present

## 2018-02-09 DIAGNOSIS — M545 Low back pain: Secondary | ICD-10-CM | POA: Diagnosis not present

## 2018-02-09 DIAGNOSIS — E785 Hyperlipidemia, unspecified: Secondary | ICD-10-CM | POA: Diagnosis not present

## 2018-02-09 DIAGNOSIS — Z87891 Personal history of nicotine dependence: Secondary | ICD-10-CM | POA: Diagnosis not present

## 2018-02-09 DIAGNOSIS — Z8701 Personal history of pneumonia (recurrent): Secondary | ICD-10-CM | POA: Diagnosis not present

## 2018-02-09 DIAGNOSIS — G214 Vascular parkinsonism: Secondary | ICD-10-CM | POA: Diagnosis not present

## 2018-02-09 DIAGNOSIS — Z87440 Personal history of urinary (tract) infections: Secondary | ICD-10-CM | POA: Diagnosis not present

## 2018-02-09 DIAGNOSIS — Z8546 Personal history of malignant neoplasm of prostate: Secondary | ICD-10-CM | POA: Diagnosis not present

## 2018-02-10 ENCOUNTER — Encounter: Payer: Medicare HMO | Admitting: Physical Therapy

## 2018-02-10 DIAGNOSIS — R2689 Other abnormalities of gait and mobility: Secondary | ICD-10-CM | POA: Diagnosis not present

## 2018-02-10 DIAGNOSIS — Z6823 Body mass index (BMI) 23.0-23.9, adult: Secondary | ICD-10-CM | POA: Diagnosis not present

## 2018-02-10 DIAGNOSIS — I350 Nonrheumatic aortic (valve) stenosis: Secondary | ICD-10-CM | POA: Diagnosis not present

## 2018-02-10 DIAGNOSIS — G214 Vascular parkinsonism: Secondary | ICD-10-CM | POA: Diagnosis not present

## 2018-02-11 DIAGNOSIS — R69 Illness, unspecified: Secondary | ICD-10-CM | POA: Diagnosis not present

## 2018-02-14 DIAGNOSIS — E785 Hyperlipidemia, unspecified: Secondary | ICD-10-CM | POA: Diagnosis not present

## 2018-02-14 DIAGNOSIS — Z87891 Personal history of nicotine dependence: Secondary | ICD-10-CM | POA: Diagnosis not present

## 2018-02-14 DIAGNOSIS — Z8546 Personal history of malignant neoplasm of prostate: Secondary | ICD-10-CM | POA: Diagnosis not present

## 2018-02-14 DIAGNOSIS — G214 Vascular parkinsonism: Secondary | ICD-10-CM | POA: Diagnosis not present

## 2018-02-14 DIAGNOSIS — Z8701 Personal history of pneumonia (recurrent): Secondary | ICD-10-CM | POA: Diagnosis not present

## 2018-02-14 DIAGNOSIS — M545 Low back pain: Secondary | ICD-10-CM | POA: Diagnosis not present

## 2018-02-14 DIAGNOSIS — H4010X0 Unspecified open-angle glaucoma, stage unspecified: Secondary | ICD-10-CM | POA: Diagnosis not present

## 2018-02-14 DIAGNOSIS — Z9181 History of falling: Secondary | ICD-10-CM | POA: Diagnosis not present

## 2018-02-14 DIAGNOSIS — Z87440 Personal history of urinary (tract) infections: Secondary | ICD-10-CM | POA: Diagnosis not present

## 2018-02-14 DIAGNOSIS — I350 Nonrheumatic aortic (valve) stenosis: Secondary | ICD-10-CM | POA: Diagnosis not present

## 2018-02-15 ENCOUNTER — Telehealth: Payer: Self-pay | Admitting: Neurology

## 2018-02-15 NOTE — Telephone Encounter (Signed)
Patient does not want to spend money on another prescription.

## 2018-02-15 NOTE — Telephone Encounter (Signed)
Change him to carbidopa/levodopa 25/100 CR instead of IR that he has and still have him take it tid and see if that helps the drowsy

## 2018-02-15 NOTE — Telephone Encounter (Signed)
Patient said that he is taking his carb/levo at 6 am, noon and 6 pm and it is making him very sleepy and drowsy.  He would like to cut back to taking it only at 6 am and 6 pm.  Please advise.

## 2018-02-15 NOTE — Telephone Encounter (Signed)
Patient is calling in stating that he needs to speak with a nurse. Please call back at [phone]. Thanks!

## 2018-02-16 DIAGNOSIS — H4010X0 Unspecified open-angle glaucoma, stage unspecified: Secondary | ICD-10-CM | POA: Diagnosis not present

## 2018-02-16 DIAGNOSIS — Z8701 Personal history of pneumonia (recurrent): Secondary | ICD-10-CM | POA: Diagnosis not present

## 2018-02-16 DIAGNOSIS — Z8546 Personal history of malignant neoplasm of prostate: Secondary | ICD-10-CM | POA: Diagnosis not present

## 2018-02-16 DIAGNOSIS — M545 Low back pain: Secondary | ICD-10-CM | POA: Diagnosis not present

## 2018-02-16 DIAGNOSIS — E785 Hyperlipidemia, unspecified: Secondary | ICD-10-CM | POA: Diagnosis not present

## 2018-02-16 DIAGNOSIS — I350 Nonrheumatic aortic (valve) stenosis: Secondary | ICD-10-CM | POA: Diagnosis not present

## 2018-02-16 DIAGNOSIS — Z87891 Personal history of nicotine dependence: Secondary | ICD-10-CM | POA: Diagnosis not present

## 2018-02-16 DIAGNOSIS — Z87440 Personal history of urinary (tract) infections: Secondary | ICD-10-CM | POA: Diagnosis not present

## 2018-02-16 DIAGNOSIS — Z9181 History of falling: Secondary | ICD-10-CM | POA: Diagnosis not present

## 2018-02-16 DIAGNOSIS — G214 Vascular parkinsonism: Secondary | ICD-10-CM | POA: Diagnosis not present

## 2018-02-16 MED ORDER — CARBIDOPA-LEVODOPA ER 25-100 MG PO TBCR: 1.0000 | EXTENDED_RELEASE_TABLET | Freq: Three times a day (TID) | ORAL | 0 refills | Status: DC

## 2018-02-16 NOTE — Telephone Encounter (Signed)
I guess that it his choice, although these are generally not expensive RX.  They don't work on a bid basis though

## 2018-02-16 NOTE — Telephone Encounter (Signed)
Spoke with patient. He decided to try the CR version. RX sent to pharmacy.

## 2018-02-20 ENCOUNTER — Encounter: Payer: Medicare HMO | Admitting: Physical Therapy

## 2018-03-02 NOTE — Progress Notes (Deleted)
Jeffrey Rankins, MD Reason for referral-aortic stenosis  HPI: 84 year old male for evaluation of aortic stenosis at request of [name], MD.  Previously followed at [facility]. Echocardiogram August 2019 showed vigorous LV function, mild diastolic dysfunction, moderate to severe aortic stenosis with mean gradient 37 mmHg, mild to moderate aortic insufficiency and mild tricuspid regurgitation.  Abdominal CT August 2019 showed no aneurysm.  Patient also with known elevated liver functions.  Current Outpatient Medications  Medication Sig Dispense Refill  . acetaminophen (TYLENOL) 500 MG tablet Take 500 mg by mouth daily as needed for moderate pain.    Marland Kitchen amLODipine (NORVASC) 5 MG tablet Take 1 tablet (5 mg total) by mouth daily. 30 tablet 0  . Carbidopa-Levodopa ER (SINEMET CR) 25-100 MG tablet controlled release Take 1 tablet by mouth 3 (three) times daily. 90 tablet 0  . Cholecalciferol (VITAMIN D) 2000 units tablet Take 2,000 Units by mouth daily.    Marland Kitchen latanoprost (XALATAN) 0.005 % ophthalmic solution Place 1 drop into both eyes at bedtime.     . LUTEIN-ZEAXANTHIN PO Take 1 tablet by mouth daily.    . Magnesium 250 MG TABS Take 250 mg by mouth daily.    . Multiple Vitamins-Minerals (HAIR SKIN AND NAILS FORMULA PO) Take by mouth.    . niacin 100 MG tablet Take 100 mg by mouth at bedtime.     No current facility-administered medications for this visit.     Allergies  Allergen Reactions  . Timolol Maleate Other (See Comments)    Syncope     Past Medical History:  Diagnosis Date  . Glaucoma   . Hypertension   . Prostate cancer [facility])     Past Surgical History:  Procedure Laterality Date  . CATARACT EXTRACTION    . HERNIA REPAIR    . INSERTION BRACHYTHERAPY DEVICE      Social History   Socioeconomic History  . Marital status: Widowed    Spouse name: Not on file  . Number of children: Not on file  . Years of education: Not on file  . Highest education level: Not on  file  Occupational History  . 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 Financial resource strain: Not on file  . Food insecurity:    Worry: Not on file    Inability: Not on file  . Transportation needs:    Medical: Not on file    Non-medical: Not on file  Tobacco Use  . Smoking status: Former Smoker    Types: Cigarettes  . Smokeless tobacco: Never Used  Substance and Sexual Activity  . Alcohol use: Yes    Alcohol/week: 1.0 standard drinks    Types: 1 Cans of beer per week    Comment: rarely - now living with daughter and she "has a dry house"  . Drug use: Not on file  . Sexual activity: Not on file  Lifestyle  . Physical activity:    Days per week: Not on file    Minutes per session: Not on file  . Stress: Not on file  Relationships  . Social connections:    Talks on phone: Not on file    Gets together: Not on file    Attends religious service: Not on file    Active member of club or organization: Not on file    Attends meetings of clubs or organizations: Not on file    Relationship status: Not on file  . Intimate partner  violence:    Fear of current or ex partner: Not on file    Emotionally abused: Not on file    Physically abused: Not on file    Forced sexual activity: Not on file  Other Topics Concern  . Not on file  Social History Narrative  . Not on file    Family History  Problem Relation Age of Onset  . Cancer Mother   . Cancer Father     ROS: no fevers or chills, productive cough, hemoptysis, dysphasia, odynophagia, melena, hematochezia, dysuria, hematuria, rash, seizure activity, orthopnea, PND, pedal edema, claudication. Remaining systems are negative.  Physical Exam:   There were no vitals taken for this visit.  General:  Well developed/well nourished in NAD Skin warm/dry Patient not depressed No peripheral clubbing Back-normal HEENT-normal/normal eyelids Neck supple/normal carotid upstroke bilaterally;  no bruits; no JVD; no thyromegaly chest - CTA/ normal expansion CV - RRR/normal S1 and S2; no murmurs, rubs or gallops;  PMI nondisplaced Abdomen -NT/ND, no HSM, no mass, + bowel sounds, no bruit 2+ femoral pulses, no bruits Ext-no edema, chords, 2+ DP Neuro-grossly nonfocal  ECG - personally reviewed  A/P  1  Jeffrey Ruths, MD

## 2018-03-12 ENCOUNTER — Other Ambulatory Visit: Payer: Self-pay | Admitting: Neurology

## 2018-03-14 ENCOUNTER — Ambulatory Visit: Payer: Medicare HMO | Admitting: Cardiology

## 2018-04-03 ENCOUNTER — Other Ambulatory Visit: Payer: Self-pay | Admitting: Neurology

## 2018-04-03 ENCOUNTER — Telehealth: Payer: Self-pay | Admitting: Neurology

## 2018-04-03 NOTE — Telephone Encounter (Signed)
Patient is needing a refill of the carbidopa levodopa extended release to the [facility] 30 day supply please! Thanks!

## 2018-04-03 NOTE — Telephone Encounter (Signed)
RX sent to pharmacy  

## 2018-04-27 NOTE — Progress Notes (Deleted)
Jeffrey Rankins MD Reason for referral-Aortic stenosis  HPI: 85 yo male for evaluation of AS at request of Jeffrey Lass MD. Admitted 8/19 with Kebsiella bacteremia, pneumonia and rhabdomyolysis. Echo 8/19 showed normal LV function; grade 1 DD; moderate to severe AS (mean gradient 37 mmHg); mild to moderate AI; mild TR. Previously followed by [facility] cardiology.  Current Outpatient Medications  Medication Sig Dispense Refill  . acetaminophen (TYLENOL) 500 MG tablet Take 500 mg by mouth daily as needed for moderate pain.    Marland Kitchen amLODipine (NORVASC) 5 MG tablet Take 1 tablet (5 mg total) by mouth daily. 30 tablet 0  . Carbidopa-Levodopa ER (SINEMET CR) 25-100 MG tablet controlled release TAKE 1 TABLET BY MOUTH THREE TIMES A DAY 90 tablet 5  . Carbidopa-Levodopa ER (SINEMET CR) 25-100 MG tablet controlled release TAKE 1 TABLET BY MOUTH THREE TIMES A DAY 90 tablet 2  . Cholecalciferol (VITAMIN D) 2000 units tablet Take 2,000 Units by mouth daily.    Marland Kitchen latanoprost (XALATAN) 0.005 % ophthalmic solution Place 1 drop into both eyes at bedtime.     . LUTEIN-ZEAXANTHIN PO Take 1 tablet by mouth daily.    . Magnesium 250 MG TABS Take 250 mg by mouth daily.    . Multiple Vitamins-Minerals (HAIR SKIN AND NAILS FORMULA PO) Take by mouth.    . niacin 100 MG tablet Take 100 mg by mouth at bedtime.     No current facility-administered medications for this visit.     Allergies  Allergen Reactions  . Timolol Maleate Other (See Comments)    Syncope     Past Medical History:  Diagnosis Date  . Glaucoma   . Hypertension   . Prostate cancer [facility])     Past Surgical History:  Procedure Laterality Date  . CATARACT EXTRACTION    . HERNIA REPAIR    . INSERTION BRACHYTHERAPY DEVICE      Social History   Socioeconomic History  . Marital status: Widowed    Spouse name: Not on file  . Number of children: Not on file  . Years of education: Not on file  . Highest education level: Not on file    Occupational History  . 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 Financial resource strain: Not on file  . Food insecurity:    Worry: Not on file    Inability: Not on file  . Transportation needs:    Medical: Not on file    Non-medical: Not on file  Tobacco Use  . Smoking status: Former Smoker    Types: Cigarettes  . Smokeless tobacco: Never Used  Substance and Sexual Activity  . Alcohol use: Yes    Alcohol/week: 1.0 standard drinks    Types: 1 Cans of beer per week    Comment: rarely - now living with daughter and she "has a dry house"  . Drug use: Not on file  . Sexual activity: Not on file  Lifestyle  . Physical activity:    Days per week: Not on file    Minutes per session: Not on file  . Stress: Not on file  Relationships  . Social connections:    Talks on phone: Not on file    Gets together: Not on file    Attends religious service: Not on file    Active member of club or organization: Not on file    Attends meetings of clubs or organizations: Not on file  Relationship status: Not on file  . Intimate partner violence:    Fear of current or ex partner: Not on file    Emotionally abused: Not on file    Physically abused: Not on file    Forced sexual activity: Not on file  Other Topics Concern  . Not on file  Social History Narrative  . Not on file    Family History  Problem Relation Age of Onset  . Cancer Mother   . Cancer Father     ROS: no fevers or chills, productive cough, hemoptysis, dysphasia, odynophagia, melena, hematochezia, dysuria, hematuria, rash, seizure activity, orthopnea, PND, pedal edema, claudication. Remaining systems are negative.  Physical Exam:   There were no vitals taken for this visit.  General:  Well developed/well nourished in NAD Skin warm/dry Patient not depressed No peripheral clubbing Back-normal HEENT-normal/normal eyelids Neck supple/normal carotid upstroke bilaterally; no  bruits; no JVD; no thyromegaly chest - CTA/ normal expansion CV - RRR/normal S1 and S2; no murmurs, rubs or gallops;  PMI nondisplaced Abdomen -NT/ND, no HSM, no mass, + bowel sounds, no bruit 2+ femoral pulses, no bruits Ext-no edema, chords, 2+ DP Neuro-grossly nonfocal  ECG - personally reviewed  A/P  1  Jeffrey Ruths, MD

## 2018-05-08 ENCOUNTER — Ambulatory Visit: Payer: Medicare HMO | Admitting: Cardiology

## 2018-05-18 NOTE — Progress Notes (Signed)
Jeffrey Porter was seen today in the movement disorders clinic for follow-up of vascular parkinsonism.  This patient is accompanied in the office by his child who supplements the history.He wanted to try medication last visit.  He was started on levodopa, knowing that this generally does not work well for vascular parkinsonism.  He did call me about a month later because he was sleepy.  I changed him from carbidopa/levodopa 25/100 IR to CR.   today, he states that he stopped medication a week ago and feels that he is doing so much better off of the medication.  Reports that the medication caused trouble walking and "put me on automatic brakes."  Found out that if he locks his knee while walking, he walks better so thinks that it is his knee causing his walking issues.  States that carbidopa levodopa caused lower back pain, trouble swallowing, dry mouth, leg swelling, muscle cramps, runny nose, change in taste, blurred vision.  States that he read the side effects on the [facility] website and feels that he had all of them.  PREVIOUS MEDICATIONS: none to date  ALLERGIES:   Allergies  Allergen Reactions  . Timolol Maleate Other (See Comments)    Syncope     CURRENT MEDICATIONS:  Outpatient Encounter Medications as of 05/19/2018  Medication Sig  . acetaminophen (TYLENOL) 500 MG tablet Take 500 mg by mouth daily as needed for moderate pain.  . Cholecalciferol (VITAMIN D) 2000 units tablet Take 2,000 Units by mouth daily.  Marland Kitchen latanoprost (XALATAN) 0.005 % ophthalmic solution Place 1 drop into both eyes at bedtime.   . LUTEIN-ZEAXANTHIN PO Take 1 tablet by mouth daily.  . Magnesium 250 MG TABS Take 250 mg by mouth daily.  . Multiple Vitamins-Minerals (HAIR SKIN AND NAILS FORMULA PO) Take by mouth.  . niacin 100 MG tablet Take 100 mg by mouth at bedtime.  . Carbidopa-Levodopa ER (SINEMET CR) 25-100 MG tablet controlled release TAKE 1 TABLET BY MOUTH THREE TIMES A DAY (Patient not taking: Reported on  05/19/2018)  . [DISCONTINUED] amLODipine (NORVASC) 5 MG tablet Take 1 tablet (5 mg total) by mouth daily.  . [DISCONTINUED] Carbidopa-Levodopa ER (SINEMET CR) 25-100 MG tablet controlled release TAKE 1 TABLET BY MOUTH THREE TIMES A DAY   No facility-administered encounter medications on file as of 05/19/2018.     PAST MEDICAL HISTORY:   Past Medical History:  Diagnosis Date  . Glaucoma   . Hypertension   . Prostate cancer ([place])     PAST SURGICAL HISTORY:   Past Surgical History:  Procedure Laterality Date  . CATARACT EXTRACTION    . HERNIA REPAIR    . INSERTION BRACHYTHERAPY DEVICE      SOCIAL HISTORY:   Social History   Socioeconomic History  . Marital status: Widowed    Spouse name: Not on file  . Number of children: Not on file  . Years of education: Not on file  . Highest education level: Not on file  Occupational History  . 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 Financial resource strain: Not on file  . Food insecurity:    Worry: Not on file    Inability: Not on file  . Transportation needs:    Medical: Not on file    Non-medical: Not on file  Tobacco Use  . Smoking status: Former Smoker    Types: Cigarettes  . Smokeless tobacco: Never Used  Substance and Sexual Activity  .  Alcohol use: Yes    Alcohol/week: 1.0 standard drinks    Types: 1 Cans of beer per week    Comment: rarely - now living with daughter and she "has a dry house"  . Drug use: Not on file  . Sexual activity: Not on file  Lifestyle  . Physical activity:    Days per week: Not on file    Minutes per session: Not on file  . Stress: Not on file  Relationships  . Social connections:    Talks on phone: Not on file    Gets together: Not on file    Attends religious service: Not on file    Active member of club or organization: Not on file    Attends meetings of clubs or organizations: Not on file    Relationship status: Not on file  . Intimate  partner violence:    Fear of current or ex partner: Not on file    Emotionally abused: Not on file    Physically abused: Not on file    Forced sexual activity: Not on file  Other Topics Concern  . Not on file  Social History Narrative  . Not on file    FAMILY HISTORY:   Family Status  Relation Name Status  . Mother  Deceased  . Father  Deceased  . Sister  Deceased  . Brother 2 Alive  . Daughter 2 Alive    ROS:  ROS  PHYSICAL EXAMINATION:    VITALS:   Vitals:   05/19/18 1520  BP: (!) 156/82  Pulse: 96  SpO2: 99%  Weight: 142 lb (64.4 kg)  Height: 5\' 3"  (1.6 m)    GEN:  The patient appears stated age and is in NAD. HEENT:  Normocephalic, atraumatic.  The mucous membranes are moist. The superficial temporal arteries are without ropiness or tenderness. CV:  RRR Lungs:  CTAB Neck/HEME:  There are no carotid bruits bilaterally.  Neurological examination:  Orientation: The patient is alert and oriented x3. Cranial nerves: There is good facial symmetry. The speech is fluent and clear. Soft palate rises symmetrically and there is no tongue deviation. Hearing is intact to conversational tone. Sensation: Sensation is intact to light touch throughout Motor: Strength is 5/5 in the bilateral upper and lower extremities.   Shoulder shrug is equal and symmetric.  There is no pronator drift.   Movement examination: Tone: There is normal tone today in the upper and lower extremities. Abnormal movements: there is no rest tremor.   There is jaw tremor when distracted with other tasks Coordination:  There is decremation with RAM's, only with toe taps on the left.  All other RAMs are normal Gait and Station: The patient has no difficulty arising out of a deep-seated chair without the use of the hands.  Patient has significant start hesitation.  He has freezing in the doorways.  Once out in the hallway, he purposely tries to take long steps and does fairly well until he gets in the turn  and then freezes again.  He is given a walker and does somewhat better with that, until he hits the doorway again and then freezes.    Chemistry      Component Value Date/Time   NA 135 12/23/2017 0358   K 3.7 12/23/2017 0358   CL 101 12/23/2017 0358   CO2 24 12/23/2017 0358   BUN 12 12/23/2017 0358   CREATININE 0.54 (L) 12/23/2017 0358      Component Value Date/Time  CALCIUM 8.7 (L) 12/23/2017 0358   ALKPHOS 283 (H) 12/23/2017 0358   AST 61 (H) 12/23/2017 0358   ALT 152 (H) 12/23/2017 0358   BILITOT 1.3 (H) 12/23/2017 0358     Lab Results  Component Value Date   TSH 0.861 12/06/2017     ASSESSMENT/PLAN:  1.  Vascular Parkinsonism  -Once again discussed with the patient that this is likely vascular parkinsonism.  I did recommend neuro imaging, particularly a CT of the brain to rule out NPH (brother apparently has hydrocephalous of some type).  The patient was very adamant that he was not going to have any neuro imaging.  He states that he does not agree with the diagnosis and agreed with "the first neurologist I saw, that this is just arthritis."  I explained to the patient that he may have arthritis, but that would not cause freezing of gait.  Despite this, the patient refused any therapies or any neuroimaging.  Patient wishes to follow-up with [name].  He will be discharged back to her full care. Much greater than 50% of this visit was spent in counseling and coordinating care.  Total face to face time:  25 min  Cc:  [name], MD

## 2018-05-19 ENCOUNTER — Encounter: Payer: Self-pay | Admitting: Neurology

## 2018-05-19 ENCOUNTER — Ambulatory Visit: Payer: Medicare HMO | Admitting: Neurology

## 2018-05-19 VITALS — BP 156/82 | HR 96 | Ht 63.0 in | Wt 142.0 lb

## 2018-05-19 DIAGNOSIS — G214 Vascular parkinsonism: Secondary | ICD-10-CM | POA: Diagnosis not present

## 2018-05-24 ENCOUNTER — Telehealth: Payer: Self-pay | Admitting: Neurology

## 2018-05-24 NOTE — Telephone Encounter (Signed)
Patient called regarding a Walker. He is needing the Brand Name as well as more Information. Please Call. Thanks

## 2018-05-25 NOTE — Telephone Encounter (Signed)
No.  He used it in the hall when I walked him but was quite adament with me that it was the same as his other walker, that he didn't have vascular parkinsonism and that he would be returning to his other neurologist for care

## 2018-05-25 NOTE — Telephone Encounter (Signed)
Did you discuss [employer] walker with patient?

## 2018-05-25 NOTE — Telephone Encounter (Signed)
Spoke with patient. He is interested in [employer] walker. I advised I would send him a brochure in the mail, but if he would like to move forward with it he would need to talk to his treating neurologist about getting this prescription. He expressed understanding/appreciation. Brochure mailed.

## 2018-08-21 DIAGNOSIS — R600 Localized edema: Secondary | ICD-10-CM | POA: Diagnosis not present

## 2018-10-17 DIAGNOSIS — H524 Presbyopia: Secondary | ICD-10-CM | POA: Diagnosis not present

## 2018-10-17 DIAGNOSIS — Z961 Presence of intraocular lens: Secondary | ICD-10-CM | POA: Diagnosis not present

## 2018-10-17 DIAGNOSIS — H5203 Hypermetropia, bilateral: Secondary | ICD-10-CM | POA: Diagnosis not present

## 2018-10-17 DIAGNOSIS — H401134 Primary open-angle glaucoma, bilateral, indeterminate stage: Secondary | ICD-10-CM | POA: Diagnosis not present

## 2018-10-17 DIAGNOSIS — H52223 Regular astigmatism, bilateral: Secondary | ICD-10-CM | POA: Diagnosis not present

## 2018-10-30 DIAGNOSIS — Z0100 Encounter for examination of eyes and vision without abnormal findings: Secondary | ICD-10-CM | POA: Diagnosis not present

## 2018-12-05 DIAGNOSIS — G909 Disorder of the autonomic nervous system, unspecified: Secondary | ICD-10-CM | POA: Diagnosis not present

## 2018-12-05 DIAGNOSIS — N529 Male erectile dysfunction, unspecified: Secondary | ICD-10-CM | POA: Diagnosis not present

## 2018-12-05 DIAGNOSIS — H409 Unspecified glaucoma: Secondary | ICD-10-CM | POA: Diagnosis not present

## 2018-12-05 DIAGNOSIS — Z87891 Personal history of nicotine dependence: Secondary | ICD-10-CM | POA: Diagnosis not present

## 2018-12-05 DIAGNOSIS — M1990 Unspecified osteoarthritis, unspecified site: Secondary | ICD-10-CM | POA: Diagnosis not present

## 2018-12-05 DIAGNOSIS — Z833 Family history of diabetes mellitus: Secondary | ICD-10-CM | POA: Diagnosis not present

## 2018-12-05 DIAGNOSIS — Z8546 Personal history of malignant neoplasm of prostate: Secondary | ICD-10-CM | POA: Diagnosis not present

## 2018-12-13 DIAGNOSIS — R600 Localized edema: Secondary | ICD-10-CM | POA: Diagnosis not present

## 2019-01-22 DIAGNOSIS — Z6826 Body mass index (BMI) 26.0-26.9, adult: Secondary | ICD-10-CM | POA: Diagnosis not present

## 2019-01-22 DIAGNOSIS — R2689 Other abnormalities of gait and mobility: Secondary | ICD-10-CM | POA: Diagnosis not present

## 2019-01-22 DIAGNOSIS — I350 Nonrheumatic aortic (valve) stenosis: Secondary | ICD-10-CM | POA: Diagnosis not present

## 2019-01-22 DIAGNOSIS — R600 Localized edema: Secondary | ICD-10-CM | POA: Diagnosis not present

## 2019-02-17 DIAGNOSIS — R69 Illness, unspecified: Secondary | ICD-10-CM | POA: Diagnosis not present

## 2019-03-10 DIAGNOSIS — R319 Hematuria, unspecified: Secondary | ICD-10-CM | POA: Diagnosis not present

## 2019-03-10 DIAGNOSIS — R300 Dysuria: Secondary | ICD-10-CM | POA: Diagnosis not present

## 2019-03-12 DIAGNOSIS — H40013 Open angle with borderline findings, low risk, bilateral: Secondary | ICD-10-CM | POA: Diagnosis not present

## 2019-03-12 DIAGNOSIS — H33331 Multiple defects of retina without detachment, right eye: Secondary | ICD-10-CM | POA: Diagnosis not present

## 2019-03-12 DIAGNOSIS — R69 Illness, unspecified: Secondary | ICD-10-CM | POA: Diagnosis not present

## 2019-03-23 IMAGING — CT CT ABD-PELV W/ CM
2 of 5 series · 16 of 46 positions shown, 18 images · IV contrast (APPLIED)
Comparison: Abdomen ultrasound and chest CT obtained yesterday.

CLINICAL DATA: Abdominal pain yesterday. Sepsis. Leukocytosis.

EXAM:
CT ABDOMEN AND PELVIS WITH CONTRAST
TECHNIQUE: Multidetector CT imaging of the abdomen and pelvis was performed
using the standard protocol following bolus administration of
intravenous contrast.
CONTRAST:  100mL OMNIPAQUE IOHEXOL 300 MG/ML SOLN, 30mL F1BSF4-5WW
IOPAMIDOL (F1BSF4-5WW) INJECTION 61%

[Series 2: axial st · axial · 0.71mm/px · z∈[-560,-185]mm · 13 of 87 slices shown, 15 images]
[im 6/87  soft-tissue]
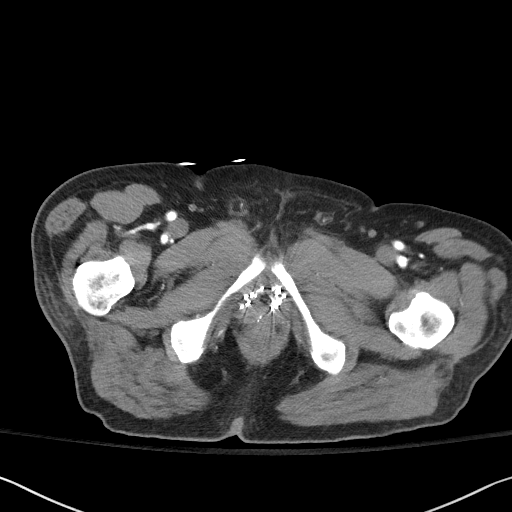
[im 6/87  bone]
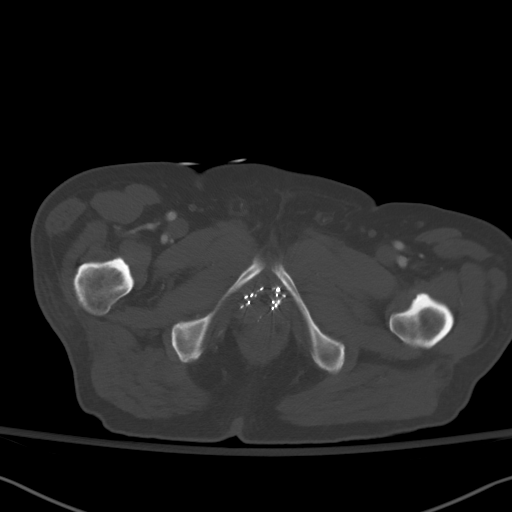
[im 11/87  soft-tissue]
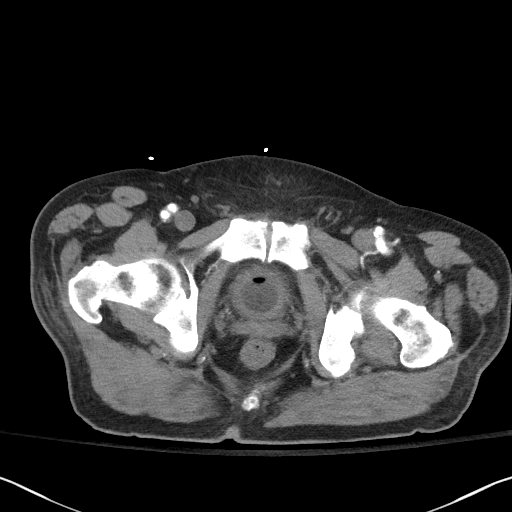
[im 17/87  soft-tissue]
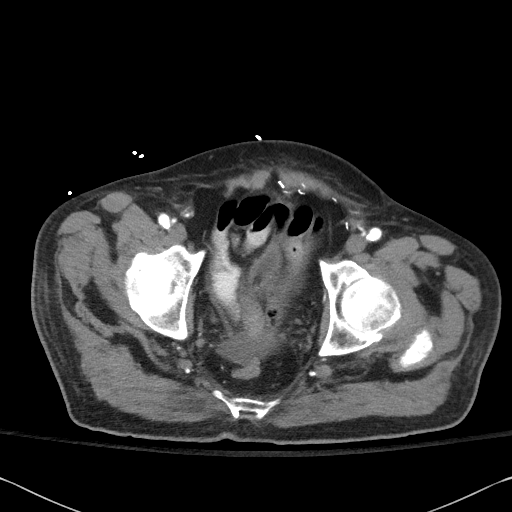
[im 27/87  soft-tissue]
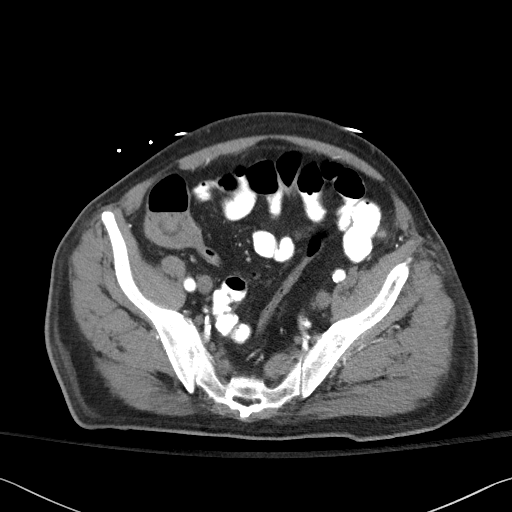
[im 33/87  soft-tissue]
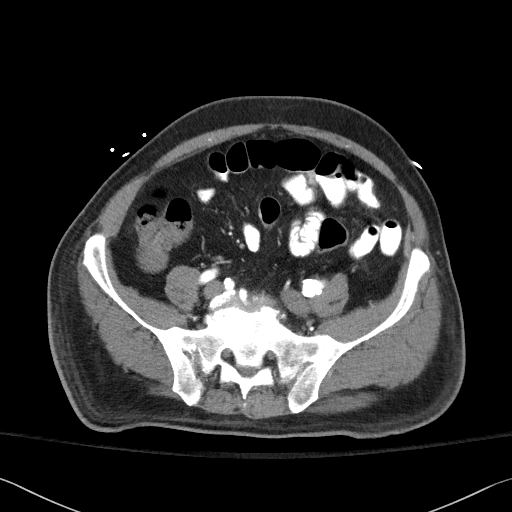
[im 38/87  soft-tissue]
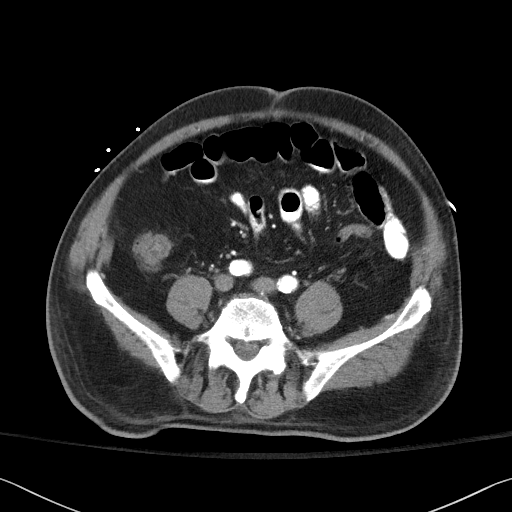
[im 44/87  soft-tissue]
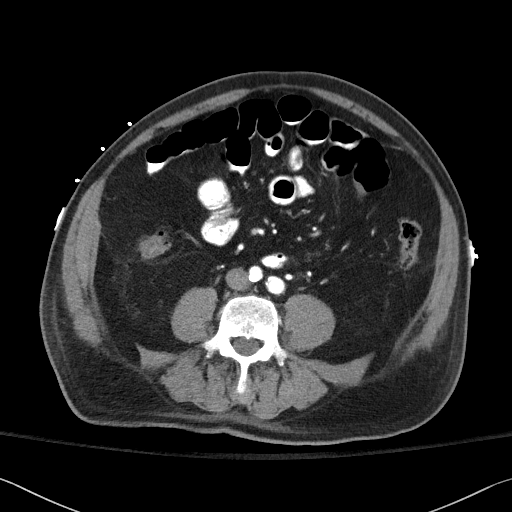
[im 49/87  soft-tissue]
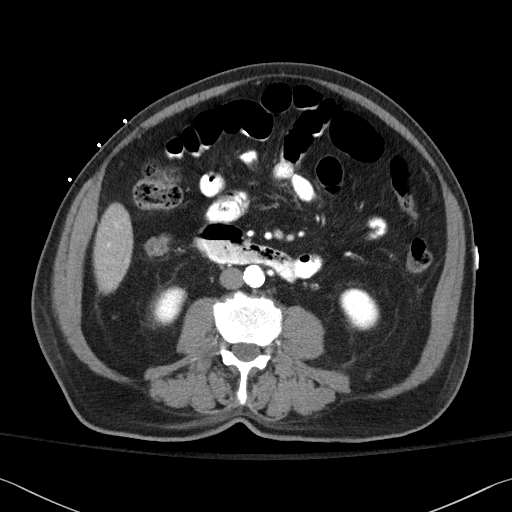
[im 54/87  soft-tissue]
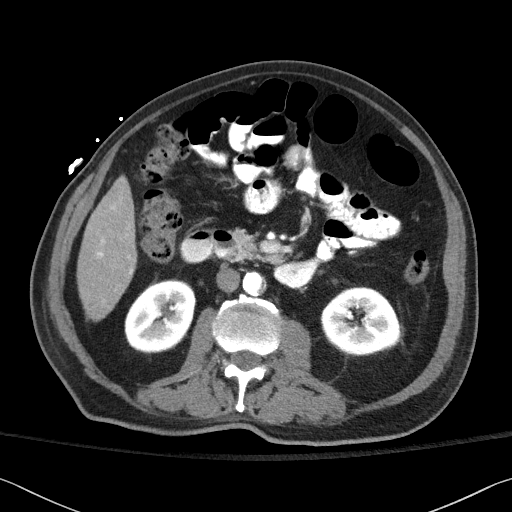
[im 54/87  bone]
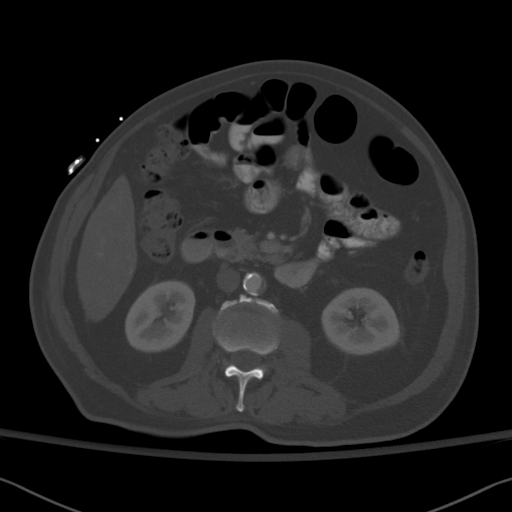
[im 60/87  soft-tissue]
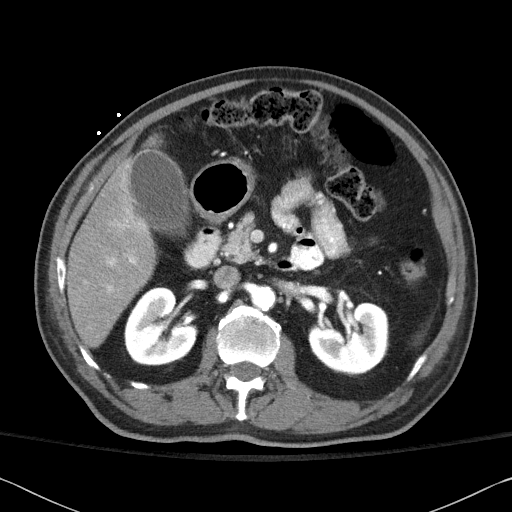
[im 70/87  soft-tissue]
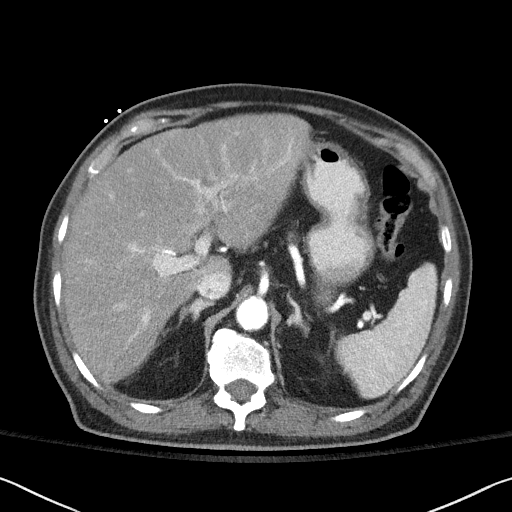
[im 76/87  soft-tissue]
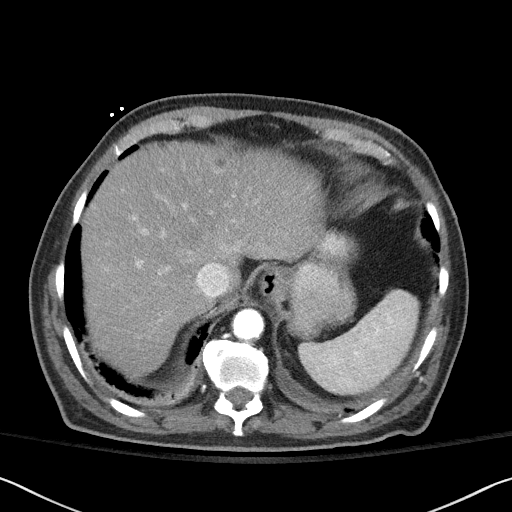
[im 81/87  soft-tissue]
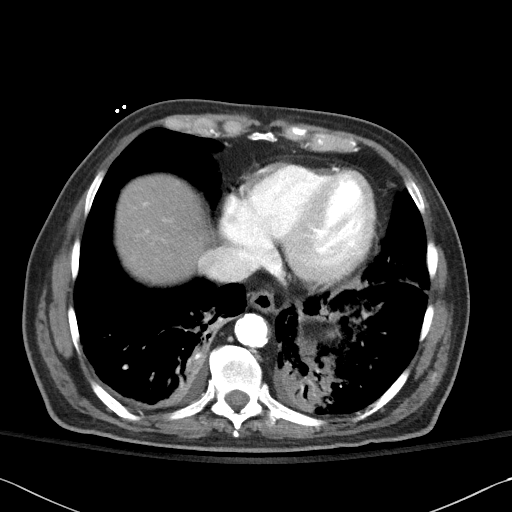

[Series 5: coronal st · coronal · 0.67mm/px · 3 of 93 slices shown]
[im 31/93  soft-tissue]
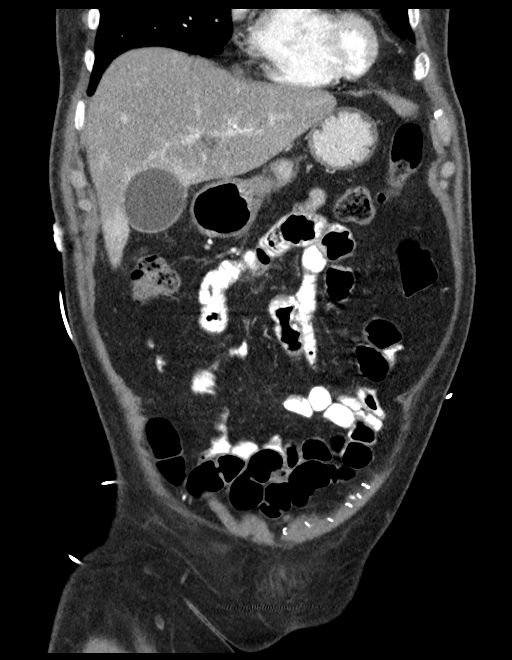
[im 41/93  soft-tissue]
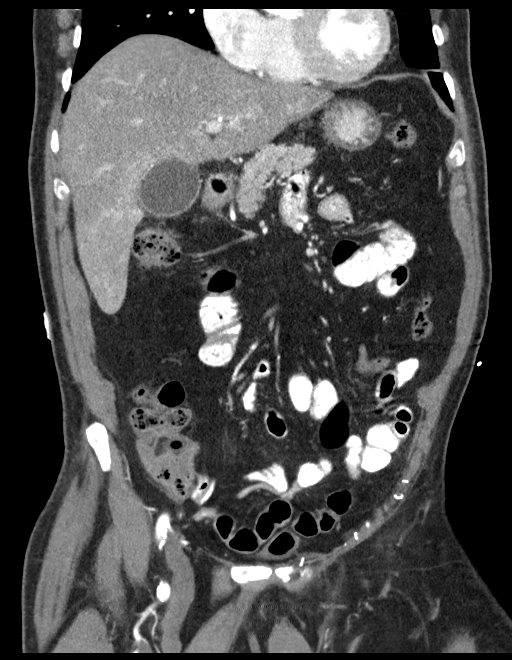
[im 52/93  soft-tissue]
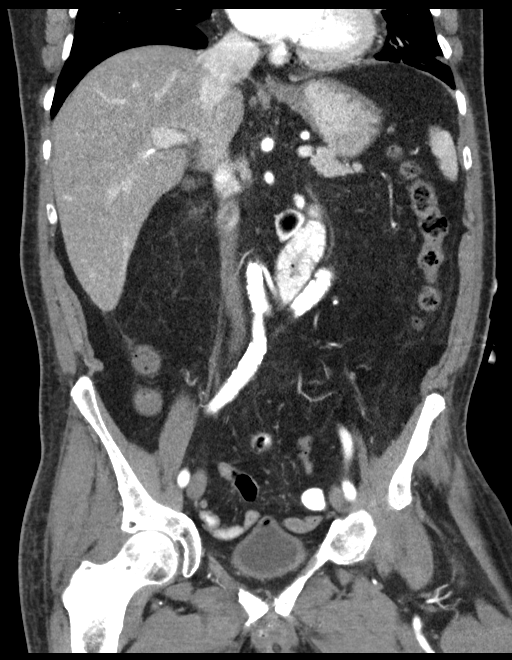

[16 of 46 positions shown; findings below may reference images not displayed]

FINDINGS: Lower chest: Interval minimal bilateral pleural fluid and areas of
dependent consolidation in both lower lobes and lingula.

Hepatobiliary: Diffuse low density of the liver relative to the
spleen. Normal appearing gallbladder.

Pancreas: Unremarkable. No pancreatic ductal dilatation or
surrounding inflammatory changes.

Spleen: Normal in size without focal abnormality.

Adrenals/Urinary Tract: Foley catheter in the urinary bladder with
associated air in the bladder. Normal appearing adrenal glands, a
ureters and left kidney. Tiny area of low density in the
anterolateral aspect of the peripheral cortex of the mid to lower
right kidney. This is too small to characterize.

Stomach/Bowel: Small hiatal hernia. Mildly prominent gas-filled
sigmoid colon in the left mid upper abdomen. Unremarkable small
bowel. No evidence of appendicitis.

Vascular/Lymphatic: Atheromatous arterial calcifications without
aneurysm. No enlarged lymph nodes.

Reproductive: Prostate radiation seed implants.

Other: Left groin surgical clips and left anterior pelvic wall
hernia repair mesh. Small amount of free peritoneal fluid in the
inferior pelvis.

Musculoskeletal: Lumbar and lower thoracic spine degenerative
changes.
IMPRESSION: 1. Interval minimal bilateral pleural fluid and areas of dependent
consolidation in both lower lobes and lingula. This could be due to
pneumonia or atelectasis.
2. Small amount of free peritoneal fluid in the inferior pelvis.
3. Diffuse hepatic steatosis.
4. Tiny area of low density in the right kidney laterally, too small
to characterize.

## 2019-05-17 DIAGNOSIS — R310 Gross hematuria: Secondary | ICD-10-CM | POA: Diagnosis not present

## 2019-05-17 DIAGNOSIS — Z8546 Personal history of malignant neoplasm of prostate: Secondary | ICD-10-CM | POA: Diagnosis not present

## 2019-06-01 DIAGNOSIS — R310 Gross hematuria: Secondary | ICD-10-CM | POA: Diagnosis not present

## 2019-07-26 DIAGNOSIS — N35012 Post-traumatic membranous urethral stricture: Secondary | ICD-10-CM | POA: Diagnosis not present

## 2019-07-26 DIAGNOSIS — N3041 Irradiation cystitis with hematuria: Secondary | ICD-10-CM | POA: Diagnosis not present

## 2019-07-26 DIAGNOSIS — Z87440 Personal history of urinary (tract) infections: Secondary | ICD-10-CM | POA: Diagnosis not present

## 2019-07-30 DIAGNOSIS — S83242A Other tear of medial meniscus, current injury, left knee, initial encounter: Secondary | ICD-10-CM | POA: Diagnosis not present

## 2019-07-30 DIAGNOSIS — M2241 Chondromalacia patellae, right knee: Secondary | ICD-10-CM | POA: Diagnosis not present

## 2019-08-03 ENCOUNTER — Encounter (HOSPITAL_COMMUNITY): Payer: Self-pay | Admitting: Urology

## 2019-08-03 ENCOUNTER — Encounter (HOSPITAL_COMMUNITY)
Admission: AD | Disposition: A | Discharge status: Home / Self Care | Payer: Self-pay | Source: Ambulatory Visit | Attending: Urology

## 2019-08-03 ENCOUNTER — Inpatient Hospital Stay (HOSPITAL_COMMUNITY): Payer: Medicare HMO

## 2019-08-03 ENCOUNTER — Other Ambulatory Visit: Payer: Self-pay | Admitting: Urology

## 2019-08-03 ENCOUNTER — Inpatient Hospital Stay (HOSPITAL_COMMUNITY): Payer: Medicare HMO | Admitting: Certified Registered"

## 2019-08-03 ENCOUNTER — Other Ambulatory Visit: Payer: Self-pay

## 2019-08-03 ENCOUNTER — Ambulatory Visit (HOSPITAL_COMMUNITY)
Admission: AD | Admit: 2019-08-03 | Discharge: 2019-08-03 | Disposition: A | Discharge status: Home / Self Care | Payer: Medicare HMO | Source: Ambulatory Visit | Attending: Urology | Admitting: Urology

## 2019-08-03 DIAGNOSIS — Z8546 Personal history of malignant neoplasm of prostate: Secondary | ICD-10-CM | POA: Insufficient documentation

## 2019-08-03 DIAGNOSIS — Z20822 Contact with and (suspected) exposure to covid-19: Secondary | ICD-10-CM | POA: Insufficient documentation

## 2019-08-03 DIAGNOSIS — I10 Essential (primary) hypertension: Secondary | ICD-10-CM | POA: Insufficient documentation

## 2019-08-03 DIAGNOSIS — R339 Retention of urine, unspecified: Secondary | ICD-10-CM | POA: Diagnosis not present

## 2019-08-03 DIAGNOSIS — N35812 Other urethral bulbous stricture, male: Secondary | ICD-10-CM | POA: Insufficient documentation

## 2019-08-03 DIAGNOSIS — Z79899 Other long term (current) drug therapy: Secondary | ICD-10-CM | POA: Insufficient documentation

## 2019-08-03 DIAGNOSIS — E871 Hypo-osmolality and hyponatremia: Secondary | ICD-10-CM | POA: Diagnosis not present

## 2019-08-03 DIAGNOSIS — R338 Other retention of urine: Secondary | ICD-10-CM | POA: Diagnosis not present

## 2019-08-03 DIAGNOSIS — Z87891 Personal history of nicotine dependence: Secondary | ICD-10-CM | POA: Diagnosis not present

## 2019-08-03 DIAGNOSIS — H409 Unspecified glaucoma: Secondary | ICD-10-CM | POA: Diagnosis not present

## 2019-08-03 DIAGNOSIS — J9601 Acute respiratory failure with hypoxia: Secondary | ICD-10-CM | POA: Diagnosis not present

## 2019-08-03 DIAGNOSIS — N35919 Unspecified urethral stricture, male, unspecified site: Secondary | ICD-10-CM | POA: Diagnosis not present

## 2019-08-03 DIAGNOSIS — N35012 Post-traumatic membranous urethral stricture: Secondary | ICD-10-CM | POA: Diagnosis not present

## 2019-08-03 HISTORY — DX: Myoneural disorder, unspecified: G70.9

## 2019-08-03 HISTORY — PX: BALLOON DILATION: SHX5330

## 2019-08-03 HISTORY — PX: CYSTOSCOPY WITH RETROGRADE URETHROGRAM: SHX6309

## 2019-08-03 HISTORY — DX: Synovial cyst of popliteal space (Baker), left knee: M71.22

## 2019-08-03 LAB — RESPIRATORY PANEL BY RT PCR (FLU A&B, COVID)
Influenza A by PCR: NEGATIVE
Influenza B by PCR: NEGATIVE
SARS Coronavirus 2 by RT PCR: NEGATIVE

## 2019-08-03 LAB — BASIC METABOLIC PANEL
Anion gap: 11 (ref 5–15)
BUN: 22 mg/dL (ref 8–23)
CO2: 26 mmol/L (ref 22–32)
Calcium: 9.5 mg/dL (ref 8.9–10.3)
Chloride: 101 mmol/L (ref 98–111)
Creatinine, Ser: 0.67 mg/dL (ref 0.61–1.24)
GFR calc Af Amer: >60 mL/min (ref >60)
GFR calc non Af Amer: >60 mL/min (ref >60)
Glucose, Bld: 109 mg/dL — ABNORMAL HIGH (ref 70–99)
Potassium: 4 mmol/L (ref 3.5–5.1)
Sodium: 138 mmol/L (ref 135–145)

## 2019-08-03 LAB — CBC
HCT: 41.9 % (ref 39.0–52.0)
Hemoglobin: 14.4 g/dL (ref 13.0–17.0)
MCH: 30.3 pg (ref 26.0–34.0)
MCHC: 34.4 g/dL (ref 30.0–36.0)
MCV: 88 fL (ref 80.0–100.0)
Platelets: 339 10*3/uL (ref 150–400)
RBC: 4.76 MIL/uL (ref 4.22–5.81)
RDW: 12.6 % (ref 11.5–15.5)
WBC: 10.8 10*3/uL — ABNORMAL HIGH (ref 4.0–10.5)
nRBC: 0 % (ref 0.0–0.2)

## 2019-08-03 SURGERY — BALLOON DILATION
Anesthesia: General

## 2019-08-03 MED ORDER — FENTANYL CITRATE (PF) 100 MCG/2ML IJ SOLN: 25.0000 ug | INTRAMUSCULAR | Status: DC | PRN

## 2019-08-03 MED ORDER — LIDOCAINE 2% (20 MG/ML) 5 ML SYRINGE
INTRAMUSCULAR | Status: DC | PRN
  Administered 2019-08-03: 100 mg via INTRAVENOUS

## 2019-08-03 MED ORDER — FENTANYL CITRATE (PF) 100 MCG/2ML IJ SOLN
INTRAMUSCULAR | Status: DC | PRN
  Administered 2019-08-03 (×2): 50 ug via INTRAVENOUS

## 2019-08-03 MED ORDER — ONDANSETRON HCL 4 MG/2ML IJ SOLN
INTRAMUSCULAR | Status: DC | PRN
  Administered 2019-08-03: 4 mg via INTRAVENOUS

## 2019-08-03 MED ORDER — DEXAMETHASONE SODIUM PHOSPHATE 10 MG/ML IJ SOLN
INTRAMUSCULAR | Status: AC
  Filled 2019-08-03: qty 1

## 2019-08-03 MED ORDER — PROPOFOL 10 MG/ML IV BOLUS
INTRAVENOUS | Status: AC
  Filled 2019-08-03: qty 20

## 2019-08-03 MED ORDER — SODIUM CHLORIDE 0.9 % IV SOLN
1.0000 g | INTRAVENOUS | Status: AC
  Administered 2019-08-03: 1 g via INTRAVENOUS
  Filled 2019-08-03: qty 1

## 2019-08-03 MED ORDER — LIDOCAINE 2% (20 MG/ML) 5 ML SYRINGE
INTRAMUSCULAR | Status: AC
  Filled 2019-08-03: qty 15

## 2019-08-03 MED ORDER — EPHEDRINE 5 MG/ML INJ
INTRAVENOUS | Status: AC
  Filled 2019-08-03: qty 20

## 2019-08-03 MED ORDER — STERILE WATER FOR INJECTION IJ SOLN
INTRAMUSCULAR | Status: AC
  Filled 2019-08-03: qty 10

## 2019-08-03 MED ORDER — CEPHALEXIN 500 MG PO CAPS: 500.0000 mg | ORAL_CAPSULE | Freq: Two times a day (BID) | ORAL | 0 refills | Status: DC

## 2019-08-03 MED ORDER — PROMETHAZINE HCL 25 MG/ML IJ SOLN: 6.2500 mg | INTRAMUSCULAR | Status: DC | PRN

## 2019-08-03 MED ORDER — ONDANSETRON HCL 4 MG/2ML IJ SOLN
INTRAMUSCULAR | Status: AC
  Filled 2019-08-03: qty 2

## 2019-08-03 MED ORDER — PROPOFOL 500 MG/50ML IV EMUL
INTRAVENOUS | Status: DC | PRN
  Administered 2019-08-03: 180 mg via INTRAVENOUS

## 2019-08-03 MED ORDER — ACETAMINOPHEN 500 MG PO TABS
1000.0000 mg | ORAL_TABLET | Freq: Once | ORAL | Status: AC
  Administered 2019-08-03: 1000 mg via ORAL
  Filled 2019-08-03: qty 2

## 2019-08-03 MED ORDER — DEXAMETHASONE SODIUM PHOSPHATE 10 MG/ML IJ SOLN
INTRAMUSCULAR | Status: DC | PRN
  Administered 2019-08-03: 10 mg via INTRAVENOUS

## 2019-08-03 MED ORDER — IOHEXOL 300 MG/ML  SOLN
INTRAMUSCULAR | Status: DC | PRN
  Administered 2019-08-03: 19:00:00 10 mL

## 2019-08-03 MED ORDER — LACTATED RINGERS IV SOLN: INTRAVENOUS | Status: DC

## 2019-08-03 MED ORDER — PHENYLEPHRINE 40 MCG/ML (10ML) SYRINGE FOR IV PUSH (FOR BLOOD PRESSURE SUPPORT)
PREFILLED_SYRINGE | INTRAVENOUS | Status: AC
  Filled 2019-08-03: qty 10

## 2019-08-03 MED ORDER — STERILE WATER FOR IRRIGATION IR SOLN
Status: DC | PRN
  Administered 2019-08-03: 3000 mL

## 2019-08-03 MED ORDER — FENTANYL CITRATE (PF) 100 MCG/2ML IJ SOLN
INTRAMUSCULAR | Status: AC
  Filled 2019-08-03: qty 2

## 2019-08-03 MED ORDER — EPHEDRINE SULFATE-NACL 50-0.9 MG/10ML-% IV SOSY
PREFILLED_SYRINGE | INTRAVENOUS | Status: DC | PRN
  Administered 2019-08-03: 10 mg via INTRAVENOUS

## 2019-08-03 MED ORDER — CELECOXIB 200 MG PO CAPS
200.0000 mg | ORAL_CAPSULE | Freq: Once | ORAL | Status: AC
  Administered 2019-08-03: 200 mg via ORAL
  Filled 2019-08-03: qty 1

## 2019-08-03 MED ORDER — TRAMADOL HCL 50 MG PO TABS: 50.0000 mg | ORAL_TABLET | Freq: Four times a day (QID) | ORAL | 0 refills | Status: AC | PRN

## 2019-08-03 MED ORDER — ESMOLOL HCL 100 MG/10ML IV SOLN
INTRAVENOUS | Status: AC
  Filled 2019-08-03: qty 10

## 2019-08-03 SURGICAL SUPPLY — 19 items
BAG URINE DRAIN 2000ML AR STRL (UROLOGICAL SUPPLIES) ×2 IMPLANT
BALLN NEPHROSTOMY (BALLOONS) ×2
BALLOON NEPHROSTOMY (BALLOONS) ×1 IMPLANT
CATH FOLEY 2W COUNCIL 20FR 5CC (CATHETERS) ×2 IMPLANT
CATH INTERMIT  6FR 70CM (CATHETERS) IMPLANT
CATH ROBINSON RED A/P 14FR (CATHETERS) IMPLANT
CATH URET 5FR 28IN CONE TIP (BALLOONS)
CATH URET 5FR 70CM CONE TIP (BALLOONS) IMPLANT
CLOTH BEACON ORANGE TIMEOUT ST (SAFETY) ×2 IMPLANT
GLOVE BIO SURGEON STRL SZ7.5 (GLOVE) ×2 IMPLANT
GOWN STRL REUS W/TWL LRG LVL3 (GOWN DISPOSABLE) ×2 IMPLANT
GUIDEWIRE ANG ZIPWIRE 038X150 (WIRE) IMPLANT
GUIDEWIRE STR DUAL SENSOR (WIRE) ×2 IMPLANT
KIT TURNOVER KIT A (KITS) IMPLANT
MANIFOLD NEPTUNE II (INSTRUMENTS) IMPLANT
NS IRRIG 1000ML POUR BTL (IV SOLUTION) IMPLANT
PACK CYSTO (CUSTOM PROCEDURE TRAY) ×2 IMPLANT
PENCIL SMOKE EVACUATOR (MISCELLANEOUS) IMPLANT
WATER STERILE IRR 3000ML UROMA (IV SOLUTION) ×2 IMPLANT

## 2019-08-03 NOTE — Anesthesia Procedure Notes (Signed)
Procedure Name: LMA Insertion Date/Time: 08/03/2019 6:56 PM Performed by: Gerald Leitz, CRNA Pre-anesthesia Checklist: Patient identified, Patient being monitored, Timeout performed, Emergency Drugs available and Suction available Patient Re-evaluated:Patient Re-evaluated prior to induction Oxygen Delivery Method: Circle system utilized Preoxygenation: Pre-oxygenation with 100% oxygen Induction Type: IV induction Ventilation: Mask ventilation without difficulty LMA: LMA inserted and LMA with gastric port inserted LMA Size: 4.0 Tube type: Oral Number of attempts: 1 Placement Confirmation: positive ETCO2 and breath sounds checked- equal and bilateral Tube secured with: Tape Dental Injury: Teeth and Oropharynx as per pre-operative assessment

## 2019-08-03 NOTE — Brief Op Note (Signed)
08/03/2019  7:21 PM  PATIENT:  Jeffrey Porter  86 y.o. male  PRE-OPERATIVE DIAGNOSIS:  urethral stricture  POST-OPERATIVE DIAGNOSIS:  urethral stricture  PROCEDURE:  Procedure(s): BALLOON DILATION (N/A) CYSTOSCOPY WITH RETROGRADE URETHROGRAM (N/A)  SURGEON:  Surgeon(s) and Role:    Alexis Frock, MD - Primary  PHYSICIAN ASSISTANT:   ASSISTANTS: none   ANESTHESIA:   general  EBL:  minimal   BLOOD ADMINISTERED:none  DRAINS: 80F foley to gravity   LOCAL MEDICATIONS USED:  NONE  SPECIMEN:  No Specimen  DISPOSITION OF SPECIMEN:  N/A  COUNTS:  YES  TOURNIQUET:  * No tourniquets in log *  DICTATION: .Other Dictation: Dictation Number [id number]  PLAN OF CARE: Discharge to home after PACU  PATIENT DISPOSITION:  PACU - hemodynamically stable.   Delay start of Pharmacological VTE agent (>24hrs) due to surgical blood loss or risk of bleeding: yes

## 2019-08-03 NOTE — Transfer of Care (Signed)
Immediate Anesthesia Transfer of Care Note  Patient: Jeffrey Porter  Procedure(s) Performed: Procedure(s): BALLOON DILATION (N/A) CYSTOSCOPY WITH RETROGRADE URETHROGRAM (N/A)  Patient Location: PACU  Anesthesia Type:General  Level of Consciousness: Alert, Awake, Oriented  Airway & Oxygen Therapy: Patient Spontanous Breathing  Post-op Assessment: Report given to RN  Post vital signs: Reviewed and stable  Last Vitals:  Vitals:   08/03/19 1653 08/03/19 1930  BP: (!) 172/72   Pulse: 100   Resp: 18   Temp: 37.3 C 36.5 C  SpO2: [id number]     Complications: No apparent anesthesia complications

## 2019-08-03 NOTE — Discharge Instructions (Signed)
1 - You may have urinary urgency (bladder spasms) and bloody urine on / off with catheter in place. This is normal.  2 - Call MD or go to ER for fever >102, severe pain / nausea / vomiting not relieved by medications, or acute change in medical status   Indwelling Urinary Catheter Care, Adult An indwelling urinary catheter is a thin tube that is put into your bladder. The tube helps to drain pee (urine) out of your body. The tube goes in through your urethra. Your urethra is where pee comes out of your body. Your pee will come out through the catheter, then it will go into a bag (drainage bag). Take good care of your catheter so it will work well. How to wear your catheter and bag Supplies needed  Sticky tape (adhesive tape) or a leg strap.  Alcohol wipe or soap and water (if you use tape).  A clean towel (if you use tape).  Large overnight bag.  Smaller bag (leg bag). Wearing your catheter Attach your catheter to your leg with tape or a leg strap.  Make sure the catheter is not pulled tight.  If a leg strap gets wet, take it off and put on a dry strap.  If you use tape to hold the bag on your leg: 1. Use an alcohol wipe or soap and water to wash your skin where the tape made it sticky before. 2. Use a clean towel to pat-dry that skin. 3. Use new tape to make the bag stay on your leg. Wearing your bags You should have been given a large overnight bag.  You may wear the overnight bag in the day or night.  Always have the overnight bag lower than your bladder.  Do not let the bag touch the floor.  Before you go to sleep, put a clean plastic bag in a wastebasket. Then hang the overnight bag inside the wastebasket. You should also have a smaller leg bag that fits under your clothes.  Always wear the leg bag below your knee.  Do not wear your leg bag at night. How to care for your skin and catheter Supplies needed  A clean washcloth.  Water and mild soap.  A clean  towel. Caring for your skin and catheter      Clean the skin around your catheter every day: 1. Wash your hands with soap and water. 2. Wet a clean washcloth in warm water and mild soap. 3. Clean the skin around your urethra.  If you are male:  Gently spread the folds of skin around your vagina (labia).  With the washcloth in your other hand, wipe the inner side of your labia on each side. Wipe from front to back.  If you are male:  Pull back any skin that covers the end of your penis (foreskin).  With the washcloth in your other hand, wipe your penis in small circles. Start wiping at the tip of your penis, then move away from the catheter.  Move the foreskin back in place, if needed. 4. With your free hand, hold the catheter close to where it goes into your body.  Keep holding the catheter during cleaning so it does not get pulled out. 5. With the washcloth in your other hand, clean the catheter.  Only wipe downward on the catheter.  Do not wipe upward toward your body. Doing this may push germs into your urethra and cause infection. 6. Use a clean towel to pat-dry the  catheter and the skin around it. Make sure to wipe off all soap. 7. Wash your hands with soap and water.  Shower every day. Do not take baths.  Do not use cream, ointment, or lotion on the area where the catheter goes into your body, unless your doctor tells you to.  Do not use powders, sprays, or lotions on your genital area.  Check your skin around the catheter every day for signs of infection. Check for: ? Redness, swelling, or pain. ? Fluid or blood. ? Warmth. ? Pus or a bad smell. How to empty the bag Supplies needed  Rubbing alcohol.  Gauze pad or cotton ball.  Tape or a leg strap. Emptying the bag Pour the pee out of your bag when it is ?- full, or at least 2-3 times a day. Do this for your overnight bag and your leg bag. 1. Wash your hands with soap and water. 2. Separate (detach)  the bag from your leg. 3. Hold the bag over the toilet or a clean pail. Keep the bag lower than your hips and bladder. This is so the pee (urine) does not go back into the tube. 4. Open the pour spout. It is at the bottom of the bag. 5. Empty the pee into the toilet or pail. Do not let the pour spout touch any surface. 6. Put rubbing alcohol on a gauze pad or cotton ball. 7. Use the gauze pad or cotton ball to clean the pour spout. 8. Close the pour spout. 9. Attach the bag to your leg with tape or a leg strap. 10. Wash your hands with soap and water. Follow instructions for cleaning the drainage bag:  From the product maker.  As told by your doctor. How to change the bag Supplies needed  Alcohol wipes.  A clean bag.  Tape or a leg strap. Changing the bag Replace your bag when it starts to leak, smell bad, or look dirty. 1. Wash your hands with soap and water. 2. Separate the dirty bag from your leg. 3. Pinch the catheter with your fingers so that pee does not spill out. 4. Separate the catheter tube from the bag tube where these tubes connect (at the connection valve). Do not let the tubes touch any surface. 5. Clean the end of the catheter tube with an alcohol wipe. Use a different alcohol wipe to clean the end of the bag tube. 6. Connect the catheter tube to the tube of the clean bag. 7. Attach the clean bag to your leg with tape or a leg strap. Do not make the bag tight on your leg. 8. Wash your hands with soap and water. General rules   Never pull on your catheter. Never try to take it out. Doing that can hurt you.  Always wash your hands before and after you touch your catheter or bag. Use a mild, fragrance-free soap. If you do not have soap and water, use hand sanitizer.  Always make sure there are no twists or bends (kinks) in the catheter tube.  Always make sure there are no leaks in the catheter or bag.  Drink enough fluid to keep your pee pale yellow.  Do not  take baths, swim, or use a hot tub.  If you are male, wipe from front to back after you poop (have a bowel movement). Contact a doctor if:  Your pee is cloudy.  Your pee smells worse than usual.  Your catheter gets clogged.  Your catheter  leaks.  Your bladder feels full. Get help right away if:  You have redness, swelling, or pain where the catheter goes into your body.  You have fluid, blood, pus, or a bad smell coming from the area where the catheter goes into your body.  Your skin feels warm where the catheter goes into your body.  You have a fever.  You have pain in your: ? Belly (abdomen). ? Legs. ? Lower back. ? Bladder.  You see blood in the catheter.  Your pee is pink or red.  You feel sick to your stomach (nauseous).  You throw up (vomit).  You have chills.  Your pee is not draining into the bag.  Your catheter gets pulled out. Summary  An indwelling urinary catheter is a thin tube that is placed into the bladder to help drain pee (urine) out of the body.  The catheter is placed into the part of the body that drains pee from the bladder (urethra).  Taking good care of your catheter will keep it working properly and help prevent problems.  Always wash your hands before and after touching your catheter or bag.  Never pull on your catheter or try to take it out. This information is not intended to replace advice given to you by your health care provider. Make sure you discuss any questions you have with your health care provider. Document Revised: 08/04/2018 Document Reviewed: 11/26/2016 Elsevier Patient Education  2020 [place] Anesthesia, Adult, Care After This sheet gives you information about how to care for yourself after your procedure. Your health care provider may also give you more specific instructions. If you have problems or questions, contact your health care provider. What can I expect after the procedure? After the  procedure, the following side effects are common:  Pain or discomfort at the IV site.  Nausea.  Vomiting.  Sore throat.  Trouble concentrating.  Feeling cold or chills.  Weak or tired.  Sleepiness and fatigue.  Soreness and body aches. These side effects can affect parts of the body that were not involved in surgery. Follow these instructions at home:  For at least 24 hours after the procedure:  Have a responsible adult stay with you. It is important to have someone help care for you until you are awake and alert.  Rest as needed.  Do not: ? Participate in activities in which you could fall or become injured. ? Drive. ? Use heavy machinery. ? Drink alcohol. ? Take sleeping pills or medicines that cause drowsiness. ? Make important decisions or sign legal documents. ? Take care of children on your own. Eating and drinking  Follow any instructions from your health care provider about eating or drinking restrictions.  When you feel hungry, start by eating small amounts of foods that are soft and easy to digest (bland), such as toast. Gradually return to your regular diet.  Drink enough fluid to keep your urine pale yellow.  If you vomit, rehydrate by drinking water, juice, or clear broth. General instructions  If you have sleep apnea, surgery and certain medicines can increase your risk for breathing problems. Follow instructions from your health care provider about wearing your sleep device: ? Anytime you are sleeping, including during daytime naps. ? While taking prescription pain medicines, sleeping medicines, or medicines that make you drowsy.  Return to your normal activities as told by your health care provider. Ask your health care provider what activities are safe for you.  Take over-the-counter and prescription medicines only as told by your health care provider.  If you smoke, do not smoke without supervision.  Keep all follow-up visits as told by your  health care provider. This is important. Contact a health care provider if:  You have nausea or vomiting that does not get better with medicine.  You cannot eat or drink without vomiting.  You have pain that does not get better with medicine.  You are unable to pass urine.  You develop a skin rash.  You have a fever.  You have redness around your IV site that gets worse. Get help right away if:  You have difficulty breathing.  You have chest pain.  You have blood in your urine or stool, or you vomit blood. Summary  After the procedure, it is common to have a sore throat or nausea. It is also common to feel tired.  Have a responsible adult stay with you for the first 24 hours after general anesthesia. It is important to have someone help care for you until you are awake and alert.  When you feel hungry, start by eating small amounts of foods that are soft and easy to digest (bland), such as toast. Gradually return to your regular diet.  Drink enough fluid to keep your urine pale yellow.  Return to your normal activities as told by your health care provider. Ask your health care provider what activities are safe for you. This information is not intended to replace advice given to you by your health care provider. Make sure you discuss any questions you have with your health care provider. Document Revised: 04/15/2017 Document Reviewed: 11/26/2016 Elsevier Patient Education  [place].

## 2019-08-03 NOTE — Anesthesia Preprocedure Evaluation (Signed)
Anesthesia Evaluation  Patient identified by MRN, date of birth, ID band Patient awake    Reviewed: Allergy & Precautions, NPO status , Patient's Chart, lab work & pertinent test results  Airway Mallampati: II  TM Distance: >3 FB     Dental no notable dental hx. (+) Dental Advisory Given   Pulmonary neg pulmonary ROS,    Pulmonary exam normal        Cardiovascular hypertension, negative cardio ROS Normal cardiovascular exam     Neuro/Psych negative neurological ROS  negative psych ROS   GI/Hepatic negative GI ROS, Neg liver ROS,   Endo/Other  negative endocrine ROS  Renal/GU Prostate Ca  negative genitourinary   Musculoskeletal negative musculoskeletal ROS (+)   Abdominal   Peds negative pediatric ROS (+)  Hematology negative hematology ROS (+)   Anesthesia Other Findings   Reproductive/Obstetrics negative OB ROS                             Anesthesia Physical Anesthesia Plan  ASA: III  Anesthesia Plan: General   Post-op Pain Management:    Induction: Intravenous  PONV Risk Score and Plan: 3 and Ondansetron, Dexamethasone and Treatment may vary due to age or medical condition  Airway Management Planned: LMA  Additional Equipment:   Intra-op Plan:   Post-operative Plan: Extubation in OR  Informed Consent: I have reviewed the patients History and Physical, chart, labs and discussed the procedure including the risks, benefits and alternatives for the proposed anesthesia with the patient or authorized representative who has indicated his/her understanding and acceptance.     Dental advisory given  Plan Discussed with: CRNA, Anesthesiologist and Surgeon  Anesthesia Plan Comments:         Anesthesia Quick Evaluation

## 2019-08-03 NOTE — H&P (Signed)
Jeffrey Porter is an 86 y.o. male.    Chief Complaint: Recurrent Urethral Stricture / Urinary Retention  HPI:   1 - Recurrent Urethral Stricture / Urinary Retention - known high grade bulbar stricture s/p prior dilation x several after prostate brachytherapy. Presented to Urol office today with pelvic pain, dribbling urine and PVR >419mL. Office cysto today with high grade stricture recurrent and only pinhole lumen. Felt safest for OR dilation. NPO since 9:30 AM.    Past Medical History:  Diagnosis Date  . Baker's cyst of knee, left   . Glaucoma   . Hypertension   . Neuromuscular disorder ([place])    peripheral neuropathy  . Prostate cancer [facility])     Past Surgical History:  Procedure Laterality Date  . CATARACT EXTRACTION    . HERNIA REPAIR    . INSERTION BRACHYTHERAPY DEVICE      Family History  Problem Relation Age of Onset  . Cancer Mother   . Cancer Father    Social History:  reports that he has quit smoking. His smoking use included cigarettes. He quit after 6.00 years of use. He has never used smokeless tobacco. He reports previous alcohol use of about 1.0 standard drinks of alcohol per week. He reports that he does not use drugs.  Allergies:  Allergies  Allergen Reactions  . Timolol Maleate Other (See Comments)    Syncope     Medications Prior to Admission  Medication Sig Dispense Refill  . acetaminophen (TYLENOL) 500 MG tablet Take 500 mg by mouth daily as needed for moderate pain.    . Carbidopa-Levodopa ER (SINEMET CR) 25-100 MG tablet controlled release TAKE 1 TABLET BY MOUTH THREE TIMES A DAY (Patient not taking: Reported on 05/19/2018) 90 tablet 5  . Cholecalciferol (VITAMIN D) 2000 units tablet Take 2,000 Units by mouth daily.    Marland Kitchen latanoprost (XALATAN) 0.005 % ophthalmic solution Place 1 drop into both eyes at bedtime.     . LUTEIN-ZEAXANTHIN PO Take 1 tablet by mouth daily.    . Magnesium 250 MG TABS Take 250 mg by mouth daily.    . Multiple  Vitamins-Minerals (HAIR SKIN AND NAILS FORMULA PO) Take by mouth.    . niacin 100 MG tablet Take 100 mg by mouth at bedtime.      Results for orders placed or performed during the hospital encounter of 08/03/19 (from the past 48 hour(s))  Respiratory Panel by RT PCR (Flu A&B, Covid) - Nasopharyngeal Swab     Status: None   Collection Time: 08/03/19  4:46 PM   Specimen: Nasopharyngeal Swab  Result Value Ref Range   SARS Coronavirus 2 by RT PCR NEGATIVE NEGATIVE    Comment: (NOTE) SARS-CoV-2 target nucleic acids are NOT DETECTED. The SARS-CoV-2 RNA is generally detectable in upper respiratoy specimens during the acute phase of infection. The lowest concentration of SARS-CoV-2 viral copies this assay can detect is 131 copies/mL. A negative result does not preclude SARS-Cov-2 infection and should not be used as the sole basis for treatment or other patient management decisions. A negative result may occur with  improper specimen collection/handling, submission of specimen other than nasopharyngeal swab, presence of viral mutation(s) within the areas targeted by this assay, and inadequate number of viral copies (<131 copies/mL). A negative result must be combined with clinical observations, patient history, and epidemiological information. The expected result is Negative. Fact Sheet for Patients:  PinkCheek.be Fact Sheet for Healthcare Providers:  GravelBags.it This test is not yet ap proved or  cleared by the [place] and  has been authorized for detection and/or diagnosis of SARS-CoV-2 by FDA under an Emergency Use Authorization (EUA). This EUA will remain  in effect (meaning this test can be used) for the duration of the COVID-19 declaration under Section 564(b)(1) of the Act, 21 U.S.C. section 360bbb-3(b)(1), unless the authorization is terminated or revoked sooner.    Influenza A by PCR NEGATIVE NEGATIVE   Influenza  B by PCR NEGATIVE NEGATIVE    Comment: (NOTE) The Xpert Xpress SARS-CoV-2/FLU/RSV assay is intended as an aid in  the diagnosis of influenza from Nasopharyngeal swab specimens and  should not be used as a sole basis for treatment. Nasal washings and  aspirates are unacceptable for Xpert Xpress SARS-CoV-2/FLU/RSV  testing. Fact Sheet for Patients: PinkCheek.be Fact Sheet for Healthcare Providers: GravelBags.it This test is not yet approved or cleared by the [place] FDA and  has been authorized for detection and/or diagnosis of SARS-CoV-2 by  FDA under an Emergency Use Authorization (EUA). This EUA will remain  in effect (meaning this test can be used) for the duration of the  Covid-19 declaration under Section 564(b)(1) of the Act, 21  U.S.C. section 360bbb-3(b)(1), unless the authorization is  terminated or revoked. Performed at [facility], [place] [address], [place], [place] [address]    No results found.  Review of Systems  Constitutional: Negative for chills and fever.  Genitourinary: Positive for difficulty urinating and dysuria.  All other systems reviewed and are negative.   Blood pressure (!) 172/72, pulse 100, temperature 99.1 F (37.3 C), temperature source Oral, resp. rate 18, height 5\' 3"  (1.6 m), weight 68.2 kg, SpO2 98 %. Physical Exam  Constitutional: He appears well-developed.  Very pleasant. Family at bedside as well.   Cardiovascular: Normal rate.  Respiratory: Effort normal.  GI: Soft.  Genitourinary:    Genitourinary Comments: Dribbling small urine per urethra.    Musculoskeletal:        General: Normal range of motion.     Cervical back: Normal range of motion.  Neurological: He is alert.  Skin: Skin is warm.  Psychiatric: He has a normal mood and affect.     Assessment/Plan  Proceed as planned with cysto / urethral dilation / catheter placement. Likely DC home  POST-OP with voiding trial in abtou 2 weeks in office. Risks, benefits, alternatives, expected peri-op course discussed.   Alexis Frock, MD 08/03/2019, 5:34 PM

## 2019-08-04 NOTE — Anesthesia Postprocedure Evaluation (Signed)
Anesthesia Post Note  Patient: Jeffrey Porter  Procedure(s) Performed: BALLOON DILATION (N/A ) CYSTOSCOPY WITH RETROGRADE URETHROGRAM (N/A )     Patient location during evaluation: PACU Anesthesia Type: General Level of consciousness: sedated Pain management: pain level controlled Vital Signs Assessment: post-procedure vital signs reviewed and stable Respiratory status: spontaneous breathing and respiratory function stable Cardiovascular status: stable Postop Assessment: no apparent nausea or vomiting Anesthetic complications: no    Last Vitals:  Vitals:   08/03/19 2135 08/03/19 2215  BP: (!) 149/75 (!) 151/72  Pulse: 95 98  Resp: 16 16  Temp:  36.8 C  SpO2: 100% 100%    Last Pain:  Vitals:   08/03/19 2215  TempSrc:   PainSc: 0-No pain                 Bellamy Judson DANIEL

## 2019-08-04 NOTE — Op Note (Signed)
NAMEBARTLOMIEJ, Jeffrey Porter MEDICAL RECORD [id number] ACCOUNT [id number] DATE OF BIRTH:08/18/1931 FACILITY: [facility] LOCATION: [facility] PHYSICIAN:Frances Joynt Tresa Moore, MD  OPERATIVE REPORT  DATE OF PROCEDURE:  08/03/2019  SURGEON:  Alexis Frock, MD  PREOPERATIVE DIAGNOSIS:  Recurrent high-grade urethral stricture.  PROCEDURE: 1.  Cystoscopy, retrograde urethrogram. 2.  Urethral dilation with catheter placement, complicated.  ESTIMATED BLOOD LOSS:  Nil.  COMPLICATIONS:  None.  SPECIMENS:  None.  DRAINS:  A 20-French council Foley catheter to gravity drainage, 10 mL of water in the balloon.  FINDINGS: 1.  High-grade bulbar urethral stricture, recurrent, estimated 7-French predilation, 24-French post-dilation. 2.  Unremarkable urinary bladder.  INDICATIONS:  The patient is a pleasant 86 year old man with history of brachytherapy for prostate cancer and recurrent urethral stricture disease.  He has had dilation of this several times.  He has had a prodrome of slowly progressive obstructive and  irritative voiding symptoms.  He was found on recent office cystoscopy to have a partial recurrence of his stricture.  He did not elect to have preemptive surgery at that time.  He represented to the office today in frank urinary retention, dribbling  only tiny amount of urine with a full bladder.  Office cystoscopy recorroborated high-grade stricture that did not appear amenable to office dilation.  Options were discussed, including recommended path of operative dilation.  He wished to proceed.   Informed consent was obtained and placed in the medical record.  DESCRIPTION OF PROCEDURE:  The patient being identified the procedure being cystoscopy, urethral dilation was confirmed.  Procedure timeout was performed.  Intravenous antibiotics administered.  General LMA anesthesia induced.  The patient was placed  into a low lithotomy position, sterile field was created, prepping and draping base of the penis,  perineum and proximal thighs using iodine.  Cystourethroscopy was performed with a 21-French rigid cystoscope with offset lens.  Inspection of the anterior  urethra revealed a high-grade pinpoint stricture at the area of the membranous urethra.  This was estimated to be approximately 7-French.  An open-ended catheter was used for stabilization and a 0.03 ZIPwire was advanced to the level of the presumed  urinary bladder.  The open-ended catheter was then advanced to this level and retrograde urethrogram was performed.  Retrograde urethrogram revealed high-grade stricture in the area of the proximal bulbar urethra.  There were brachytherapy seeds in the area of the prostate.  There was opacification of the bladder which confirmed position.  A sensor wire was advanced to  the lower urinary bladder, over which a new 24-French balloon dilation apparatus was carefully advanced.  Using cystoscopic and fluoroscopic vision, it was inflated to a pressure of 20 atmospheres, held for 90 seconds and then released and a new  20-French [place] tip catheter was placed over this.  Ten mL sterile water in the balloon connected to a single straight drain.  Procedure was terminated.  The patient tolerated the procedure well.  No immediate perioperative complications.  The patient  taken to the postanesthesia care unit in stable condition.  Plan for discharge home.  VN/NUANCE  D:08/03/2019 T:08/04/2019 JOB:[id number]

## 2019-08-08 NOTE — Progress Notes (Signed)
Pt called inquiring about MRI for knee and vaccination.  Advised to call primary MD

## 2019-08-17 DIAGNOSIS — R338 Other retention of urine: Secondary | ICD-10-CM | POA: Diagnosis not present

## 2019-08-17 DIAGNOSIS — Z8546 Personal history of malignant neoplasm of prostate: Secondary | ICD-10-CM | POA: Diagnosis not present

## 2019-08-24 DIAGNOSIS — M25562 Pain in left knee: Secondary | ICD-10-CM | POA: Diagnosis not present

## 2019-09-10 DIAGNOSIS — M48061 Spinal stenosis, lumbar region without neurogenic claudication: Secondary | ICD-10-CM | POA: Diagnosis not present

## 2019-10-31 ENCOUNTER — Other Ambulatory Visit: Payer: Self-pay

## 2019-10-31 ENCOUNTER — Encounter: Payer: Self-pay | Admitting: Physical Therapy

## 2019-10-31 ENCOUNTER — Ambulatory Visit: Payer: Medicare HMO | Attending: Orthopedic Surgery | Admitting: Physical Therapy

## 2019-10-31 DIAGNOSIS — M6281 Muscle weakness (generalized): Secondary | ICD-10-CM | POA: Diagnosis not present

## 2019-10-31 DIAGNOSIS — G8929 Other chronic pain: Secondary | ICD-10-CM | POA: Diagnosis not present

## 2019-10-31 DIAGNOSIS — R262 Difficulty in walking, not elsewhere classified: Secondary | ICD-10-CM

## 2019-10-31 DIAGNOSIS — R2689 Other abnormalities of gait and mobility: Secondary | ICD-10-CM

## 2019-10-31 DIAGNOSIS — M545 Low back pain, unspecified: Secondary | ICD-10-CM

## 2019-10-31 NOTE — Patient Instructions (Signed)
Access Code: [email or web address] URL: [email or web address] Date: 10/31/2019 Prepared by: Amador Cunas  Exercises Sit to Stand with Hands on Knees - 1 x daily - 7 x weekly - 3 sets - 10 reps Seated March - 1 x daily - 7 x weekly - 3 sets - 10 reps Seated Long Arc Quad - 1 x daily - 7 x weekly - 3 sets - 10 reps Seated Hamstring Stretch - 1 x daily - 7 x weekly - 3 sets - 2 reps - 30 sec hold Seated Gastroc Stretch with Strap - 1 x daily - 7 x weekly - 3 sets - 2 reps - 30 sec hold

## 2019-10-31 NOTE — Therapy (Signed)
[place] [address] [place], [place], [address] Phone: [phone]   Fax:  [phone]  Physical Therapy Evaluation  Patient Details  Name: Jeffrey Porter MRN: [id number] Date of Birth: Jun 28, 1931 Referring Provider (PT): [name]   Encounter Date: 10/31/2019   PT End of Session - 10/31/19 1723    Visit Number 1    Date for PT Re-Evaluation 12/31/17    Authorization Type [employer]na Medicare    PT Start Time 1618    PT Stop Time 1700    PT Time Calculation (min) 42 min    Equipment Utilized During Treatment Gait belt    Activity Tolerance Patient tolerated treatment well    Behavior During Therapy [facility] for tasks assessed/performed           Past Medical History:  Diagnosis Date  . Baker's cyst of knee, left   . Glaucoma   . Hypertension   . Neuromuscular disorder ([place])    peripheral neuropathy  . Prostate cancer [facility])     Past Surgical History:  Procedure Laterality Date  . BALLOON DILATION N/A 08/03/2019   Procedure: BALLOON DILATION;  Surgeon: Alexis Frock, MD;  Location: [facility];  Service: Urology;  Laterality: N/A;  . CATARACT EXTRACTION    . CYSTOSCOPY WITH RETROGRADE URETHROGRAM N/A 08/03/2019   Procedure: CYSTOSCOPY WITH RETROGRADE URETHROGRAM;  Surgeon: Alexis Frock, MD;  Location: [facility];  Service: Urology;  Laterality: N/A;  . HERNIA REPAIR    . INSERTION BRACHYTHERAPY DEVICE      There were no vitals filed for this visit.    Subjective Assessment - 10/31/19 1617    Subjective Pt reports that he was diagnosed with spinal stenosis and has been experiencing intermittent LBP. Although pt is having back pain he states main concern is getting back to walking. Pt states that he has a hard time walking; states he needs assistance to walk and leans on RW very hard.    Pertinent History peripheral neuropathy, HTN    Limitations Standing;Walking;House hold activities    Patient Stated Goals Improve  strength, walking. improved endurance.     Currently in Pain? No/denies              [facility] PT Assessment - 10/31/19 0001      Assessment   Medical Diagnosis Gait Abnormality    Referring Provider (PT) [name]    Prior Therapy Yes, for back pain      Precautions   Precautions Fall      Restrictions   Weight Bearing Restrictions No      Balance Screen   Has the patient fallen in the past 6 months No    Has the patient had a decrease in activity level because of a fear of falling?  Yes    Is the patient reluctant to leave their home because of a fear of falling?  No      Home Environment   Additional Comments no stairs       Prior Function   Level of Independence Needs assistance with ADLs;Needs assistance with gait    Vocation Retired      Engineer, production Intact      Posture/Postural Control   Posture Comments very stiff in lumbar spine      ROM / Strength   AROM / PROM / Strength AROM;Strength      AROM   Overall AROM Comments UE [facility]  Strength   Overall Strength Comments BLE 4-/5      Flexibility   Soft Tissue Assessment /Muscle Length yes    Hamstrings tight    Quadriceps tight    ITB tight      Transfers   Five time sit to stand comments  unable to complete    Comments excessive use of UE for STS;  posterior lean with STS       Ambulation/Gait   Ambulation/Gait Yes    Ambulation/Gait Assistance 4: Min guard    Ambulation Distance (Feet) 30 Feet    Assistive device Rolling walker    Gait Pattern Decreased step length - left;Decreased step length - right;Decreased stride length;Decreased dorsiflexion - right;Shuffle    Ambulation Surface Level    Gait velocity decreased    Gait Comments Inconsistent step length and step height,  Pt with significant shuffling with direction changes and turning around.                       Objective measurements completed on examination: See above findings.       [place] Adult  PT Treatment/Exercise - 10/31/19 0001      Knee/Hip Exercises: Stretches   Active Hamstring Stretch Both;1 rep;30 seconds    Gastroc Stretch Both;1 rep;30 seconds    Gastroc Stretch Limitations seated with strap      Knee/Hip Exercises: Seated   Long Arc Quad 10 reps;Both    Marching Both;10 reps    Sit to [employer] with UE support;5 reps                  PT Education - 10/31/19 1721    Education Details Pt educated on POC and HEP    Person(s) Educated Patient    Methods Explanation;Demonstration;Handout    Comprehension Verbalized understanding;Returned demonstration            PT Short Term Goals - 10/31/19 1757      PT SHORT TERM GOAL #1   Title Pt to be independent with initial HEP     Time 2    Period Weeks    Status New    Target Date 11/14/19             PT Long Term Goals - 10/31/19 1757      PT LONG TERM GOAL #1   Title Pt to demonstrate STS with UE support independently x5 to improve ability to transfer safely and perform household ADLs.    Time 6    Period Weeks    Status New    Target Date 12/12/19      PT LONG TERM GOAL #2   Title Pt to demo improved strength of Bil LEs, to at least 4+/5 to improve stability and gait.     Time 6    Period Weeks    Status New    Target Date 12/12/19      PT LONG TERM GOAL #3   Title Pt to demo safe ambulation with LRAD, with consistent ability for proper stride length and step height, for at least 272ft, to improve safety and fall risk.     Time 6    Period Weeks    Status New    Target Date 12/12/19      PT LONG TERM GOAL #4   Title Pt to be independent with final HEP    Time 6    Period Weeks    Status New  Target Date 12/12/19                  Plan - 10/31/19 1723    Clinical Impression Statement Pt reports to clinic with reports of decreasing mobility since COVID, inability to ambulate without assistance, and feeling weak. He received a diagnosis of spinal stenosis from MD; neg for  N/T, radiating pain, and is experiencing LBP intermittently. Pt is stiff through lumbar spine and has deficits in LE flexibility. Pt is overall deconditioned with decreased strength and endurance. Pt ambulated 25 ft with RW and CGA in clinic today; demos inconsistent step length, difficutly with initiating movement, shuffling, and difficulty advancing feet. Pt would benefit from skilled PT to address the above impairments.    Personal Factors and Comorbidities Age;Comorbidity 2;Past/Current Experience    Comorbidities peripheral neuropathy, HTN    Examination-Activity Limitations Bed Mobility;Bend;Lift;Stand;Stairs;Locomotion Level;Transfers    Examination-Participation Restrictions Community Activity;Interpersonal Relationship    Stability/Clinical Decision Making Stable/Uncomplicated    Clinical Decision Making Moderate    Rehab Potential Good    PT Frequency 2x / week    PT Duration 6 weeks    PT Treatment/Interventions ADLs/Self Care Home Management;Cryotherapy;Electrical Stimulation;Iontophoresis 4mg /ml Dexamethasone;Moist Heat;Therapeutic activities;Functional mobility training;Stair training;Gait training;DME Instruction;Ultrasound;Therapeutic exercise;Balance training;Neuromuscular re-education;Patient/family education;Dry needling;Passive range of motion;Manual techniques;Wheelchair mobility training;Taping;Vasopneumatic Device    PT Next Visit Plan LE strength, Standing static and dynamic balance, Gait training    PT Home Exercise Plan seated marches, LAQ, STS, gastroc stretch, hamstring stretch    Consulted and Agree with Plan of Care Patient           Patient will benefit from skilled therapeutic intervention in order to improve the following deficits and impairments:  Abnormal gait, Decreased endurance, Decreased activity tolerance, Decreased strength, Difficulty walking, Decreased mobility, Decreased balance, Improper body mechanics, Impaired flexibility, Decreased coordination,  Decreased safety awareness, Pain  Visit Diagnosis: Other abnormalities of gait and mobility  Muscle weakness (generalized)  Difficulty in walking, not elsewhere classified  Chronic bilateral low back pain without sciatica     Problem List Patient Active Problem List   Diagnosis Date Noted  . Essential hypertension 12/22/2017  . History of prostate cancer 12/22/2017  . Glaucoma 12/22/2017  . Aortic stenosis 12/22/2017  . OA (osteoarthritis) 12/22/2017  . Abnormal LFTs 12/22/2017  . Thrombocytosis ([place]) 12/22/2017  . Normocytic anemia 12/22/2017  . Hyponatremia 12/22/2017  . Cellulitis and abscess of left leg 12/21/2017  . Left ankle swelling   . Acute respiratory failure with hypoxia ([place])   . Paroxysmal supraventricular tachycardia ([place])   . Sepsis ([place]) 12/06/2017  . Acute lower UTI 12/06/2017  . Tachycardia 12/06/2017  . Leukocytosis 12/06/2017   Amador Cunas, PT, DPT [facility] 10/31/2019, 5:59 PM  [place] [address] [place], [place], [address] Phone: [phone]   Fax:  [phone]  Name: Jeffrey Porter MRN: [id number] Date of Birth: Dec 23, 1931

## 2019-11-07 ENCOUNTER — Ambulatory Visit: Payer: Medicare HMO | Admitting: Physical Therapy

## 2019-11-08 ENCOUNTER — Encounter: Payer: Medicare HMO | Admitting: Physical Therapy

## 2019-11-12 ENCOUNTER — Ambulatory Visit: Payer: Medicare HMO | Admitting: Physical Therapy

## 2019-11-12 ENCOUNTER — Other Ambulatory Visit: Payer: Self-pay

## 2019-11-12 DIAGNOSIS — G8929 Other chronic pain: Secondary | ICD-10-CM | POA: Diagnosis not present

## 2019-11-12 DIAGNOSIS — M6281 Muscle weakness (generalized): Secondary | ICD-10-CM | POA: Diagnosis not present

## 2019-11-12 DIAGNOSIS — R262 Difficulty in walking, not elsewhere classified: Secondary | ICD-10-CM

## 2019-11-12 DIAGNOSIS — R2689 Other abnormalities of gait and mobility: Secondary | ICD-10-CM | POA: Diagnosis not present

## 2019-11-12 DIAGNOSIS — M545 Low back pain, unspecified: Secondary | ICD-10-CM

## 2019-11-12 NOTE — Therapy (Signed)
[place] [address] [place], [place], [address] Phone: [phone]   Fax:  [phone]  Physical Therapy Treatment  Patient Details  Name: Jeffrey Porter MRN: [id number] Date of Birth: 04-Jan-1932 Referring Provider (PT): [name]   Encounter Date: 11/12/2019   PT End of Session - 11/12/19 1231    Visit Number 2    Date for PT Re-Evaluation 12/31/17    Authorization Type Aetna Medicare    PT Start Time 1152    PT Stop Time 1230    PT Time Calculation (min) 38 min           Past Medical History:  Diagnosis Date  . Baker's cyst of knee, left   . Glaucoma   . Hypertension   . Neuromuscular disorder ([place])    peripheral neuropathy  . Prostate cancer [facility])     Past Surgical History:  Procedure Laterality Date  . BALLOON DILATION N/A 08/03/2019   Procedure: BALLOON DILATION;  Surgeon: Alexis Frock, MD;  [facility];  Service: Urology;  Laterality: N/A;  . CATARACT EXTRACTION    . CYSTOSCOPY WITH RETROGRADE URETHROGRAM N/A 08/03/2019   Procedure: CYSTOSCOPY WITH RETROGRADE URETHROGRAM;  Surgeon: Alexis Frock, MD;  [facility];  Service: Urology;  Laterality: N/A;  . HERNIA REPAIR    . INSERTION BRACHYTHERAPY DEVICE      There were no vitals filed for this visit.   Subjective Assessment - 11/12/19 1157    Subjective Patient reports feeling irritated because late, however, took tylonel before came in and in no pain.    Pertinent History peripheral neuropathy, HTN    Limitations Standing;Walking;House hold activities    Currently in Pain? No/denies                             [facility] Adult PT Treatment/Exercise - 11/12/19 0001      Ambulation/Gait   Ambulation/Gait Assistance 4: Min guard    Ambulation Distance (Feet) 60 Feet    Assistive device Rolling walker    Gait Pattern Decreased step length - left;Decreased step length - right;Decreased stride length;Decreased  dorsiflexion - right;Shuffle    Ambulation Surface Level    Gait velocity decreased    Gait Comments Inconsistent step length and step height,  Pt with significant shuffling with direction changes and turning around.       Posture/Postural Control   Posture Comments very stiff in lumbar spine      Balance   Balance Assessed Yes      Static Standing Balance   Tandem Stance - Right Leg --   semi :30 good   Tandem Stance - Left Leg --   semi; :30 limited on L     Exercises   Exercises Lumbar;Knee/Hip      Lumbar Exercises: Aerobic   Nustep L4 5 minutes      Knee/Hip Exercises: Seated   Long Arc Quad Both;15 reps    Marching Both;15 reps                    PT Short Term Goals - 10/31/19 1757      PT SHORT TERM GOAL #1   Title Pt to be independent with initial HEP     Time 2    Period Weeks    Status New    Target Date 11/14/19  PT Long Term Goals - 10/31/19 1757      PT LONG TERM GOAL #1   Title Pt to demonstrate STS with UE support independently x5 to improve ability to transfer safely and perform household ADLs.    Time 6    Period Weeks    Status New    Target Date 12/12/19      PT LONG TERM GOAL #2   Title Pt to demo improved strength of Bil LEs, to at least 4+/5 to improve stability and gait.     Time 6    Period Weeks    Status New    Target Date 12/12/19      PT LONG TERM GOAL #3   Title Pt to demo safe ambulation with LRAD, with consistent ability for proper stride length and step height, for at least 24ft, to improve safety and fall risk.     Time 6    Period Weeks    Status New    Target Date 12/12/19      PT LONG TERM GOAL #4   Title Pt to be independent with final HEP    Time 6    Period Weeks    Status New    Target Date 12/12/19                 Plan - 11/12/19 1232    Clinical Impression Statement Patient demonstrating difficulty with fluid gait. Ambulation with RW and inconsitent step length and speed  especially when turning. Patient with shuffling gait and difficulty advancing feet especially with fatigue. Patient presents with greater balance deficits on L LE than on R. Lumbar spine extremely tight.    Personal Factors and Comorbidities Age;Comorbidity 2;Past/Current Experience    Comorbidities peripheral neuropathy, HTN    Examination-Activity Limitations Bed Mobility;Bend;Lift;Stand;Stairs;Locomotion Level;Transfers    Examination-Participation Restrictions Community Activity;Interpersonal Relationship    Rehab Potential Good    PT Frequency 2x / week    PT Duration 6 weeks    PT Treatment/Interventions ADLs/Self Care Home Management;Cryotherapy;Electrical Stimulation;Iontophoresis 4mg /ml Dexamethasone;Moist Heat;Therapeutic activities;Functional mobility training;Stair training;Gait training;DME Instruction;Ultrasound;Therapeutic exercise;Balance training;Neuromuscular re-education;Patient/family education;Dry needling;Passive range of motion;Manual techniques;Wheelchair mobility training;Taping;Vasopneumatic Device    PT Next Visit Plan LE strength, dynamic balance, gait    Consulted and Agree with Plan of Care Patient           Patient will benefit from skilled therapeutic intervention in order to improve the following deficits and impairments:  Abnormal gait, Decreased endurance, Decreased activity tolerance, Decreased strength, Difficulty walking, Decreased mobility, Decreased balance, Improper body mechanics, Impaired flexibility, Decreased coordination, Decreased safety awareness, Pain  Visit Diagnosis: Other abnormalities of gait and mobility  Muscle weakness (generalized)  Difficulty in walking, not elsewhere classified  Chronic bilateral low back pain without sciatica     Problem List Patient Active Problem List   Diagnosis Date Noted  . Essential hypertension 12/22/2017  . History of prostate cancer 12/22/2017  . Glaucoma 12/22/2017  . Aortic stenosis 12/22/2017    . OA (osteoarthritis) 12/22/2017  . Abnormal LFTs 12/22/2017  . Thrombocytosis ([place]) 12/22/2017  . Normocytic anemia 12/22/2017  . Hyponatremia 12/22/2017  . Cellulitis and abscess of left leg 12/21/2017  . Left ankle swelling   . Acute respiratory failure with hypoxia ([place])   . Paroxysmal supraventricular tachycardia ([place])   . Sepsis ([place]) 12/06/2017  . Acute lower UTI 12/06/2017  . Tachycardia 12/06/2017  . Leukocytosis 12/06/2017    York Cerise, SPTA 11/12/2019, 12:35 PM  [place]  [address] [place], [place], [address] Phone: [phone]   Fax:  [phone]  Name: [employer] MRN: [id number] Date of Birth: 12-11-1931

## 2019-11-14 ENCOUNTER — Ambulatory Visit: Payer: Medicare HMO | Admitting: Physical Therapy

## 2019-11-14 ENCOUNTER — Other Ambulatory Visit: Payer: Self-pay

## 2019-11-14 DIAGNOSIS — M545 Low back pain, unspecified: Secondary | ICD-10-CM

## 2019-11-14 DIAGNOSIS — M6281 Muscle weakness (generalized): Secondary | ICD-10-CM

## 2019-11-14 DIAGNOSIS — G8929 Other chronic pain: Secondary | ICD-10-CM

## 2019-11-14 DIAGNOSIS — R262 Difficulty in walking, not elsewhere classified: Secondary | ICD-10-CM

## 2019-11-14 DIAGNOSIS — R2689 Other abnormalities of gait and mobility: Secondary | ICD-10-CM

## 2019-11-14 NOTE — Therapy (Signed)
[place] [address] [place], [place], [address] Phone: [phone]   Fax:  [phone]  Physical Therapy Treatment  Patient Details  Name: Jeffrey Porter MRN: [id number] Date of Birth: 03-19-1932 Referring Provider (PT): [name]   Encounter Date: 11/14/2019   PT End of Session - 11/14/19 1217    Visit Number 3    Date for PT Re-Evaluation 12/31/17    PT Start Time 1130    PT Stop Time 1215    PT Time Calculation (min) 45 min           Past Medical History:  Diagnosis Date  . Baker's cyst of knee, left   . Glaucoma   . Hypertension   . Neuromuscular disorder ([place])    peripheral neuropathy  . Prostate cancer [facility])     Past Surgical History:  Procedure Laterality Date  . BALLOON DILATION N/A 08/03/2019   Procedure: BALLOON DILATION;  Surgeon: Alexis Frock, MD;  [facility];  Service: Urology;  Laterality: N/A;  . CATARACT EXTRACTION    . CYSTOSCOPY WITH RETROGRADE URETHROGRAM N/A 08/03/2019   Procedure: CYSTOSCOPY WITH RETROGRADE URETHROGRAM;  Surgeon: Alexis Frock, MD;  [facility];  Service: Urology;  Laterality: N/A;  . HERNIA REPAIR    . INSERTION BRACHYTHERAPY DEVICE      There were no vitals filed for this visit.   Subjective Assessment - 11/14/19 1134    Subjective Patient reports feeling okay. No pain at rest.    Currently in Pain? No/denies              [facility] PT Assessment - 11/14/19 0001      ROM / Strength   AROM / PROM / Strength Strength      AROM   Overall AROM Comments LE WFL besides 4-/5 hip ext/add, knee flexion                         [facility] Adult PT Treatment/Exercise - 11/14/19 0001      Balance   Balance Assessed Yes      Static Standing Balance   Tandem Stance - Right Leg --   :30    Tandem Stance - Left Leg --   :30     Lumbar Exercises: Aerobic   Nustep l5 6 minutes      Knee/Hip Exercises: Standing   Hip Extension Both;1  set;10 reps    Extension Limitations 2#      Knee/Hip Exercises: Seated   Ball Squeeze x10 3 second hold    Hamstring Curl Strengthening;Both;2 sets;10 reps    Hamstring Limitations red tband    Sit to [place] 10 reps;with UE support;Other (comment)   uses back of legs to stabilize and fully extend                   PT Short Term Goals - 10/31/19 1757      PT SHORT TERM GOAL #1   Title Pt to be independent with initial HEP     Time 2    Period Weeks    Status New    Target Date 11/14/19             PT Long Term Goals - 11/14/19 1222      PT LONG TERM GOAL #1   Title Pt to demonstrate STS with UE support independently x5 to improve ability to transfer safely and perform household ADLs.  Baseline Uses back of legs to stabilize and reach full extension.    Status On-going      PT LONG TERM GOAL #2   Title Pt to demo improved strength of Bil LEs, to at least 4+/5 to improve stability and gait.     Baseline WFL besides Hip ext/add & knee flexion 4-/5    Status On-going                 Plan - 11/14/19 1217    Clinical Impression Statement Patient presenting with strength deficits in hip ext/add & knee flexion. Patient tolerated TE well. MIN A and HHA for transitioning between exercises. Patient uses back of legs and UE to assist S2S. VC and tactile cues for lumbar stabilization during standing hip extensions. Patient inquired about achilles tendon reflex-intact.    Personal Factors and Comorbidities Age;Comorbidity 2;Past/Current Experience    Comorbidities peripheral neuropathy, HTN    Examination-Activity Limitations Bed Mobility;Bend;Lift;Stand;Stairs;Locomotion Level;Transfers    Examination-Participation Restrictions Community Activity;Interpersonal Relationship    Rehab Potential Good    PT Frequency 2x / week    PT Duration 6 weeks    PT Treatment/Interventions ADLs/Self Care Home Management;Cryotherapy;Electrical Stimulation;Iontophoresis 4mg /ml  Dexamethasone;Moist Heat;Therapeutic activities;Functional mobility training;Stair training;Gait training;DME Instruction;Ultrasound;Therapeutic exercise;Balance training;Neuromuscular re-education;Patient/family education;Dry needling;Passive range of motion;Manual techniques;Wheelchair mobility training;Taping;Vasopneumatic Device    PT Next Visit Plan LE strength, dynamic balance, gait    Consulted and Agree with Plan of Care Patient           Patient will benefit from skilled therapeutic intervention in order to improve the following deficits and impairments:  Abnormal gait, Decreased endurance, Decreased activity tolerance, Decreased strength, Difficulty walking, Decreased mobility, Decreased balance, Improper body mechanics, Impaired flexibility, Decreased coordination, Decreased safety awareness, Pain  Visit Diagnosis: Other abnormalities of gait and mobility  Muscle weakness (generalized)  Difficulty in walking, not elsewhere classified  Chronic bilateral low back pain without sciatica     Problem List Patient Active Problem List   Diagnosis Date Noted  . Essential hypertension 12/22/2017  . History of prostate cancer 12/22/2017  . Glaucoma 12/22/2017  . Aortic stenosis 12/22/2017  . OA (osteoarthritis) 12/22/2017  . Abnormal LFTs 12/22/2017  . Thrombocytosis ([place]) 12/22/2017  . Normocytic anemia 12/22/2017  . Hyponatremia 12/22/2017  . Cellulitis and abscess of left leg 12/21/2017  . Left ankle swelling   . Acute respiratory failure with hypoxia ([place])   . Paroxysmal supraventricular tachycardia ([place])   . Sepsis ([place]) 12/06/2017  . Acute lower UTI 12/06/2017  . Tachycardia 12/06/2017  . Leukocytosis 12/06/2017    York Cerise, SPTA 11/14/2019, 12:23 PM  [place] [address] [place], [place], [address] Phone: [phone]   Fax:  [phone]  Name: Jeffrey Porter MRN: [id number] Date of Birth:  1932-03-26

## 2019-11-19 ENCOUNTER — Ambulatory Visit: Payer: Medicare HMO | Admitting: Physical Therapy

## 2019-11-19 ENCOUNTER — Other Ambulatory Visit: Payer: Self-pay

## 2019-11-19 DIAGNOSIS — M545 Low back pain, unspecified: Secondary | ICD-10-CM

## 2019-11-19 DIAGNOSIS — R262 Difficulty in walking, not elsewhere classified: Secondary | ICD-10-CM

## 2019-11-19 DIAGNOSIS — G8929 Other chronic pain: Secondary | ICD-10-CM | POA: Diagnosis not present

## 2019-11-19 DIAGNOSIS — M6281 Muscle weakness (generalized): Secondary | ICD-10-CM

## 2019-11-19 DIAGNOSIS — R2689 Other abnormalities of gait and mobility: Secondary | ICD-10-CM | POA: Diagnosis not present

## 2019-11-19 NOTE — Therapy (Signed)
[address] [place], [place], [address] Phone: [phone]   Fax:  [phone]  Physical Therapy Treatment  Patient Details  Name: Jeffrey Porter MRN: [id number] Date of Birth: June 24, 1931 Referring Provider (PT): [name]   Encounter Date: 11/19/2019   PT End of Session - 11/19/19 1230    Visit Number 4    Date for PT Re-Evaluation 12/31/17    PT Start Time 7628    PT Stop Time 1228    PT Time Calculation (min) 43 min    Equipment Utilized During Treatment Gait belt    Activity Tolerance Patient tolerated treatment well    Behavior During Therapy [facility] for tasks assessed/performed           Past Medical History:  Diagnosis Date  . Baker's cyst of knee, left   . Glaucoma   . Hypertension   . Neuromuscular disorder ([place])    peripheral neuropathy  . Prostate cancer [facility])     Past Surgical History:  Procedure Laterality Date  . BALLOON DILATION N/A 08/03/2019   Procedure: BALLOON DILATION;  Surgeon: Alexis Frock, MD;  [facility];  Service: Urology;  Laterality: N/A;  . CATARACT EXTRACTION    . CYSTOSCOPY WITH RETROGRADE URETHROGRAM N/A 08/03/2019   Procedure: CYSTOSCOPY WITH RETROGRADE URETHROGRAM;  Surgeon: Alexis Frock, MD;  [facility];  Service: Urology;  Laterality: N/A;  . HERNIA REPAIR    . INSERTION BRACHYTHERAPY DEVICE      There were no vitals filed for this visit.   Subjective Assessment - 11/19/19 1151    Subjective Patient reports feeling stiff today. He did his exercises last night and felt good, however, woke up feeling pretty tight.    Pertinent History peripheral neuropathy, HTN    Currently in Pain? No/denies                             [facility] Adult PT Treatment/Exercise - 11/19/19 0001      High Level Balance   High Level Balance Activities Marching forwards    High Level Balance Comments gait and cadence is fast/better when cueing for  "high knee and big step"      Lumbar Exercises: Aerobic   Nustep l5 6 minutes      Knee/Hip Exercises: Stretches   Active Hamstring Stretch Both;2 reps;30 seconds    Hip Flexor Stretch Both;2 reps;30 seconds      Knee/Hip Exercises: Seated   Long Arc Quad Both;2 sets;10 reps;Weights    Long Arc Quad Weight 3 lbs.    Marching Strengthening;Both;2 sets;10 reps;Weights    Marching Weights 3 lbs.      Manual Therapy   Manual Therapy Soft tissue mobilization    Manual therapy comments very tight    Soft tissue mobilization lower back                    PT Short Term Goals - 10/31/19 1757      PT SHORT TERM GOAL #1   Title Pt to be independent with initial HEP     Time 2    Period Weeks    Status New    Target Date 11/14/19             PT Long Term Goals - 11/14/19 1222      PT LONG TERM GOAL #1   Title Pt to demonstrate STS with  UE support independently x5 to improve ability to transfer safely and perform household ADLs.    Baseline Uses back of legs to stabilize and reach full extension.    Status On-going      PT LONG TERM GOAL #2   Title Pt to demo improved strength of Bil LEs, to at least 4+/5 to improve stability and gait.     Baseline WFL besides Hip ext/add & knee flexion 4-/5    Status On-going                 Plan - 11/19/19 1230    Clinical Impression Statement Patient tolerated progression of TE well. Tightness noted in BIL hamstrings, hipflexors, and quads. Weighted marches straight into gait helped increase cadence and LE advancement/clearing. "High knee and big step" cueing helped signifigantly with gait.    Personal Factors and Comorbidities Age;Comorbidity 2;Past/Current Experience    Comorbidities peripheral neuropathy, HTN    Examination-Activity Limitations Bed Mobility;Bend;Lift;Stand;Stairs;Locomotion Level;Transfers    Examination-Participation Restrictions Community Activity;Interpersonal Relationship    Rehab Potential Good     PT Frequency 2x / week    PT Duration 6 weeks    PT Treatment/Interventions ADLs/Self Care Home Management;Cryotherapy;Electrical Stimulation;Iontophoresis 4mg /ml Dexamethasone;Moist Heat;Therapeutic activities;Functional mobility training;Stair training;Gait training;DME Instruction;Ultrasound;Therapeutic exercise;Balance training;Neuromuscular re-education;Patient/family education;Dry needling;Passive range of motion;Manual techniques;Wheelchair mobility training;Taping;Vasopneumatic Device    PT Next Visit Plan Stretch, soft tissue work, dynamic balance, gait. Assess goals.    Consulted and Agree with Plan of Care Patient           Patient will benefit from skilled therapeutic intervention in order to improve the following deficits and impairments:  Abnormal gait, Decreased endurance, Decreased activity tolerance, Decreased strength, Difficulty walking, Decreased mobility, Decreased balance, Improper body mechanics, Impaired flexibility, Decreased coordination, Decreased safety awareness, Pain  Visit Diagnosis: Other abnormalities of gait and mobility  Muscle weakness (generalized)  Difficulty in walking, not elsewhere classified  Chronic bilateral low back pain without sciatica     Problem List Patient Active Problem List   Diagnosis Date Noted  . Essential hypertension 12/22/2017  . History of prostate cancer 12/22/2017  . Glaucoma 12/22/2017  . Aortic stenosis 12/22/2017  . OA (osteoarthritis) 12/22/2017  . Abnormal LFTs 12/22/2017  . Thrombocytosis ([place]) 12/22/2017  . Normocytic anemia 12/22/2017  . Hyponatremia 12/22/2017  . Cellulitis and abscess of left leg 12/21/2017  . Left ankle swelling   . Acute respiratory failure with hypoxia ([place])   . Paroxysmal supraventricular tachycardia ([place])   . Sepsis ([place]) 12/06/2017  . Acute lower UTI 12/06/2017  . Tachycardia 12/06/2017  . Leukocytosis 12/06/2017    York Cerise, SPTA 11/19/2019, 12:36 PM  [address] [place], [place], [address] Phone: [phone]   Fax:  [phone]  Name: Jeffrey Porter MRN: [id number] Date of Birth: Oct 19, 1931

## 2019-11-21 ENCOUNTER — Other Ambulatory Visit: Payer: Self-pay

## 2019-11-21 ENCOUNTER — Ambulatory Visit: Payer: Medicare HMO | Admitting: Physical Therapy

## 2019-11-21 ENCOUNTER — Encounter: Payer: Self-pay | Admitting: Physical Therapy

## 2019-11-21 DIAGNOSIS — M6281 Muscle weakness (generalized): Secondary | ICD-10-CM | POA: Diagnosis not present

## 2019-11-21 DIAGNOSIS — R2689 Other abnormalities of gait and mobility: Secondary | ICD-10-CM | POA: Diagnosis not present

## 2019-11-21 DIAGNOSIS — M545 Low back pain, unspecified: Secondary | ICD-10-CM

## 2019-11-21 DIAGNOSIS — R262 Difficulty in walking, not elsewhere classified: Secondary | ICD-10-CM | POA: Diagnosis not present

## 2019-11-21 DIAGNOSIS — G8929 Other chronic pain: Secondary | ICD-10-CM

## 2019-11-21 NOTE — Therapy (Signed)
[address] [place], [place], [address] Phone: [phone]   Fax:  [phone]  Physical Therapy Treatment  Patient Details  Name: Jeffrey Porter MRN: [id number] Date of Birth: 1931/05/05 Referring Provider (PT): [name]   Encounter Date: 11/21/2019   PT End of Session - 11/21/19 1228    Visit Number 5    Date for PT Re-Evaluation 12/31/17    PT Start Time 1140    PT Stop Time 1229    PT Time Calculation (min) 49 min    Activity Tolerance Patient tolerated treatment well    Behavior During Therapy [facility] for tasks assessed/performed           Past Medical History:  Diagnosis Date  . Baker's cyst of knee, left   . Glaucoma   . Hypertension   . Neuromuscular disorder ([place])    peripheral neuropathy  . Prostate cancer [facility])     Past Surgical History:  Procedure Laterality Date  . BALLOON DILATION N/A 08/03/2019   Procedure: BALLOON DILATION;  Surgeon: Alexis Frock, MD;  [facility];  Service: Urology;  Laterality: N/A;  . CATARACT EXTRACTION    . CYSTOSCOPY WITH RETROGRADE URETHROGRAM N/A 08/03/2019   Procedure: CYSTOSCOPY WITH RETROGRADE URETHROGRAM;  Surgeon: Alexis Frock, MD;  [facility];  Service: Urology;  Laterality: N/A;  . HERNIA REPAIR    . INSERTION BRACHYTHERAPY DEVICE      There were no vitals filed for this visit.   Subjective Assessment - 11/21/19 1141    Subjective "Feeling ok"    Currently in Pain? No/denies                             [facility] Adult PT Treatment/Exercise - 11/21/19 0001      Ambulation/Gait   Ambulation/Gait Assistance 5: Supervision    Ambulation Distance (Feet) 60 Feet    Assistive device --   rollator   Gait Pattern Decreased step length - left;Decreased step length - right;Decreased stride length;Decreased dorsiflexion - right;Shuffle    Ambulation Surface Level    Gait velocity decreased    Gait Comments Inconsistent  step length and step height,  Pt with significant shuffling with direction changes and turning around.       High Level Balance   High Level Balance Activities Side stepping   HHA x2     Lumbar Exercises: Aerobic   Nustep l3 6 minutes      Lumbar Exercises: Seated   Other Seated Lumbar Exercises rows blue 2x15      Knee/Hip Exercises: Standing   Other Standing Knee Exercises Standing marches with locked rolator 2x10       Knee/Hip Exercises: Seated   Long Arc Quad Both;2 sets;10 reps;Weights    Long Arc Quad Weight 2 lbs.    Ball Squeeze x10 3 second hold                    PT Short Term Goals - 10/31/19 1757      PT SHORT TERM GOAL #1   Title Pt to be independent with initial HEP     Time 2    Period Weeks    Status New    Target Date 11/14/19             PT Long Term Goals - 11/14/19 1222      PT LONG TERM GOAL #1  Title Pt to demonstrate STS with UE support independently x5 to improve ability to transfer safely and perform household ADLs.    Baseline Uses back of legs to stabilize and reach full extension.    Status On-going      PT LONG TERM GOAL #2   Title Pt to demo improved strength of Bil LEs, to at least 4+/5 to improve stability and gait.     Baseline WFL besides Hip ext/add & knee flexion 4-/5    Status On-going                 Plan - 11/21/19 1150    Clinical Impression Statement Pt ambulated into clinic of the first time requiring increase time. Pt to ~ 7 minute to ambulate from lobby to NuStep machine switching from a step to to a stp through pattern. Pt uses a rollator with the rear wheels locked. Increase time needed for all mobility. Pt often stands needing increase time to initiate step. no real progress made towards goals. he did do well with standing march lifting LE. Postural cues needed to prevent posterior lean with LAQ and seated rows.    Comorbidities peripheral neuropathy, HTN    Examination-Activity Limitations Bed  Mobility;Bend;Lift;Stand;Stairs;Locomotion Level;Transfers    Examination-Participation Restrictions Community Activity;Interpersonal Relationship    Stability/Clinical Decision Making Stable/Uncomplicated    Rehab Potential Good    PT Frequency 2x / week    PT Treatment/Interventions ADLs/Self Care Home Management;Cryotherapy;Electrical Stimulation;Iontophoresis 4mg /ml Dexamethasone;Moist Heat;Therapeutic activities;Functional mobility training;Stair training;Gait training;DME Instruction;Ultrasound;Therapeutic exercise;Balance training;Neuromuscular re-education;Patient/family education;Dry needling;Passive range of motion;Manual techniques;Wheelchair mobility training;Taping;Vasopneumatic Device    PT Next Visit Plan Stretch, soft tissue work, dynamic balance, gait. Assess goals.           Patient will benefit from skilled therapeutic intervention in order to improve the following deficits and impairments:  Abnormal gait, Decreased endurance, Decreased activity tolerance, Decreased strength, Difficulty walking, Decreased mobility, Decreased balance, Improper body mechanics, Impaired flexibility, Decreased coordination, Decreased safety awareness, Pain  Visit Diagnosis: Difficulty in walking, not elsewhere classified  Chronic bilateral low back pain without sciatica  Muscle weakness (generalized)  Other abnormalities of gait and mobility     Problem List Patient Active Problem List   Diagnosis Date Noted  . Essential hypertension 12/22/2017  . History of prostate cancer 12/22/2017  . Glaucoma 12/22/2017  . Aortic stenosis 12/22/2017  . OA (osteoarthritis) 12/22/2017  . Abnormal LFTs 12/22/2017  . Thrombocytosis ([place]) 12/22/2017  . Normocytic anemia 12/22/2017  . Hyponatremia 12/22/2017  . Cellulitis and abscess of left leg 12/21/2017  . Left ankle swelling   . Acute respiratory failure with hypoxia ([place])   . Paroxysmal supraventricular tachycardia ([place])   . Sepsis ([place])  12/06/2017  . Acute lower UTI 12/06/2017  . Tachycardia 12/06/2017  . Leukocytosis 12/06/2017    Scot Jun, PTA 11/21/2019, 12:31 PM  [address] [place], [place], [address] Phone: [phone]   Fax:  [phone]  Name: Daysean Tinkham MRN: [id number] Date of Birth: 1932-04-09

## 2019-11-23 DIAGNOSIS — Z0000 Encounter for general adult medical examination without abnormal findings: Secondary | ICD-10-CM | POA: Diagnosis not present

## 2019-11-23 DIAGNOSIS — R2689 Other abnormalities of gait and mobility: Secondary | ICD-10-CM | POA: Diagnosis not present

## 2019-11-23 DIAGNOSIS — Z6827 Body mass index (BMI) 27.0-27.9, adult: Secondary | ICD-10-CM | POA: Diagnosis not present

## 2019-11-23 DIAGNOSIS — Z8546 Personal history of malignant neoplasm of prostate: Secondary | ICD-10-CM | POA: Diagnosis not present

## 2019-11-23 DIAGNOSIS — I350 Nonrheumatic aortic (valve) stenosis: Secondary | ICD-10-CM | POA: Diagnosis not present

## 2019-11-23 DIAGNOSIS — G214 Vascular parkinsonism: Secondary | ICD-10-CM | POA: Diagnosis not present

## 2019-11-26 ENCOUNTER — Ambulatory Visit: Payer: Medicare HMO | Attending: Orthopedic Surgery | Admitting: Physical Therapy

## 2019-11-26 ENCOUNTER — Other Ambulatory Visit: Payer: Self-pay

## 2019-11-26 ENCOUNTER — Encounter: Payer: Self-pay | Admitting: Physical Therapy

## 2019-11-26 DIAGNOSIS — G8929 Other chronic pain: Secondary | ICD-10-CM | POA: Diagnosis not present

## 2019-11-26 DIAGNOSIS — M545 Low back pain, unspecified: Secondary | ICD-10-CM

## 2019-11-26 DIAGNOSIS — M6281 Muscle weakness (generalized): Secondary | ICD-10-CM

## 2019-11-26 DIAGNOSIS — R2689 Other abnormalities of gait and mobility: Secondary | ICD-10-CM | POA: Insufficient documentation

## 2019-11-26 DIAGNOSIS — R262 Difficulty in walking, not elsewhere classified: Secondary | ICD-10-CM

## 2019-11-26 NOTE — Therapy (Signed)
[address] [place], [place], [address] Phone: [phone]   Fax:  [phone]  Physical Therapy Treatment  Patient Details  Name: Jeffrey Porter MRN: [id number] Date of Birth: [birth year] Referring Provider (PT): [name]   Encounter Date: 11/26/2019   PT End of Session - 11/26/19 1234    Visit Number 6    PT Start Time 5809    PT Stop Time 1230    PT Time Calculation (min) 47 min    Behavior During Therapy [facility] for tasks assessed/performed           Past Medical History:  Diagnosis Date  . Baker's cyst of knee, left   . Glaucoma   . Hypertension   . Neuromuscular disorder ([place])    peripheral neuropathy  . Prostate cancer [facility])     Past Surgical History:  Procedure Laterality Date  . BALLOON DILATION N/A 08/03/2019   Procedure: BALLOON DILATION;  Surgeon: Alexis Frock, MD;  [facility];  Service: Urology;  Laterality: N/A;  . CATARACT EXTRACTION    . CYSTOSCOPY WITH RETROGRADE URETHROGRAM N/A 08/03/2019   Procedure: CYSTOSCOPY WITH RETROGRADE URETHROGRAM;  Surgeon: Alexis Frock, MD;  [facility];  Service: Urology;  Laterality: N/A;  . HERNIA REPAIR    . INSERTION BRACHYTHERAPY DEVICE      There were no vitals filed for this visit.   Subjective Assessment - 11/26/19 1144    Subjective "Ok"    Pertinent History peripheral neuropathy, HTN    Currently in Pain? No/denies                             [facility] Adult PT Treatment/Exercise - 11/26/19 0001      High Level Balance   High Level Balance Activities Side stepping      Lumbar Exercises: Aerobic   Nustep l3 6 minutes      Lumbar Exercises: Standing   Shoulder Extension 15 reps;Power Tower;Strengthening;Both   x2   Shoulder Extension Limitations 5    Other Standing Lumbar Exercises Alt 4 in box taps 3x10 rolator      Knee/Hip Exercises: Machines for Strengthening   Cybex Knee Flexion 20lb 2x10                      PT Short Term Goals - 10/31/19 1757      PT SHORT TERM GOAL #1   Title Pt to be independent with initial HEP     Time 2    Period Weeks    Status New    Target Date 11/14/19             PT Long Term Goals - 11/14/19 1222      PT LONG TERM GOAL #1   Title Pt to demonstrate STS with UE support independently x5 to improve ability to transfer safely and perform household ADLs.    Baseline Uses back of legs to stabilize and reach full extension.    Status On-going      PT LONG TERM GOAL #2   Title Pt to demo improved strength of Bil LEs, to at least 4+/5 to improve stability and gait.     Baseline WFL besides Hip ext/add & knee flexion 4-/5    Status On-going                 Plan - 11/26/19 1235  Clinical Impression Statement Again pt ambulated in clinic again requiring increase time. Pt required 4 min to ambulate 35 feet with locked Rolator. Increase needed for mobility between each exercise intervention. Pt can have a normal gait for 3 to four step interrupted by a short LLE step making pt stop. pt takes a lot of time to initiate step to start gait again despite cues. He did to well with the new exercises today.    Personal Factors and Comorbidities Age;Comorbidity 2;Past/Current Experience    Comorbidities peripheral neuropathy, HTN    Examination-Activity Limitations Bed Mobility;Bend;Lift;Stand;Stairs;Locomotion Level;Transfers    Examination-Participation Restrictions Community Activity;Interpersonal Relationship    Stability/Clinical Decision Making Stable/Uncomplicated    Rehab Potential Good    PT Frequency 2x / week    PT Duration 6 weeks    PT Treatment/Interventions ADLs/Self Care Home Management;Cryotherapy;Electrical Stimulation;Iontophoresis 4mg /ml Dexamethasone;Moist Heat;Therapeutic activities;Functional mobility training;Stair training;Gait training;DME Instruction;Ultrasound;Therapeutic exercise;Balance training;Neuromuscular  re-education;Patient/family education;Dry needling;Passive range of motion;Manual techniques;Wheelchair mobility training;Taping;Vasopneumatic Device    PT Next Visit Plan Stretch, soft tissue work, dynamic balance, gait. Assess goals.           Patient will benefit from skilled therapeutic intervention in order to improve the following deficits and impairments:  Abnormal gait, Decreased endurance, Decreased activity tolerance, Decreased strength, Difficulty walking, Decreased mobility, Decreased balance, Improper body mechanics, Impaired flexibility, Decreased coordination, Decreased safety awareness, Pain  Visit Diagnosis: Muscle weakness (generalized)  Other abnormalities of gait and mobility  Difficulty in walking, not elsewhere classified  Chronic bilateral low back pain without sciatica     Problem List Patient Active Problem List   Diagnosis Date Noted  . Essential hypertension 12/22/2017  . History of prostate cancer 12/22/2017  . Glaucoma 12/22/2017  . Aortic stenosis 12/22/2017  . OA (osteoarthritis) 12/22/2017  . Abnormal LFTs 12/22/2017  . Thrombocytosis ([place]) 12/22/2017  . Normocytic anemia 12/22/2017  . Hyponatremia 12/22/2017  . Cellulitis and abscess of left leg 12/21/2017  . Left ankle swelling   . Acute respiratory failure with hypoxia ([place])   . Paroxysmal supraventricular tachycardia ([place])   . Sepsis ([place]) 12/06/2017  . Acute lower UTI 12/06/2017  . Tachycardia 12/06/2017  . Leukocytosis 12/06/2017    Scot Jun, PTA 11/26/2019, 12:39 PM  [address] [place], [place], [address] Phone: [phone]   Fax:  [phone]  Name: Jeffrey Porter MRN: [id number] Date of Birth: [birth year]

## 2019-11-28 ENCOUNTER — Ambulatory Visit: Payer: Medicare HMO | Admitting: Physical Therapy

## 2019-11-28 ENCOUNTER — Encounter: Payer: Self-pay | Admitting: Physical Therapy

## 2019-11-28 ENCOUNTER — Other Ambulatory Visit: Payer: Self-pay

## 2019-11-28 DIAGNOSIS — R2689 Other abnormalities of gait and mobility: Secondary | ICD-10-CM

## 2019-11-28 DIAGNOSIS — M545 Low back pain, unspecified: Secondary | ICD-10-CM

## 2019-11-28 DIAGNOSIS — M6281 Muscle weakness (generalized): Secondary | ICD-10-CM

## 2019-11-28 DIAGNOSIS — R262 Difficulty in walking, not elsewhere classified: Secondary | ICD-10-CM | POA: Diagnosis not present

## 2019-11-28 DIAGNOSIS — G8929 Other chronic pain: Secondary | ICD-10-CM

## 2019-11-28 NOTE — Therapy (Signed)
[place] [address] [place], [place], [address] Phone: [phone]   Fax:  [phone]  Physical Therapy Treatment  Patient Details  Name: Jeffrey Porter MRN: [id number] Date of Birth: [birth year] Referring Provider (PT): [name]   Encounter Date: 11/28/2019   PT End of Session - 11/28/19 1221    Visit Number 7    Date for PT Re-Evaluation 12/31/17    Authorization Type [employer]    PT Start Time 1140    PT Stop Time 1220    PT Time Calculation (min) 40 min    Activity Tolerance Patient tolerated treatment well    Behavior During Therapy [facility] for tasks assessed/performed           Past Medical History:  Diagnosis Date  . Baker's cyst of knee, left   . Glaucoma   . Hypertension   . Neuromuscular disorder ([place])    peripheral neuropathy  . Prostate cancer [facility])     Past Surgical History:  Procedure Laterality Date  . BALLOON DILATION N/A 08/03/2019   Procedure: BALLOON DILATION;  Surgeon: Alexis Frock, MD;  [facility];  Service: Urology;  Laterality: N/A;  . CATARACT EXTRACTION    . CYSTOSCOPY WITH RETROGRADE URETHROGRAM N/A 08/03/2019   Procedure: CYSTOSCOPY WITH RETROGRADE URETHROGRAM;  Surgeon: Alexis Frock, MD;  [facility];  Service: Urology;  Laterality: N/A;  . HERNIA REPAIR    . INSERTION BRACHYTHERAPY DEVICE      There were no vitals filed for this visit.   Subjective Assessment - 11/28/19 1147    Subjective "That machine really helped me last time" Feeling good    Currently in Pain? No/denies                             [facility] Adult PT Treatment/Exercise - 11/28/19 0001      Lumbar Exercises: Aerobic   Nustep L4 4 minutes      Lumbar Exercises: Standing   Shoulder Extension 15 reps;Power Tower;Strengthening;Both   x2   Shoulder Extension Limitations 5      Knee/Hip Exercises: Machines for Strengthening   Cybex Knee Extension 5lb 3x10     Cybex  Knee Flexion 20lb 3x10      Knee/Hip Exercises: Standing   Forward Step Up Both;1 set;5 reps;Step Height: 4";Hand Hold: 2                    PT Short Term Goals - 10/31/19 1757      PT SHORT TERM GOAL #1   Title Pt to be independent with initial HEP     Time 2    Period Weeks    Status New    Target Date 11/14/19             PT Long Term Goals - 11/28/19 1223      PT LONG TERM GOAL #1   Title Pt to demonstrate STS with UE support independently x5 to improve ability to transfer safely and perform household ADLs.    Status On-going      PT LONG TERM GOAL #2   Title Pt to demo improved strength of Bil LEs, to at least 4+/5 to improve stability and gait.     Status On-going      PT LONG TERM GOAL #3   Title Pt to demo safe ambulation with LRAD, with consistent ability for proper stride  length and step height, for at least 261ft, to improve safety and fall risk.     Status On-going                 Plan - 11/28/19 1221    Clinical Impression Statement Again increase time needed for all mobility. Today pt requires 4 minutes 43 seconds to ambulate from lobby to NuStep machine. He was happy with last treatment and wanted to do th same exercises again. Cues to maintain stability with extensions. Progressed to step ups with bilateral  UE assist.    Personal Factors and Comorbidities Age;Comorbidity 2;Past/Current Experience    Comorbidities peripheral neuropathy, HTN    Examination-Activity Limitations Bed Mobility;Bend;Lift;Stand;Stairs;Locomotion Level;Transfers    Stability/Clinical Decision Making Stable/Uncomplicated    Rehab Potential Good    PT Duration 6 weeks    PT Treatment/Interventions ADLs/Self Care Home Management;Cryotherapy;Electrical Stimulation;Iontophoresis 4mg /ml Dexamethasone;Moist Heat;Therapeutic activities;Functional mobility training;Stair training;Gait training;DME Instruction;Ultrasound;Therapeutic exercise;Balance training;Neuromuscular  re-education;Patient/family education;Dry needling;Passive range of motion;Manual techniques;Wheelchair mobility training;Taping;Vasopneumatic Device           Patient will benefit from skilled therapeutic intervention in order to improve the following deficits and impairments:  Abnormal gait, Decreased endurance, Decreased activity tolerance, Decreased strength, Difficulty walking, Decreased mobility, Decreased balance, Improper body mechanics, Impaired flexibility, Decreased coordination, Decreased safety awareness, Pain  Visit Diagnosis: Chronic bilateral low back pain without sciatica  Difficulty in walking, not elsewhere classified  Muscle weakness (generalized)  Other abnormalities of gait and mobility     Problem List Patient Active Problem List   Diagnosis Date Noted  . Essential hypertension 12/22/2017  . History of prostate cancer 12/22/2017  . Glaucoma 12/22/2017  . Aortic stenosis 12/22/2017  . OA (osteoarthritis) 12/22/2017  . Abnormal LFTs 12/22/2017  . Thrombocytosis ([place]) 12/22/2017  . Normocytic anemia 12/22/2017  . Hyponatremia 12/22/2017  . Cellulitis and abscess of left leg 12/21/2017  . Left ankle swelling   . Acute respiratory failure with hypoxia ([place])   . Paroxysmal supraventricular tachycardia ([place])   . Sepsis ([place]) 12/06/2017  . Acute lower UTI 12/06/2017  . Tachycardia 12/06/2017  . Leukocytosis 12/06/2017    Scot Jun, PTA 11/28/2019, 12:24 PM  [place] [address] [place], [place], [address] Phone: [phone]   Fax:  [phone]  Name: Jeffrey Porter MRN: [id number] Date of Birth: 08-18-1931

## 2019-11-30 DIAGNOSIS — Z961 Presence of intraocular lens: Secondary | ICD-10-CM | POA: Diagnosis not present

## 2019-12-05 ENCOUNTER — Encounter: Payer: Self-pay | Admitting: Physical Therapy

## 2019-12-05 ENCOUNTER — Other Ambulatory Visit: Payer: Self-pay

## 2019-12-05 ENCOUNTER — Ambulatory Visit: Payer: Medicare HMO | Admitting: Physical Therapy

## 2019-12-05 DIAGNOSIS — M545 Low back pain, unspecified: Secondary | ICD-10-CM

## 2019-12-05 DIAGNOSIS — R262 Difficulty in walking, not elsewhere classified: Secondary | ICD-10-CM

## 2019-12-05 DIAGNOSIS — G8929 Other chronic pain: Secondary | ICD-10-CM

## 2019-12-05 DIAGNOSIS — M6281 Muscle weakness (generalized): Secondary | ICD-10-CM | POA: Diagnosis not present

## 2019-12-05 DIAGNOSIS — R2689 Other abnormalities of gait and mobility: Secondary | ICD-10-CM | POA: Diagnosis not present

## 2019-12-05 NOTE — Therapy (Signed)
[address] [place], [place], [address] Phone: [phone]   Fax:  [phone]  Physical Therapy Treatment  Patient Details  Name: Jeffrey Porter MRN: [id number] Date of Birth: Jan 30, 1932 Referring Provider (PT): [name]   Encounter Date: 12/05/2019   PT End of Session - 12/05/19 1146    Visit Number 8    Date for PT Re-Evaluation 12/31/17    PT Start Time 1057    PT Stop Time 1144    PT Time Calculation (min) 47 min    Activity Tolerance Patient tolerated treatment well    Behavior During Therapy [facility] for tasks assessed/performed           Past Medical History:  Diagnosis Date  . Baker's cyst of knee, left   . Glaucoma   . Hypertension   . Neuromuscular disorder ([place])    peripheral neuropathy  . Prostate cancer [facility])     Past Surgical History:  Procedure Laterality Date  . BALLOON DILATION N/A 08/03/2019   Procedure: BALLOON DILATION;  Surgeon: Alexis Frock, MD;  [facility];  Service: Urology;  Laterality: N/A;  . CATARACT EXTRACTION    . CYSTOSCOPY WITH RETROGRADE URETHROGRAM N/A 08/03/2019   Procedure: CYSTOSCOPY WITH RETROGRADE URETHROGRAM;  Surgeon: Alexis Frock, MD;  [facility];  Service: Urology;  Laterality: N/A;  . HERNIA REPAIR    . INSERTION BRACHYTHERAPY DEVICE      There were no vitals filed for this visit.   Subjective Assessment - 12/05/19 1054    Subjective Pt reports that he is feeling okay today, no pain    Currently in Pain? No/denies                             [facility] Adult PT Treatment/Exercise - 12/05/19 0001      Ambulation/Gait   Gait Comments Inconsistent step length and step height,  Pt with significant shuffling with direction changes and turning around.       Lumbar Exercises: Aerobic   Nustep L4 x 7 min      Knee/Hip Exercises: Machines for Strengthening   Cybex Knee Extension 5lb 3x10     Cybex Knee Flexion 20lb 2x10    pt requested not to complete 3rd set d/t pain in hamstring     Knee/Hip Exercises: Seated   Long Arc Quad Both;1 set;10 reps    Ball Squeeze x20 3 sec hold    Clamshell with TheraBand Red   2x10   Marching Both;1 set;10 reps                    PT Short Term Goals - 12/05/19 1104      PT SHORT TERM GOAL #1   Title Pt to be independent with initial HEP     Time 2    Period Weeks    Status Achieved    Target Date 11/14/19             PT Long Term Goals - 12/05/19 1105      PT LONG TERM GOAL #1   Title Pt to demonstrate STS with UE support independently x5 to improve ability to transfer safely and perform household ADLs.    Status On-going      PT LONG TERM GOAL #2   Title Pt to demo improved strength of Bil LEs, to at least 4+/5 to improve stability and gait.  Status On-going      PT LONG TERM GOAL #3   Title Pt to demo safe ambulation with LRAD, with consistent ability for proper stride length and step height, for at least 272ft, to improve safety and fall risk.     Status On-going                 Plan - 12/05/19 1147    Clinical Impression Statement Pt required increased time for mobility/transfers. Inconsistent step length and shuffling gait with freezing and difficulty initiating. Pt did well with machine interventions; only completed 2 sets of knee flexion d/t pt reporting cramp in hamstrings. Today pt required 6 min from lobby to NuStep and 2 min 47 sec from nustep to lobby at end of session.    PT Treatment/Interventions ADLs/Self Care Home Management;Cryotherapy;Electrical Stimulation;Iontophoresis 4mg /ml Dexamethasone;Moist Heat;Therapeutic activities;Functional mobility training;Stair training;Gait training;DME Instruction;Ultrasound;Therapeutic exercise;Balance training;Neuromuscular re-education;Patient/family education;Dry needling;Passive range of motion;Manual techniques;Wheelchair mobility training;Taping;Vasopneumatic Device    PT Next  Visit Plan Stretch, soft tissue work, dynamic balance, gait. Assess goals.    Consulted and Agree with Plan of Care Patient           Patient will benefit from skilled therapeutic intervention in order to improve the following deficits and impairments:  Abnormal gait, Decreased endurance, Decreased activity tolerance, Decreased strength, Difficulty walking, Decreased mobility, Decreased balance, Improper body mechanics, Impaired flexibility, Decreased coordination, Decreased safety awareness, Pain  Visit Diagnosis: Chronic bilateral low back pain without sciatica  Difficulty in walking, not elsewhere classified  Muscle weakness (generalized)  Other abnormalities of gait and mobility     Problem List Patient Active Problem List   Diagnosis Date Noted  . Essential hypertension 12/22/2017  . History of prostate cancer 12/22/2017  . Glaucoma 12/22/2017  . Aortic stenosis 12/22/2017  . OA (osteoarthritis) 12/22/2017  . Abnormal LFTs 12/22/2017  . Thrombocytosis ([place]) 12/22/2017  . Normocytic anemia 12/22/2017  . Hyponatremia 12/22/2017  . Cellulitis and abscess of left leg 12/21/2017  . Left ankle swelling   . Acute respiratory failure with hypoxia ([place])   . Paroxysmal supraventricular tachycardia ([place])   . Sepsis ([place]) 12/06/2017  . Acute lower UTI 12/06/2017  . Tachycardia 12/06/2017  . Leukocytosis 12/06/2017   Amador Cunas, PT, DPT Donald Prose Stephanos Fan 12/05/2019, 11:49 AM  [address] [place], [place], [address] Phone: [phone]   Fax:  [phone]  Name: Jeffrey Porter MRN: [id number] Date of Birth: 1931-07-13

## 2019-12-07 ENCOUNTER — Ambulatory Visit: Payer: Medicare HMO | Admitting: Physical Therapy

## 2019-12-07 ENCOUNTER — Other Ambulatory Visit: Payer: Self-pay

## 2019-12-07 ENCOUNTER — Encounter: Payer: Self-pay | Admitting: Physical Therapy

## 2019-12-07 DIAGNOSIS — M545 Low back pain, unspecified: Secondary | ICD-10-CM

## 2019-12-07 DIAGNOSIS — R262 Difficulty in walking, not elsewhere classified: Secondary | ICD-10-CM

## 2019-12-07 DIAGNOSIS — G8929 Other chronic pain: Secondary | ICD-10-CM | POA: Diagnosis not present

## 2019-12-07 DIAGNOSIS — M6281 Muscle weakness (generalized): Secondary | ICD-10-CM | POA: Diagnosis not present

## 2019-12-07 DIAGNOSIS — R2689 Other abnormalities of gait and mobility: Secondary | ICD-10-CM

## 2019-12-07 NOTE — Therapy (Signed)
[address] [place], [place], [address] Phone: [phone]   Fax:  [phone]  Physical Therapy Treatment  Patient Details  Name: Jeffrey Porter MRN: [id number] Date of Birth: Jan 06, 1932 Referring Provider (PT): [name]   Encounter Date: 12/07/2019   PT End of Session - 12/07/19 1146    Visit Number 9    Date for PT Re-Evaluation 12/31/17    Authorization Type [employer]na Medicare    PT Start Time 1100    PT Stop Time 1145    PT Time Calculation (min) 45 min    Activity Tolerance Patient tolerated treatment well    Behavior During Therapy [facility] for tasks assessed/performed           Past Medical History:  Diagnosis Date  . Baker's cyst of knee, left   . Glaucoma   . Hypertension   . Neuromuscular disorder ([place])    peripheral neuropathy  . Prostate cancer [facility])     Past Surgical History:  Procedure Laterality Date  . BALLOON DILATION N/A 08/03/2019   Procedure: BALLOON DILATION;  Surgeon: Alexis Frock, MD;  [facility];  Service: Urology;  Laterality: N/A;  . CATARACT EXTRACTION    . CYSTOSCOPY WITH RETROGRADE URETHROGRAM N/A 08/03/2019   Procedure: CYSTOSCOPY WITH RETROGRADE URETHROGRAM;  Surgeon: Alexis Frock, MD;  [facility];  Service: Urology;  Laterality: N/A;  . HERNIA REPAIR    . INSERTION BRACHYTHERAPY DEVICE      There were no vitals filed for this visit.   Subjective Assessment - 12/07/19 1106    Subjective Feeling ok    Currently in Pain? No/denies                             [facility] Adult PT Treatment/Exercise - 12/07/19 0001      Ambulation/Gait   Gait Comments Inconsistent step length and step height,  Pt with significant shuffling with direction changes and turning around.       Lumbar Exercises: Aerobic   Nustep L4 x 7 min   some Lateral L knee pain     Knee/Hip Exercises: Machines for Strengthening   Cybex Knee Extension 5lb 2x10     Cybex  Knee Flexion 20lb 2x10      Knee/Hip Exercises: Standing   Forward Step Up Both;1 set;5 reps;Hand Hold: 2;Step Height: 6"      Knee/Hip Exercises: Seated   Long Arc Quad Strengthening;Both;2 sets;10 reps   Alt    Long Arc Quad Weight 2 lbs.    Ball Squeeze x20 3 sec hold    Marching Both;10 reps;2 sets    Marching Limitations 2lb          Standing UE reaches x10 each           PT Short Term Goals - 12/05/19 1104      PT SHORT TERM GOAL #1   Title Pt to be independent with initial HEP     Time 2    Period Weeks    Status Achieved    Target Date 11/14/19             PT Long Term Goals - 12/07/19 1147      PT LONG TERM GOAL #1   Title Pt to demonstrate STS with UE support independently x5 to improve ability to transfer safely and perform household ADLs.    Status On-going  Plan - 12/07/19 1147    Clinical Impression Statement Pt did well overall today, progressing to six inch step ups with bilat UE support. Inconsistent step length remains but was able to ambulate into clinic quicker only requiring 3 minutes from lobby to NuStep. Some stiffness noted with trunk rotations with UE reach's.    Personal Factors and Comorbidities Age;Comorbidity 2;Past/Current Experience    Comorbidities peripheral neuropathy, HTN    Examination-Activity Limitations Bed Mobility;Bend;Lift;Stand;Stairs;Locomotion Level;Transfers    Examination-Participation Restrictions Community Activity;Interpersonal Relationship    Stability/Clinical Decision Making Stable/Uncomplicated    Rehab Potential Good    PT Frequency 2x / week    PT Duration 6 weeks    PT Treatment/Interventions ADLs/Self Care Home Management;Cryotherapy;Electrical Stimulation;Iontophoresis 4mg /ml Dexamethasone;Moist Heat;Therapeutic activities;Functional mobility training;Stair training;Gait training;DME Instruction;Ultrasound;Therapeutic exercise;Balance training;Neuromuscular  re-education;Patient/family education;Dry needling;Passive range of motion;Manual techniques;Wheelchair mobility training;Taping;Vasopneumatic Device    PT Next Visit Plan Stretch, soft tissue work, dynamic balance, gait. Assess goals.           Patient will benefit from skilled therapeutic intervention in order to improve the following deficits and impairments:  Abnormal gait, Decreased endurance, Decreased activity tolerance, Decreased strength, Difficulty walking, Decreased mobility, Decreased balance, Improper body mechanics, Impaired flexibility, Decreased coordination, Decreased safety awareness, Pain  Visit Diagnosis: Difficulty in walking, not elsewhere classified  Muscle weakness (generalized)  Other abnormalities of gait and mobility  Chronic bilateral low back pain without sciatica     Problem List Patient Active Problem List   Diagnosis Date Noted  . Essential hypertension 12/22/2017  . History of prostate cancer 12/22/2017  . Glaucoma 12/22/2017  . Aortic stenosis 12/22/2017  . OA (osteoarthritis) 12/22/2017  . Abnormal LFTs 12/22/2017  . Thrombocytosis ([place]) 12/22/2017  . Normocytic anemia 12/22/2017  . Hyponatremia 12/22/2017  . Cellulitis and abscess of left leg 12/21/2017  . Left ankle swelling   . Acute respiratory failure with hypoxia ([place])   . Paroxysmal supraventricular tachycardia ([place])   . Sepsis ([place]) 12/06/2017  . Acute lower UTI 12/06/2017  . Tachycardia 12/06/2017  . Leukocytosis 12/06/2017    Scot Jun, PTA 12/07/2019, 11:52 AM  [address] [place], [place], [address] Phone: [phone]   Fax:  [phone]  Name: Aman Bonet MRN: [id number] Date of Birth: 06/04/1931

## 2019-12-08 DIAGNOSIS — Z0100 Encounter for examination of eyes and vision without abnormal findings: Secondary | ICD-10-CM | POA: Diagnosis not present

## 2019-12-10 ENCOUNTER — Other Ambulatory Visit: Payer: Self-pay

## 2019-12-10 ENCOUNTER — Ambulatory Visit: Payer: Medicare HMO | Admitting: Physical Therapy

## 2019-12-10 ENCOUNTER — Encounter: Payer: Self-pay | Admitting: Physical Therapy

## 2019-12-10 DIAGNOSIS — G8929 Other chronic pain: Secondary | ICD-10-CM

## 2019-12-10 DIAGNOSIS — M545 Low back pain, unspecified: Secondary | ICD-10-CM

## 2019-12-10 DIAGNOSIS — R2689 Other abnormalities of gait and mobility: Secondary | ICD-10-CM

## 2019-12-10 DIAGNOSIS — R262 Difficulty in walking, not elsewhere classified: Secondary | ICD-10-CM | POA: Diagnosis not present

## 2019-12-10 DIAGNOSIS — M6281 Muscle weakness (generalized): Secondary | ICD-10-CM

## 2019-12-10 NOTE — Therapy (Signed)
[address] [place], [place], [address] Phone: [phone]   Fax:  [phone] Progress Note Reporting Period 10/31/19 to 12/10/19 for visits 1-10  See note below for Objective Data and Assessment of Progress/Goals.      Physical Therapy Treatment  Patient Details  Name: Jeffrey Porter MRN: [id number] Date of Birth: 1931/11/14 Referring Provider (PT): [name]   Encounter Date: 12/10/2019   PT End of Session - 12/10/19 1233    Visit Number 10    Date for PT Re-Evaluation 12/31/17    Authorization Type [employer]etna Medicare    PT Start Time 1145    PT Stop Time 1230    PT Time Calculation (min) 45 min    Activity Tolerance Patient tolerated treatment well    Behavior During Therapy [facility] for tasks assessed/performed           Past Medical History:  Diagnosis Date  . Baker's cyst of knee, left   . Glaucoma   . Hypertension   . Neuromuscular disorder ([place])    peripheral neuropathy  . Prostate cancer [facility])     Past Surgical History:  Procedure Laterality Date  . BALLOON DILATION N/A 08/03/2019   Procedure: BALLOON DILATION;  Surgeon: Alexis Frock, MD;  [facility];  Service: Urology;  Laterality: N/A;  . CATARACT EXTRACTION    . CYSTOSCOPY WITH RETROGRADE URETHROGRAM N/A 08/03/2019   Procedure: CYSTOSCOPY WITH RETROGRADE URETHROGRAM;  Surgeon: Alexis Frock, MD;  [facility];  Service: Urology;  Laterality: N/A;  . HERNIA REPAIR    . INSERTION BRACHYTHERAPY DEVICE      There were no vitals filed for this visit.   Subjective Assessment - 12/10/19 1146    Subjective "Ok"    Currently in Pain? Yes    Pain Score 5     Pain Location Leg    Pain Orientation Left                             [facility] Adult PT Treatment/Exercise - 12/10/19 0001      Lumbar Exercises: Aerobic   Recumbent Bike x 3 min    Nustep L2 x 2 min LE only, L4 x4 min LE/UE      Knee/Hip Exercises:  Standing   Other Standing Knee Exercises LLE hipflex with SPC in RUE 2x5    Other Standing Knee Exercises Forward kicks x 10 each with rollator      Knee/Hip Exercises: Seated   Long Arc Quad Strengthening;Both;2 sets;10 reps    Long Arc Quad Weight 3 lbs.                    PT Short Term Goals - 12/05/19 1104      PT SHORT TERM GOAL #1   Title Pt to be independent with initial HEP     Time 2    Period Weeks    Status Achieved    Target Date 11/14/19             PT Long Term Goals - 12/10/19 1236      PT LONG TERM GOAL #1   Title Pt to demonstrate STS with UE support independently x5 to improve ability to transfer safely and perform household ADLs.    Status On-going      PT LONG TERM GOAL #2   Title Pt to demo improved strength of Bil LEs,  to at least 4+/5 to improve stability and gait.     Status On-going      PT LONG TERM GOAL #3   Title Pt to demo safe ambulation with LRAD, with consistent ability for proper stride length and step height, for at least 247ft, to improve safety and fall risk.     Status On-going                 Plan - 12/10/19 1234    Clinical Impression Statement no progresses towards goals, Increase time needed for all mobility today. Pt took over 4 minutes to ambulate forty feet. Pt takes small inconsistent steps. increase time needed to initiate any movement with LLE causing his to freeze during gait. Visible quad weakness noted with LAQ today.    Personal Factors and Comorbidities Age;Comorbidity 2;Past/Current Experience    Comorbidities peripheral neuropathy, HTN    Examination-Activity Limitations Bed Mobility;Bend;Lift;Stand;Stairs;Locomotion Level;Transfers    Examination-Participation Restrictions Community Activity;Interpersonal Relationship    Stability/Clinical Decision Making Stable/Uncomplicated    Rehab Potential Good    PT Frequency 2x / week    PT Duration 6 weeks    PT Treatment/Interventions ADLs/Self Care Home  Management;Cryotherapy;Electrical Stimulation;Iontophoresis 4mg /ml Dexamethasone;Moist Heat;Therapeutic activities;Functional mobility training;Stair training;Gait training;DME Instruction;Ultrasound;Therapeutic exercise;Balance training;Neuromuscular re-education;Patient/family education;Dry needling;Passive range of motion;Manual techniques;Wheelchair mobility training;Taping;Vasopneumatic Device    PT Next Visit Plan Stretch, soft tissue work, dynamic balance, gait. Assess goals.           Patient will benefit from skilled therapeutic intervention in order to improve the following deficits and impairments:  Abnormal gait, Decreased endurance, Decreased activity tolerance, Decreased strength, Difficulty walking, Decreased mobility, Decreased balance, Improper body mechanics, Impaired flexibility, Decreased coordination, Decreased safety awareness, Pain  Visit Diagnosis: Other abnormalities of gait and mobility  Chronic bilateral low back pain without sciatica  Muscle weakness (generalized)  Difficulty in walking, not elsewhere classified     Problem List Patient Active Problem List   Diagnosis Date Noted  . Essential hypertension 12/22/2017  . History of prostate cancer 12/22/2017  . Glaucoma 12/22/2017  . Aortic stenosis 12/22/2017  . OA (osteoarthritis) 12/22/2017  . Abnormal LFTs 12/22/2017  . Thrombocytosis ([place]) 12/22/2017  . Normocytic anemia 12/22/2017  . Hyponatremia 12/22/2017  . Cellulitis and abscess of left leg 12/21/2017  . Left ankle swelling   . Acute respiratory failure with hypoxia ([place])   . Paroxysmal supraventricular tachycardia ([place])   . Sepsis ([place]) 12/06/2017  . Acute lower UTI 12/06/2017  . Tachycardia 12/06/2017  . Leukocytosis 12/06/2017    Scot Jun, PTA 12/10/2019, 12:37 PM  [address] [address] [place], [place], [address] Phone: [phone]   Fax:   [phone]  Name: Kayle Correa MRN: [id number] Date of Birth: 08/04/1931

## 2019-12-12 ENCOUNTER — Ambulatory Visit: Payer: Medicare HMO | Admitting: Physical Therapy

## 2019-12-12 ENCOUNTER — Other Ambulatory Visit: Payer: Self-pay

## 2019-12-12 ENCOUNTER — Encounter: Payer: Self-pay | Admitting: Physical Therapy

## 2019-12-12 DIAGNOSIS — R2689 Other abnormalities of gait and mobility: Secondary | ICD-10-CM

## 2019-12-12 DIAGNOSIS — G8929 Other chronic pain: Secondary | ICD-10-CM | POA: Diagnosis not present

## 2019-12-12 DIAGNOSIS — R262 Difficulty in walking, not elsewhere classified: Secondary | ICD-10-CM | POA: Diagnosis not present

## 2019-12-12 DIAGNOSIS — M545 Low back pain, unspecified: Secondary | ICD-10-CM

## 2019-12-12 DIAGNOSIS — M6281 Muscle weakness (generalized): Secondary | ICD-10-CM | POA: Diagnosis not present

## 2019-12-12 NOTE — Therapy (Signed)
[place] [address] [place], [place], [address] Phone: [phone]   Fax:  [phone]  Physical Therapy Treatment  Patient Details  Name: Jeffrey Porter MRN: [id number] Date of Birth: [birth year] Referring Provider (PT): [name]   Encounter Date: 12/12/2019   PT End of Session - 12/12/19 1146    Visit Number 11    Date for PT Re-Evaluation 12/31/17    PT Start Time 1055    PT Stop Time 1142    PT Time Calculation (min) 47 min    Activity Tolerance Patient tolerated treatment well    Behavior During Therapy [facility] for tasks assessed/performed           Past Medical History:  Diagnosis Date  . Baker's cyst of knee, left   . Glaucoma   . Hypertension   . Neuromuscular disorder ([place])    peripheral neuropathy  . Prostate cancer [facility])     Past Surgical History:  Procedure Laterality Date  . BALLOON DILATION N/A 08/03/2019   Procedure: BALLOON DILATION;  Surgeon: Alexis Frock, MD;  [facility];  Service: Urology;  Laterality: N/A;  . CATARACT EXTRACTION    . CYSTOSCOPY WITH RETROGRADE URETHROGRAM N/A 08/03/2019   Procedure: CYSTOSCOPY WITH RETROGRADE URETHROGRAM;  Surgeon: Alexis Frock, MD;  [facility];  Service: Urology;  Laterality: N/A;  . HERNIA REPAIR    . INSERTION BRACHYTHERAPY DEVICE      There were no vitals filed for this visit.   Subjective Assessment - 12/12/19 1104    Subjective Pt reports feeling good this morning    Currently in Pain? Yes    Pain Score 5     Pain Location Back                             [facility] Adult PT Treatment/Exercise - 12/12/19 0001      Lumbar Exercises: Aerobic   Nustep L5 LE/UE x4 min; LE only L3 x 4 min      Lumbar Exercises: Seated   Other Seated Lumbar Exercises seated rows x15 with green TB      Knee/Hip Exercises: Standing   Other Standing Knee Exercises standing hip abd/ext/flexion x10 with 2.5# weight      Knee/Hip  Exercises: Seated   Long Arc Quad Strengthening;Both;2 sets;10 reps    Long Arc Quad Weight 3 lbs.    Marching Both;10 reps;2 sets    Marching Limitations 2lb     Hamstring Curl Strengthening;Both;2 sets;10 reps    Hamstring Limitations green TB                    PT Short Term Goals - 12/05/19 1104      PT SHORT TERM GOAL #1   Title Pt to be independent with initial HEP     Time 2    Period Weeks    Status Achieved    Target Date 11/14/19             PT Long Term Goals - 12/10/19 1236      PT LONG TERM GOAL #1   Title Pt to demonstrate STS with UE support independently x5 to improve ability to transfer safely and perform household ADLs.    Status On-going      PT LONG TERM GOAL #2   Title Pt to demo improved strength of Bil LEs, to at least 4+/5 to improve  stability and gait.     Status On-going      PT LONG TERM GOAL #3   Title Pt to demo safe ambulation with LRAD, with consistent ability for proper stride length and step height, for at least 271ft, to improve safety and fall risk.     Status On-going                 Plan - 12/12/19 1146    Clinical Impression Statement Pt with shuffling steps and freezing gait, difficulty with initiation. Pt takes increased time to transition to each activity. Pt able to tolerate mild LE strengthening. Required cuing for form with seated rows. Anticipate d/c next week.    PT Treatment/Interventions ADLs/Self Care Home Management;Cryotherapy;Electrical Stimulation;Iontophoresis 4mg /ml Dexamethasone;Moist Heat;Therapeutic activities;Functional mobility training;Stair training;Gait training;DME Instruction;Ultrasound;Therapeutic exercise;Balance training;Neuromuscular re-education;Patient/family education;Dry needling;Passive range of motion;Manual techniques;Wheelchair mobility training;Taping;Vasopneumatic Device    PT Next Visit Plan Stretch, soft tissue work, dynamic balance, gait.    Consulted and Agree with Plan of  Care Patient           Patient will benefit from skilled therapeutic intervention in order to improve the following deficits and impairments:  Abnormal gait, Decreased endurance, Decreased activity tolerance, Decreased strength, Difficulty walking, Decreased mobility, Decreased balance, Improper body mechanics, Impaired flexibility, Decreased coordination, Decreased safety awareness, Pain  Visit Diagnosis: Other abnormalities of gait and mobility  Chronic bilateral low back pain without sciatica  Muscle weakness (generalized)  Difficulty in walking, not elsewhere classified     Problem List Patient Active Problem List   Diagnosis Date Noted  . Essential hypertension 12/22/2017  . History of prostate cancer 12/22/2017  . Glaucoma 12/22/2017  . Aortic stenosis 12/22/2017  . OA (osteoarthritis) 12/22/2017  . Abnormal LFTs 12/22/2017  . Thrombocytosis ([place]) 12/22/2017  . Normocytic anemia 12/22/2017  . Hyponatremia 12/22/2017  . Cellulitis and abscess of left leg 12/21/2017  . Left ankle swelling   . Acute respiratory failure with hypoxia ([place])   . Paroxysmal supraventricular tachycardia ([place])   . Sepsis ([place]) 12/06/2017  . Acute lower UTI 12/06/2017  . Tachycardia 12/06/2017  . Leukocytosis 12/06/2017   Amador Cunas, PT, DPT Donald Prose Molly Savarino 12/12/2019, 11:48 AM  [place] [address] [place], [place], [address] Phone: [phone]   Fax:  [phone]  Name: Jeffrey Porter MRN: [id number] Date of Birth: Dec 24, 1931

## 2019-12-17 ENCOUNTER — Ambulatory Visit: Payer: Medicare HMO | Admitting: Physical Therapy

## 2019-12-17 ENCOUNTER — Encounter: Payer: Self-pay | Admitting: Physical Therapy

## 2019-12-17 ENCOUNTER — Other Ambulatory Visit: Payer: Self-pay

## 2019-12-17 DIAGNOSIS — M545 Low back pain, unspecified: Secondary | ICD-10-CM

## 2019-12-17 DIAGNOSIS — R2689 Other abnormalities of gait and mobility: Secondary | ICD-10-CM

## 2019-12-17 DIAGNOSIS — R262 Difficulty in walking, not elsewhere classified: Secondary | ICD-10-CM

## 2019-12-17 DIAGNOSIS — M6281 Muscle weakness (generalized): Secondary | ICD-10-CM

## 2019-12-17 DIAGNOSIS — G8929 Other chronic pain: Secondary | ICD-10-CM

## 2019-12-17 NOTE — Therapy (Signed)
[address] [place], [place], [address] Phone: [phone]   Fax:  [phone]  Physical Therapy Treatment  Patient Details  Name: Jeffrey Porter MRN: [id number] Date of Birth: [birth year] Referring Provider (PT): [name]   Encounter Date: 12/17/2019   PT End of Session - 12/17/19 1149    Visit Number 12    Date for PT Re-Evaluation 12/31/17    Authorization Type Aetna Medicare    PT Start Time 1100    PT Stop Time 1145    PT Time Calculation (min) 45 min    Activity Tolerance Patient tolerated treatment well    Behavior During Therapy [facility] for tasks assessed/performed           Past Medical History:  Diagnosis Date  . Baker's cyst of knee, left   . Glaucoma   . Hypertension   . Neuromuscular disorder ([place])    peripheral neuropathy  . Prostate cancer [facility])     Past Surgical History:  Procedure Laterality Date  . BALLOON DILATION N/A 08/03/2019   Procedure: BALLOON DILATION;  Surgeon: Alexis Frock, MD;  [facility];  Service: Urology;  Laterality: N/A;  . CATARACT EXTRACTION    . CYSTOSCOPY WITH RETROGRADE URETHROGRAM N/A 08/03/2019   Procedure: CYSTOSCOPY WITH RETROGRADE URETHROGRAM;  Surgeon: Alexis Frock, MD;  [facility];  Service: Urology;  Laterality: N/A;  . HERNIA REPAIR    . INSERTION BRACHYTHERAPY DEVICE      There were no vitals filed for this visit.   Subjective Assessment - 12/17/19 1103    Subjective C/O soreness in the buttocks with bending and with pivoting    Currently in Pain? Yes    Pain Score 4     Pain Location Buttocks    Pain Orientation Right;Left    Pain Descriptors / Indicators Sore;Tightness;Aching    Aggravating Factors  bending and pivoting                             [facility] Adult PT Treatment/Exercise - 12/17/19 0001      Ambulation/Gait   Gait Comments worked on [employer] with cues for Big steps, left right and he did great, he  did have difficulty going through doors would go to a festenating gait, we did this 180 feet, about every 20 feet he would need a reset with some cues and mild assisted weight shift      Lumbar Exercises: Stretches   Active Hamstring Stretch Right;Left;3 reps;20 seconds    Active Hamstring Stretch Limitations in sitting with blue tband    Passive Hamstring Stretch Right;Left;5 reps;20 seconds    Single Knee to Chest Stretch Right;Left;5 reps;10 seconds    Lower Trunk Rotation 4 reps;20 seconds    Piriformis Stretch Right;Left;5 reps;20 seconds      Lumbar Exercises: Aerobic   Nustep L5 x 6 minutes      Lumbar Exercises: Seated   Other Seated Lumbar Exercises seated weighted ball side to side with PT over pressure for trunk rotation      Lumbar Exercises: Supine   Bridge 10 reps    Other Supine Lumbar Exercises bosu behind partial sit ups                    PT Short Term Goals - 12/05/19 1104      PT SHORT TERM GOAL #1   Title Pt to be  independent with initial HEP     Time 2    Period Weeks    Status Achieved    Target Date 11/14/19             PT Long Term Goals - 12/10/19 1236      PT LONG TERM GOAL #1   Title Pt to demonstrate STS with UE support independently x5 to improve ability to transfer safely and perform household ADLs.    Status On-going      PT LONG TERM GOAL #2   Title Pt to demo improved strength of Bil LEs, to at least 4+/5 to improve stability and gait.     Status On-going      PT LONG TERM GOAL #3   Title Pt to demo safe ambulation with LRAD, with consistent ability for proper stride length and step height, for at least 270ft, to improve safety and fall risk.     Status On-going                 Plan - 12/17/19 1150    Clinical Impression Statement Today is the first time I have seen Mr Vanwagoner, I have just seen him from afar, I changed some treatment to really work on how stiff his back and hips are, I also added partial sit up and  HHA gait.  With the gait I could get him to walk fast and almost normal wiht the HHA and cues for big, left, right and at times a minor assisted weight shift, About every 25' he would festenate or when going through doorways, but we could rest with verbal cues an dthe light weight shift assist.    PT Next Visit Plan I would like to change the plan up some to what we did today and try to see him longer, I worry about the carry over but he did great with this compared to on his own with the walker and just cues    Consulted and Agree with Plan of Care Patient           Patient will benefit from skilled therapeutic intervention in order to improve the following deficits and impairments:  Abnormal gait, Decreased endurance, Decreased activity tolerance, Decreased strength, Difficulty walking, Decreased mobility, Decreased balance, Improper body mechanics, Impaired flexibility, Decreased coordination, Decreased safety awareness, Pain  Visit Diagnosis: Other abnormalities of gait and mobility  Chronic bilateral low back pain without sciatica  Muscle weakness (generalized)  Difficulty in walking, not elsewhere classified     Problem List Patient Active Problem List   Diagnosis Date Noted  . Essential hypertension 12/22/2017  . History of prostate cancer 12/22/2017  . Glaucoma 12/22/2017  . Aortic stenosis 12/22/2017  . OA (osteoarthritis) 12/22/2017  . Abnormal LFTs 12/22/2017  . Thrombocytosis ([place]) 12/22/2017  . Normocytic anemia 12/22/2017  . Hyponatremia 12/22/2017  . Cellulitis and abscess of left leg 12/21/2017  . Left ankle swelling   . Acute respiratory failure with hypoxia ([place])   . Paroxysmal supraventricular tachycardia ([place])   . Sepsis ([place]) 12/06/2017  . Acute lower UTI 12/06/2017  . Tachycardia 12/06/2017  . Leukocytosis 12/06/2017    Sumner Boast., PT 12/17/2019, 11:53 AM  [address] [place], [place], [address] Phone: [phone]   Fax:  [phone]  Name: Daquan Crapps MRN: [id number] Date of Birth: Jan 10, 1932

## 2019-12-19 ENCOUNTER — Other Ambulatory Visit: Payer: Self-pay

## 2019-12-19 ENCOUNTER — Encounter: Payer: Self-pay | Admitting: Physical Therapy

## 2019-12-19 ENCOUNTER — Ambulatory Visit: Payer: Medicare HMO | Admitting: Physical Therapy

## 2019-12-19 DIAGNOSIS — G8929 Other chronic pain: Secondary | ICD-10-CM | POA: Diagnosis not present

## 2019-12-19 DIAGNOSIS — R2689 Other abnormalities of gait and mobility: Secondary | ICD-10-CM | POA: Diagnosis not present

## 2019-12-19 DIAGNOSIS — M545 Low back pain, unspecified: Secondary | ICD-10-CM

## 2019-12-19 DIAGNOSIS — M6281 Muscle weakness (generalized): Secondary | ICD-10-CM | POA: Diagnosis not present

## 2019-12-19 DIAGNOSIS — R262 Difficulty in walking, not elsewhere classified: Secondary | ICD-10-CM

## 2019-12-19 NOTE — Therapy (Signed)
[address] [place], [place], [address] Phone: [phone]   Fax:  [phone]  Physical Therapy Treatment  Patient Details  Name: Jeffrey Porter MRN: [id number] Date of Birth: 1932/02/07 Referring Provider (PT): [name]   Encounter Date: 12/19/2019   PT End of Session - 12/19/19 1148    Visit Number 13    Date for PT Re-Evaluation 01/01/20    Authorization Type Aetna Medicare    PT Start Time 1100    PT Stop Time 1146    PT Time Calculation (min) 46 min    Activity Tolerance Patient tolerated treatment well    Behavior During Therapy [facility] for tasks assessed/performed           Past Medical History:  Diagnosis Date   Baker's cyst of knee, left    Glaucoma    Hypertension    Neuromuscular disorder ([place])    peripheral neuropathy   Prostate cancer [facility])     Past Surgical History:  Procedure Laterality Date   BALLOON DILATION N/A 08/03/2019   Procedure: BALLOON DILATION;  Surgeon: Alexis Frock, MD;  [facility];  Service: Urology;  Laterality: N/A;   CATARACT EXTRACTION     CYSTOSCOPY WITH RETROGRADE URETHROGRAM N/A 08/03/2019   Procedure: CYSTOSCOPY WITH RETROGRADE URETHROGRAM;  Surgeon: Alexis Frock, MD;  [facility];  Service: Urology;  Laterality: N/A;   HERNIA REPAIR     INSERTION BRACHYTHERAPY DEVICE      There were no vitals filed for this visit.   Subjective Assessment - 12/19/19 1106    Subjective Pt states feeling good today    Currently in Pain? Yes    Pain Score 2     Pain Location Buttocks    Pain Orientation Right;Left                             [facility] Adult PT Treatment/Exercise - 12/19/19 0001      Ambulation/Gait   Gait Comments HHA with cues for big steps in open spaces in gym and through doorways and around obstacles; festinating gait and freezing with tighter spaces. Did well walking in straight line with open space       Lumbar Exercises: Stretches   Active Hamstring Stretch Right;Left;2 reps;20 seconds    Active Hamstring Stretch Limitations in sitting with blue tband    Passive Hamstring Stretch Right;Left;5 reps;20 seconds    Single Knee to Chest Stretch Right;Left;5 reps;10 seconds    Lower Trunk Rotation 4 reps;20 seconds    Piriformis Stretch Right;Left;5 reps;20 seconds    Other Lumbar Stretch Exercise hip adductor stretch; gastroc strech 20 sec x4      Lumbar Exercises: Aerobic   Nustep L5 x 6 min                    PT Short Term Goals - 12/05/19 1104      PT SHORT TERM GOAL #1   Title Pt to be independent with initial HEP     Time 2    Period Weeks    Status Achieved    Target Date 11/14/19             PT Long Term Goals - 12/10/19 1236      PT LONG TERM GOAL #1   Title Pt to demonstrate STS with UE support independently x5 to improve ability to transfer safely and perform  household ADLs.    Status On-going      PT LONG TERM GOAL #2   Title Pt to demo improved strength of Bil LEs, to at least 4+/5 to improve stability and gait.     Status On-going      PT LONG TERM GOAL #3   Title Pt to demo safe ambulation with LRAD, with consistent ability for proper stride length and step height, for at least 246ft, to improve safety and fall risk.     Status On-going                 Plan - 12/19/19 1149    Clinical Impression Statement Pt expressed that stretching was very helpful last rx and reports decreased pain in buttocks with stretching. Focused heavily on passive stretching to address this. Gait with HHA and cues for big steps; occasional assist with weight shifts. Did best in open spaces walking in straight line; most difficulty navigating obstacles and tight spaces with freezing. Pt would like to try working on gait/stretching for 2 more weeks to see if we can make improvements.    PT Treatment/Interventions ADLs/Self Care Home Management;Cryotherapy;Electrical  Stimulation;Iontophoresis 4mg /ml Dexamethasone;Moist Heat;Therapeutic activities;Functional mobility training;Stair training;Gait training;DME Instruction;Ultrasound;Therapeutic exercise;Balance training;Neuromuscular re-education;Patient/family education;Dry needling;Passive range of motion;Manual techniques;Wheelchair mobility training;Taping;Vasopneumatic Device    PT Next Visit Plan gait training and hip flexibility; assess carryover from session to session    Consulted and Agree with Plan of Care Patient           Patient will benefit from skilled therapeutic intervention in order to improve the following deficits and impairments:  Abnormal gait, Decreased endurance, Decreased activity tolerance, Decreased strength, Difficulty walking, Decreased mobility, Decreased balance, Improper body mechanics, Impaired flexibility, Decreased coordination, Decreased safety awareness, Pain  Visit Diagnosis: Other abnormalities of gait and mobility  Chronic bilateral low back pain without sciatica  Muscle weakness (generalized)  Difficulty in walking, not elsewhere classified     Problem List Patient Active Problem List   Diagnosis Date Noted   Essential hypertension 12/22/2017   History of prostate cancer 12/22/2017   Glaucoma 12/22/2017   Aortic stenosis 12/22/2017   OA (osteoarthritis) 12/22/2017   Abnormal LFTs 12/22/2017   Thrombocytosis ([place]) 12/22/2017   Normocytic anemia 12/22/2017   Hyponatremia 12/22/2017   Cellulitis and abscess of left leg 12/21/2017   Left ankle swelling    Acute respiratory failure with hypoxia ([facility])    Paroxysmal supraventricular tachycardia ([place])    Sepsis ([place]) 12/06/2017   Acute lower UTI 12/06/2017   Tachycardia 12/06/2017   Leukocytosis 12/06/2017   Amador Cunas, PT, DPT Donald Prose Eva Griffo 12/19/2019, 11:53 AM  [address] [place], [place], [address] Phone:  [phone]   Fax:  [phone]  Name: Jeffrey Porter MRN: [id number] Date of Birth: 1932/03/16

## 2019-12-26 ENCOUNTER — Ambulatory Visit: Payer: Medicare HMO | Attending: Orthopedic Surgery | Admitting: Physical Therapy

## 2019-12-26 ENCOUNTER — Other Ambulatory Visit: Payer: Self-pay

## 2019-12-26 ENCOUNTER — Encounter: Payer: Self-pay | Admitting: Physical Therapy

## 2019-12-26 DIAGNOSIS — R262 Difficulty in walking, not elsewhere classified: Secondary | ICD-10-CM

## 2019-12-26 DIAGNOSIS — M6281 Muscle weakness (generalized): Secondary | ICD-10-CM | POA: Diagnosis not present

## 2019-12-26 DIAGNOSIS — M545 Low back pain, unspecified: Secondary | ICD-10-CM

## 2019-12-26 DIAGNOSIS — G8929 Other chronic pain: Secondary | ICD-10-CM | POA: Diagnosis not present

## 2019-12-26 DIAGNOSIS — R2689 Other abnormalities of gait and mobility: Secondary | ICD-10-CM | POA: Diagnosis not present

## 2019-12-26 NOTE — Therapy (Signed)
[address] [place], [place], [address] Phone: [phone]   Fax:  [phone]  Physical Therapy Treatment  Patient Details  Name: Jeffrey Porter MRN: [id number] Date of Birth: 1931/10/03 Referring Provider (PT): [name]   Encounter Date: 12/26/2019   PT End of Session - 12/26/19 1147    Visit Number 14    Date for PT Re-Evaluation 01/01/20    Authorization Type Aetna Medicare    PT Start Time 3903    PT Stop Time 1134    PT Time Calculation (min) 47 min    Activity Tolerance Patient tolerated treatment well    Behavior During Therapy [facility] for tasks assessed/performed           Past Medical History:  Diagnosis Date  . Baker's cyst of knee, left   . Glaucoma   . Hypertension   . Neuromuscular disorder ([place])    peripheral neuropathy  . Prostate cancer [facility])     Past Surgical History:  Procedure Laterality Date  . BALLOON DILATION N/A 08/03/2019   Procedure: BALLOON DILATION;  Surgeon: Alexis Frock, MD;  [facility];  Service: Urology;  Laterality: N/A;  . CATARACT EXTRACTION    . CYSTOSCOPY WITH RETROGRADE URETHROGRAM N/A 08/03/2019   Procedure: CYSTOSCOPY WITH RETROGRADE URETHROGRAM;  Surgeon: Alexis Frock, MD;  [facility];  Service: Urology;  Laterality: N/A;  . HERNIA REPAIR    . INSERTION BRACHYTHERAPY DEVICE      There were no vitals filed for this visit.   Subjective Assessment - 12/26/19 1054    Subjective Reports "bad day, really slow today", "maybe rain"    Currently in Pain? Yes    Pain Score 3     Pain Location Knee    Pain Orientation Left    Aggravating Factors  weather                             [facility] Adult PT Treatment/Exercise - 12/26/19 0001      Ambulation/Gait   Gait Comments HHA, difficult to get started. but once I get him going he will go occasionally needing cues for big step and some manual weight shift, with distractions he can  get stuck in the festenating gait      High Level Balance   High Level Balance Comments standing ball toss      Lumbar Exercises: Stretches   Passive Hamstring Stretch Right;Left;5 reps;20 seconds    Single Knee to Chest Stretch Right;Left;5 reps;10 seconds    Lower Trunk Rotation 4 reps;20 seconds    Piriformis Stretch Right;Left;5 reps;20 seconds    Other Lumbar Stretch Exercise hip adductor stretch; gastroc strech 20 sec x4      Lumbar Exercises: Aerobic   Nustep L5 x 6 min      Lumbar Exercises: Supine   Bridge 15 reps    Other Supine Lumbar Exercises feet on ball K2C, obliques,bridges and abdominals                     PT Short Term Goals - 12/05/19 1104      PT SHORT TERM GOAL #1   Title Pt to be independent with initial HEP     Time 2    Period Weeks    Status Achieved    Target Date 11/14/19             PT  Long Term Goals - 12/26/19 1250      PT LONG TERM GOAL #1   Title Pt to demonstrate STS with UE support independently x5 to improve ability to transfer safely and perform household ADLs.    Status On-going      PT LONG TERM GOAL #2   Title Pt to demo improved strength of Bil LEs, to at least 4+/5 to improve stability and gait.     Status Partially Met                 Plan - 12/26/19 1245    Clinical Impression Statement Patient is moving very slow today reports weather.  He does well with HHA and cues to go slow and take big steps, he does at times needs some weight shift    PT Next Visit Plan gait training and hip flexibility; assess carryover from session to session    Consulted and Agree with Plan of Care Patient           Patient will benefit from skilled therapeutic intervention in order to improve the following deficits and impairments:  Abnormal gait, Decreased endurance, Decreased activity tolerance, Decreased strength, Difficulty walking, Decreased mobility, Decreased balance, Improper body mechanics, Impaired flexibility,  Decreased coordination, Decreased safety awareness, Pain  Visit Diagnosis: Other abnormalities of gait and mobility  Chronic bilateral low back pain without sciatica  Muscle weakness (generalized)  Difficulty in walking, not elsewhere classified     Problem List Patient Active Problem List   Diagnosis Date Noted  . Essential hypertension 12/22/2017  . History of prostate cancer 12/22/2017  . Glaucoma 12/22/2017  . Aortic stenosis 12/22/2017  . OA (osteoarthritis) 12/22/2017  . Abnormal LFTs 12/22/2017  . Thrombocytosis ([place]) 12/22/2017  . Normocytic anemia 12/22/2017  . Hyponatremia 12/22/2017  . Cellulitis and abscess of left leg 12/21/2017  . Left ankle swelling   . Acute respiratory failure with hypoxia ([place])   . Paroxysmal supraventricular tachycardia ([place])   . Sepsis ([place]) 12/06/2017  . Acute lower UTI 12/06/2017  . Tachycardia 12/06/2017  . Leukocytosis 12/06/2017    Sumner Boast., PT 12/26/2019, 12:51 PM  [address] [place], [place], [address] Phone: [phone]   Fax:  [phone]  Name: Jeffrey Porter MRN: [id number] Date of Birth: 1931/08/31

## 2020-01-02 ENCOUNTER — Ambulatory Visit: Payer: Medicare HMO | Admitting: Physical Therapy

## 2020-01-02 ENCOUNTER — Encounter: Payer: Self-pay | Admitting: Physical Therapy

## 2020-01-02 ENCOUNTER — Other Ambulatory Visit: Payer: Self-pay

## 2020-01-02 DIAGNOSIS — G8929 Other chronic pain: Secondary | ICD-10-CM

## 2020-01-02 DIAGNOSIS — R262 Difficulty in walking, not elsewhere classified: Secondary | ICD-10-CM | POA: Diagnosis not present

## 2020-01-02 DIAGNOSIS — R2689 Other abnormalities of gait and mobility: Secondary | ICD-10-CM | POA: Diagnosis not present

## 2020-01-02 DIAGNOSIS — M545 Low back pain, unspecified: Secondary | ICD-10-CM

## 2020-01-02 DIAGNOSIS — M6281 Muscle weakness (generalized): Secondary | ICD-10-CM | POA: Diagnosis not present

## 2020-01-02 NOTE — Therapy (Signed)
[address] [place], [place], [address] Phone: [phone]   Fax:  [phone]  Physical Therapy Treatment  Patient Details  Name: Jeffrey Porter MRN: [id number] Date of Birth: 19-Apr-1932 Referring Provider (PT): [name]   Encounter Date: 01/02/2020   PT End of Session - 01/02/20 1149    Visit Number 15    Date for PT Re-Evaluation 02/01/20    Authorization Type Aetna Medicare    PT Start Time 1050    PT Stop Time 1140    PT Time Calculation (min) 50 min    Activity Tolerance Patient tolerated treatment well    Behavior During Therapy [facility] for tasks assessed/performed           Past Medical History:  Diagnosis Date  . Baker's cyst of knee, left   . Glaucoma   . Hypertension   . Neuromuscular disorder ([place])    peripheral neuropathy  . Prostate cancer [facility])     Past Surgical History:  Procedure Laterality Date  . BALLOON DILATION N/A 08/03/2019   Procedure: BALLOON DILATION;  Surgeon: Alexis Frock, MD;  [facility];  Service: Urology;  Laterality: N/A;  . CATARACT EXTRACTION    . CYSTOSCOPY WITH RETROGRADE URETHROGRAM N/A 08/03/2019   Procedure: CYSTOSCOPY WITH RETROGRADE URETHROGRAM;  Surgeon: Alexis Frock, MD;  [facility];  Service: Urology;  Laterality: N/A;  . HERNIA REPAIR    . INSERTION BRACHYTHERAPY DEVICE      There were no vitals filed for this visit.   Subjective Assessment - 01/02/20 1051    Subjective Patient and caregiver report that they tried some walking with HHA, they report that he did well, much faster    Currently in Pain? No/denies                             [facility] Adult PT Treatment/Exercise - 01/02/20 0001      Ambulation/Gait   Gait Comments stairs step over step, a lot of cues      High Level Balance   High Level Balance Comments standing ball toss different angles, standing on airex and then tried some small head tunrs       Lumbar Exercises: Aerobic   Recumbent Bike 3 minutes level 0    Nustep L6 x 6 min, cues for longer smoot strides he will tend to do choppy small strokes      Lumbar Exercises: Seated   Other Seated Lumbar Exercises bosu behind partial sit ups    Other Seated Lumbar Exercises seated weighted ball side to side with PT over pressure for trunk rotation                    PT Short Term Goals - 12/05/19 1104      PT SHORT TERM GOAL #1   Title Pt to be independent with initial HEP     Time 2    Period Weeks    Status Achieved    Target Date 11/14/19             PT Long Term Goals - 01/02/20 1155      PT LONG TERM GOAL #1   Title Pt to demonstrate STS with UE support independently x5 to improve ability to transfer safely and perform household ADLs.    Status Partially Met      PT LONG TERM GOAL #2  Title Pt to demo improved strength of Bil LEs, to at least 4+/5 to improve stability and gait.     Status Partially Met      PT LONG TERM GOAL #3   Title Pt to demo safe ambulation with LRAD, with consistent ability for proper stride length and step height, for at least 250f, to improve safety and fall risk.     Status Partially Met                 Plan - 01/02/20 1149    Clinical Impression Statement Wetried some partial steps almost a lunge and tried to get him used to the movement of stepping, he did very well with this, the biggest issue is initiating the steps.  Over the past few weeks we altered our treatment to really focus on the walking and trying to get him moving at a more functional pace.  We can do this with HHA, some manual weight shift and verbal cues.  He has difficulty through doorways, and changing floor surfaces as well as the initiation of gait.  I feel that I have seen enough of a positive response to warrant further treatment, I have told him that we need to see carryover to continue past this next period.  His caregive does report trying some of  the things we are doing with the gait to help with speeding him up for increased function    PT Next Visit Plan gait training and hip flexibility; assess carryover from session to session    Consulted and Agree with Plan of Care Patient           Patient will benefit from skilled therapeutic intervention in order to improve the following deficits and impairments:  Abnormal gait, Decreased endurance, Decreased activity tolerance, Decreased strength, Difficulty walking, Decreased mobility, Decreased balance, Improper body mechanics, Impaired flexibility, Decreased coordination, Decreased safety awareness, Pain  Visit Diagnosis: Other abnormalities of gait and mobility  Chronic bilateral low back pain without sciatica  Muscle weakness (generalized)  Difficulty in walking, not elsewhere classified     Problem List Patient Active Problem List   Diagnosis Date Noted  . Essential hypertension 12/22/2017  . History of prostate cancer 12/22/2017  . Glaucoma 12/22/2017  . Aortic stenosis 12/22/2017  . OA (osteoarthritis) 12/22/2017  . Abnormal LFTs 12/22/2017  . Thrombocytosis ([place] 12/22/2017  . Normocytic anemia 12/22/2017  . Hyponatremia 12/22/2017  . Cellulitis and abscess of left leg 12/21/2017  . Left ankle swelling   . [place] respiratory failure with hypoxia ([place]   . Parox[place]ventricular tachycardia ([place]   . [place]is ([place] 12/06/2017  . Acute lower UTI 12/06/2017  . Tachycardia 12/06/2017  . Leukocytosis 12/06/2017    ASumner Boast, PT 01/02/2020, 11:56 AM  [address] [address]Phone: [phone]  Fax:  [phone] Name: JKristion HolifieldMRN: [id number] of Birth: 10/16/1931/02/24

## 2020-01-07 DIAGNOSIS — H401134 Primary open-angle glaucoma, bilateral, indeterminate stage: Secondary | ICD-10-CM | POA: Diagnosis not present

## 2020-01-09 ENCOUNTER — Other Ambulatory Visit: Payer: Self-pay

## 2020-01-09 ENCOUNTER — Ambulatory Visit: Payer: Medicare HMO | Admitting: Physical Therapy

## 2020-01-09 ENCOUNTER — Encounter: Payer: Self-pay | Admitting: Physical Therapy

## 2020-01-09 DIAGNOSIS — R262 Difficulty in walking, not elsewhere classified: Secondary | ICD-10-CM

## 2020-01-09 DIAGNOSIS — M6281 Muscle weakness (generalized): Secondary | ICD-10-CM | POA: Diagnosis not present

## 2020-01-09 DIAGNOSIS — M545 Low back pain, unspecified: Secondary | ICD-10-CM

## 2020-01-09 DIAGNOSIS — G8929 Other chronic pain: Secondary | ICD-10-CM | POA: Diagnosis not present

## 2020-01-09 DIAGNOSIS — R2689 Other abnormalities of gait and mobility: Secondary | ICD-10-CM

## 2020-01-09 NOTE — Therapy (Signed)
[address] [place], [place], [address] Phone: [phone]   Fax:  [phone]  Physical Therapy Treatment  Patient Details  Name: Jeffrey Porter MRN: [id number] Date of Birth: Mar 16, 1932 Referring Provider (PT): [name]   Encounter Date: 01/09/2020   PT End of Session - 01/09/20 1228    Visit Number 16    Date for PT Re-Evaluation 02/01/20    Authorization Type Aetna Medicare    PT Start Time 6237    PT Stop Time 1228    PT Time Calculation (min) 46 min    Activity Tolerance Patient tolerated treatment well    Behavior During Therapy [facility] for tasks assessed/performed           Past Medical History:  Diagnosis Date  . Baker's cyst of knee, left   . Glaucoma   . Hypertension   . Neuromuscular disorder ([place])    peripheral neuropathy  . Prostate cancer [facility])     Past Surgical History:  Procedure Laterality Date  . BALLOON DILATION N/A 08/03/2019   Procedure: BALLOON DILATION;  Surgeon: Alexis Frock, MD;  [facility];  Service: Urology;  Laterality: N/A;  . CATARACT EXTRACTION    . CYSTOSCOPY WITH RETROGRADE URETHROGRAM N/A 08/03/2019   Procedure: CYSTOSCOPY WITH RETROGRADE URETHROGRAM;  Surgeon: Alexis Frock, MD;  [facility];  Service: Urology;  Laterality: N/A;  . HERNIA REPAIR    . INSERTION BRACHYTHERAPY DEVICE      There were no vitals filed for this visit.   Subjective Assessment - 01/09/20 1147    Subjective "Kinda bad, last few days been sore and stiff"    Currently in Pain? Yes    Pain Score 4     Pain Location Back                             [facility] Adult PT Treatment/Exercise - 01/09/20 0001      High Level Balance   High Level Balance Comments standing ball toss different angles, standing on airex and then tried some small head tunrs      Lumbar Exercises: Aerobic   Recumbent Bike 4 minutes level 0    Nustep L6 x 6 min, cues for longer smoot  strides he will tend to do choppy small strokes      Lumbar Exercises: Seated   Other Seated Lumbar Exercises bosu behind partial sit ups, Rows black 2x15    Other Seated Lumbar Exercises weightds ball diagonal bilat reaches      Knee/Hip Exercises: Stretches   Active Hamstring Stretch Both;10 seconds;4 reps      Knee/Hip Exercises: Seated   Marching Both;2 sets;20 reps    Sit to Sand 1 set;5 reps;without UE support   LE compensation needed                    PT Short Term Goals - 12/05/19 1104      PT SHORT TERM GOAL #1   Title Pt to be independent with initial HEP     Time 2    Period Weeks    Status Achieved    Target Date 11/14/19             PT Long Term Goals - 01/02/20 1155      PT LONG TERM GOAL #1   Title Pt to demonstrate STS with UE support independently x5 to improve  ability to transfer safely and perform household ADLs.    Status Partially Met      PT LONG TERM GOAL #2   Title Pt to demo improved strength of Bil LEs, to at least 4+/5 to improve stability and gait.     Status Partially Met      PT LONG TERM GOAL #3   Title Pt to demo safe ambulation with LRAD, with consistent ability for proper stride length and step height, for at least 233f, to improve safety and fall risk.     Status Partially Met                 Plan - 01/09/20 1228    Clinical Impression Statement Pt reports a rough past few days. He had difficulty initiating movement with all mobility requiring increase time throughout session. He  is very stiff and  with limited with all trunk mobility.. Increase fatigue noted with diagonal cross body reaches. He has heavy posterior lean when standing and can correct some with cues.    Personal Factors and Comorbidities Age;Comorbidity 2;Past/Current Experience    Comorbidities peripheral neuropathy, HTN    Examination-Activity Limitations Bed Mobility;Bend;Lift;Stand;Stairs;Locomotion Level;Transfers    Examination-Participation  Restrictions Community Activity;Interpersonal Relationship    Stability/Clinical Decision Making Stable/Uncomplicated    Rehab Potential Good    PT Frequency 2x / week    PT Treatment/Interventions ADLs/Self Care Home Management;Cryotherapy;Electrical Stimulation;Iontophoresis 472mml Dexamethasone;Moist Heat;Therapeutic activities;Functional mobility training;Stair training;Gait training;DME Instruction;Ultrasound;Therapeutic exercise;Balance training;Neuromuscular re-education;Patient/family education;Dry needling;Passive range of motion;Manual techniques;Wheelchair mobility training;Taping;Vasopneumatic Device    PT Next Visit Plan gait training and hip flexibility; assess carryover from session to session           Patient will benefit from skilled therapeutic intervention in order to improve the following deficits and impairments:  Abnormal gait, Decreased endurance, Decreased activity tolerance, Decreased strength, Difficulty walking, Decreased mobility, Decreased balance, Improper body mechanics, Impaired flexibility, Decreased coordination, Decreased safety awareness, Pain  Visit Diagnosis: Chronic bilateral low back pain without sciatica  Muscle weakness (generalized)  Other abnormalities of gait and mobility  Difficulty in walking, not elsewhere classified     Problem List Patient Active Problem List   Diagnosis Date Noted  . Essential hypertension 12/22/2017  . History of prostate cancer 12/22/2017  . Glaucoma 12/22/2017  . Aortic stenosis 12/22/2017  . OA (osteoarthritis) 12/22/2017  . Abnormal LFTs 12/22/2017  . Thrombocytosis (HCSan [place]08/29/2019  . Normocytic anemia 12/22/2017  . Hyponatremia 12/22/2017  . Cellulitis and abscess of left leg 12/21/2017  . Left ankle swelling   . Acute respiratory failure with hypoxia (HCHatties[place]  . Paroxysmal supraventricular tachycardia (HCRathd[place]  . Sepsis (HCLinn [place]08/13/2019  . Acute lower UTI 12/06/2017  . Tachycardia 12/06/2017  .  Leukocytosis 12/06/2017    RoScot JunPTA 01/09/2020, 12:31 PM  [address] [place]hone: [phone] Fax:  [phone]: JuRudolfo BrandowRN: [id number] of Birth: 11/1931-12-20

## 2020-01-14 ENCOUNTER — Ambulatory Visit: Payer: Medicare HMO | Admitting: Physical Therapy

## 2020-01-14 ENCOUNTER — Encounter: Payer: Self-pay | Admitting: Physical Therapy

## 2020-01-14 ENCOUNTER — Other Ambulatory Visit: Payer: Self-pay

## 2020-01-14 DIAGNOSIS — M545 Low back pain, unspecified: Secondary | ICD-10-CM

## 2020-01-14 DIAGNOSIS — M6281 Muscle weakness (generalized): Secondary | ICD-10-CM

## 2020-01-14 DIAGNOSIS — R262 Difficulty in walking, not elsewhere classified: Secondary | ICD-10-CM | POA: Diagnosis not present

## 2020-01-14 DIAGNOSIS — R2689 Other abnormalities of gait and mobility: Secondary | ICD-10-CM | POA: Diagnosis not present

## 2020-01-14 DIAGNOSIS — G8929 Other chronic pain: Secondary | ICD-10-CM

## 2020-01-14 NOTE — Therapy (Signed)
[address] [place], [place], [address] Phone: [phone]   Fax:  [phone]  Physical Therapy Treatment  Patient Details  Name: Xavier Munger MRN: [id number] Date of Birth: 02-Dec-1931 Referring Provider (PT): [name]   Encounter Date: 01/14/2020   PT End of Session - 01/14/20 1233    Visit Number 17    Date for PT Re-Evaluation 02/01/20    Authorization Type Aetna Medicare    PT Start Time 1145    PT Stop Time 1230    PT Time Calculation (min) 45 min    Activity Tolerance Patient tolerated treatment well    Behavior During Therapy [facility] for tasks assessed/performed           Past Medical History:  Diagnosis Date  . Baker's cyst of knee, left   . Glaucoma   . Hypertension   . Neuromuscular disorder ([place])    peripheral neuropathy  . Prostate cancer [facility])     Past Surgical History:  Procedure Laterality Date  . BALLOON DILATION N/A 08/03/2019   Procedure: BALLOON DILATION;  Surgeon: Alexis Frock, MD;  [facility];  Service: Urology;  Laterality: N/A;  . CATARACT EXTRACTION    . CYSTOSCOPY WITH RETROGRADE URETHROGRAM N/A 08/03/2019   Procedure: CYSTOSCOPY WITH RETROGRADE URETHROGRAM;  Surgeon: Alexis Frock, MD;  [facility];  Service: Urology;  Laterality: N/A;  . HERNIA REPAIR    . INSERTION BRACHYTHERAPY DEVICE      There were no vitals filed for this visit.   Subjective Assessment - 01/14/20 1154    Subjective "I am really stiff today"    Currently in Pain? Yes    Pain Score 3     Pain Location Knee    Pain Orientation Left;Right                             [facility] Adult PT Treatment/Exercise - 01/14/20 0001      High Level Balance   High Level Balance Comments standing ball toss different angles      Lumbar Exercises: Stretches   Active Hamstring Stretch 3 reps;20 seconds;Right;Left;10 seconds      Lumbar Exercises: Aerobic   Recumbent Bike 3 minutes  level 0    Nustep L5 x 6 min, cues for longer smoot strides he will tend to do choppy small strokes      Lumbar Exercises: Seated   Other Seated Lumbar Exercises bosu behind partial sit ups, Rows black 2x15    Other Seated Lumbar Exercises weightds ball trunk rotations                     PT Short Term Goals - 12/05/19 1104      PT SHORT TERM GOAL #1   Title Pt to be independent with initial HEP     Time 2    Period Weeks    Status Achieved    Target Date 11/14/19             PT Long Term Goals - 01/02/20 1155      PT LONG TERM GOAL #1   Title Pt to demonstrate STS with UE support independently x5 to improve ability to transfer safely and perform household ADLs.    Status Partially Met      PT LONG TERM GOAL #2   Title Pt to demo improved strength of Bil LEs, to at least 4+/5  to improve stability and gait.     Status Partially Met      PT LONG TERM GOAL #3   Title Pt to demo safe ambulation with LRAD, with consistent ability for proper stride length and step height, for at least 260f, to improve safety and fall risk.     Status Partially Met                 Plan - 01/14/20 1233    Clinical Impression Statement Limited interventions due to pt needing increase time for mobility.Pt too ~ 3 minutes to performed stand pivot transfer. Pt tends to freeze when standing not being able to use his LE despite cues. Limited trunk motion noted with ball toss. Trunk rotations did cause increase fatigue.    Personal Factors and Comorbidities Age;Comorbidity 2;Past/Current Experience    Comorbidities peripheral neuropathy, HTN    Examination-Activity Limitations Bed Mobility;Bend;Lift;Stand;Stairs;Locomotion Level;Transfers    Examination-Participation Restrictions Community Activity;Interpersonal Relationship    Stability/Clinical Decision Making Stable/Uncomplicated    Rehab Potential Good    PT Frequency 2x / week    PT Duration 6 weeks    PT  Treatment/Interventions ADLs/Self Care Home Management;Cryotherapy;Electrical Stimulation;Iontophoresis 454mml Dexamethasone;Moist Heat;Therapeutic activities;Functional mobility training;Stair training;Gait training;DME Instruction;Ultrasound;Therapeutic exercise;Balance training;Neuromuscular re-education;Patient/family education;Dry needling;Passive range of motion;Manual techniques;Wheelchair mobility training;Taping;Vasopneumatic Device    PT Next Visit Plan gait training and hip flexibility; assess carryover from session to session           Patient will benefit from skilled therapeutic intervention in order to improve the following deficits and impairments:  Abnormal gait, Decreased endurance, Decreased activity tolerance, Decreased strength, Difficulty walking, Decreased mobility, Decreased balance, Improper body mechanics, Impaired flexibility, Decreased coordination, Decreased safety awareness, Pain  Visit Diagnosis: Other abnormalities of gait and mobility  Difficulty in walking, not elsewhere classified  Muscle weakness (generalized)  Chronic bilateral low back pain without sciatica     Problem List Patient Active Problem List   Diagnosis Date Noted  . Essential hypertension 12/22/2017  . History of prostate cancer 12/22/2017  . Glaucoma 12/22/2017  . Aortic stenosis 12/22/2017  . OA (osteoarthritis) 12/22/2017  . Abnormal LFTs 12/22/2017  . Thrombocytosis (HCLacled[place]08/29/2019  . Normocytic anemia 12/22/2017  . Hyponatremia 12/22/2017  . Cellulitis and abscess of left leg 12/21/2017  . Left ankle swelling   . Acute respiratory failure with hypoxia (HCPlessis  . Paroxysmal supraventricular tachycardia (HCLewist[place]  . Sepsis (HCKing[place]08/13/2019  . Acute lower UTI 12/06/2017  . Tachycardia 12/06/2017  . Leukocytosis 12/06/2017    RoScot JunPTA 01/14/2020, 12:36 PM  [place] [id number]: [phone] Fax:  [phone]: JuDorothy LandgrebeRN: [id number] of Birth: 11/1931-11-07

## 2020-01-16 ENCOUNTER — Encounter: Payer: Medicare HMO | Admitting: Physical Therapy

## 2020-01-21 ENCOUNTER — Encounter: Payer: Medicare HMO | Admitting: Physical Therapy

## 2020-01-23 ENCOUNTER — Encounter: Payer: Self-pay | Admitting: Physical Therapy

## 2020-01-23 ENCOUNTER — Other Ambulatory Visit: Payer: Self-pay

## 2020-01-23 ENCOUNTER — Ambulatory Visit: Payer: Medicare HMO | Admitting: Physical Therapy

## 2020-01-23 DIAGNOSIS — R262 Difficulty in walking, not elsewhere classified: Secondary | ICD-10-CM | POA: Diagnosis not present

## 2020-01-23 DIAGNOSIS — M545 Low back pain, unspecified: Secondary | ICD-10-CM

## 2020-01-23 DIAGNOSIS — M6281 Muscle weakness (generalized): Secondary | ICD-10-CM

## 2020-01-23 DIAGNOSIS — R2689 Other abnormalities of gait and mobility: Secondary | ICD-10-CM | POA: Diagnosis not present

## 2020-01-23 DIAGNOSIS — G8929 Other chronic pain: Secondary | ICD-10-CM

## 2020-01-23 NOTE — Therapy (Signed)
[address] [place], [place], [address] Phone: [phone]   Fax:  [phone]  Physical Therapy Treatment  Patient Details  Name: Jeffrey Porter MRN: [id number] Date of Birth: 1931/10/07 Referring Provider (PT): [name]   Encounter Date: 01/23/2020   PT End of Session - 01/23/20 1148    Visit Number 18    Date for PT Re-Evaluation 02/01/20    PT Start Time 1100    PT Stop Time 1145    PT Time Calculation (min) 45 min    Activity Tolerance Patient tolerated treatment well    Behavior During Therapy [facility] for tasks assessed/performed           Past Medical History:  Diagnosis Date  . Baker's cyst of knee, left   . Glaucoma   . Hypertension   . Neuromuscular disorder ([place])    peripheral neuropathy  . Prostate cancer [facility])     Past Surgical History:  Procedure Laterality Date  . BALLOON DILATION N/A 08/03/2019   Procedure: BALLOON DILATION;  Surgeon: Alexis Frock, MD;  [facility];  Service: Urology;  Laterality: N/A;  . CATARACT EXTRACTION    . CYSTOSCOPY WITH RETROGRADE URETHROGRAM N/A 08/03/2019   Procedure: CYSTOSCOPY WITH RETROGRADE URETHROGRAM;  Surgeon: Alexis Frock, MD;  [facility];  Service: Urology;  Laterality: N/A;  . HERNIA REPAIR    . INSERTION BRACHYTHERAPY DEVICE      There were no vitals filed for this visit.   Subjective Assessment - 01/23/20 1057    Subjective "Ok, took a tylenol before I left"    Currently in Pain? No/denies                             [facility] Adult PT Treatment/Exercise - 01/23/20 0001      Ambulation/Gait   Gait Comments HHA, difficult to get started. but once I get him going he will go occasionally needing cues for big step and some manual weight shift, with distractions he can get stuck in the festenating gait      High Level Balance   High Level Balance Comments Standing on aires 10 seconds x 3 wont head turns once balance  establish      Lumbar Exercises: Aerobic   Recumbent Bike 3 minutes level 0    Nustep L5 x 6 min, cues for longer smoot strides he will tend to do choppy small strokes      Knee/Hip Exercises: Standing   Other Standing Knee Exercises Standing marches 1lb 2x10                     PT Short Term Goals - 12/05/19 1104      PT SHORT TERM GOAL #1   Title Pt to be independent with initial HEP     Time 2    Period Weeks    Status Achieved    Target Date 11/14/19             PT Long Term Goals - 01/02/20 1155      PT LONG TERM GOAL #1   Title Pt to demonstrate STS with UE support independently x5 to improve ability to transfer safely and perform household ADLs.    Status Partially Met      PT LONG TERM GOAL #2   Title Pt to demo improved strength of Bil LEs, to at least 4+/5 to improve stability  and gait.     Status Partially Met      PT LONG TERM GOAL #3   Title Pt to demo safe ambulation with LRAD, with consistent ability for proper stride length and step height, for at least 252f, to improve safety and fall risk.     Status Partially Met                 Plan - 01/23/20 1148    Clinical Impression Statement Pt continues to require increase time for mobility. With gait he often freezes up when attempting to step with LLE. Difficulty on airex pad with little to no righting reaction requiring mod assist to maintain balance. Frequent cue needed to standing marches to initiate movement and to increase hip flexion.    Personal Factors and Comorbidities Age;Comorbidity 2;Past/Current Experience    Comorbidities peripheral neuropathy, HTN    Examination-Activity Limitations Bed Mobility;Bend;Lift;Stand;Stairs;Locomotion Level;Transfers    Examination-Participation Restrictions Community Activity;Interpersonal Relationship    Stability/Clinical Decision Making Stable/Uncomplicated    Rehab Potential Good    PT Frequency 2x / week    PT Treatment/Interventions  ADLs/Self Care Home Management;Cryotherapy;Electrical Stimulation;Iontophoresis 449mml Dexamethasone;Moist Heat;Therapeutic activities;Functional mobility training;Stair training;Gait training;DME Instruction;Ultrasound;Therapeutic exercise;Balance training;Neuromuscular re-education;Patient/family education;Dry needling;Passive range of motion;Manual techniques;Wheelchair mobility training;Taping;Vasopneumatic Device    PT Next Visit Plan gait training and hip flexibility; assess carryover from session to session           Patient will benefit from skilled therapeutic intervention in order to improve the following deficits and impairments:  Abnormal gait, Decreased endurance, Decreased activity tolerance, Decreased strength, Difficulty walking, Decreased mobility, Decreased balance, Improper body mechanics, Impaired flexibility, Decreased coordination, Decreased safety awareness, Pain  Visit Diagnosis: Other abnormalities of gait and mobility  Difficulty in walking, not elsewhere classified  Chronic bilateral low back pain without sciatica  Muscle weakness (generalized)     Problem List Patient Active Problem List   Diagnosis Date Noted  . Essential hypertension 12/22/2017  . History of prostate cancer 12/22/2017  . Glaucoma 12/22/2017  . Aortic stenosis 12/22/2017  . OA (osteoarthritis) 12/22/2017  . Abnormal LFTs 12/22/2017  . Thrombocytosis (HCPhilo08/29/2019  . Normocytic anemia 12/22/2017  . Hyponatremia 12/22/2017  . Cellulitis and abscess of left leg 12/21/2017  . Left ankle swelling   . Acute respiratory failure with hypoxia (HCDun[place]  . Paroxysmal supraventricular tachycardia (HCBel[place]  . Sepsis (HCCarl08/13/2019  . Acute lower UTI 12/06/2017  . Tachycardia 12/06/2017  . Leukocytosis 12/06/2017    RoScot JunPTA 01/23/2020, 11:51 AM  CoBergmanGrSilver [id number]: [phone]  Fax:  [phone]: Jeffrey FrericksRN: [id number] of Birth: 8/March 22, 1932

## 2020-01-25 ENCOUNTER — Encounter: Payer: Self-pay | Admitting: Physical Therapy

## 2020-01-25 ENCOUNTER — Ambulatory Visit: Payer: Medicare HMO | Attending: Orthopedic Surgery | Admitting: Physical Therapy

## 2020-01-25 ENCOUNTER — Other Ambulatory Visit: Payer: Self-pay

## 2020-01-25 DIAGNOSIS — R262 Difficulty in walking, not elsewhere classified: Secondary | ICD-10-CM | POA: Insufficient documentation

## 2020-01-25 DIAGNOSIS — M545 Low back pain, unspecified: Secondary | ICD-10-CM | POA: Insufficient documentation

## 2020-01-25 DIAGNOSIS — M6281 Muscle weakness (generalized): Secondary | ICD-10-CM | POA: Diagnosis not present

## 2020-01-25 DIAGNOSIS — G8929 Other chronic pain: Secondary | ICD-10-CM | POA: Diagnosis not present

## 2020-01-25 DIAGNOSIS — R2689 Other abnormalities of gait and mobility: Secondary | ICD-10-CM | POA: Diagnosis not present

## 2020-01-25 NOTE — Therapy (Signed)
[address] [place], [place], [address] Phone: [phone]   Fax:  [phone]  Physical Therapy Treatment  Patient Details  Name: Jeffrey Porter MRN: [id number] Date of Birth: 20-Jul-1931 Referring Provider (PT): Jeffrey Porter   Encounter Date: 01/25/2020   PT End of Session - 01/25/20 1147    Visit Number 19    Date for PT Re-Evaluation 02/01/20    Authorization Type Aetna Medicare    PT Start Time 1055    PT Stop Time 1145    PT Time Calculation (min) 50 min    Activity Tolerance Patient tolerated treatment well    Behavior During Therapy [facility] for tasks assessed/performed           Past Medical History:  Diagnosis Date  . Baker's cyst of knee, left   . Glaucoma   . Hypertension   . Neuromuscular disorder ([place])    peripheral neuropathy  . Prostate cancer [facility])     Past Surgical History:  Procedure Laterality Date  . BALLOON DILATION N/A 08/03/2019   Procedure: BALLOON DILATION;  Surgeon: Alexis Frock, MD;  [facility];  Service: Urology;  Laterality: N/A;  . CATARACT EXTRACTION    . CYSTOSCOPY WITH RETROGRADE URETHROGRAM N/A 08/03/2019   Procedure: CYSTOSCOPY WITH RETROGRADE URETHROGRAM;  Surgeon: Alexis Frock, MD;  [facility];  Service: Urology;  Laterality: N/A;  . HERNIA REPAIR    . INSERTION BRACHYTHERAPY DEVICE      There were no vitals filed for this visit.   Subjective Assessment - 01/25/20 1112    Subjective reports very stiff today    Currently in Pain? No/denies                             [facility] Adult PT Treatment/Exercise - 01/25/20 0001      Ambulation/Gait   Gait Comments HHA 2x 60 feet with tactile weight shift and cues for step length      Lumbar Exercises: Stretches   Passive Hamstring Stretch Right;Left;5 reps;20 seconds    Lower Trunk Rotation 4 reps;20 seconds    Piriformis Stretch Right;Left;5 reps;20 seconds    Other Lumbar Stretch Exercise  hip adductor stretch; gastroc strech 20 sec x4      Lumbar Exercises: Aerobic   Recumbent Bike 6 minutes total 3 level 0 and 3 off    Nustep L5 x 6 min, cues for longer smoot strides he will tend to do choppy small strokes      Lumbar Exercises: Seated   Other Seated Lumbar Exercises weightds ball trunk rotations       Lumbar Exercises: Supine   Other Supine Lumbar Exercises feet on ball K2C, obliques,bridges and abdominals       Knee/Hip Exercises: Standing   Hip Flexion Both;2 sets;10 reps    Hip Flexion Limitations 2#      Knee/Hip Exercises: Seated   Long Arc Quad Strengthening;Both;2 sets;10 reps    Long Arc Quad Weight 3 lbs.                    PT Short Term Goals - 12/05/19 1104      PT SHORT TERM GOAL #1   Title Pt to be independent with initial HEP     Time 2    Period Weeks    Status Achieved    Target Date 11/14/19  PT Long Term Goals - 01/02/20 1155      PT LONG TERM GOAL #1   Title Pt to demonstrate STS with UE support independently x5 to improve ability to transfer safely and perform household ADLs.    Status Partially Met      PT LONG TERM GOAL #2   Title Pt to demo improved strength of Bil LEs, to at least 4+/5 to improve stability and gait.     Status Partially Met      PT LONG TERM GOAL #3   Title Pt to demo safe ambulation with LRAD, with consistent ability for proper stride length and step height, for at least 216f, to improve safety and fall risk.     Status Partially Met                 Plan - 01/25/20 1147    Clinical Impression Statement Patient has times with the tactile weight shift and the verbal cues that he can move really well with HHA, the biggest issue with this is it able to have carry over.  As he tends to go back to the stutter steps especially has difficulty with going through doorways    PT Next Visit Plan really look next week if we should try to continue to advance him to a more indeendent and  functional gait pattern    Consulted and Agree with Plan of Care Patient           Patient will benefit from skilled therapeutic intervention in order to improve the following deficits and impairments:  Abnormal gait, Decreased endurance, Decreased activity tolerance, Decreased strength, Difficulty walking, Decreased mobility, Decreased balance, Improper body mechanics, Impaired flexibility, Decreased coordination, Decreased safety awareness, Pain  Visit Diagnosis: Other abnormalities of gait and mobility  Difficulty in walking, not elsewhere classified  Chronic bilateral low back pain without sciatica  Muscle weakness (generalized)     Problem List Patient Active Problem List   Diagnosis Date Noted  . Essential hypertension 12/22/2017  . History of prostate cancer 12/22/2017  . Glaucoma 12/22/2017  . Aortic stenosis 12/22/2017  . OA (osteoarthritis) 12/22/2017  . Abnormal LFTs 12/22/2017  . Thrombocytosis 12/22/2017  . Normocytic anemia 12/22/2017  . Hyponatremia 12/22/2017  . Cellulitis and abscess of left leg 12/21/2017  . Left ankle swelling   . Acute respiratory failure with hypoxia (HWhe[place]   . Paroxysmal supraventricular tachycardia (HYellow [place]   . Sepsis (HR[place] 12/06/2017  . Acute lower UTI 12/06/2017  . Tachycardia 12/06/2017  . Leukocytosis 12/06/2017    ASumner Boast, PT 01/25/2020, 12:06 PM  [place] [address]Phone: [phone]  Fax:  [phone] Name: JErnst CumpstonMRN: [id number] of Birth: 05/17/1931/08/27

## 2020-01-28 ENCOUNTER — Encounter: Payer: Self-pay | Admitting: Physical Therapy

## 2020-01-28 ENCOUNTER — Ambulatory Visit: Payer: Medicare HMO | Admitting: Physical Therapy

## 2020-01-28 ENCOUNTER — Other Ambulatory Visit: Payer: Self-pay

## 2020-01-28 DIAGNOSIS — R262 Difficulty in walking, not elsewhere classified: Secondary | ICD-10-CM | POA: Diagnosis not present

## 2020-01-28 DIAGNOSIS — G8929 Other chronic pain: Secondary | ICD-10-CM

## 2020-01-28 DIAGNOSIS — R2689 Other abnormalities of gait and mobility: Secondary | ICD-10-CM

## 2020-01-28 DIAGNOSIS — M545 Low back pain, unspecified: Secondary | ICD-10-CM | POA: Diagnosis not present

## 2020-01-28 DIAGNOSIS — M6281 Muscle weakness (generalized): Secondary | ICD-10-CM | POA: Diagnosis not present

## 2020-01-28 NOTE — Therapy (Signed)
[place]. [place], [place], [address] Phone: [phone]   Fax:  [phone] Progress Note Reporting Period 12/12/19 to 01/28/20 for visits 11-20 See note below for Objective Data and Assessment of Progress/Goals.      Physical Therapy Treatment  Patient Details  Name: Jeffrey Porter MRN: [id number] Date of Birth: [birth year] Referring Provider (PT): Jeffrey Porter   Encounter Date: 01/28/2020   PT End of Session - 01/28/20 1156    Visit Number 20    Date for PT Re-Evaluation 02/01/20    Authorization Type Aetna Medicare    PT Start Time 1050    PT Stop Time 1136    PT Time Calculation (min) 46 min    Activity Tolerance Patient tolerated treatment well    Behavior During Therapy [facility] for tasks assessed/performed           Past Medical History:  Diagnosis Date  . Baker's cyst of knee, left   . Glaucoma   . Hypertension   . Neuromuscular disorder ([place])    peripheral neuropathy  . Prostate cancer [facility])     Past Surgical History:  Procedure Laterality Date  . BALLOON DILATION N/A 08/03/2019   Procedure: BALLOON DILATION;  Surgeon: Jeffrey Frock, MD;  [facility];  Service: Urology;  Laterality: N/A;  . CATARACT EXTRACTION    . CYSTOSCOPY WITH RETROGRADE URETHROGRAM N/A 08/03/2019   Procedure: CYSTOSCOPY WITH RETROGRADE URETHROGRAM;  Surgeon: Jeffrey Frock, MD;  [facility];  Service: Urology;  Laterality: N/A;  . HERNIA REPAIR    . INSERTION BRACHYTHERAPY DEVICE      There were no vitals filed for this visit.   Subjective Assessment - 01/28/20 1108    Subjective Patient reports that his knees are very stiff and sore    Currently in Pain? Yes    Pain Score 4     Pain Location Knee    Pain Orientation Left    Pain Descriptors / Indicators Tightness;Sore    Aggravating Factors  weather                             [facility] Adult PT Treatment/Exercise - 01/28/20 0001       High Level Balance   High Level Balance Comments ball toss      Lumbar Exercises: Stretches   Passive Hamstring Stretch Right;Left;5 reps;20 seconds    Lower Trunk Rotation 4 reps;20 seconds    ITB Stretch Right;Left;4 reps;20 seconds    Piriformis Stretch Right;Left;5 reps;20 seconds    Other Lumbar Stretch Exercise hip adductor stretch; gastroc strech 20 sec x4      Lumbar Exercises: Aerobic   Nustep L5 x 6 min, cues for longer smoot strides he will tend to do choppy small strokes      Lumbar Exercises: Supine   Dead Bug 20 reps;1 second    Bridge 15 reps    Other Supine Lumbar Exercises feet on ball K2C, obliques,bridges and abdominals       Lumbar Exercises: Prone   Other Prone Lumbar Exercises childs pose , prone on elbows    Other Prone Lumbar Exercises quad stretches      Lumbar Exercises: Quadruped   [place] Horse 10 reps    Opposite Arm/Leg Raise 20 reps;1 second                    PT Short Term Goals - 12/05/19  [place] #1   Title Pt to be independent with initial HEP     Time 2    Period Weeks    Status Achieved    Target Date 11/14/19             PT Long Term Goals - 01/02/20 1155      PT LONG TERM GOAL #1   Title Pt to demonstrate STS with UE support independently x5 to improve ability to transfer safely and perform household ADLs.    Status Partially Met      PT LONG TERM GOAL #2   Title Pt to demo improved strength of Bil LEs, to at least 4+/5 to improve stability and gait.     Status Partially Met      PT LONG TERM GOAL #3   Title Pt to demo safe ambulation with LRAD, with consistent ability for proper stride length and step height, for at least 246f, to improve safety and fall risk.     Status Partially Met                 Plan - 01/28/20 1158    Clinical Impression Statement Patient with some increased knee pain today, did not want to "stress the knees". so we tried more quadraped and prone  activities, he did well with this, his trunk and LE's are very tight.  He and his caregiver have started walking and doing the HHA with the gait.  Which will be some good crryover    PT Next Visit Plan will need to renew           Patient will benefit from skilled therapeutic intervention in order to improve the following deficits and impairments:  Abnormal gait, Decreased endurance, Decreased activity tolerance, Decreased strength, Difficulty walking, Decreased mobility, Decreased balance, Improper body mechanics, Impaired flexibility, Decreased coordination, Decreased safety awareness, Pain  Visit Diagnosis: Other abnormalities of gait and mobility  Difficulty in walking, not elsewhere classified  Chronic bilateral low back pain without sciatica  Muscle weakness (generalized)     Problem List Patient Active Problem List   Diagnosis Date Noted  . Essential hypertension 12/22/2017  . History of prostate cancer 12/22/2017  . Glaucoma 12/22/2017  . Aortic stenosis 12/22/2017  . OA (osteoarthritis) 12/22/2017  . Abnormal LFTs 12/22/2017  . Thrombocytosis 12/22/2017  . Normocytic anemia 12/22/2017  . Hyponatremia 12/22/2017  . Cellulitis and abscess of left leg 12/21/2017  . Left ankle swelling   . Acute respiratory failure with hypoxia ([facility]   . Paroxysmal supraventricular tachycardia ([facility]   . Sepsis ([place] 12/06/2017  . Acute lower UTI 12/06/2017  . Tachycardia 12/06/2017  . Leukocytosis 12/06/2017    ASumner Boast, PT 01/28/2020, 12:01 PM  [place] [address]Phone: [phone]  Fax:  [phone] Name: JEldo UmanzorMRN: [id number] of Birth: 807-07-1931

## 2020-02-01 ENCOUNTER — Other Ambulatory Visit: Payer: Self-pay

## 2020-02-01 ENCOUNTER — Encounter: Payer: Self-pay | Admitting: Physical Therapy

## 2020-02-01 ENCOUNTER — Ambulatory Visit: Payer: Medicare HMO | Admitting: Physical Therapy

## 2020-02-01 DIAGNOSIS — M6281 Muscle weakness (generalized): Secondary | ICD-10-CM | POA: Diagnosis not present

## 2020-02-01 DIAGNOSIS — R262 Difficulty in walking, not elsewhere classified: Secondary | ICD-10-CM | POA: Diagnosis not present

## 2020-02-01 DIAGNOSIS — R2689 Other abnormalities of gait and mobility: Secondary | ICD-10-CM | POA: Diagnosis not present

## 2020-02-01 DIAGNOSIS — M545 Low back pain, unspecified: Secondary | ICD-10-CM

## 2020-02-01 DIAGNOSIS — G8929 Other chronic pain: Secondary | ICD-10-CM

## 2020-02-01 NOTE — Therapy (Signed)
[place]. [place], [place], [address] Phone: [phone]   Fax:  [phone]  Physical Therapy Treatment  Patient Details  Name: Jeffrey Porter MRN: [id number] Date of Birth: 08-15-1931 Referring Provider (PT): [name]   Encounter Date: 02/01/2020   PT End of Session - 02/01/20 1142    Visit Number 21    Date for PT Re-Evaluation 03/04/20    Authorization Type Aetna Medicare    PT Start Time 3151    PT Stop Time 1140    PT Time Calculation (min) 49 min    Activity Tolerance Patient tolerated treatment well    Behavior During Therapy [facility] for tasks assessed/performed           Past Medical History:  Diagnosis Date   Baker's cyst of knee, left    Glaucoma    Hypertension    Neuromuscular disorder ([place])    peripheral neuropathy   Prostate cancer [facility])     Past Surgical History:  Procedure Laterality Date   BALLOON DILATION N/A 08/03/2019   Procedure: BALLOON DILATION;  Surgeon: Alexis Frock, MD;  [facility];  Service: Urology;  Laterality: N/A;   CATARACT EXTRACTION     CYSTOSCOPY WITH RETROGRADE URETHROGRAM N/A 08/03/2019   Procedure: CYSTOSCOPY WITH RETROGRADE URETHROGRAM;  Surgeon: Alexis Frock, MD;  [facility];  Service: Urology;  Laterality: N/A;   HERNIA REPAIR     INSERTION BRACHYTHERAPY DEVICE      There were no vitals filed for this visit.   Subjective Assessment - 02/01/20 1053    Subjective Patient with c/o left knee pain, reports that he has had knee pain fir years    Currently in Pain? Yes    Pain Score 6     Pain Location Knee    Pain Orientation Left    Aggravating Factors  just hurts              [facility] PT Assessment - 02/01/20 0001      Assessment   Medical Diagnosis Gait Abnormality    Referring Provider (PT) [name]                         [facility] Adult PT Treatment/Exercise - 02/01/20 0001      Ambulation/Gait   Gait  Comments HHA x 110 feet, really a lot of cues to not reach out and touch the door when going through as he has a lot of difficulty with this      High Level Balance   High Level Balance Comments standing on airex reaching, and tband two way scapular stabilization      Lumbar Exercises: Stretches   Passive Hamstring Stretch Right;Left;5 reps;20 seconds    Lower Trunk Rotation 4 reps;20 seconds    ITB Stretch Right;Left;4 reps;20 seconds    Other Lumbar Stretch Exercise showed him and helped him do this with a stretch strap, talked wiht       Lumbar Exercises: Aerobic   Nustep L5 x 6 min, cues for longer smoot strides he will tend to do choppy small strokes      Lumbar Exercises: Seated   Other Seated Lumbar Exercises physioball toss working on balance and core      Lumbar Exercises: Supine   Dead Bug 20 reps;1 second    Bridge with [employer] 20 reps;1 second    Other Supine Lumbar Exercises feet on ball K2C, obliques,bridges and  abdominals       Lumbar Exercises: Quadruped   Opposite Arm/Leg Raise 20 reps;1 second      Knee/Hip Exercises: Seated   Long Arc Quad Strengthening;Both;2 sets;10 reps    Other Seated Knee/Hip Exercises red tband hip ER    Hamstring Curl Strengthening;Both;2 sets;10 reps    Hamstring Limitations green TB                    PT Short Term Goals - 12/05/19 1104      PT SHORT TERM GOAL #1   Title Pt to be independent with initial HEP     Time 2    Period Weeks    Status Achieved    Target Date 11/14/19             PT Long Term Goals - 02/01/20 1146      PT LONG TERM GOAL #1   Title Pt to demonstrate STS with UE support independently x5 to improve ability to transfer safely and perform household ADLs.    Status Partially Met      PT LONG TERM GOAL #2   Title Pt to demo improved strength of Bil LEs, to at least 4+/5 to improve stability and gait.     Status Achieved      PT LONG TERM GOAL #3   Title Pt to demo safe ambulation  with LRAD, with consistent ability for proper stride length and step height, for at least 257f, to improve safety and fall risk.     Status Partially Met      PT LONG TERM GOAL #4   Title Pt to be independent with final HEP    Status Partially Met                 Plan - 02/01/20 1144    Clinical Impression Statement Patietn seems to be doing better with his movements, he still we get stuck at times but is able to get moving again with a little verbal cue, he has not gotten stuck in the festenating pattern in a while,  He works hard and we are doing flexibility, core and extremity strength as well as gait and balance.  This period we will try to educate him and his caregiver to be able to continue on their own at home    PT Frequency 2x / week    PT Duration 4 weeks    PT Treatment/Interventions ADLs/Self Care Home Management;Cryotherapy;Electrical Stimulation;Iontophoresis 447mml Dexamethasone;Moist Heat;Therapeutic activities;Functional mobility training;Stair training;Gait training;DME Instruction;Ultrasound;Therapeutic exercise;Balance training;Neuromuscular re-education;Patient/family education;Dry needling;Passive range of motion;Manual techniques;Wheelchair mobility training;Taping;Vasopneumatic Device    PT Next Visit Plan over the next period work on his ability to work with caregiver on own at home to continue to progress    Consulted and Agree with Plan of Care Patient           Patient will benefit from skilled therapeutic intervention in order to improve the following deficits and impairments:  Abnormal gait, Decreased endurance, Decreased activity tolerance, Decreased strength, Difficulty walking, Decreased mobility, Decreased balance, Improper body mechanics, Impaired flexibility, Decreased coordination, Decreased safety awareness, Pain  Visit Diagnosis: Other abnormalities of gait and mobility - Plan: PT plan of care cert/re-cert  Difficulty in walking, not elsewhere  classified - Plan: PT plan of care cert/re-cert  Chronic bilateral low back pain without sciatica - Plan: PT plan of care cert/re-cert  Muscle weakness (generalized) - Plan: PT plan of care cert/re-cert  Problem List Patient Active Problem List   Diagnosis Date Noted   Essential hypertension 12/22/2017   History of prostate cancer 12/22/2017   Glaucoma 12/22/2017   Aortic stenosis 12/22/2017   OA (osteoarthritis) 12/22/2017   Abnormal LFTs 12/22/2017   Thrombocytosis 12/22/2017   Normocytic anemia 12/22/2017   Hyponatremia 12/22/2017   Cellulitis and abscess of left leg 12/21/2017   Left ankle swelling    Acute respiratory failure with hypoxia (HCC)    Paroxysmal supraventricular tachycardia ([place])    Sepsis ([place]) 12/06/2017   Acute lower UTI 12/06/2017   Tachycardia 12/06/2017   Leukocytosis 12/06/2017    Sumner Boast., PT 02/01/2020, 11:48 AM  [place]. [place], [place], [address] Phone: [phone]   Fax:  [phone]  Name: Bach Rocchi MRN: [id number] Date of Birth: 1931-09-26

## 2020-02-06 ENCOUNTER — Encounter: Payer: Medicare HMO | Admitting: Physical Therapy

## 2020-02-08 ENCOUNTER — Encounter: Payer: Self-pay | Admitting: Physical Therapy

## 2020-02-08 ENCOUNTER — Ambulatory Visit: Payer: Medicare HMO | Admitting: Physical Therapy

## 2020-02-08 ENCOUNTER — Other Ambulatory Visit: Payer: Self-pay

## 2020-02-08 DIAGNOSIS — M545 Low back pain, unspecified: Secondary | ICD-10-CM

## 2020-02-08 DIAGNOSIS — G8929 Other chronic pain: Secondary | ICD-10-CM | POA: Diagnosis not present

## 2020-02-08 DIAGNOSIS — M6281 Muscle weakness (generalized): Secondary | ICD-10-CM | POA: Diagnosis not present

## 2020-02-08 DIAGNOSIS — R262 Difficulty in walking, not elsewhere classified: Secondary | ICD-10-CM | POA: Diagnosis not present

## 2020-02-08 DIAGNOSIS — R2689 Other abnormalities of gait and mobility: Secondary | ICD-10-CM | POA: Diagnosis not present

## 2020-02-11 ENCOUNTER — Ambulatory Visit: Payer: Medicare HMO | Admitting: Physical Therapy

## 2020-02-11 ENCOUNTER — Other Ambulatory Visit: Payer: Self-pay

## 2020-02-11 ENCOUNTER — Encounter: Payer: Self-pay | Admitting: Physical Therapy

## 2020-02-11 DIAGNOSIS — M6281 Muscle weakness (generalized): Secondary | ICD-10-CM

## 2020-02-11 DIAGNOSIS — R262 Difficulty in walking, not elsewhere classified: Secondary | ICD-10-CM

## 2020-02-11 DIAGNOSIS — M545 Low back pain, unspecified: Secondary | ICD-10-CM

## 2020-02-11 DIAGNOSIS — G8929 Other chronic pain: Secondary | ICD-10-CM | POA: Diagnosis not present

## 2020-02-11 DIAGNOSIS — R2689 Other abnormalities of gait and mobility: Secondary | ICD-10-CM | POA: Diagnosis not present

## 2020-02-11 NOTE — Therapy (Signed)
[place]. [place], [place], [address] Phone: [phone]   Fax:  [phone]  Physical Therapy Treatment  Patient Details  Name: Jeffrey Porter MRN: [id number] Date of Birth: 10-27-1931 Referring Provider (PT): [name]   Encounter Date: 02/08/2020    Past Medical History:  Diagnosis Date  . Baker's cyst of knee, left   . Glaucoma   . Hypertension   . Neuromuscular disorder ([place])    peripheral neuropathy  . Prostate cancer [facility])     Past Surgical History:  Procedure Laterality Date  . BALLOON DILATION N/A 08/03/2019   Procedure: BALLOON DILATION;  Surgeon: Alexis Frock, MD;  [facility];  Service: Urology;  Laterality: N/A;  . CATARACT EXTRACTION    . CYSTOSCOPY WITH RETROGRADE URETHROGRAM N/A 08/03/2019   Procedure: CYSTOSCOPY WITH RETROGRADE URETHROGRAM;  Surgeon: Alexis Frock, MD;  [facility];  Service: Urology;  Laterality: N/A;  . HERNIA REPAIR    . INSERTION BRACHYTHERAPY DEVICE      There were no vitals filed for this visit.                      [place] Adult PT Treatment/Exercise - 02/11/20 0001      Ambulation/Gait   Gait Comments HHA 90'x2 working on not slowing with turns and with going through doors      Lumbar Exercises: Stretches   Passive Hamstring Stretch Right;Left;5 reps;20 seconds    Lower Trunk Rotation 4 reps;20 seconds    ITB Stretch Right;Left;4 reps;20 seconds    Piriformis Stretch Right;Left;5 reps;20 seconds    Other Lumbar Stretch Exercise hip adductor stretch; gastroc strech 20 sec x4      Lumbar Exercises: Aerobic   Nustep Level 3 to decrease stress on the knee, x 6 minutes      Lumbar Exercises: Seated   Other Seated Lumbar Exercises physioball toss working on balance and core      Lumbar Exercises: Supine   Dead Bug 20 reps;2 seconds    Bridge with [employer] 20 reps;2 seconds    Bridge with clamshell 20 reps;2 seconds    Other  Supine Lumbar Exercises feet on ball K2C, obliques,bridges and abdominals       Lumbar Exercises: Quadruped   Opposite Arm/Leg Raise 20 reps;1 second                    PT Short Term Goals - 12/05/19 1104      PT SHORT TERM GOAL #1   Title Pt to be independent with initial HEP     Time 2    Period Weeks    Status Achieved    Target Date 11/14/19             PT Long Term Goals - 02/01/20 1146      PT LONG TERM GOAL #1   Title Pt to demonstrate STS with UE support independently x5 to improve ability to transfer safely and perform household ADLs.    Status Partially Met      PT LONG TERM GOAL #2   Title Pt to demo improved strength of Bil LEs, to at least 4+/5 to improve stability and gait.     Status Achieved      PT LONG TERM GOAL #3   Title Pt to demo safe ambulation with LRAD, with consistent ability for proper stride length and step height, for at least 225f, to improve safety  and fall risk.     Status Partially Met      PT LONG TERM GOAL #4   Title Pt to be independent with final HEP    Status Partially Met                  Patient will benefit from skilled therapeutic intervention in order to improve the following deficits and impairments:  Abnormal gait, Decreased endurance, Decreased activity tolerance, Decreased strength, Difficulty walking, Decreased mobility, Decreased balance, Improper body mechanics, Impaired flexibility, Decreased coordination, Decreased safety awareness, Pain  Visit Diagnosis: Other abnormalities of gait and mobility  Difficulty in walking, not elsewhere classified  Chronic bilateral low back pain without sciatica  Muscle weakness (generalized)     Problem List Patient Active Problem List   Diagnosis Date Noted  . Essential hypertension 12/22/2017  . History of prostate cancer 12/22/2017  . Glaucoma 12/22/2017  . Aortic stenosis 12/22/2017  . OA (osteoarthritis) 12/22/2017  . Abnormal LFTs 12/22/2017  .  Thrombocytosis 12/22/2017  . Normocytic anemia 12/22/2017  . Hyponatremia 12/22/2017  . Cellulitis and abscess of left leg 12/21/2017  . Left ankle swelling   . Acute respiratory failure with hypoxia ([place])   . Paroxysmal supraventricular tachycardia ([place])   . Sepsis ([place]) 12/06/2017  . Acute lower UTI 12/06/2017  . Tachycardia 12/06/2017  . Leukocytosis 12/06/2017    Sumner Boast., PT 02/11/2020, 11:58 AM  [place]. [place], [place], [address] Phone: [phone]   Fax:  [phone]  Name: Jeffrey Porter MRN: [id number] Date of Birth: [birth year]

## 2020-02-11 NOTE — Therapy (Signed)
[facility] [address] [place], [place], [address] Phone: [phone]   Fax:  [phone]  Physical Therapy Treatment  Patient Details  Name: Jeffrey Porter MRN: [id number] Date of Birth: 12/18/1931 Referring Provider (PT): Lisa Miller   Encounter Date: 02/11/2020   PT End of Session - 02/11/20 1143    Visit Number 23    Date for PT Re-Evaluation 03/04/20    Authorization Type Aetna Medicare    PT Start Time 1100    PT Stop Time 1145    PT Time Calculation (min) 45 min    Activity Tolerance Patient tolerated treatment well    Behavior During Therapy [facility] for tasks assessed/performed           Past Medical History:  Diagnosis Date  . Baker's cyst of knee, left   . Glaucoma   . Hypertension   . Neuromuscular disorder (HCC)    peripheral neuropathy  . Prostate cancer (HCC)     Past Surgical History:  Procedure Laterality Date  . BALLOON DILATION N/A 08/03/2019   Procedure: BALLOON DILATION;  Surgeon: Manny, Theodore, MD;  [facility];  Service: Urology;  Laterality: N/A;  . CATARACT EXTRACTION    . CYSTOSCOPY WITH RETROGRADE URETHROGRAM N/A 08/03/2019   Procedure: CYSTOSCOPY WITH RETROGRADE URETHROGRAM;  Surgeon: Manny, Theodore, MD;  [facility];  Service: Urology;  Laterality: N/A;  . HERNIA REPAIR    . INSERTION BRACHYTHERAPY DEVICE      There were no vitals filed for this visit.   Subjective Assessment - 02/11/20 1107    Subjective "ok"    Currently in Pain? No/denies                             [facility] Adult PT Treatment/Exercise - 02/11/20 0001      Lumbar Exercises: Stretches   Passive Hamstring Stretch Right;Left;5 reps;20 seconds    Double Knee to Chest Stretch 2 reps;10 seconds    Lower Trunk Rotation 4 reps;20 seconds      Lumbar Exercises: Aerobic   Recumbent Bike L1 x 2 min     Nustep Level 3 to decrease stress on the knee, x 6 minutes      Lumbar Exercises: Seated    Other Seated Lumbar Exercises Rows Green Tband 2x105    Other Seated Lumbar Exercises physioball toss working on balance and core      Knee/Hip Exercises: Standing   Hip Abduction Stengthening;Both;2 sets;10 reps    Abduction Limitations 3#    Hip Extension Both;1 set;10 reps    Extension Limitations 3#      Knee/Hip Exercises: Seated   Ball Squeeze x20 3 sec hold                    PT Short Term Goals - 12/05/19 1104      PT SHORT TERM GOAL #1   Title Pt to be independent with initial HEP     Time 2    Period Weeks    Status Achieved    Target Date 11/14/19             PT Long Term Goals - 02/01/20 1146      PT LONG TERM GOAL #1   Title Pt to demonstrate STS with UE support independently x5 to improve ability to transfer safely and perform household ADLs.    Status Partially Met        PT LONG TERM GOAL #2   Title Pt to demo improved strength of Bil LEs, to at least 4+/5 to improve stability and gait.     Status Achieved      PT LONG TERM GOAL #3   Title Pt to demo safe ambulation with LRAD, with consistent ability for proper stride length and step height, for at least 281f, to improve safety and fall risk.     Status Partially Met      PT LONG TERM GOAL #4   Title Pt to be independent with final HEP    Status Partially Met                 Plan - 02/11/20 1144    Clinical Impression Statement All interventions completed well some L groin pain with alt 6 in box taps. He has a stiff trunk with seated ball toss. Increase time needed for mobility, he continues to freeze up when walking. He is very stiff int LE with passive stretching.    Personal Factors and Comorbidities Age;Comorbidity 2;Past/Current Experience    Comorbidities peripheral neuropathy, HTN    Examination-Activity Limitations Bed Mobility;Bend;Lift;Stand;Stairs;Locomotion Level;Transfers    Examination-Participation Restrictions Community Activity;Interpersonal Relationship     Stability/Clinical Decision Making Stable/Uncomplicated    Rehab Potential Good    PT Frequency 2x / week    PT Duration 4 weeks    PT Treatment/Interventions ADLs/Self Care Home Management;Cryotherapy;Electrical Stimulation;Iontophoresis 443mml Dexamethasone;Moist Heat;Therapeutic activities;Functional mobility training;Stair training;Gait training;DME Instruction;Ultrasound;Therapeutic exercise;Balance training;Neuromuscular re-education;Patient/family education;Dry needling;Passive range of motion;Manual techniques;Wheelchair mobility training;Taping;Vasopneumatic Device    PT Next Visit Plan over the next period work on his ability to work with caregiver on own at home to continue to progress           Patient will benefit from skilled therapeutic intervention in order to improve the following deficits and impairments:  Abnormal gait, Decreased endurance, Decreased activity tolerance, Decreased strength, Difficulty walking, Decreased mobility, Decreased balance, Improper body mechanics, Impaired flexibility, Decreased coordination, Decreased safety awareness, Pain  Visit Diagnosis: Difficulty in walking, not elsewhere classified  Chronic bilateral low back pain without sciatica  Muscle weakness (generalized)  Other abnormalities of gait and mobility     Problem List Patient Active Problem List   Diagnosis Date Noted  . Essential hypertension 12/22/2017  . History of prostate cancer 12/22/2017  . Glaucoma 12/22/2017  . Aortic stenosis 12/22/2017  . OA (osteoarthritis) 12/22/2017  . Abnormal LFTs 12/22/2017  . Thrombocytosis 12/22/2017  . Normocytic anemia 12/22/2017  . Hyponatremia 12/22/2017  . Cellulitis and abscess of left leg 12/21/2017  . Left ankle swelling   . Acute respiratory failure with hypoxia ([place]  . Paroxysmal supraventricular tachycardia ([place]  . Sepsis ([place]/13/2019  . Acute lower UTI 12/06/2017  . Tachycardia 12/06/2017  . Leukocytosis 12/06/2017     [place] Jun0/18/2021, 11:49 AM  [place]: [phone] Fax:  [phone]: JuGlenard KeeslingRN: [id number] of Birth: [birth year]

## 2020-02-13 ENCOUNTER — Other Ambulatory Visit: Payer: Self-pay

## 2020-02-13 ENCOUNTER — Ambulatory Visit: Payer: Medicare HMO | Admitting: Physical Therapy

## 2020-02-13 ENCOUNTER — Encounter: Payer: Self-pay | Admitting: Physical Therapy

## 2020-02-13 DIAGNOSIS — R262 Difficulty in walking, not elsewhere classified: Secondary | ICD-10-CM | POA: Diagnosis not present

## 2020-02-13 DIAGNOSIS — M545 Low back pain, unspecified: Secondary | ICD-10-CM

## 2020-02-13 DIAGNOSIS — G8929 Other chronic pain: Secondary | ICD-10-CM | POA: Diagnosis not present

## 2020-02-13 DIAGNOSIS — R2689 Other abnormalities of gait and mobility: Secondary | ICD-10-CM

## 2020-02-13 DIAGNOSIS — M6281 Muscle weakness (generalized): Secondary | ICD-10-CM

## 2020-02-13 NOTE — Therapy (Signed)
[place] [place], [place], [address] Phone: [phone]   Fax:  [phone]  Physical Therapy Treatment  Patient Details  Name: Jeffrey Porter MRN: [id number] Date of Birth: [birth year] Referring Provider (PT): [name]   Encounter Date: 02/13/2020   PT End of Session - 02/13/20 1150    Visit Number 24    Date for PT Re-Evaluation 03/04/20    Authorization Type [employer]    PT Start Time 1050    PT Stop Time 1140    PT Time Calculation (min) 50 min    Activity Tolerance Patient tolerated treatment well    Behavior During Therapy [facility] for tasks assessed/performed           Past Medical History:  Diagnosis Date  . Baker's cyst of knee, left   . Glaucoma   . Hypertension   . Neuromuscular disorder ([place])    peripheral neuropathy  . Prostate cancer [facility])     Past Surgical History:  Procedure Laterality Date  . BALLOON DILATION N/A 08/03/2019   Procedure: BALLOON DILATION;  Surgeon: Alexis Frock, MD;  Location: [facility];  Service: Urology;  Laterality: N/A;  . CATARACT EXTRACTION    . CYSTOSCOPY WITH RETROGRADE URETHROGRAM N/A 08/03/2019   Procedure: CYSTOSCOPY WITH RETROGRADE URETHROGRAM;  Surgeon: Alexis Frock, MD;  Location: [facility];  Service: Urology;  Laterality: N/A;  . HERNIA REPAIR    . INSERTION BRACHYTHERAPY DEVICE      There were no vitals filed for this visit.   Subjective Assessment - 02/13/20 1059    Subjective I feel off balance, reports some soreness in teh low back and the knee    Currently in Pain? No/denies                             [facility] Adult PT Treatment/Exercise - 02/13/20 0001      Ambulation/Gait   Gait Comments HHA 90'x2 working on not slowing with turns and with going through doors      Lumbar Exercises: Stretches   Passive Hamstring Stretch Right;Left;5 reps;20 seconds    Lower Trunk Rotation 4 reps;20 seconds      Lumbar Exercises: Aerobic    Nustep Level 3 to decrease stress on the knee, x 7 minutes      Lumbar Exercises: Seated   Long Arc Quad on Chair Both;2 sets;10 reps    LAQ on Chair Weights (lbs) 3#    LAQ on Chair Limitations a lot of cues for bigger ROM and to go slow    Other Seated Lumbar Exercises physioball toss working on balance and core, weighted ball toss, ball kicks, trunk twist with weighted ball      Lumbar Exercises: Supine   Dead Bug 20 reps;2 seconds    Bridge with Ball Squeeze 20 reps;2 seconds    Bridge with clamshell 20 reps;2 seconds                  PT Education - 02/13/20 1149    Education Details went over with caregiver walking and the HEP    Person(s) Educated Patient;Caregiver(s)    Methods Explanation;Demonstration;Tactile cues;Verbal cues    Comprehension Verbalized understanding            PT Short Term Goals - 12/05/19 1104      PT SHORT TERM GOAL #1   Title Pt to be independent with initial HEP  Time 2    Period Weeks    Status Achieved    Target Date 11/14/19             PT Long Term Goals - 02/13/20 1153      PT LONG TERM GOAL #1   Title Pt to demonstrate STS with UE support independently x5 to improve ability to transfer safely and perform household ADLs.    Status Achieved      PT LONG TERM GOAL #2   Title Pt to demo improved strength of Bil LEs, to at least 4+/5 to improve stability and gait.     Status Achieved      PT LONG TERM GOAL #3   Title Pt to demo safe ambulation with LRAD, with consistent ability for proper stride length and step height, for at least 236f, to improve safety and fall risk.       PT LONG TERM GOAL #4   Title Pt to be independent with final HEP    Status Achieved                 Plan - 02/13/20 1150    Clinical Impression Statement We will D/C the patient today, overall he is moving better, with HHA he can walk 100 feet in a functional time, when festenating he can break the pattern on his own.  The carry  over just has not been there with the [place] he has a lot of festenating and has a lot more difficulty breaking the pattern, he then becomes much less funcitonal due to the tim eit takes.  He has a good HEP and his caregiver can walk with him with the HHA    PT Next Visit Plan D/C most goals met    Consulted and Agree with Plan of Care Patient           Patient will benefit from skilled therapeutic intervention in order to improve the following deficits and impairments:     Visit Diagnosis: Difficulty in walking, not elsewhere classified  Chronic bilateral low back pain without sciatica  Muscle weakness (generalized)  Other abnormalities of gait and mobility     Problem List Patient Active Problem List   Diagnosis Date Noted  . Essential hypertension 12/22/2017  . History of prostate cancer 12/22/2017  . Glaucoma 12/22/2017  . Aortic stenosis 12/22/2017  . OA (osteoarthritis) 12/22/2017  . Abnormal LFTs 12/22/2017  . Thrombocytosis 12/22/2017  . Normocytic anemia 12/22/2017  . Hyponatremia 12/22/2017  . Cellulitis and abscess of left leg 12/21/2017  . Left ankle swelling   . Acute respiratory failure with hypoxia ([place]   . Paroxysmal [place]ricular tachycardia ([place]   . Sepsis ([place] 12/06/2017  . Acute lower UTI 12/06/2017  . Tachycardia 12/06/2017  . Leukocytosis 12/06/2017    ASumner Boast, PT 02/13/2020, 11:54 AM  [place] [address]Phone: [phone]  Fax:  [phone] Name: Jeffrey HyerMRN: [id number] of Birth: [birth year]

## 2020-06-04 DIAGNOSIS — H35362 Drusen (degenerative) of macula, left eye: Secondary | ICD-10-CM | POA: Diagnosis not present

## 2020-06-18 DIAGNOSIS — M25511 Pain in right shoulder: Secondary | ICD-10-CM | POA: Diagnosis not present

## 2020-06-18 DIAGNOSIS — M5441 Lumbago with sciatica, right side: Secondary | ICD-10-CM | POA: Diagnosis not present

## 2020-06-18 DIAGNOSIS — G8929 Other chronic pain: Secondary | ICD-10-CM | POA: Diagnosis not present

## 2020-06-20 ENCOUNTER — Other Ambulatory Visit: Payer: Self-pay | Admitting: Sports Medicine

## 2020-06-20 DIAGNOSIS — M5441 Lumbago with sciatica, right side: Secondary | ICD-10-CM

## 2020-06-24 DIAGNOSIS — M25511 Pain in right shoulder: Secondary | ICD-10-CM | POA: Diagnosis not present

## 2020-07-09 ENCOUNTER — Ambulatory Visit
Admission: RE | Admit: 2020-07-09 | Discharge: 2020-07-09 | Disposition: A | Discharge status: Home / Self Care | Payer: Medicare HMO | Source: Ambulatory Visit | Attending: Sports Medicine | Admitting: Sports Medicine

## 2020-07-09 ENCOUNTER — Other Ambulatory Visit: Payer: Self-pay

## 2020-07-09 DIAGNOSIS — M5441 Lumbago with sciatica, right side: Secondary | ICD-10-CM

## 2020-07-09 DIAGNOSIS — M48061 Spinal stenosis, lumbar region without neurogenic claudication: Secondary | ICD-10-CM | POA: Diagnosis not present

## 2020-07-09 DIAGNOSIS — M545 Low back pain, unspecified: Secondary | ICD-10-CM | POA: Diagnosis not present

## 2020-07-14 DIAGNOSIS — G8929 Other chronic pain: Secondary | ICD-10-CM | POA: Diagnosis not present

## 2020-07-14 DIAGNOSIS — Z8546 Personal history of malignant neoplasm of prostate: Secondary | ICD-10-CM | POA: Diagnosis not present

## 2020-07-25 DIAGNOSIS — M25511 Pain in right shoulder: Secondary | ICD-10-CM | POA: Diagnosis not present

## 2020-07-25 DIAGNOSIS — M545 Low back pain, unspecified: Secondary | ICD-10-CM | POA: Diagnosis not present

## 2020-07-25 DIAGNOSIS — I350 Nonrheumatic aortic (valve) stenosis: Secondary | ICD-10-CM | POA: Diagnosis not present

## 2020-07-25 DIAGNOSIS — Z8546 Personal history of malignant neoplasm of prostate: Secondary | ICD-10-CM | POA: Diagnosis not present

## 2020-07-25 DIAGNOSIS — G8929 Other chronic pain: Secondary | ICD-10-CM | POA: Diagnosis not present

## 2020-07-25 DIAGNOSIS — M47816 Spondylosis without myelopathy or radiculopathy, lumbar region: Secondary | ICD-10-CM | POA: Diagnosis not present

## 2020-08-28 DIAGNOSIS — G8929 Other chronic pain: Secondary | ICD-10-CM | POA: Diagnosis not present

## 2020-08-28 DIAGNOSIS — M5442 Lumbago with sciatica, left side: Secondary | ICD-10-CM | POA: Diagnosis not present

## 2020-08-30 DIAGNOSIS — H409 Unspecified glaucoma: Secondary | ICD-10-CM | POA: Diagnosis not present

## 2020-08-30 DIAGNOSIS — I350 Nonrheumatic aortic (valve) stenosis: Secondary | ICD-10-CM | POA: Diagnosis not present

## 2020-08-30 DIAGNOSIS — Z9181 History of falling: Secondary | ICD-10-CM | POA: Diagnosis not present

## 2020-08-30 DIAGNOSIS — M5442 Lumbago with sciatica, left side: Secondary | ICD-10-CM | POA: Diagnosis not present

## 2020-08-30 DIAGNOSIS — Z8546 Personal history of malignant neoplasm of prostate: Secondary | ICD-10-CM | POA: Diagnosis not present

## 2020-08-30 DIAGNOSIS — Z87891 Personal history of nicotine dependence: Secondary | ICD-10-CM | POA: Diagnosis not present

## 2020-08-30 DIAGNOSIS — G8929 Other chronic pain: Secondary | ICD-10-CM | POA: Diagnosis not present

## 2020-08-30 DIAGNOSIS — I351 Nonrheumatic aortic (valve) insufficiency: Secondary | ICD-10-CM | POA: Diagnosis not present

## 2020-08-30 DIAGNOSIS — Z86010 Personal history of colonic polyps: Secondary | ICD-10-CM | POA: Diagnosis not present

## 2020-08-30 DIAGNOSIS — Z8701 Personal history of pneumonia (recurrent): Secondary | ICD-10-CM | POA: Diagnosis not present

## 2020-09-04 DIAGNOSIS — I350 Nonrheumatic aortic (valve) stenosis: Secondary | ICD-10-CM | POA: Diagnosis not present

## 2020-09-04 DIAGNOSIS — Z86010 Personal history of colonic polyps: Secondary | ICD-10-CM | POA: Diagnosis not present

## 2020-09-04 DIAGNOSIS — I351 Nonrheumatic aortic (valve) insufficiency: Secondary | ICD-10-CM | POA: Diagnosis not present

## 2020-09-04 DIAGNOSIS — Z9181 History of falling: Secondary | ICD-10-CM | POA: Diagnosis not present

## 2020-09-04 DIAGNOSIS — G8929 Other chronic pain: Secondary | ICD-10-CM | POA: Diagnosis not present

## 2020-09-04 DIAGNOSIS — Z87891 Personal history of nicotine dependence: Secondary | ICD-10-CM | POA: Diagnosis not present

## 2020-09-04 DIAGNOSIS — H409 Unspecified glaucoma: Secondary | ICD-10-CM | POA: Diagnosis not present

## 2020-09-04 DIAGNOSIS — M5442 Lumbago with sciatica, left side: Secondary | ICD-10-CM | POA: Diagnosis not present

## 2020-09-04 DIAGNOSIS — Z8701 Personal history of pneumonia (recurrent): Secondary | ICD-10-CM | POA: Diagnosis not present

## 2020-09-04 DIAGNOSIS — Z8546 Personal history of malignant neoplasm of prostate: Secondary | ICD-10-CM | POA: Diagnosis not present

## 2020-09-05 DIAGNOSIS — Z87891 Personal history of nicotine dependence: Secondary | ICD-10-CM | POA: Diagnosis not present

## 2020-09-05 DIAGNOSIS — I351 Nonrheumatic aortic (valve) insufficiency: Secondary | ICD-10-CM | POA: Diagnosis not present

## 2020-09-05 DIAGNOSIS — M5442 Lumbago with sciatica, left side: Secondary | ICD-10-CM | POA: Diagnosis not present

## 2020-09-05 DIAGNOSIS — Z86010 Personal history of colonic polyps: Secondary | ICD-10-CM | POA: Diagnosis not present

## 2020-09-05 DIAGNOSIS — I350 Nonrheumatic aortic (valve) stenosis: Secondary | ICD-10-CM | POA: Diagnosis not present

## 2020-09-05 DIAGNOSIS — H409 Unspecified glaucoma: Secondary | ICD-10-CM | POA: Diagnosis not present

## 2020-09-05 DIAGNOSIS — Z9181 History of falling: Secondary | ICD-10-CM | POA: Diagnosis not present

## 2020-09-05 DIAGNOSIS — Z8546 Personal history of malignant neoplasm of prostate: Secondary | ICD-10-CM | POA: Diagnosis not present

## 2020-09-05 DIAGNOSIS — Z8701 Personal history of pneumonia (recurrent): Secondary | ICD-10-CM | POA: Diagnosis not present

## 2020-09-05 DIAGNOSIS — G8929 Other chronic pain: Secondary | ICD-10-CM | POA: Diagnosis not present

## 2020-09-08 DIAGNOSIS — Z8546 Personal history of malignant neoplasm of prostate: Secondary | ICD-10-CM | POA: Diagnosis not present

## 2020-09-08 DIAGNOSIS — G8929 Other chronic pain: Secondary | ICD-10-CM | POA: Diagnosis not present

## 2020-09-08 DIAGNOSIS — Z87891 Personal history of nicotine dependence: Secondary | ICD-10-CM | POA: Diagnosis not present

## 2020-09-08 DIAGNOSIS — H409 Unspecified glaucoma: Secondary | ICD-10-CM | POA: Diagnosis not present

## 2020-09-08 DIAGNOSIS — I351 Nonrheumatic aortic (valve) insufficiency: Secondary | ICD-10-CM | POA: Diagnosis not present

## 2020-09-08 DIAGNOSIS — Z86010 Personal history of colonic polyps: Secondary | ICD-10-CM | POA: Diagnosis not present

## 2020-09-08 DIAGNOSIS — M5442 Lumbago with sciatica, left side: Secondary | ICD-10-CM | POA: Diagnosis not present

## 2020-09-08 DIAGNOSIS — Z8701 Personal history of pneumonia (recurrent): Secondary | ICD-10-CM | POA: Diagnosis not present

## 2020-09-08 DIAGNOSIS — Z9181 History of falling: Secondary | ICD-10-CM | POA: Diagnosis not present

## 2020-09-08 DIAGNOSIS — I350 Nonrheumatic aortic (valve) stenosis: Secondary | ICD-10-CM | POA: Diagnosis not present

## 2020-09-12 DIAGNOSIS — I351 Nonrheumatic aortic (valve) insufficiency: Secondary | ICD-10-CM | POA: Diagnosis not present

## 2020-09-12 DIAGNOSIS — Z8701 Personal history of pneumonia (recurrent): Secondary | ICD-10-CM | POA: Diagnosis not present

## 2020-09-12 DIAGNOSIS — Z87891 Personal history of nicotine dependence: Secondary | ICD-10-CM | POA: Diagnosis not present

## 2020-09-12 DIAGNOSIS — I350 Nonrheumatic aortic (valve) stenosis: Secondary | ICD-10-CM | POA: Diagnosis not present

## 2020-09-12 DIAGNOSIS — Z86010 Personal history of colonic polyps: Secondary | ICD-10-CM | POA: Diagnosis not present

## 2020-09-12 DIAGNOSIS — M5442 Lumbago with sciatica, left side: Secondary | ICD-10-CM | POA: Diagnosis not present

## 2020-09-12 DIAGNOSIS — G8929 Other chronic pain: Secondary | ICD-10-CM | POA: Diagnosis not present

## 2020-09-12 DIAGNOSIS — H409 Unspecified glaucoma: Secondary | ICD-10-CM | POA: Diagnosis not present

## 2020-09-12 DIAGNOSIS — Z8546 Personal history of malignant neoplasm of prostate: Secondary | ICD-10-CM | POA: Diagnosis not present

## 2020-09-12 DIAGNOSIS — Z9181 History of falling: Secondary | ICD-10-CM | POA: Diagnosis not present

## 2020-09-13 DIAGNOSIS — N390 Urinary tract infection, site not specified: Secondary | ICD-10-CM | POA: Diagnosis not present

## 2020-09-15 DIAGNOSIS — I350 Nonrheumatic aortic (valve) stenosis: Secondary | ICD-10-CM | POA: Diagnosis not present

## 2020-09-15 DIAGNOSIS — Z8546 Personal history of malignant neoplasm of prostate: Secondary | ICD-10-CM | POA: Diagnosis not present

## 2020-09-15 DIAGNOSIS — Z86010 Personal history of colonic polyps: Secondary | ICD-10-CM | POA: Diagnosis not present

## 2020-09-15 DIAGNOSIS — I351 Nonrheumatic aortic (valve) insufficiency: Secondary | ICD-10-CM | POA: Diagnosis not present

## 2020-09-15 DIAGNOSIS — H409 Unspecified glaucoma: Secondary | ICD-10-CM | POA: Diagnosis not present

## 2020-09-15 DIAGNOSIS — Z8701 Personal history of pneumonia (recurrent): Secondary | ICD-10-CM | POA: Diagnosis not present

## 2020-09-15 DIAGNOSIS — Z87891 Personal history of nicotine dependence: Secondary | ICD-10-CM | POA: Diagnosis not present

## 2020-09-15 DIAGNOSIS — Z9181 History of falling: Secondary | ICD-10-CM | POA: Diagnosis not present

## 2020-09-15 DIAGNOSIS — M5442 Lumbago with sciatica, left side: Secondary | ICD-10-CM | POA: Diagnosis not present

## 2020-09-15 DIAGNOSIS — G8929 Other chronic pain: Secondary | ICD-10-CM | POA: Diagnosis not present

## 2020-09-19 DIAGNOSIS — Z8701 Personal history of pneumonia (recurrent): Secondary | ICD-10-CM | POA: Diagnosis not present

## 2020-09-19 DIAGNOSIS — Z86010 Personal history of colonic polyps: Secondary | ICD-10-CM | POA: Diagnosis not present

## 2020-09-19 DIAGNOSIS — I351 Nonrheumatic aortic (valve) insufficiency: Secondary | ICD-10-CM | POA: Diagnosis not present

## 2020-09-19 DIAGNOSIS — Z8546 Personal history of malignant neoplasm of prostate: Secondary | ICD-10-CM | POA: Diagnosis not present

## 2020-09-19 DIAGNOSIS — I350 Nonrheumatic aortic (valve) stenosis: Secondary | ICD-10-CM | POA: Diagnosis not present

## 2020-09-19 DIAGNOSIS — Z9181 History of falling: Secondary | ICD-10-CM | POA: Diagnosis not present

## 2020-09-19 DIAGNOSIS — H409 Unspecified glaucoma: Secondary | ICD-10-CM | POA: Diagnosis not present

## 2020-09-19 DIAGNOSIS — Z87891 Personal history of nicotine dependence: Secondary | ICD-10-CM | POA: Diagnosis not present

## 2020-09-19 DIAGNOSIS — M5442 Lumbago with sciatica, left side: Secondary | ICD-10-CM | POA: Diagnosis not present

## 2020-09-19 DIAGNOSIS — G8929 Other chronic pain: Secondary | ICD-10-CM | POA: Diagnosis not present

## 2020-09-25 DIAGNOSIS — I350 Nonrheumatic aortic (valve) stenosis: Secondary | ICD-10-CM | POA: Diagnosis not present

## 2020-09-25 DIAGNOSIS — M5442 Lumbago with sciatica, left side: Secondary | ICD-10-CM | POA: Diagnosis not present

## 2020-09-25 DIAGNOSIS — Z9181 History of falling: Secondary | ICD-10-CM | POA: Diagnosis not present

## 2020-09-25 DIAGNOSIS — Z8701 Personal history of pneumonia (recurrent): Secondary | ICD-10-CM | POA: Diagnosis not present

## 2020-09-25 DIAGNOSIS — I351 Nonrheumatic aortic (valve) insufficiency: Secondary | ICD-10-CM | POA: Diagnosis not present

## 2020-09-25 DIAGNOSIS — H409 Unspecified glaucoma: Secondary | ICD-10-CM | POA: Diagnosis not present

## 2020-09-25 DIAGNOSIS — Z86010 Personal history of colonic polyps: Secondary | ICD-10-CM | POA: Diagnosis not present

## 2020-09-25 DIAGNOSIS — Z8546 Personal history of malignant neoplasm of prostate: Secondary | ICD-10-CM | POA: Diagnosis not present

## 2020-09-25 DIAGNOSIS — Z87891 Personal history of nicotine dependence: Secondary | ICD-10-CM | POA: Diagnosis not present

## 2020-09-25 DIAGNOSIS — G8929 Other chronic pain: Secondary | ICD-10-CM | POA: Diagnosis not present

## 2020-09-29 DIAGNOSIS — Z86010 Personal history of colonic polyps: Secondary | ICD-10-CM | POA: Diagnosis not present

## 2020-09-29 DIAGNOSIS — H409 Unspecified glaucoma: Secondary | ICD-10-CM | POA: Diagnosis not present

## 2020-09-29 DIAGNOSIS — Z8546 Personal history of malignant neoplasm of prostate: Secondary | ICD-10-CM | POA: Diagnosis not present

## 2020-09-29 DIAGNOSIS — Z87891 Personal history of nicotine dependence: Secondary | ICD-10-CM | POA: Diagnosis not present

## 2020-09-29 DIAGNOSIS — Z8701 Personal history of pneumonia (recurrent): Secondary | ICD-10-CM | POA: Diagnosis not present

## 2020-09-29 DIAGNOSIS — I350 Nonrheumatic aortic (valve) stenosis: Secondary | ICD-10-CM | POA: Diagnosis not present

## 2020-09-29 DIAGNOSIS — I351 Nonrheumatic aortic (valve) insufficiency: Secondary | ICD-10-CM | POA: Diagnosis not present

## 2020-09-29 DIAGNOSIS — Z9181 History of falling: Secondary | ICD-10-CM | POA: Diagnosis not present

## 2020-09-29 DIAGNOSIS — G8929 Other chronic pain: Secondary | ICD-10-CM | POA: Diagnosis not present

## 2020-09-29 DIAGNOSIS — M5442 Lumbago with sciatica, left side: Secondary | ICD-10-CM | POA: Diagnosis not present

## 2020-10-10 DIAGNOSIS — Z86010 Personal history of colonic polyps: Secondary | ICD-10-CM | POA: Diagnosis not present

## 2020-10-10 DIAGNOSIS — H409 Unspecified glaucoma: Secondary | ICD-10-CM | POA: Diagnosis not present

## 2020-10-10 DIAGNOSIS — I350 Nonrheumatic aortic (valve) stenosis: Secondary | ICD-10-CM | POA: Diagnosis not present

## 2020-10-10 DIAGNOSIS — Z9181 History of falling: Secondary | ICD-10-CM | POA: Diagnosis not present

## 2020-10-10 DIAGNOSIS — M5442 Lumbago with sciatica, left side: Secondary | ICD-10-CM | POA: Diagnosis not present

## 2020-10-10 DIAGNOSIS — G8929 Other chronic pain: Secondary | ICD-10-CM | POA: Diagnosis not present

## 2020-10-10 DIAGNOSIS — Z87891 Personal history of nicotine dependence: Secondary | ICD-10-CM | POA: Diagnosis not present

## 2020-10-10 DIAGNOSIS — Z8701 Personal history of pneumonia (recurrent): Secondary | ICD-10-CM | POA: Diagnosis not present

## 2020-10-10 DIAGNOSIS — I351 Nonrheumatic aortic (valve) insufficiency: Secondary | ICD-10-CM | POA: Diagnosis not present

## 2020-10-10 DIAGNOSIS — Z8546 Personal history of malignant neoplasm of prostate: Secondary | ICD-10-CM | POA: Diagnosis not present

## 2020-10-13 DIAGNOSIS — G8929 Other chronic pain: Secondary | ICD-10-CM | POA: Diagnosis not present

## 2020-10-13 DIAGNOSIS — R29898 Other symptoms and signs involving the musculoskeletal system: Secondary | ICD-10-CM | POA: Diagnosis not present

## 2020-10-13 DIAGNOSIS — I872 Venous insufficiency (chronic) (peripheral): Secondary | ICD-10-CM | POA: Diagnosis not present

## 2020-10-13 DIAGNOSIS — M5441 Lumbago with sciatica, right side: Secondary | ICD-10-CM | POA: Diagnosis not present

## 2020-10-16 DIAGNOSIS — M5442 Lumbago with sciatica, left side: Secondary | ICD-10-CM | POA: Diagnosis not present

## 2020-10-16 DIAGNOSIS — I351 Nonrheumatic aortic (valve) insufficiency: Secondary | ICD-10-CM | POA: Diagnosis not present

## 2020-10-16 DIAGNOSIS — I350 Nonrheumatic aortic (valve) stenosis: Secondary | ICD-10-CM | POA: Diagnosis not present

## 2020-10-16 DIAGNOSIS — Z9181 History of falling: Secondary | ICD-10-CM | POA: Diagnosis not present

## 2020-10-16 DIAGNOSIS — Z8546 Personal history of malignant neoplasm of prostate: Secondary | ICD-10-CM | POA: Diagnosis not present

## 2020-10-16 DIAGNOSIS — Z8701 Personal history of pneumonia (recurrent): Secondary | ICD-10-CM | POA: Diagnosis not present

## 2020-10-16 DIAGNOSIS — H409 Unspecified glaucoma: Secondary | ICD-10-CM | POA: Diagnosis not present

## 2020-10-16 DIAGNOSIS — Z87891 Personal history of nicotine dependence: Secondary | ICD-10-CM | POA: Diagnosis not present

## 2020-10-16 DIAGNOSIS — G8929 Other chronic pain: Secondary | ICD-10-CM | POA: Diagnosis not present

## 2020-10-16 DIAGNOSIS — Z86010 Personal history of colonic polyps: Secondary | ICD-10-CM | POA: Diagnosis not present

## 2020-10-24 DIAGNOSIS — I351 Nonrheumatic aortic (valve) insufficiency: Secondary | ICD-10-CM | POA: Diagnosis not present

## 2020-10-24 DIAGNOSIS — Z8546 Personal history of malignant neoplasm of prostate: Secondary | ICD-10-CM | POA: Diagnosis not present

## 2020-10-24 DIAGNOSIS — Z8701 Personal history of pneumonia (recurrent): Secondary | ICD-10-CM | POA: Diagnosis not present

## 2020-10-24 DIAGNOSIS — Z87891 Personal history of nicotine dependence: Secondary | ICD-10-CM | POA: Diagnosis not present

## 2020-10-24 DIAGNOSIS — M5442 Lumbago with sciatica, left side: Secondary | ICD-10-CM | POA: Diagnosis not present

## 2020-10-24 DIAGNOSIS — Z9181 History of falling: Secondary | ICD-10-CM | POA: Diagnosis not present

## 2020-10-24 DIAGNOSIS — G8929 Other chronic pain: Secondary | ICD-10-CM | POA: Diagnosis not present

## 2020-10-24 DIAGNOSIS — Z86010 Personal history of colonic polyps: Secondary | ICD-10-CM | POA: Diagnosis not present

## 2020-10-24 DIAGNOSIS — I350 Nonrheumatic aortic (valve) stenosis: Secondary | ICD-10-CM | POA: Diagnosis not present

## 2020-10-24 DIAGNOSIS — H409 Unspecified glaucoma: Secondary | ICD-10-CM | POA: Diagnosis not present

## 2020-11-28 DIAGNOSIS — M47816 Spondylosis without myelopathy or radiculopathy, lumbar region: Secondary | ICD-10-CM | POA: Diagnosis not present

## 2020-11-28 DIAGNOSIS — G214 Vascular parkinsonism: Secondary | ICD-10-CM | POA: Diagnosis not present

## 2020-11-28 DIAGNOSIS — Z0000 Encounter for general adult medical examination without abnormal findings: Secondary | ICD-10-CM | POA: Diagnosis not present

## 2020-11-28 DIAGNOSIS — H401190 Primary open-angle glaucoma, unspecified eye, stage unspecified: Secondary | ICD-10-CM | POA: Diagnosis not present

## 2020-11-28 DIAGNOSIS — H6123 Impacted cerumen, bilateral: Secondary | ICD-10-CM | POA: Diagnosis not present

## 2020-12-03 DIAGNOSIS — M5416 Radiculopathy, lumbar region: Secondary | ICD-10-CM | POA: Diagnosis not present

## 2021-01-06 DIAGNOSIS — M5416 Radiculopathy, lumbar region: Secondary | ICD-10-CM | POA: Diagnosis not present

## 2021-01-24 DIAGNOSIS — N390 Urinary tract infection, site not specified: Secondary | ICD-10-CM | POA: Diagnosis not present

## 2021-01-24 DIAGNOSIS — R319 Hematuria, unspecified: Secondary | ICD-10-CM | POA: Diagnosis not present

## 2021-02-16 DIAGNOSIS — M5416 Radiculopathy, lumbar region: Secondary | ICD-10-CM | POA: Diagnosis not present

## 2021-03-13 DIAGNOSIS — H401131 Primary open-angle glaucoma, bilateral, mild stage: Secondary | ICD-10-CM | POA: Diagnosis not present

## 2021-06-18 ENCOUNTER — Emergency Department (HOSPITAL_COMMUNITY): Payer: Medicare HMO

## 2021-06-18 ENCOUNTER — Other Ambulatory Visit: Payer: Self-pay

## 2021-06-18 ENCOUNTER — Inpatient Hospital Stay (HOSPITAL_COMMUNITY)
Admission: EM | Admit: 2021-06-18 | Discharge: 2021-06-23 | DRG: 195 | Disposition: A | Discharge status: Skilled Nursing Facility | Payer: Medicare HMO | Attending: Internal Medicine | Admitting: Internal Medicine

## 2021-06-18 ENCOUNTER — Encounter (HOSPITAL_COMMUNITY): Payer: Self-pay

## 2021-06-18 DIAGNOSIS — Z8546 Personal history of malignant neoplasm of prostate: Secondary | ICD-10-CM | POA: Diagnosis not present

## 2021-06-18 DIAGNOSIS — Z79899 Other long term (current) drug therapy: Secondary | ICD-10-CM | POA: Diagnosis not present

## 2021-06-18 DIAGNOSIS — R5381 Other malaise: Secondary | ICD-10-CM | POA: Diagnosis present

## 2021-06-18 DIAGNOSIS — R7989 Other specified abnormal findings of blood chemistry: Secondary | ICD-10-CM | POA: Diagnosis present

## 2021-06-18 DIAGNOSIS — R509 Fever, unspecified: Secondary | ICD-10-CM | POA: Diagnosis not present

## 2021-06-18 DIAGNOSIS — Z66 Do not resuscitate: Secondary | ICD-10-CM | POA: Diagnosis not present

## 2021-06-18 DIAGNOSIS — Z87891 Personal history of nicotine dependence: Secondary | ICD-10-CM

## 2021-06-18 DIAGNOSIS — R5383 Other fatigue: Secondary | ICD-10-CM

## 2021-06-18 DIAGNOSIS — Z888 Allergy status to other drugs, medicaments and biological substances status: Secondary | ICD-10-CM

## 2021-06-18 DIAGNOSIS — M1990 Unspecified osteoarthritis, unspecified site: Secondary | ICD-10-CM | POA: Diagnosis not present

## 2021-06-18 DIAGNOSIS — G214 Vascular parkinsonism: Secondary | ICD-10-CM | POA: Diagnosis present

## 2021-06-18 DIAGNOSIS — R6510 Systemic inflammatory response syndrome (SIRS) of non-infectious origin without acute organ dysfunction: Secondary | ICD-10-CM | POA: Diagnosis present

## 2021-06-18 DIAGNOSIS — K760 Fatty (change of) liver, not elsewhere classified: Secondary | ICD-10-CM | POA: Diagnosis present

## 2021-06-18 DIAGNOSIS — J189 Pneumonia, unspecified organism: Secondary | ICD-10-CM | POA: Diagnosis not present

## 2021-06-18 DIAGNOSIS — Z7401 Bed confinement status: Secondary | ICD-10-CM | POA: Diagnosis not present

## 2021-06-18 DIAGNOSIS — R531 Weakness: Secondary | ICD-10-CM | POA: Diagnosis not present

## 2021-06-18 DIAGNOSIS — I10 Essential (primary) hypertension: Secondary | ICD-10-CM | POA: Diagnosis not present

## 2021-06-18 DIAGNOSIS — H409 Unspecified glaucoma: Secondary | ICD-10-CM | POA: Diagnosis present

## 2021-06-18 DIAGNOSIS — N2889 Other specified disorders of kidney and ureter: Secondary | ICD-10-CM | POA: Diagnosis not present

## 2021-06-18 DIAGNOSIS — R059 Cough, unspecified: Secondary | ICD-10-CM | POA: Diagnosis not present

## 2021-06-18 DIAGNOSIS — J069 Acute upper respiratory infection, unspecified: Secondary | ICD-10-CM

## 2021-06-18 DIAGNOSIS — Z20822 Contact with and (suspected) exposure to covid-19: Secondary | ICD-10-CM | POA: Diagnosis not present

## 2021-06-18 DIAGNOSIS — R54 Age-related physical debility: Secondary | ICD-10-CM | POA: Diagnosis not present

## 2021-06-18 DIAGNOSIS — E538 Deficiency of other specified B group vitamins: Secondary | ICD-10-CM | POA: Diagnosis not present

## 2021-06-18 DIAGNOSIS — E876 Hypokalemia: Secondary | ICD-10-CM

## 2021-06-18 DIAGNOSIS — R0602 Shortness of breath: Secondary | ICD-10-CM | POA: Diagnosis not present

## 2021-06-18 DIAGNOSIS — W19XXXA Unspecified fall, initial encounter: Secondary | ICD-10-CM | POA: Diagnosis not present

## 2021-06-18 DIAGNOSIS — Z743 Need for continuous supervision: Secondary | ICD-10-CM | POA: Diagnosis not present

## 2021-06-18 DIAGNOSIS — I959 Hypotension, unspecified: Secondary | ICD-10-CM | POA: Diagnosis not present

## 2021-06-18 DIAGNOSIS — R7401 Elevation of levels of liver transaminase levels: Secondary | ICD-10-CM | POA: Diagnosis not present

## 2021-06-18 LAB — URINALYSIS, ROUTINE W REFLEX MICROSCOPIC
Bilirubin Urine: NEGATIVE
Glucose, UA: NEGATIVE mg/dL
Ketones, ur: 20 mg/dL — AB
Leukocytes,Ua: NEGATIVE
Nitrite: NEGATIVE
Protein, ur: 30 mg/dL — AB
Specific Gravity, Urine: 1.019 (ref 1.005–1.030)
pH: 5 (ref 5.0–8.0)

## 2021-06-18 LAB — RESP PANEL BY RT-PCR (FLU A&B, COVID) ARPGX2
Influenza A by PCR: NEGATIVE
Influenza B by PCR: NEGATIVE
SARS Coronavirus 2 by RT PCR: NEGATIVE

## 2021-06-18 LAB — CBC WITH DIFFERENTIAL/PLATELET
Abs Immature Granulocytes: 0.02 10*3/uL (ref 0.00–0.07)
Basophils Absolute: 0 10*3/uL (ref 0.0–0.1)
Basophils Relative: 0 %
Eosinophils Absolute: 0 10*3/uL (ref 0.0–0.5)
Eosinophils Relative: 0 %
HCT: 33.6 % — ABNORMAL LOW (ref 39.0–52.0)
Hemoglobin: 11.4 g/dL — ABNORMAL LOW (ref 13.0–17.0)
Immature Granulocytes: 0 %
Lymphocytes Relative: 7 %
Lymphs Abs: 0.6 10*3/uL — ABNORMAL LOW (ref 0.7–4.0)
MCH: 30 pg (ref 26.0–34.0)
MCHC: 33.9 g/dL (ref 30.0–36.0)
MCV: 88.4 fL (ref 80.0–100.0)
Monocytes Absolute: 0.7 10*3/uL (ref 0.1–1.0)
Monocytes Relative: 7 %
Neutro Abs: 7.9 10*3/uL — ABNORMAL HIGH (ref 1.7–7.7)
Neutrophils Relative %: 86 %
Platelets: 269 10*3/uL (ref 150–400)
RBC: 3.8 MIL/uL — ABNORMAL LOW (ref 4.22–5.81)
RDW: 12.9 % (ref 11.5–15.5)
WBC: 9.3 10*3/uL (ref 4.0–10.5)
nRBC: 0 % (ref 0.0–0.2)

## 2021-06-18 LAB — COMPREHENSIVE METABOLIC PANEL
ALT: 44 U/L (ref 0–44)
AST: 82 U/L — ABNORMAL HIGH (ref 15–41)
Albumin: 2.7 g/dL — ABNORMAL LOW (ref 3.5–5.0)
Alkaline Phosphatase: 47 U/L (ref 38–126)
Anion gap: 4 — ABNORMAL LOW (ref 5–15)
BUN: 19 mg/dL (ref 8–23)
CO2: 20 mmol/L — ABNORMAL LOW (ref 22–32)
Calcium: 6.2 mg/dL — CL (ref 8.9–10.3)
Chloride: 113 mmol/L — ABNORMAL HIGH (ref 98–111)
Creatinine, Ser: 0.61 mg/dL (ref 0.61–1.24)
GFR, Estimated: >60 mL/min (ref >60)
Glucose, Bld: 88 mg/dL (ref 70–99)
Potassium: 2.7 mmol/L — CL (ref 3.5–5.1)
Sodium: 137 mmol/L (ref 135–145)
Total Bilirubin: 1.7 mg/dL — ABNORMAL HIGH (ref 0.3–1.2)
Total Protein: 4.7 g/dL — ABNORMAL LOW (ref 6.5–8.1)

## 2021-06-18 LAB — BRAIN NATRIURETIC PEPTIDE: B Natriuretic Peptide: 321.3 pg/mL — ABNORMAL HIGH (ref 0.0–100.0)

## 2021-06-18 LAB — TSH: TSH: 1.116 u[IU]/mL (ref 0.350–4.500)

## 2021-06-18 LAB — MAGNESIUM: Magnesium: 1.6 mg/dL — ABNORMAL LOW (ref 1.7–2.4)

## 2021-06-18 LAB — LACTIC ACID, PLASMA: Lactic Acid, Venous: 1.7 mmol/L (ref 0.5–1.9)

## 2021-06-18 MED ORDER — CALCIUM GLUCONATE-NACL 1-0.675 GM/50ML-% IV SOLN
1.0000 g | Freq: Once | INTRAVENOUS | Status: AC
  Administered 2021-06-19: 1000 mg via INTRAVENOUS
  Filled 2021-06-18: qty 50

## 2021-06-18 MED ORDER — POTASSIUM CHLORIDE 10 MEQ/100ML IV SOLN
10.0000 meq | INTRAVENOUS | Status: AC
  Administered 2021-06-18 (×3): 10 meq via INTRAVENOUS
  Filled 2021-06-18 (×3): qty 100

## 2021-06-18 MED ORDER — CALCIUM CARBONATE ANTACID 500 MG PO CHEW
1.0000 | CHEWABLE_TABLET | Freq: Once | ORAL | Status: AC
  Administered 2021-06-18: 200 mg via ORAL
  Filled 2021-06-18: qty 1

## 2021-06-18 MED ORDER — POTASSIUM CHLORIDE CRYS ER 20 MEQ PO TBCR
40.0000 meq | EXTENDED_RELEASE_TABLET | Freq: Once | ORAL | Status: AC
  Administered 2021-06-18: 40 meq via ORAL
  Filled 2021-06-18: qty 2

## 2021-06-18 NOTE — ED Triage Notes (Signed)
Pt coming from home via EMS with c/o weakness, fever, productive cough with yellow sputum, and chills since Sunday. Pt has dark urine, hx UTIs. Pt also reports that he slid on the floor this morning and could not get up, reports no injuries. A&O x 4.

## 2021-06-18 NOTE — ED Provider Notes (Signed)
[facility] DEPT Provider Note   CSN: [id number] Arrival date & time: 06/18/21  1655     History  Chief Complaint  Patient presents with   Weakness   Fever   Cough    Jeffrey Porter is a 88 y.o. male.  The history is provided by the patient, a relative and medical records. No language interpreter was used.  Weakness Severity:  Severe Onset quality:  Gradual Duration:  4 days Timing:  Constant Progression:  Worsening Chronicity:  New Relieved by:  Nothing Worsened by:  Nothing Associated symptoms: cough, dysuria and fever   Associated symptoms: no abdominal pain, no aphasia, no ataxia, no chest pain, no diarrhea, no dizziness, no drooling, no numbness in extremities, no falls, no foul-smelling urine, no frequency, no headaches, no loss of consciousness, no melena, no nausea, no shortness of breath, no vision change and no vomiting   Fever Associated symptoms: chills, congestion, cough and dysuria   Associated symptoms: no chest pain, no confusion, no diarrhea, no headaches, no nausea, no rash and no vomiting   Cough Associated symptoms: chills and fever   Associated symptoms: no chest pain, no diaphoresis, no headaches, no rash, no shortness of breath and no wheezing       Home Medications Prior to Admission medications   Medication Sig Start Date End Date Taking? Authorizing Provider  acetaminophen (TYLENOL) 500 MG tablet Take 500 mg by mouth daily as needed for moderate pain.    [provider]  amoxicillin (AMOXIL) 500 MG tablet Take 500 mg by mouth 3 (three) times daily. Started 08/03/19 7 day supply 07/26/19   [provider]  Carbidopa-Levodopa ER (SINEMET CR) 25-100 MG tablet controlled release TAKE 1 TABLET BY MOUTH THREE TIMES A DAY Patient not taking: Reported on 05/19/2018 03/13/18   Tat, Eustace Quail, DO  cephALEXin (KEFLEX) 500 MG capsule Take 1 capsule (500 mg total) by mouth 2 (two) times daily. X 3 days. Begin day  before next Urology appointment. 08/03/19   Alexis Frock, MD  latanoprost (XALATAN) 0.005 % ophthalmic solution Place 1 drop into both eyes at bedtime.     [provider]      Allergies    Timolol maleate    Review of Systems   Review of Systems  Constitutional:  Positive for chills, fatigue and fever. Negative for diaphoresis.  HENT:  Positive for congestion. Negative for drooling.   Eyes:  Negative for visual disturbance.  Respiratory:  Positive for cough. Negative for chest tightness, shortness of breath, wheezing and stridor.   Cardiovascular:  Positive for leg swelling (chronic). Negative for chest pain and palpitations.  Gastrointestinal:  Negative for abdominal pain, diarrhea, melena, nausea and vomiting.  Genitourinary:  Positive for dysuria. Negative for flank pain and frequency.  Musculoskeletal:  Negative for back pain, falls, neck pain and neck stiffness.  Skin:  Negative for rash and wound.  Neurological:  Positive for weakness. Negative for dizziness, loss of consciousness, syncope, light-headedness, numbness and headaches.  Psychiatric/Behavioral:  Negative for agitation and confusion.   All other systems reviewed and are negative.  Physical Exam Updated Vital Signs BP (!) 123/58 (BP Location: Right Arm)    Pulse 91    Temp (!) 100.6 F (38.1 C) (Oral)    Resp 20    Ht 5\' 3"  (1.6 m)    Wt 63.5 kg    SpO2 99%    BMI 24.80 kg/m  Physical Exam Vitals and nursing note reviewed.  Constitutional:      General: He is not in acute distress.    Appearance: He is well-developed. He is not ill-appearing, toxic-appearing or diaphoretic.  HENT:     Head: Normocephalic and atraumatic.     Nose: Congestion present. No rhinorrhea.     Mouth/Throat:     Mouth: Mucous membranes are moist.     Pharynx: No oropharyngeal exudate or posterior oropharyngeal erythema.  Eyes:     Extraocular Movements: Extraocular movements intact.     Conjunctiva/sclera: Conjunctivae normal.      Pupils: Pupils are equal, round, and reactive to light.  Cardiovascular:     Rate and Rhythm: Normal rate and regular rhythm.     Pulses: Normal pulses.     Heart sounds: Murmur heard.  Pulmonary:     Effort: Pulmonary effort is normal. No respiratory distress.     Breath sounds: Normal breath sounds. No wheezing, rhonchi or rales.  Chest:     Chest wall: No tenderness.  Abdominal:     General: Abdomen is flat.     Palpations: Abdomen is soft.     Tenderness: There is no abdominal tenderness. There is no right CVA tenderness, left CVA tenderness, guarding or rebound.  Musculoskeletal:        General: No swelling or tenderness.     Cervical back: Neck supple. No tenderness.     Right lower leg: Edema present.     Left lower leg: Edema present.  Skin:    General: Skin is warm and dry.     Capillary Refill: Capillary refill takes less than 2 seconds.     Findings: No erythema or rash.  Neurological:     General: No focal deficit present.     Mental Status: He is alert.     Sensory: No sensory deficit.     Motor: No weakness.  Psychiatric:        Mood and Affect: Mood normal.    ED Results / Procedures / Treatments   Labs (all labs ordered are listed, but only abnormal results are displayed) Labs Reviewed  COMPREHENSIVE METABOLIC PANEL - Abnormal; Notable for the following components:      Result Value   Potassium 2.7 (*)    Chloride 113 (*)    CO2 20 (*)    Calcium 6.2 (*)    Total Protein 4.7 (*)    Albumin 2.7 (*)    AST 82 (*)    Total Bilirubin 1.7 (*)    Anion gap 4 (*)    All other components within normal limits  CBC WITH DIFFERENTIAL/PLATELET - Abnormal; Notable for the following components:   RBC 3.80 (*)    Hemoglobin 11.4 (*)    HCT 33.6 (*)    Neutro Abs 7.9 (*)    Lymphs Abs 0.6 (*)    All other components within normal limits  URINALYSIS, ROUTINE W REFLEX MICROSCOPIC - Abnormal; Notable for the following components:   Color, Urine AMBER (*)     Hgb urine dipstick MODERATE (*)    Ketones, ur 20 (*)    Protein, ur 30 (*)    Bacteria, UA RARE (*)    All other components within normal limits  BRAIN NATRIURETIC PEPTIDE - Abnormal; Notable for the following components:   B Natriuretic Peptide 321.3 (*)    All other components within normal limits  RESP PANEL BY RT-PCR (FLU A&B, COVID) ARPGX2  URINE CULTURE  CULTURE, BLOOD (ROUTINE X 2)  CULTURE, BLOOD (  ROUTINE X 2)  LACTIC ACID, PLASMA  TSH    EKG None  Radiology DG Chest 2 View  Result Date: 06/18/2021 CLINICAL DATA:  Productive cough EXAM: CHEST - 2 VIEW COMPARISON:  None. FINDINGS: Heart size upper normal. Vascularity normal. Lungs are clear without infiltrate or effusion. Interval clearing of bilateral airspace disease seen in 2019. IMPRESSION: No active cardiopulmonary disease. Electronically Signed   By: Franchot Gallo M.D.   On: 06/18/2021 18:03    Procedures Procedures    Medications Ordered in ED Medications  potassium chloride 10 mEq in 100 mL IVPB (10 mEq Intravenous New Bag/Given 06/18/21 2223)  potassium chloride SA (KLOR-CON M) CR tablet 40 mEq (40 mEq Oral Given 06/18/21 2111)  calcium carbonate (TUMS - dosed in mg elemental calcium) chewable tablet 200 mg of elemental calcium (200 mg of elemental calcium Oral Given 06/18/21 2114)    ED Course/ Medical Decision Making/ A&P                           Medical Decision Making Amount and/or Complexity of Data Reviewed Labs: ordered. Radiology: ordered.  Risk OTC drugs. Prescription drug management.    Almond Fitzgibbon is a 88 y.o. male with a past medical history significant for previous UTIs, PSVT, hypertension, arthritis, peripheral neuropathies, and prostate cancer who presents with several days of worsening fatigue, chills, fever for 2 days, cough for 4 days, and dysuria.  According to patient, he has had symptoms of fatigue and URI symptoms with cough and some UTI symptoms for the last few days with  dysuria.  He said that a grandchild at home has had some cold symptoms.  Patient said that he has had fever for the last 2 days but today was so tired he could not get out of bed and was brought to the emergency department for evaluation.  He denies nausea, vomiting, chest pain, abdominal pain.  Denies any headache, neck pain, or neck stiffness.  Denies any focal neurologic deficits.  Just feels tired all over with no energy and cannot get up.  He denies any new leg pain or leg swelling.  He reports the swelling in his legs is normal for him.  He denies any trauma.  On exam, lungs were clear and chest was nontender.  Abdomen was nontender.  Patient's legs had some edema bilaterally but were nontender.  Good pulses, strength, and sensation in extremities.  No focal neurologic deficits initially.  Patient was warm to the touch and was febrile.  Other vital signs are reassuring on my exam without hypoxia, tachypnea, or tachycardia.  He was not hypotensive for me.  Clinically suspect patient has a viral URI given a family member with similar cold symptoms.  We will check for COVID and flu and will also get x-ray to look for pneumonia and screening labs including UA with culture given his dysuria and previous UTIs.  Anticipate shared decision-making conversation about disposition after work-up is completed.  I am somewhat concerned given the patient's age and reports that he cannot get out of bed due to the worsened fatigue however will reassess after work-up is completed to determine disposition.  10:54 PM Patient's urinalysis shows some ketones and protein but no evidence of infection with no nitrites or leukocytes.  Potassium and calcium are low, will replete both IV and orally with potassium and some orally with a calcium.  BNP is elevated at 321 similar to prior.  COVID and  flu negative.  TSH normal.  Chest x-ray did not show pneumonia.  Clinically I am concerned that the patient says that he cannot get  up and walk due to the fatigue in the setting of his likely viral URI and electrolyte abnormalities.  With a potassium of 2 7 and calcium of 60 without the ability to safely ambulate with fatigue I do not feel he is safe to go home.  He did not have any focal weakness or deficits and is not having acute back pain, low suspicion for some acute infection in his spine at this time.  We will call for admission for electrolyte repletion, monitoring, and likely PT/OT to evaluate.         Final Clinical Impression(s) / ED Diagnoses Final diagnoses:  Hypokalemia  Hypocalcemia  Fatigue, unspecified type  Upper respiratory tract infection, unspecified type     Clinical Impression: 1. Hypokalemia   2. Hypocalcemia   3. Fatigue, unspecified type   4. Upper respiratory tract infection, unspecified type     Disposition: Admit  This note was prepared with assistance of Dragon voice recognition software. Occasional wrong-word or sound-a-like substitutions may have occurred due to the inherent limitations of voice recognition software.      Kaydyn Chism, Gwenyth Allegra, MD 06/18/21 [phone]

## 2021-06-18 NOTE — ED Notes (Signed)
K 2.7, Calcium was 6.2, MD Tegeler notified.

## 2021-06-19 ENCOUNTER — Encounter (HOSPITAL_COMMUNITY): Payer: Self-pay | Admitting: Family Medicine

## 2021-06-19 DIAGNOSIS — E876 Hypokalemia: Secondary | ICD-10-CM | POA: Diagnosis present

## 2021-06-19 DIAGNOSIS — R7989 Other specified abnormal findings of blood chemistry: Secondary | ICD-10-CM | POA: Diagnosis present

## 2021-06-19 DIAGNOSIS — R6510 Systemic inflammatory response syndrome (SIRS) of non-infectious origin without acute organ dysfunction: Secondary | ICD-10-CM | POA: Diagnosis not present

## 2021-06-19 LAB — COMPREHENSIVE METABOLIC PANEL
ALT: 65 U/L — ABNORMAL HIGH (ref 0–44)
AST: 135 U/L — ABNORMAL HIGH (ref 15–41)
Albumin: 3.5 g/dL (ref 3.5–5.0)
Alkaline Phosphatase: 64 U/L (ref 38–126)
Anion gap: 6 (ref 5–15)
BUN: 21 mg/dL (ref 8–23)
CO2: 24 mmol/L (ref 22–32)
Calcium: 8.9 mg/dL (ref 8.9–10.3)
Chloride: 104 mmol/L (ref 98–111)
Creatinine, Ser: 0.75 mg/dL (ref 0.61–1.24)
GFR, Estimated: >60 mL/min (ref >60)
Glucose, Bld: 110 mg/dL — ABNORMAL HIGH (ref 70–99)
Potassium: 4 mmol/L (ref 3.5–5.1)
Sodium: 134 mmol/L — ABNORMAL LOW (ref 135–145)
Total Bilirubin: 1.4 mg/dL — ABNORMAL HIGH (ref 0.3–1.2)
Total Protein: 6.4 g/dL — ABNORMAL LOW (ref 6.5–8.1)

## 2021-06-19 LAB — CBC
HCT: 34.2 % — ABNORMAL LOW (ref 39.0–52.0)
Hemoglobin: 12.1 g/dL — ABNORMAL LOW (ref 13.0–17.0)
MCH: 30.6 pg (ref 26.0–34.0)
MCHC: 35.4 g/dL (ref 30.0–36.0)
MCV: 86.4 fL (ref 80.0–100.0)
Platelets: 294 10*3/uL (ref 150–400)
RBC: 3.96 MIL/uL — ABNORMAL LOW (ref 4.22–5.81)
RDW: 12.9 % (ref 11.5–15.5)
WBC: 8.6 10*3/uL (ref 4.0–10.5)
nRBC: 0 % (ref 0.0–0.2)

## 2021-06-19 LAB — MAGNESIUM: Magnesium: 1.9 mg/dL (ref 1.7–2.4)

## 2021-06-19 LAB — PROCALCITONIN
Procalcitonin: 0.97 ng/mL
Procalcitonin: 1.44 ng/mL

## 2021-06-19 LAB — CK: Total CK: 4973 U/L — ABNORMAL HIGH (ref 49–397)

## 2021-06-19 LAB — STREP PNEUMONIAE URINARY ANTIGEN: Strep Pneumo Urinary Antigen: NEGATIVE

## 2021-06-19 MED ORDER — ACETAMINOPHEN 650 MG RE SUPP: 650.0000 mg | Freq: Four times a day (QID) | RECTAL | Status: DC | PRN

## 2021-06-19 MED ORDER — SODIUM CHLORIDE 0.9 % IV SOLN
500.0000 mg | INTRAVENOUS | Status: DC
  Administered 2021-06-19 – 2021-06-20 (×2): 500 mg via INTRAVENOUS
  Filled 2021-06-19 (×2): qty 5

## 2021-06-19 MED ORDER — SENNOSIDES-DOCUSATE SODIUM 8.6-50 MG PO TABS: 1.0000 | ORAL_TABLET | Freq: Every evening | ORAL | Status: DC | PRN

## 2021-06-19 MED ORDER — MAGNESIUM SULFATE 2 GM/50ML IV SOLN
2.0000 g | Freq: Once | INTRAVENOUS | Status: AC
  Administered 2021-06-19: 2 g via INTRAVENOUS
  Filled 2021-06-19: qty 50

## 2021-06-19 MED ORDER — SODIUM CHLORIDE 0.9 % IV SOLN
2.0000 g | INTRAVENOUS | Status: DC
  Administered 2021-06-19 – 2021-06-20 (×2): 2 g via INTRAVENOUS
  Filled 2021-06-19 (×2): qty 20

## 2021-06-19 MED ORDER — LATANOPROST 0.005 % OP SOLN
1.0000 [drp] | Freq: Every day | OPHTHALMIC | Status: DC
  Administered 2021-06-19 – 2021-06-22 (×4): 1 [drp] via OPHTHALMIC
  Filled 2021-06-19: qty 2.5

## 2021-06-19 MED ORDER — ENOXAPARIN SODIUM 40 MG/0.4ML IJ SOSY
40.0000 mg | PREFILLED_SYRINGE | INTRAMUSCULAR | Status: DC
  Administered 2021-06-19 – 2021-06-23 (×5): 40 mg via SUBCUTANEOUS
  Filled 2021-06-19 (×5): qty 0.4

## 2021-06-19 MED ORDER — SODIUM CHLORIDE 0.9% FLUSH
3.0000 mL | Freq: Two times a day (BID) | INTRAVENOUS | Status: DC
  Administered 2021-06-19 – 2021-06-23 (×10): 3 mL via INTRAVENOUS

## 2021-06-19 MED ORDER — ACETAMINOPHEN 325 MG PO TABS
650.0000 mg | ORAL_TABLET | Freq: Four times a day (QID) | ORAL | Status: DC | PRN
  Administered 2021-06-19: 650 mg via ORAL
  Filled 2021-06-19: qty 2

## 2021-06-19 NOTE — ED Notes (Signed)
Pt's SPO2 between 86-89% while sleeping. Patient placed on 2 L O2 via [place], O2 sats improved to 94%

## 2021-06-19 NOTE — H&P (Signed)
History and Physical    Jeffrey Porter ALP:[id number] DOB: July 23, 1931 DOA: 06/18/2021  PCP: [name], MD   Patient coming from: Home   Chief Complaint: Weakness, fever, cough   HPI: Jeffrey Porter is a pleasant 88 y.o. male with medical history significant for prostate cancer, vascular parkinsonism, and hepatic steatosis, now presenting to the emergency department with general weakness, fevers, and cough.  Patient reports 4 or 5 days of productive cough with worsening general weakness and fatigue, and fever and chills.  He does not feel particularly short of breath and denies any chest pain.  He denies abdominal pain, nausea, vomiting, or diarrhea.  He denies any new rash or wound.  He normally ambulates with a walker but was too weak to even stand today.  He lives with one of his daughters.   ED Course: Upon arrival to the ED, patient is found to be febrile, saturating mid to upper 90s on room air, mildly tachypneic and tachycardic, and with systolic blood pressure 353 and greater.  EKG features sinus tachycardia with nonspecific IVCD with LAD.  Chest x-ray negative for acute cardiopulmonary disease.  Chemistry panel notable for potassium 2.7, calcium 6.2, albumin 2.7, AST 82, and total bilirubin 1.7.  COVID and influenza PCR were negative.  Blood and urine cultures were collected and the patient was treated with Tums and potassium.  Review of Systems:  All other systems reviewed and apart from HPI, are negative.  Past Medical History:  Diagnosis Date   Baker's cyst of knee, left    Glaucoma    Hypertension    Neuromuscular disorder ([place])    peripheral neuropathy   Prostate cancer [facility])     Past Surgical History:  Procedure Laterality Date   BALLOON DILATION N/A 08/03/2019   Procedure: BALLOON DILATION;  Surgeon: Alexis Frock, MD;  Location: [facility];  Service: Urology;  Laterality: N/A;   CATARACT EXTRACTION     CYSTOSCOPY WITH RETROGRADE URETHROGRAM N/A 08/03/2019   Procedure:  CYSTOSCOPY WITH RETROGRADE URETHROGRAM;  Surgeon: Alexis Frock, MD;  Location: [facility];  Service: Urology;  Laterality: N/A;   HERNIA REPAIR     INSERTION BRACHYTHERAPY DEVICE      Social History:   reports that he has quit smoking. His smoking use included cigarettes. He has never used smokeless tobacco. He reports that he does not currently use alcohol after a past usage of about 1.0 standard drink per week. He reports that he does not use drugs.  Allergies  Allergen Reactions   Timolol Maleate Other (See Comments)    Syncope     Family History  Problem Relation Age of Onset   Cancer Mother    Cancer Father      Prior to Admission medications   Medication Sig Start Date End Date Taking? Authorizing Provider  acetaminophen (TYLENOL) 500 MG tablet Take 500 mg by mouth daily as needed for mild pain or headache.   Yes [provider]  BIOTIN PO Take 1 tablet by mouth daily.   Yes [provider]  Cholecalciferol (VITAMIN D3 SUPER STRENGTH) 50 MCG (2000 UT) CAPS Take 2,000 Units by mouth daily.   Yes [provider]  Cyanocobalamin (VITAMIN B-12 PO) Take 1 tablet by mouth daily.   Yes [provider]  latanoprost (XALATAN) 0.005 % ophthalmic solution Place 1 drop into both eyes at bedtime.    Yes [provider]  Magnesium 250 MG TABS Take 250 mg by mouth daily.   Yes [provider]  Multiple Vitamins-Minerals (CENTRUM SILVER 50+MEN) TABS Take 1 tablet by mouth daily with breakfast.   Yes [provider]  niacin 100 MG tablet Take 100 mg by mouth at bedtime.   Yes [provider]  zinc gluconate 50 MG tablet Take 50 mg by mouth daily.   Yes [provider]  Carbidopa-Levodopa ER (SINEMET CR) 25-100 MG tablet controlled release TAKE 1 TABLET BY MOUTH THREE TIMES A DAY Patient not taking: Reported on 06/18/2021 03/13/18   Tat, Eustace Quail, DO  cephALEXin (KEFLEX) 500 MG capsule Take 1 capsule (500 mg total)  by mouth 2 (two) times daily. X 3 days. Begin day before next Urology appointment. Patient not taking: Reported on 06/18/2021 08/03/19   Alexis Frock, MD    Physical Exam: Vitals:   06/19/21 0000 06/19/21 0030 06/19/21 0100 06/19/21 0130  BP: (!) 125/57 119/64 (!) 120/57 (!) 121/51  Pulse: (!) 101 86 92 93  Resp: (!) 22 (!) 22 (!) 22 (!) 25  Temp:      TempSrc:      SpO2: 94% 96% 100% 95%  Weight:      Height:        Constitutional: NAD, calm, listless   Eyes: PERTLA, lids and conjunctivae normal ENMT: Mucous membranes are moist. Posterior pharynx clear of any exudate or lesions.   Neck: supple, no masses  Respiratory: no wheezing, no crackles. No accessory muscle use.  Cardiovascular: S1 & S2 heard, regular rate and rhythm. Bilateral lower leg swelling.    Abdomen: no tenderness, soft. Bowel sounds active.  Musculoskeletal: no clubbing / cyanosis. No joint deformity upper and lower extremities.   Skin: no significant rashes, lesions, ulcers. Warm, dry, well-perfused. Neurologic: CN 2-12 grossly intact. Moving all extremities. Alert and oriented.  Psychiatric: Pleasant. Cooperative.    Labs and Imaging on Admission: I have personally reviewed following labs and imaging studies  CBC: Recent Labs  Lab 06/18/21 1733  WBC 9.3  NEUTROABS 7.9*  HGB 11.4*  HCT 33.6*  MCV 88.4  PLT 409   Basic Metabolic Panel: Recent Labs  Lab 06/18/21 1733  NA 137  K 2.7*  CL 113*  CO2 20*  GLUCOSE 88  BUN 19  CREATININE 0.61  CALCIUM 6.2*  MG 1.6*   GFR: Estimated Creatinine Clearance: 52.4 mL/min (by C-G formula based on SCr of 0.61 mg/dL). Liver Function Tests: Recent Labs  Lab 06/18/21 1733  AST 82*  ALT 44  ALKPHOS 47  BILITOT 1.7*  PROT 4.7*  ALBUMIN 2.7*   No results for input(s): LIPASE, AMYLASE in the last 168 hours. No results for input(s): AMMONIA in the last 168 hours. Coagulation Profile: No results for input(s): INR, PROTIME in the last 168  hours. Cardiac Enzymes: No results for input(s): CKTOTAL, CKMB, CKMBINDEX, TROPONINI in the last 168 hours. BNP (last 3 results) No results for input(s): PROBNP in the last 8760 hours. HbA1C: No results for input(s): HGBA1C in the last 72 hours. CBG: No results for input(s): GLUCAP in the last 168 hours. Lipid Profile: No results for input(s): CHOL, HDL, LDLCALC, TRIG, CHOLHDL, LDLDIRECT in the last 72 hours. Thyroid Function Tests: Recent Labs    06/18/21 2017  TSH 1.116   Anemia Panel: No results for input(s): VITAMINB12, FOLATE, FERRITIN, TIBC, IRON, RETICCTPCT in the last 72 hours. Urine analysis:    Component Value Date/Time   COLORURINE AMBER (A) 06/18/2021 1900   APPEARANCEUR CLEAR 06/18/2021 1900   LABSPEC 1.019 06/18/2021 1900  PHURINE 5.0 06/18/2021 1900   GLUCOSEU NEGATIVE 06/18/2021 1900   HGBUR MODERATE (A) 06/18/2021 1900   BILIRUBINUR NEGATIVE 06/18/2021 1900   KETONESUR 20 (A) 06/18/2021 1900   PROTEINUR 30 (A) 06/18/2021 1900   NITRITE NEGATIVE 06/18/2021 1900   LEUKOCYTESUR NEGATIVE 06/18/2021 1900   Sepsis Labs: @LABRCNTIP (procalcitonin:4,lacticidven:4) ) Recent Results (from the past 240 hour(s))  Resp Panel by RT-PCR (Flu A&B, Covid) Nasopharyngeal Swab     Status: None   Collection Time: 06/18/21  8:16 PM   Specimen: Nasopharyngeal Swab; Nasopharyngeal(NP) swabs in vial transport medium  Result Value Ref Range Status   SARS Coronavirus 2 by RT PCR NEGATIVE NEGATIVE Final    Comment: (NOTE) SARS-CoV-2 target nucleic acids are NOT DETECTED.  The SARS-CoV-2 RNA is generally detectable in upper respiratory specimens during the acute phase of infection. The lowest concentration of SARS-CoV-2 viral copies this assay can detect is 138 copies/mL. A negative result does not preclude SARS-Cov-2 infection and should not be used as the sole basis for treatment or other patient management decisions. A negative result may occur with  improper specimen  collection/handling, submission of specimen other than nasopharyngeal swab, presence of viral mutation(s) within the areas targeted by this assay, and inadequate number of viral copies(<138 copies/mL). A negative result must be combined with clinical observations, patient history, and epidemiological information. The expected result is Negative.  Fact Sheet for Patients:  EntrepreneurPulse.com.au  Fact Sheet for Healthcare Providers:  IncredibleEmployment.be  This test is no t yet approved or cleared by the [place] FDA and  has been authorized for detection and/or diagnosis of SARS-CoV-2 by FDA under an Emergency Use Authorization (EUA). This EUA will remain  in effect (meaning this test can be used) for the duration of the COVID-19 declaration under Section 564(b)(1) of the Act, 21 U.S.C.section 360bbb-3(b)(1), unless the authorization is terminated  or revoked sooner.       Influenza A by PCR NEGATIVE NEGATIVE Final   Influenza B by PCR NEGATIVE NEGATIVE Final    Comment: (NOTE) The Xpert Xpress SARS-CoV-2/FLU/RSV plus assay is intended as an aid in the diagnosis of influenza from Nasopharyngeal swab specimens and should not be used as a sole basis for treatment. Nasal washings and aspirates are unacceptable for Xpert Xpress SARS-CoV-2/FLU/RSV testing.  Fact Sheet for Patients: EntrepreneurPulse.com.au  Fact Sheet for Healthcare Providers: IncredibleEmployment.be  This test is not yet approved or cleared by the [place] FDA and has been authorized for detection and/or diagnosis of SARS-CoV-2 by FDA under an Emergency Use Authorization (EUA). This EUA will remain in effect (meaning this test can be used) for the duration of the COVID-19 declaration under Section 564(b)(1) of the Act, 21 U.S.C. section 360bbb-3(b)(1), unless the authorization is terminated or revoked.  Performed at [facility], [place] [address] [place], [place] [address]      Radiological Exams on Admission: DG Chest 2 View  Result Date: 06/18/2021 CLINICAL DATA:  Productive cough EXAM: CHEST - 2 VIEW COMPARISON:  None. FINDINGS: Heart size upper normal. Vascularity normal. Lungs are clear without infiltrate or effusion. Interval clearing of bilateral airspace disease seen in 2019. IMPRESSION: No active cardiopulmonary disease. Electronically Signed   By: Franchot Gallo M.D.   On: 06/18/2021 18:03    EKG: Independently reviewed. Sinus tachycardia, rate 105, non-specific IVCD with LAD.   Assessment/Plan   1. SIRS, ?pneumonia  - Presents with general weakness, fever, and cough - Blood and urine cultures collected in ED  -  There is no conspicuous pneumonia on CXR but with fever, tachycardia, tachypnea, cough, and elevated procalcitonin, plan to start Rocephin and azithromycin while trending procalcitonin and monitoring cultures and clinical course   2. Hypokalemia; hypomagnesemia; hypocalcemia  - Replace electrolytes and repeat chem panel in am    3. General weakness  - Pt typically uses a walker but has been too generally weak to to ambulate at all for a few days and too weak to stand today  - Likely d/t acute febrile illness and electrolyte derangements   - Replace electrolytes and treat possible bacterial infection as above, consult PT    4. Elevated LFTs  - Mild elevations in AST and total bilirubin noted on admission  - No abdominal pain; workup for elevated LFTs in 2019 included negative viral hepatitis panel and imaging with hepatic steatosis  - Trend    DVT prophylaxis: Lovenox  Code Status: DNR, discussed with patient and daughter in ED  Level of Care: Level of care: Telemetry Family Communication: Daughter at bedside  Disposition Plan:  Patient is from: Home  Anticipated d/c is to: TBD Anticipated d/c date is: Possibly as early as 2/24 or 06/20/21  Patient currently:  pending PT eval, electrolyte replacement, cultures  Consults called: None  Admission status: Observation     Vianne Bulls, MD [facility]  06/19/2021, 1:56 AM

## 2021-06-19 NOTE — ED Notes (Signed)
Patient repositioned and provided with a breakfast tray.

## 2021-06-19 NOTE — ED Notes (Signed)
Condom catheter came off and patient urinated over bed. Peri-care performed, new gown, linen, brief and pad applied as well as a new condom cath. Patient repositioned in bed.

## 2021-06-19 NOTE — Evaluation (Signed)
Physical Therapy Evaluation Patient Details Name: Brysun Eschmann MRN: [id number] DOB: 1932-01-18 Today's Date: 06/19/2021  History of Present Illness  Jereme Loren is a pleasant 88 y.o. male who presented to the ED with general weakness, fevers, and cough and reports that he slid onto the floor the morning of admission and could not get up. Pt denies injuries. In ED pt febrile, saturating mid to upper 90s on room air, mildly tachypneic and tachycardic. PMH significant for prostate cancer, vascular parkinsonism, glaucoma, HTN, and hepatic stentosis.    Clinical Impression  Pinchas Reither is 88 y.o. male admitted with above HPI and diagnosis. Patient is currently limited by functional impairments below (see PT problem list). Patient lives with his daughter and reports he was independent at baseline prior to onset of weakness. He is greatly limited by poor motor planning and decreased ability to initiate movements and may benefit from neurology assessment. Patient will benefit from continued skilled PT interventions to address impairments and progress independence with mobility, recommending [facility] rehab at this time, pending progress may update recommendations if pt has assistance at home. Acute PT will follow and progress as able.        Recommendations for follow up therapy are one component of a multi-disciplinary discharge planning process, led by the attending physician.  Recommendations may be updated based on patient status, additional functional criteria and insurance authorization.  PT Recommendation   Recommendations for Other Services OT consult Filed 06/19/2021 1100  Follow Up Recommendations Skilled nursing-short term rehab (<3 hours/day) Filed 06/19/2021 1100  Assistance recommended at discharge Frequent or constant Supervision/Assistance Filed 06/19/2021 1100  Patient can return home with the following Two people to help with walking and/or transfers, Two people to help with  bathing/dressing/bathroom, Assistance with cooking/housework, Direct supervision/assist for medications management, Help with stairs or ramp for entrance, Assist for transportation Filed 06/19/2021 1100  Functional Status Assessment Patient has had a recent decline in their functional status and demonstrates the ability to make significant improvements in function in a reasonable and predictable amount of time. Filed 06/19/2021 1100  PT equipment None recommended by PT Filed 06/19/2021 1100     06/19/21 1100  PT Visit Information  Last PT Received On 06/19/21  Assistance Needed +1  History of Present Illness Niyam Bisping is a pleasant 88 y.o. male who presented to the ED with general weakness, fevers, and cough and reports that he slid onto the floor the morning of admission and could not get up. Pt denies injuries. In ED pt febrile, saturating mid to upper 90s on room air, mildly tachypneic and tachycardic. PMH significant for prostate cancer, vascular parkinsonism, glaucoma, HTN, and hepatic stentosis.  Precautions  Precautions Fall  Restrictions  Weight Bearing Restrictions No  Home Living  Family/patient expects to be discharged to: Private residence  Living Arrangements Children  Available Help at Discharge Family  Type of [place] to enter  Entrance Stairs-Number of Steps 1  [place] One level  [place] (2 wheels);Cane - single point  Additional Comments He normally ambulates with a walker but was too weak to even stand today.  He lives with one of his daughters.  Prior Function  Prior Level of Function  Needs assist  Communication  Communication No difficulties  Pain Assessment  Pain Assessment 0-10  Cognition  Arousal/Alertness Awake/alert  Behavior During Therapy Flat affect  Overall Cognitive Status No family/caregiver present to determine baseline cognitive functioning  Area of Impairment  Attention;Memory;Following commands;Safety/judgement;Problem solving;Awareness  Current Attention Level Selective  Memory Decreased short-term memory;Decreased recall of precautions  Following Commands Follows one step commands inconsistently;Follows one step commands with increased time  Safety/Judgement Decreased awareness of deficits  Awareness Emergent  Problem Solving Slow processing;Decreased initiation;Difficulty sequencing;Requires verbal cues;Requires tactile cues  General Comments pt with flat affect and taking extra time to process cues. pt with significant impairment in ability to initiate movements  Upper Extremity Assessment  Upper Extremity Assessment Defer to OT evaluation;Generalized weakness (Lt UE slightly weaker than Rt. with shoulder strength.)  Lower Extremity Assessment  Lower Extremity Assessment Generalized weakness;RLE deficits/detail;LLE deficits/detail  RLE Deficits / Details pt with slightly impaired coordiation with heel to shin testing, slow movement and limited ROM bil. hip flexion 3/5 for SLR, ankle dorsi/plantar flexion 4/5.  RLE Sensation history of peripheral neuropathy;decreased proprioception  RLE Coordination decreased gross motor  LLE Deficits / Details pt with slightly impaired coordiation with heel to shin testing, slow movement and limited ROM bil. hip flexion 3/5 for SLR, ankle dorsi/plantar flexion 4/5.  LLE Sensation history of peripheral neuropathy;decreased proprioception  LLE Coordination decreased gross motor  Cervical / Trunk Assessment  Cervical / Trunk Assessment Normal  Bed Mobility  Overal bed mobility Needs Assistance  Bed Mobility Supine to Sit;Sit to Supine  Supine to sit Mod assist;Max assist;+2 for safety/equipment;HOB elevated  Sit to supine Mod assist;Max assist;+2 for physical assistance;+2 for safety/equipment  General bed mobility comments multimodal cues for sequencing supine<>sit, Mod assist to bring LE's off EOB and  initiate roll of trunk, Max assist to fully raise trunk. Max+2 to return to supine and boost in bed.  Transfers  Overall transfer level Needs assistance  Equipment used Rolling walker (2 wheels)  Transfers Sit to/from Stand  Sit to Stand Mod assist;+2 physical assistance;+2 safety/equipment;From elevated surface  General transfer comment Min-Mod +2 for power up from EOB with 2 for safety. pt with posterior lean on bed to stabilize.  Ambulation/Gait  Ambulation/Gait assistance Mod assist;+2 safety/equipment;+2 physical assistance  Gait Distance (Feet) 3 Feet  Assistive device Rolling walker (2 wheels)  Pre-gait activities attempted side steps at EOB and pt with significant difficulty initiating steps with freezing pattern. manual assist required to contralateral weight shift and to step each leg laterally.  Balance  Overall balance assessment Needs assistance  Sitting-balance support Feet supported;Bilateral upper extremity supported  Sitting balance-Leahy Scale Fair  Standing balance support During functional activity;Reliant on assistive device for balance;Bilateral upper extremity supported  Standing balance-Leahy Scale Poor  PT - End of Session  Equipment Utilized During Treatment Gait belt  Activity Tolerance Patient tolerated treatment well  Patient left in bed;with call bell/phone within reach  Nurse Communication Mobility status  PT Assessment  PT Recommendation/Assessment Patient needs continued PT services  PT Visit Diagnosis Muscle weakness (generalized) (M62.81);Difficulty in walking, not elsewhere classified (R26.2);Other symptoms and signs involving the nervous system (R29.898);Unsteadiness on feet (R26.81)  PT Problem List Decreased strength;Decreased activity tolerance;Decreased balance;Decreased mobility;Decreased coordination;Decreased cognition;Decreased knowledge of use of DME;Decreased safety awareness;Decreased knowledge of precautions  PT Plan  PT Frequency (ACUTE  ONLY) Min 3X/week  PT Treatment/Interventions (ACUTE ONLY) DME instruction;Gait training;Stair training;Functional mobility training;Therapeutic activities;Therapeutic exercise;Balance training;Neuromuscular re-education;Cognitive remediation;Patient/family education  AM-PAC PT "6 Clicks" Mobility Outcome Measure (Version 2)  Help needed turning from your back to your side while in a flat bed without using bedrails? 2  Help needed moving from lying on your back to sitting on the side of  a flat bed without using bedrails? 1  Help needed moving to and from a bed to a chair (including a wheelchair)? 2  Help needed standing up from a chair using your arms (e.g., wheelchair or bedside chair)? 2  Help needed to walk in hospital room? 1  Help needed climbing 3-5 steps with a railing?  1  6 Click Score 9  Consider Recommendation of Discharge To: CIR/SNF/LTACH  Progressive Mobility  What is the highest level of mobility based on the progressive mobility assessment? Level 3 (Stands with assist) - Balance while standing  and cannot march in place  Activity Stood at bedside  PT Recommendation  Recommendations for Other Services OT consult  Follow Up Recommendations Skilled nursing-short term rehab (<3 hours/day)  Assistance recommended at discharge Frequent or constant Supervision/Assistance  Patient can return home with the following Two people to help with walking and/or transfers;Two people to help with bathing/dressing/bathroom;Assistance with cooking/housework;Direct supervision/assist for medications management;Help with stairs or ramp for entrance;Assist for transportation  Functional Status Assessment Patient has had a recent decline in their functional status and demonstrates the ability to make significant improvements in function in a reasonable and predictable amount of time.  PT equipment None recommended by PT  Individuals Consulted  Consulted and Agree with Results and Recommendations  Patient  Acute Rehab PT Goals  Patient Stated Goal be able to walk again  PT Goal Formulation With patient  Time For Goal Achievement 07/03/21  Potential to Achieve Goals Good  PT Time Calculation  PT Start Time (ACUTE ONLY) 1108  PT Stop Time (ACUTE ONLY) 1130  PT Time Calculation (min) (ACUTE ONLY) 22 min  PT General Charges  $$ ACUTE PT VISIT 1 Visit  PT Evaluation  $PT Eval Moderate Complexity 1 Mod  Written Expression  Dominant Hand Right    Verner Mould, DPT Acute Rehabilitation Services Office [phone] Pager [phone]    Jacques Navy 06/19/2021, 4:00 PM

## 2021-06-19 NOTE — Progress Notes (Signed)
PROGRESS NOTE  Jeffrey Porter LKG:[id number] DOB: 07/13/1931 DOA: 06/18/2021 PCP: [name], MD  Brief History   Jeffrey Porter is a pleasant 88 y.o. male with medical history significant for prostate cancer, vascular parkinsonism, and hepatic steatosis, now presenting to the emergency department with general weakness, fevers, and cough.  Patient reports 4 or 5 days of productive cough with worsening general weakness and fatigue, and fever and chills.  He does not feel particularly short of breath and denies any chest pain.  He denies abdominal pain, nausea, vomiting, or diarrhea.  He denies any new rash or wound.  He normally ambulates with a walker but was too weak to even stand today.  He lives with one of his daughters.    ED Course: Upon arrival to the ED, patient is found to be febrile, saturating mid to upper 90s on room air, mildly tachypneic and tachycardic, and with systolic blood pressure 664 and greater.  EKG features sinus tachycardia with nonspecific IVCD with LAD.  Chest x-ray negative for acute cardiopulmonary disease.  Chemistry panel notable for potassium 2.7, calcium 6.2, albumin 2.7, AST 82, and total bilirubin 1.7.  COVID and influenza PCR were negative.  Blood and urine cultures were collected and the patient was treated with Tums and potassium.  [facility] were consulted to admit the patient for further evaluation and care.  Consultants  None  Procedures  None  Antibiotics   Anti-infectives (From admission, onward)    Start     Dose/Rate Route Frequency Ordered Stop   06/19/21 0200  cefTRIAXone (ROCEPHIN) 2 g in sodium chloride 0.9 % 100 mL IVPB        2 g 200 mL/hr over 30 Minutes Intravenous Every 24 hours 06/19/21 0155 06/24/21 0159   06/19/21 0200  azithromycin (ZITHROMAX) 500 mg in sodium chloride 0.9 % 250 mL IVPB        500 mg 250 mL/hr over 60 Minutes Intravenous Every 24 hours 06/19/21 0155 06/24/21 0159      Subjective  The patient is resting  comfortably. No new complaints.  Objective   Vitals:  Vitals:   06/19/21 1417 06/19/21 1447  BP: (!) 142/72 130/63  Pulse: (!) 103 92  Resp: 20 20  Temp:  99.5 F (37.5 C)  SpO2: 95% 94%    Exam:  Constitutional:  The patient is awake, alert, and somewhat lethargic No acute distress. Respiratory:  No increased work of breathing. No wheezes, rales, or rhonchi No tactile fremitus Cardiovascular:  Regular rate and rhythm No murmurs, ectopy, or gallups. No lateral PMI. No thrills. Abdomen:  Abdomen is soft, non-tender, non-distended No hernias, masses, or organomegaly Normoactive bowel sounds.  Musculoskeletal:  No cyanosis, clubbing, or edema Skin:  No rashes, lesions, ulcers palpation of skin: no induration or nodules Neurologic:  CN 2-12 intact Sensation all 4 extremities intact Psychiatric:  Mental status Mood, affect appropriate Orientation to person, place, time  judgment and insight appear intact  I have personally reviewed the following:   Today's Data  Vitals  Lab Data  CMP CBC  Micro Data  Blood culture x 2 have had no growth Urine culture no growth  Imaging  CXR  Cardiology Data  EKG  Other Data    Scheduled Meds:  enoxaparin (LOVENOX) injection  40 mg Subcutaneous Q24H   latanoprost  1 drop Both Eyes QHS   sodium chloride flush  3 mL Intravenous Q12H   Continuous Infusions:  azithromycin Stopped (06/19/21 0423)   cefTRIAXone (ROCEPHIN)  IV Stopped (06/19/21 0335)    Principal Problem:   SIRS (systemic inflammatory response syndrome) (HCC) Active Problems:   Hypokalemia   Hypocalcemia   Elevated LFTs   LOS: 0 days   A & P  1. SIRS, ?pneumonia  - Presents with general weakness, fever, and cough - Blood and urine cultures collected in ED  - There is no conspicuous pneumonia on CXR but with fever, tachycardia, tachypnea, cough, and elevated procalcitonin, plan to start Rocephin and azithromycin while trending procalcitonin  and monitoring cultures and clinical course    2. Hypokalemia; hypomagnesemia; hypocalcemia  - Replace electrolytes and repeat chem panel in am     3. General weakness  - Pt typically uses a walker but has been too generally weak to to ambulate at all for a few days and too weak to stand today  - Likely d/t acute febrile illness and electrolyte derangements   - Replace electrolytes and treat possible bacterial infection as above, consult PT     4. Elevated LFTs  - Mild elevations in AST and total bilirubin noted on admission  - No abdominal pain; workup for elevated LFTs in 2019 included negative viral hepatitis panel and imaging with hepatic steatosis  - Trend      DVT prophylaxis: Lovenox  Code Status: DNR, discussed with patient and daughter in ED  Level of Care: Level of care: Telemetry Family Communication: Daughter at bedside  Disposition Plan:  Patient is from: Home  Anticipated d/c is to: TBD Anticipated d/c date is: Possibly as early as 2/24 or 06/20/21  Patient currently: pending PT eval, electrolyte replacement, cultures   Tonye Tancredi, DO [facility] Direct contact: see [email or web address]  7PM-7AM contact night coverage as above 06/19/2021, 5:27 PM  LOS: 0 days

## 2021-06-20 DIAGNOSIS — K760 Fatty (change of) liver, not elsewhere classified: Secondary | ICD-10-CM | POA: Diagnosis present

## 2021-06-20 DIAGNOSIS — J189 Pneumonia, unspecified organism: Secondary | ICD-10-CM | POA: Diagnosis present

## 2021-06-20 DIAGNOSIS — G214 Vascular parkinsonism: Secondary | ICD-10-CM | POA: Diagnosis present

## 2021-06-20 DIAGNOSIS — Z66 Do not resuscitate: Secondary | ICD-10-CM | POA: Diagnosis present

## 2021-06-20 DIAGNOSIS — Z87891 Personal history of nicotine dependence: Secondary | ICD-10-CM | POA: Diagnosis not present

## 2021-06-20 DIAGNOSIS — Z8546 Personal history of malignant neoplasm of prostate: Secondary | ICD-10-CM | POA: Diagnosis not present

## 2021-06-20 DIAGNOSIS — R6510 Systemic inflammatory response syndrome (SIRS) of non-infectious origin without acute organ dysfunction: Secondary | ICD-10-CM | POA: Diagnosis not present

## 2021-06-20 DIAGNOSIS — R5381 Other malaise: Secondary | ICD-10-CM | POA: Diagnosis present

## 2021-06-20 DIAGNOSIS — I10 Essential (primary) hypertension: Secondary | ICD-10-CM | POA: Diagnosis present

## 2021-06-20 DIAGNOSIS — Z79899 Other long term (current) drug therapy: Secondary | ICD-10-CM | POA: Diagnosis not present

## 2021-06-20 DIAGNOSIS — Z20822 Contact with and (suspected) exposure to covid-19: Secondary | ICD-10-CM | POA: Diagnosis present

## 2021-06-20 DIAGNOSIS — H409 Unspecified glaucoma: Secondary | ICD-10-CM | POA: Diagnosis present

## 2021-06-20 DIAGNOSIS — Z888 Allergy status to other drugs, medicaments and biological substances status: Secondary | ICD-10-CM | POA: Diagnosis not present

## 2021-06-20 DIAGNOSIS — R7989 Other specified abnormal findings of blood chemistry: Secondary | ICD-10-CM | POA: Diagnosis present

## 2021-06-20 DIAGNOSIS — E876 Hypokalemia: Secondary | ICD-10-CM | POA: Diagnosis present

## 2021-06-20 LAB — CBC WITH DIFFERENTIAL/PLATELET
Abs Immature Granulocytes: 0.04 10*3/uL (ref 0.00–0.07)
Basophils Absolute: 0 10*3/uL (ref 0.0–0.1)
Basophils Relative: 0 %
Eosinophils Absolute: 0.3 10*3/uL (ref 0.0–0.5)
Eosinophils Relative: 3 %
HCT: 37 % — ABNORMAL LOW (ref 39.0–52.0)
Hemoglobin: 12.7 g/dL — ABNORMAL LOW (ref 13.0–17.0)
Immature Granulocytes: 0 %
Lymphocytes Relative: 12 %
Lymphs Abs: 1.2 10*3/uL (ref 0.7–4.0)
MCH: 30.1 pg (ref 26.0–34.0)
MCHC: 34.3 g/dL (ref 30.0–36.0)
MCV: 87.7 fL (ref 80.0–100.0)
Monocytes Absolute: 0.7 10*3/uL (ref 0.1–1.0)
Monocytes Relative: 8 %
Neutro Abs: 7.3 10*3/uL (ref 1.7–7.7)
Neutrophils Relative %: 77 %
Platelets: 303 10*3/uL (ref 150–400)
RBC: 4.22 MIL/uL (ref 4.22–5.81)
RDW: 12.9 % (ref 11.5–15.5)
WBC: 9.5 10*3/uL (ref 4.0–10.5)
nRBC: 0 % (ref 0.0–0.2)

## 2021-06-20 LAB — COMPREHENSIVE METABOLIC PANEL
ALT: 70 U/L — ABNORMAL HIGH (ref 0–44)
AST: 101 U/L — ABNORMAL HIGH (ref 15–41)
Albumin: 3.4 g/dL — ABNORMAL LOW (ref 3.5–5.0)
Alkaline Phosphatase: 64 U/L (ref 38–126)
Anion gap: 7 (ref 5–15)
BUN: 17 mg/dL (ref 8–23)
CO2: 24 mmol/L (ref 22–32)
Calcium: 8.5 mg/dL — ABNORMAL LOW (ref 8.9–10.3)
Chloride: 101 mmol/L (ref 98–111)
Creatinine, Ser: 0.69 mg/dL (ref 0.61–1.24)
GFR, Estimated: >60 mL/min (ref >60)
Glucose, Bld: 145 mg/dL — ABNORMAL HIGH (ref 70–99)
Potassium: 3.5 mmol/L (ref 3.5–5.1)
Sodium: 132 mmol/L — ABNORMAL LOW (ref 135–145)
Total Bilirubin: 0.4 mg/dL (ref 0.3–1.2)
Total Protein: 6.1 g/dL — ABNORMAL LOW (ref 6.5–8.1)

## 2021-06-20 LAB — URINE CULTURE: Culture: <10000 — AB

## 2021-06-20 LAB — CK: Total CK: 1884 U/L — ABNORMAL HIGH (ref 49–397)

## 2021-06-20 LAB — PROCALCITONIN: Procalcitonin: 0.8 ng/mL

## 2021-06-20 MED ORDER — AZITHROMYCIN 250 MG PO TABS
500.0000 mg | ORAL_TABLET | Freq: Every day | ORAL | Status: AC
  Administered 2021-06-21 – 2021-06-23 (×3): 500 mg via ORAL
  Filled 2021-06-20 (×3): qty 2

## 2021-06-20 MED ORDER — AMOXICILLIN-POT CLAVULANATE 875-125 MG PO TABS
1.0000 | ORAL_TABLET | Freq: Two times a day (BID) | ORAL | Status: DC
  Administered 2021-06-20 – 2021-06-23 (×6): 1 via ORAL
  Filled 2021-06-20 (×6): qty 1

## 2021-06-20 NOTE — Progress Notes (Signed)
Physical Therapy Treatment Patient Details Name: Jeffrey Porter MRN: [id number] DOB: Sep 07, 1931 Today's Date: 06/20/2021   History of Present Illness Jeffrey Porter is a pleasant 88 y.o. male who presented to the ED with general weakness, fevers, and cough and reports that he slid onto the floor the morning of admission and could not get up. Pt denies injuries. In ED pt febrile, saturating mid to upper 90s on room air, mildly tachypneic and tachycardic. PMH significant for prostate cancer, vascular parkinsonism, glaucoma, HTN, and hepatic stentosis.    PT Comments    Pt motivated to progress mobility today. Pt requires step-by-step, MAX verbal cuing throughout mobility for sequencing tasks, appears to have deficits in motor planning suspect secondary to parkinsonism. Pt ambulated short room distance with mod physical assist and close chair follow, needs significant cuing and truncal support to maintain upright. PT continuing to recommend SNF level of care post-acutely.     Recommendations for follow up therapy are one component of a multi-disciplinary discharge planning process, led by the attending physician.  Recommendations may be updated based on patient status, additional functional criteria and insurance authorization.  Follow Up Recommendations  Skilled nursing-short term rehab (<3 hours/day)     Assistance Recommended at Discharge Frequent or constant Supervision/Assistance  Patient can return home with the following Assistance with cooking/housework;Direct supervision/assist for medications management;Help with stairs or ramp for entrance;Assist for transportation;A lot of help with walking and/or transfers;A lot of help with bathing/dressing/bathroom   Equipment Recommendations  None recommended by PT    Recommendations for Other Services OT consult     Precautions / Restrictions Precautions Precautions: Fall Restrictions Weight Bearing Restrictions: No     Mobility  Bed  Mobility Overal bed mobility: Needs Assistance Bed Mobility: Supine to Sit     Supine to sit: Mod assist, HOB elevated     General bed mobility comments: assist for LE progression to EOB, trunk elevation via R HHA, and scooting to EOB with assist of bed pad. Increased time, max sequencing cues.    Transfers Overall transfer level: Needs assistance Equipment used: Rolling walker (2 wheels) Transfers: Sit to/from Stand Sit to Stand: From elevated surface, Mod assist           General transfer comment: assist for power up, rise, steadying, correcting heavy posterior bias.    Ambulation/Gait Ambulation/Gait assistance: Mod assist, +2 safety/equipment Gait Distance (Feet): 10 Feet Assistive device: Rolling walker (2 wheels) Gait Pattern/deviations: Step-through pattern, Decreased stride length, Trunk flexed Gait velocity: decr     General Gait Details: assist for correcting posterior bias throughout gait, steadying, providing truncal support especially during weight shifting in preparation to step. MAX sequencing verbal cues needed for gait   Stairs             Wheelchair Mobility    Modified Rankin (Stroke Patients Only)       Balance Overall balance assessment: Needs assistance Sitting-balance support: Feet supported, Bilateral upper extremity supported Sitting balance-Leahy Scale: Fair     Standing balance support: During functional activity, Reliant on assistive device for balance, Bilateral upper extremity supported Standing balance-Leahy Scale: Poor                              Cognition Arousal/Alertness: Awake/alert Behavior During Therapy: Flat affect Overall Cognitive Status: No family/caregiver present to determine baseline cognitive functioning Area of Impairment: Attention, Memory, Following commands, Safety/judgement  Current Attention Level: Sustained Memory: Decreased short-term memory Following  Commands: Follows one step commands consistently Safety/Judgement: Decreased awareness of deficits, Decreased awareness of safety Awareness: Intellectual Problem Solving: Slow processing, Decreased initiation, Difficulty sequencing, Requires verbal cues, Requires tactile cues General Comments: pt requires step-by-step cuing for all mobility, and increased processing time. During gait, pt needs "step right, step left, step right" cues to continue to progress forward.        Exercises General Exercises - Lower Extremity Ankle Circles/Pumps: AROM, Both, 10 reps, Seated Long Arc Quad: AAROM, Both, 10 reps, Seated (cues for slow and controlled)    General Comments        Pertinent Vitals/Pain Pain Assessment Pain Assessment: Faces Faces Pain Scale: Hurts a little bit Pain Location: bilat calves Pain Descriptors / Indicators: Cramping, Sore Pain Intervention(s): Limited activity within patient's tolerance, Monitored during session, Repositioned    Home Living                          Prior Function            PT Goals (current goals can now be found in the care plan section) Acute Rehab PT Goals Patient Stated Goal: be able to walk again PT Goal Formulation: With patient Time For Goal Achievement: 07/03/21 Potential to Achieve Goals: Good Progress towards PT goals: Progressing toward goals    Frequency    Min 3X/week      PT Plan Current plan remains appropriate    Co-evaluation              AM-PAC PT "6 Clicks" Mobility   Outcome Measure  Help needed turning from your back to your side while in a flat bed without using bedrails?: A Lot Help needed moving from lying on your back to sitting on the side of a flat bed without using bedrails?: A Lot Help needed moving to and from a bed to a chair (including a wheelchair)?: A Lot Help needed standing up from a chair using your arms (e.g., wheelchair or bedside chair)?: A Lot Help needed to walk in  hospital room?: A Lot Help needed climbing 3-5 steps with a railing? : Total 6 Click Score: 11    End of Session   Activity Tolerance: Patient tolerated treatment well Patient left: in bed;with call bell/phone within reach Nurse Communication: Mobility status PT Visit Diagnosis: Muscle weakness (generalized) (M62.81);Difficulty in walking, not elsewhere classified (R26.2);Other symptoms and signs involving the nervous system (R29.898);Unsteadiness on feet (R26.81)     Time: 1110-1130 PT Time Calculation (min) (ACUTE ONLY): 20 min  Charges:  $Therapeutic Activity: 8-22 mins                     Stacie Glaze, PT DPT Acute Rehabilitation Services Pager [phone]  Office [phone]    Roxine Caddy E Ruffin Pyo 06/20/2021, 11:53 AM

## 2021-06-20 NOTE — Progress Notes (Signed)
PHARMACIST - PHYSICIAN COMMUNICATION DR:   Benny Lennert CONCERNING: Antibiotic IV to Oral Route Change Policy  RECOMMENDATION: This patient is receiving Azithromycin by the intravenous route.  Based on criteria approved by the Pharmacy and Therapeutics Committee, the antibiotic(s) is/are being converted to the equivalent oral dose form(s).   DESCRIPTION: These criteria include: Patient being treated for a respiratory tract infection, urinary tract infection, cellulitis or clostridium difficile associated diarrhea if on metronidazole The patient is not neutropenic and does not exhibit a GI malabsorption state The patient is eating (either orally or via tube) and/or has been taking other orally administered medications for a least 24 hours The patient is improving clinically and has a Tmax < 100.5  If you have questions about this conversion, please contact the Pharmacy Department  []   [phone] )  [facility] []   [phone] )  [facility] []   [phone] )  [facility] []   [phone] )  [facility] [x]   [phone] )  [facility]

## 2021-06-20 NOTE — Assessment & Plan Note (Signed)
Resolved. Monitor.  

## 2021-06-20 NOTE — Assessment & Plan Note (Signed)
Resolved. Continue to monitor and supplement as necessary.

## 2021-06-20 NOTE — Progress Notes (Signed)
PROGRESS NOTE  Jeffrey Porter:[id number] DOB: Apr 28, 1931 DOA: 06/18/2021 PCP: [name], MD  Brief History   Jeffrey Porter is a pleasant 88 y.o. male with medical history significant for prostate cancer, vascular parkinsonism, and hepatic steatosis, now presenting to the emergency department with general weakness, fevers, and cough.  Patient reports 4 or 5 days of productive cough with worsening general weakness and fatigue, and fever and chills.  He does not feel particularly short of breath and denies any chest pain.  He denies abdominal pain, nausea, vomiting, or diarrhea.  He denies any new rash or wound.  He normally ambulates with a walker but was too weak to even stand today.  He lives with one of his daughters.    ED Course: Upon arrival to the ED, patient is found to be febrile, saturating mid to upper 90s on room air, mildly tachypneic and tachycardic, and with systolic blood pressure 811 and greater.  EKG features sinus tachycardia with nonspecific IVCD with LAD.  Chest x-ray negative for acute cardiopulmonary disease.  Chemistry panel notable for potassium 2.7, calcium 6.2, albumin 2.7, AST 82, and total bilirubin 1.7.  COVID and influenza PCR were negative.  Blood and urine cultures were collected and the patient was treated with Tums and potassium.  [facility] were consulted to admit the patient for further evaluation and care.  Consultants  None  Procedures  None  Antibiotics   Anti-infectives (From admission, onward)    Start     Dose/Rate Route Frequency Ordered Stop   06/21/21 1000  azithromycin (ZITHROMAX) tablet 500 mg        500 mg Oral Daily 06/20/21 1351 06/24/21 0959   06/19/21 0200  cefTRIAXone (ROCEPHIN) 2 g in sodium chloride 0.9 % 100 mL IVPB        2 g 200 mL/hr over 30 Minutes Intravenous Every 24 hours 06/19/21 0155 06/24/21 0159   06/19/21 0200  azithromycin (ZITHROMAX) 500 mg in sodium chloride 0.9 % 250 mL IVPB  Status:  Discontinued        500  mg 250 mL/hr over 60 Minutes Intravenous Every 24 hours 06/19/21 0155 06/20/21 1351      Subjective  The patient is resting comfortably. No new complaints.  Objective   Vitals:  Vitals:   06/20/21 0033 06/20/21 0448  BP: (!) 125/59 (!) 123/55  Pulse: 93 82  Resp: (!) 22 20  Temp: 98.8 F (37.1 C) 98.8 F (37.1 C)  SpO2: 93% 95%    Exam:  Constitutional:  The patient is awake, alert, and somewhat lethargic No acute distress. Respiratory:  No increased work of breathing. No wheezes, rales, or rhonchi No tactile fremitus Cardiovascular:  Regular rate and rhythm No murmurs, ectopy, or gallups. No lateral PMI. No thrills. Abdomen:  Abdomen is soft, non-tender, non-distended No hernias, masses, or organomegaly Normoactive bowel sounds.  Musculoskeletal:  No cyanosis, clubbing, or edema Skin:  No rashes, lesions, ulcers palpation of skin: no induration or nodules Neurologic:  CN 2-12 intact Sensation all 4 extremities intact Psychiatric:  Mental status Mood, affect appropriate Orientation to person, place, time  judgment and insight appear intact  I have personally reviewed the following:   Today's Data  Vitals  Lab Data  CMP CBC  Micro Data  Blood culture x 2 have had no growth Urine culture no growth  Imaging  CXR  Cardiology Data  EKG  Other Data    Scheduled Meds:  [START ON 06/21/2021] azithromycin  500 mg  Oral Daily   enoxaparin (LOVENOX) injection  40 mg Subcutaneous Q24H   latanoprost  1 drop Both Eyes QHS   sodium chloride flush  3 mL Intravenous Q12H   Continuous Infusions:  cefTRIAXone (ROCEPHIN)  IV 2 g (06/20/21 0149)    Principal Problem:   SIRS (systemic inflammatory response syndrome) (HCC) Active Problems:   Hypokalemia   Hypocalcemia   Elevated LFTs   CAP (community acquired pneumonia)   Debility   LOS: 0 days   A & P  Assessment and Plan: * SIRS (systemic inflammatory response syndrome) ([facility])- (present on  admission) Resolved.  Debility The patient complains that he is too weak to walk and that he doesn't feel safe going home. He has been evaluated by PT/OT. They have recommended SNF at discharge. TOC has been consulted.   CAP (community acquired pneumonia)- (present on admission) This patient presented with fever, cough, tachycardia, tachypnea, and elevated procalcitonin. He was started on IV rocephin and azithromycin. His respiratory status has returned to baseline. Will transition to oral antibiotics.  Elevated LFTs- (present on admission) Improving daily. Likely due to SIRS. Monitor.  Hypocalcemia- (present on admission) Resolved. Monitor.  Hypokalemia- (present on admission) Resolved. Continue to monitor and supplement as necessary.   I have seen and examined this patient myself. I have spent 32 minutes in his evaluation and care.   DVT prophylaxis: Lovenox  Code Status: DNR, discussed with patient and daughter in ED  Level of Care: Level of care: Telemetry Family Communication: Daughter at bedside  Disposition Plan:  Patient is from: Home  Anticipated d/c is to: SNF Anticipated d/c date is: Pending SNF placement  Jeffrey Blane, DO [facility] Direct contact: see [email or web address]  7PM-7AM contact night coverage as above 06/20/2021, 3:49 PM  LOS: 0 days

## 2021-06-20 NOTE — Evaluation (Signed)
Occupational Therapy Evaluation Patient Details Name: Jeffrey Porter MRN: [id number] DOB: 1932/03/27 Today's Date: 06/20/2021   History of Present Illness Jeffrey Porter is a pleasant 88 y.o. male who presented to the ED with general weakness, fevers, and cough and reports that he slid onto the floor the morning of admission and could not get up. Pt denies injuries. In ED pt febrile, saturating mid to upper 90s on room air, mildly tachypneic and tachycardic. PMH significant for prostate cancer, vascular parkinsonism, glaucoma, HTN, and hepatic stentosis.   Clinical Impression   Mr. [name] is an 88 year old man who presents with decreased activity tolerance, impaired balance, motor planning difficulty and reports of bilateral calf pain resulting in a sudden decline in functional abilities. Patient reports last week he was able to ambulate to bathroom with rolator, mostly dress himself, and shower himself. Today he needs max assistance to transfer to side of bed, assistance to maintain sitting and standing balance, and perform all ADLs. He exhibits functional coordination of hands and arms as well as functional strength. He exhibits difficulty coordinating his lower extremities and required max assist to stand. He needed step by step verbal cues to side step to head of bed. He was able to physically perform the movement after cue but not perform. He required mod assist for weight shifting and sustaining upright balance due to posterior lean. He reports fatigue and wants to return to bed as it would take him 30 minutes to walk to chair and back to edge of bed. Once in supine he was able to lift and scoot his bottom to the left in bed. Patient will benefit from skilled OT services while in hospital to improve deficits and learn compensatory strategies as needed in order to return to PLOF.  Due to patient's reported higher level of function and potential to have good support at home will recommend AIR at  discharge.      Recommendations for follow up therapy are one component of a multi-disciplinary discharge planning process, led by the attending physician.  Recommendations may be updated based on patient status, additional functional criteria and insurance authorization.   Follow Up Recommendations  Acute inpatient rehab (3hours/day)    Assistance Recommended at Discharge Frequent or constant Supervision/Assistance  Patient can return home with the following A lot of help with walking and/or transfers;A lot of help with bathing/dressing/bathroom;Help with stairs or ramp for entrance;Assistance with cooking/housework;Direct supervision/assist for medications management;Direct supervision/assist for financial management    Functional Status Assessment  Patient has had a recent decline in their functional status and demonstrates the ability to make significant improvements in function in a reasonable and predictable amount of time.  Equipment Recommendations  None recommended by OT    Recommendations for Other Services Rehab consult     Precautions / Restrictions Precautions Precautions: Fall Restrictions Weight Bearing Restrictions: No      Mobility Bed Mobility                    Transfers                          Balance Overall balance assessment: Needs assistance Sitting-balance support: No upper extremity supported, Feet supported Sitting balance-Leahy Scale: Poor Sitting balance - Comments: posterior lean - falling backward. Intermitent assistance to maintain balance Postural control: Posterior lean Standing balance support: Bilateral upper extremity supported, Reliant on assistive device for balance Standing balance-Leahy Scale: Poor Standing balance  comment: reliant on walker and therapist                           ADL either performed or assessed with clinical judgement   ADL Overall ADL's : Needs assistance/impaired Eating/Feeding:  Set up;Sitting   Grooming: Set up;Sitting   Upper Body Bathing: Moderate assistance;Set up;Sitting   Lower Body Bathing: Maximal assistance;Bed level;Sitting/lateral leans   Upper Body Dressing : Moderate assistance;Sitting   Lower Body Dressing: Maximal assistance;Sit to/from stand Lower Body Dressing Details (indicate cue type and reason): able to bend over and pull up socks, unabel to reach far enough to don socks. Toilet Transfer: Moderate assistance;BSC/3in1;Rolling walker (2 wheels) Toilet Transfer Details (indicate cue type and reason): step by step cues to side step, mod assist to maintain standing balance and assist with walker Toileting- Clothing Manipulation and Hygiene: Maximal assistance;Sit to/from stand       Functional mobility during ADLs: Moderate assistance;Rolling walker (2 wheels) General ADL Comments: Max assist to power up from bed height and mod assit to maitnain standing balance with walker. Able to take side steps to head of bed with mod assist and verbal cues to take each step.     Vision Patient Visual Report: No change from baseline       Perception     Praxis      Pertinent Vitals/Pain Pain Assessment Pain Assessment: Faces Faces Pain Scale: Hurts a little bit Pain Location: bilat calves Pain Descriptors / Indicators: Cramping, Sore Pain Intervention(s): Limited activity within patient's tolerance     Hand Dominance Right   Extremity/Trunk Assessment Upper Extremity Assessment Upper Extremity Assessment: RUE deficits/detail;LUE deficits/detail RUE Deficits / Details: WFL ROM, 4/5shoulder strength, 5/5 elbow strength, 5/5 wrist strength, 5/5 grip strength RUE Coordination: decreased gross motor (able to perform finger to thumb, bradykinesia with finger to nose) LUE Deficits / Details: WFL ROM, 4/5shoulder strength, 5/5 elbow strength, 5/5 wrist strength, 5/5 grip strength LUE Coordination: decreased gross motor (able to perform finger to  thumb, bradykinesia with finger to nose)   Lower Extremity Assessment Lower Extremity Assessment: Defer to PT evaluation   Cervical / Trunk Assessment Cervical / Trunk Assessment: Normal   Communication Communication Communication: No difficulties   Cognition Arousal/Alertness: Awake/alert Behavior During Therapy: WFL for tasks assessed/performed Overall Cognitive Status: Within Functional Limits for tasks assessed                                       General Comments       Exercises     Shoulder Instructions      Home Living Family/patient expects to be discharged to:: Private residence Living Arrangements: Children Available Help at Discharge: Family Type of Home: House Home Access: Stairs to enter Technical brewer of Steps: 1 Entrance Stairs-Rails: None Home Layout: One level     Bathroom Shower/Tub: Walk-in Psychologist, prison and probation services: Handicapped height     Home Equipment: Conservation officer, nature (2 wheels);Kasandra Knudsen - single point   Additional Comments: He lives with one of his daughters. He reports she started working from home a week ago - unsure if this is a short term solution      Prior Functioning/Environment Prior Level of Function : Needs assist             Mobility Comments: uses a walker ADLs Comments: Mostly independent - reports  his grandson puts on his socks and he uses slip on shoes. Reports he showered independently        OT Problem List: Decreased activity tolerance;Impaired balance (sitting and/or standing);Decreased knowledge of use of DME or AE;Pain      OT Treatment/Interventions: Self-care/ADL training;Therapeutic exercise;Neuromuscular education;DME and/or AE instruction;Therapeutic activities;Balance training;Patient/family education    OT Goals(Current goals can be found in the care plan section) Acute Rehab OT Goals Patient Stated Goal: to be independent again OT Goal Formulation: With patient Time For Goal  Achievement: 07/04/21 Potential to Achieve Goals: Good  OT Frequency: Min 2X/week    Co-evaluation              AM-PAC OT "6 Clicks" Daily Activity     Outcome Measure Help from another person eating meals?: A Little Help from another person taking care of personal grooming?: A Little Help from another person toileting, which includes using toliet, bedpan, or urinal?: A Lot Help from another person bathing (including washing, rinsing, drying)?: A Lot Help from another person to put on and taking off regular upper body clothing?: A Lot Help from another person to put on and taking off regular lower body clothing?: A Lot 6 Click Score: 14   End of Session Equipment Utilized During Treatment: Rolling walker (2 wheels) Nurse Communication: Mobility status  Activity Tolerance: Patient limited by fatigue Patient left: in bed;with call bell/phone within reach;with bed alarm set  OT Visit Diagnosis: Other abnormalities of gait and mobility (R26.89)                Time: 8921-1941 OT Time Calculation (min): 31 min Charges:  OT General Charges $OT Visit: 1 Visit OT Evaluation $OT Eval Moderate Complexity: 1 Mod  Ashawn Rinehart, OTR/L [place]  Office [phone] Pager: [phone]   Lenward Chancellor 06/20/2021, 3:03 PM

## 2021-06-20 NOTE — Assessment & Plan Note (Signed)
The patient complains that he is too weak to walk and that he doesn't feel safe going home. He has been evaluated by PT/OT. They have recommended SNF at discharge. TOC has been consulted.

## 2021-06-20 NOTE — Assessment & Plan Note (Signed)
Resolved

## 2021-06-20 NOTE — Assessment & Plan Note (Signed)
Improving daily. Likely due to SIRS. Monitor.

## 2021-06-20 NOTE — Assessment & Plan Note (Addendum)
This patient presented with fever, cough, tachycardia, tachypnea, and elevated procalcitonin. He was started on IV rocephin and azithromycin. His respiratory status has returned to baseline. The patient has been transitioned to oral antibiotics.

## 2021-06-21 DIAGNOSIS — J189 Pneumonia, unspecified organism: Principal | ICD-10-CM

## 2021-06-21 DIAGNOSIS — R54 Age-related physical debility: Secondary | ICD-10-CM

## 2021-06-21 DIAGNOSIS — R7401 Elevation of levels of liver transaminase levels: Secondary | ICD-10-CM

## 2021-06-21 DIAGNOSIS — R6510 Systemic inflammatory response syndrome (SIRS) of non-infectious origin without acute organ dysfunction: Secondary | ICD-10-CM | POA: Diagnosis not present

## 2021-06-21 LAB — COMPREHENSIVE METABOLIC PANEL
ALT: 78 U/L — ABNORMAL HIGH (ref 0–44)
AST: 75 U/L — ABNORMAL HIGH (ref 15–41)
Albumin: 3.5 g/dL (ref 3.5–5.0)
Alkaline Phosphatase: 62 U/L (ref 38–126)
Anion gap: 6 (ref 5–15)
BUN: 16 mg/dL (ref 8–23)
CO2: 30 mmol/L (ref 22–32)
Calcium: 9.1 mg/dL (ref 8.9–10.3)
Chloride: 99 mmol/L (ref 98–111)
Creatinine, Ser: 0.85 mg/dL (ref 0.61–1.24)
GFR, Estimated: >60 mL/min (ref >60)
Glucose, Bld: 128 mg/dL — ABNORMAL HIGH (ref 70–99)
Potassium: 3.8 mmol/L (ref 3.5–5.1)
Sodium: 135 mmol/L (ref 135–145)
Total Bilirubin: 0.5 mg/dL (ref 0.3–1.2)
Total Protein: 6.6 g/dL (ref 6.5–8.1)

## 2021-06-21 LAB — CK: Total CK: 704 U/L — ABNORMAL HIGH (ref 49–397)

## 2021-06-21 NOTE — Progress Notes (Signed)
PROGRESS NOTE  Jeffrey Porter NUU:[id number] DOB: 05/30/1931 DOA: 06/18/2021 PCP: [name], MD  Brief History   Jeffrey Porter is a pleasant 88 y.o. male with medical history significant for prostate cancer, vascular parkinsonism, and hepatic steatosis, now presenting to the emergency department with general weakness, fevers, and cough.  Patient reports 4 or 5 days of productive cough with worsening general weakness and fatigue, and fever and chills.  He does not feel particularly short of breath and denies any chest pain.  He denies abdominal pain, nausea, vomiting, or diarrhea.  He denies any new rash or wound.  He normally ambulates with a walker but was too weak to even stand today.  He lives with one of his daughters.    ED Course: Upon arrival to the ED, patient is found to be febrile, saturating mid to upper 90s on room air, mildly tachypneic and tachycardic, and with systolic blood pressure 347 and greater.  EKG features sinus tachycardia with nonspecific IVCD with LAD.  Chest x-ray negative for acute cardiopulmonary disease.  Chemistry panel notable for potassium 2.7, calcium 6.2, albumin 2.7, AST 82, and total bilirubin 1.7.  COVID and influenza PCR were negative.  Blood and urine cultures were collected and the patient was treated with Tums and potassium.  [facility] were consulted to admit the patient for further evaluation and care.  Plan is for him to discharge to SNF.  Consultants  None  Procedures  None  Antibiotics   Anti-infectives (From admission, onward)    Start     Dose/Rate Route Frequency Ordered Stop   06/21/21 1000  azithromycin (ZITHROMAX) tablet 500 mg        500 mg Oral Daily 06/20/21 1351 06/24/21 0959   06/20/21 2200  amoxicillin-clavulanate (AUGMENTIN) 875-125 MG per tablet 1 tablet        1 tablet Oral Every 12 hours 06/20/21 1550     06/19/21 0200  cefTRIAXone (ROCEPHIN) 2 g in sodium chloride 0.9 % 100 mL IVPB  Status:  Discontinued        2  g 200 mL/hr over 30 Minutes Intravenous Every 24 hours 06/19/21 0155 06/20/21 1550   06/19/21 0200  azithromycin (ZITHROMAX) 500 mg in sodium chloride 0.9 % 250 mL IVPB  Status:  Discontinued        500 mg 250 mL/hr over 60 Minutes Intravenous Every 24 hours 06/19/21 0155 06/20/21 1351      Subjective  The patient is resting comfortably. No new complaints.  Objective   Vitals:  Vitals:   06/21/21 1200 06/21/21 1216  BP:  139/61  Pulse:    Resp: 20 19  Temp:  97.8 F (36.6 C)  SpO2:  93%    Exam:  Constitutional:  The patient is awake, alert, and oriented x 3.No acute distress. Respiratory:  No increased work of breathing. No wheezes, rales, or rhonchi No tactile fremitus Cardiovascular:  Regular rate and rhythm No murmurs, ectopy, or gallups. No lateral PMI. No thrills. Abdomen:  Abdomen is soft, non-tender, non-distended No hernias, masses, or organomegaly Normoactive bowel sounds.  Musculoskeletal:  No cyanosis, clubbing, or edema Skin:  No rashes, lesions, ulcers palpation of skin: no induration or nodules Neurologic:  CN 2-12 intact Sensation all 4 extremities intact Psychiatric:  Mental status Mood, affect appropriate Orientation to person, place, time  judgment and insight appear intact  I have personally reviewed the following:   Today's Data  Vitals  Lab Data  CMP CBC  Micro Data  Blood culture x 2 have had no growth Urine culture no growth  Imaging  CXR  Cardiology Data  EKG  Other Data    Scheduled Meds:  amoxicillin-clavulanate  1 tablet Oral Q12H   azithromycin  500 mg Oral Daily   enoxaparin (LOVENOX) injection  40 mg Subcutaneous Q24H   latanoprost  1 drop Both Eyes QHS   sodium chloride flush  3 mL Intravenous Q12H    Principal Problem:   SIRS (systemic inflammatory response syndrome) (HCC) Active Problems:   Hypokalemia   Hypocalcemia   Elevated LFTs   CAP (community acquired pneumonia)   Debility    Pneumonia   LOS: 1 day   A & P  Assessment and Plan: * SIRS (systemic inflammatory response syndrome) ([place])- (present on admission) Resolved.  Debility The patient complains that he is too weak to walk and that he doesn't feel safe going home. He has been evaluated by PT/OT. They have recommended SNF at discharge. TOC has been consulted.   CAP (community acquired pneumonia)- (present on admission) This patient presented with fever, cough, tachycardia, tachypnea, and elevated procalcitonin. He was started on IV rocephin and azithromycin. His respiratory status has returned to baseline. Will transition to oral antibiotics.  Elevated LFTs- (present on admission) Improving daily. Likely due to SIRS. Monitor.  Hypocalcemia- (present on admission) Resolved. Monitor.  Hypokalemia- (present on admission) Resolved. Continue to monitor and supplement as necessary.   I have seen and examined this patient myself. I have spent 30 minutes in his evaluation and care.   DVT prophylaxis: Lovenox  Code Status: DNR, discussed with patient and daughter in ED  Level of Care: Level of care: Telemetry Family Communication: Daughter at bedside  Disposition Plan:  Patient is from: Home  Anticipated d/c is to: SNF Anticipated d/c date is: Pending SNF placement  Shaleta Ruacho, DO [facility] Direct contact: see [email or web address]  7PM-7AM contact night coverage as above 06/21/2021, 3:24 PM  LOS: 0 days

## 2021-06-21 NOTE — NC FL2 (Signed)
[place] LEVEL OF CARE SCREENING TOOL     IDENTIFICATION  Patient Name: Jeffrey Porter Birthdate: 10/06/1931 Sex: male Admission Date (Current Location): 06/18/2021  [facility] and [place] Number:  Herbalist and Address:  [facility],  [address] [place], [place]      Provider Number: [id number]  Attending Physician Name and Address:  Karie Kirks, DO  Relative Name and Phone Number:  Shawn Route Daughter [phone]  [phone]  [facility] Daughter   [phone]    Current Level of Care: Hospital Recommended Level of Care: [place] Prior Approval Number:    Date Approved/Denied:   PASRR Number: [id number]  Discharge Plan: SNF    Current Diagnoses: Patient Active Problem List   Diagnosis Date Noted   CAP (community acquired pneumonia) 06/20/2021   Debility 06/20/2021   Pneumonia 06/20/2021   Hypokalemia 06/19/2021   Hypocalcemia 06/19/2021   Elevated LFTs 06/19/2021   SIRS (systemic inflammatory response syndrome) ([place]) 06/18/2021   Essential hypertension 12/22/2017   History of prostate cancer 12/22/2017   Glaucoma 12/22/2017   Aortic stenosis 12/22/2017   OA (osteoarthritis) 12/22/2017   Abnormal LFTs 12/22/2017   Thrombocytosis 12/22/2017   Normocytic anemia 12/22/2017   Hyponatremia 12/22/2017   Cellulitis and abscess of left leg 12/21/2017   Left ankle swelling    Acute respiratory failure with hypoxia (HCC)    Paroxysmal supraventricular tachycardia ([place])    Sepsis ([place]) 12/06/2017   Acute lower UTI 12/06/2017   Tachycardia 12/06/2017   Leukocytosis 12/06/2017    Orientation RESPIRATION BLADDER Height & Weight     Self, Time, Situation, Place  Normal Incontinent Weight: 151 lb 14.4 oz (68.9 kg) Height:  5\' 3"  (160 cm)  BEHAVIORAL SYMPTOMS/MOOD NEUROLOGICAL BOWEL NUTRITION STATUS      Continent Diet (Regular)  AMBULATORY STATUS COMMUNICATION OF NEEDS Skin   Limited Assist Verbally  Other (Comment) (MASD on buttocks)                       Personal Care Assistance Level of Assistance  Bathing, Feeding Bathing Assistance: Limited assistance Feeding assistance: Independent       Functional Limitations Info  Sight, Speech, Hearing Sight Info: Adequate Hearing Info: Adequate Speech Info: Adequate    SPECIAL CARE FACTORS FREQUENCY  OT (By licensed OT), PT (By licensed PT)     PT Frequency: Minimum 5x a week OT Frequency: Minimum 5x a week            Contractures Contractures Info: Not present    Additional Factors Info  Code Status, Allergies Code Status Info: DNR Allergies Info: Timolol Maleate           Current Medications (06/21/2021):  This is the current hospital active medication list Current Facility-Administered Medications  Medication Dose Route Frequency Provider Last Rate Last Admin   acetaminophen (TYLENOL) tablet 650 mg  650 mg Oral Q6H PRN Opyd, Ilene Qua, MD   650 mg at 06/19/21 0250   Or   acetaminophen (TYLENOL) suppository 650 mg  650 mg Rectal Q6H PRN Opyd, Ilene Qua, MD       amoxicillin-clavulanate (AUGMENTIN) 875-125 MG per tablet 1 tablet  1 tablet Oral Q12H Swayze, Ava, DO   1 tablet at 06/21/21 0951   azithromycin (ZITHROMAX) tablet 500 mg  500 mg Oral Daily Graylin Shiver L, RPH   500 mg at 06/21/21 0951   enoxaparin (LOVENOX) injection 40 mg  40 mg Subcutaneous Q24H Opyd,  Ilene Qua, MD   40 mg at 06/21/21 0952   latanoprost (XALATAN) 0.005 % ophthalmic solution 1 drop  1 drop Both Eyes QHS Opyd, Ilene Qua, MD   1 drop at 06/20/21 2128   senna-docusate (Senokot-S) tablet 1 tablet  1 tablet Oral QHS PRN Opyd, Ilene Qua, MD       sodium chloride flush (NS) 0.9 % injection 3 mL  3 mL Intravenous Q12H Opyd, Ilene Qua, MD   3 mL at 06/21/21 5391     Discharge Medications: Please see discharge summary for a list of discharge medications.  Relevant Imaging Results:  Relevant Lab Results:   Additional Information SSN  [id number]  Ross Ludwig, LCSW

## 2021-06-21 NOTE — TOC Progression Note (Addendum)
Transition of Care [facility]) - Progression Note    Patient Details  Name: Jasen Hartstein MRN: [id number] Date of Birth: 1931-06-13  [facility]) CM/SW Contact  Ross Ludwig, [place] Phone Number: 06/21/2021, 5:30 PM  Clinical Narrative:     CSW attempted to contact patient's daughter to discuss SNF recommendation.  CSW spoke to patient's daughter, and she gave CSW permission to begin bed search in [place].  CSW explained the process and how insurance will have to approve for rehab.  Per patient's daughter, he has been to SNF before and is familiar with the process.  CSW awaiting for bed offers.        Expected Discharge Plan and Services  SNF for short term rehab.                                               Social Determinants of Health (SDOH) Interventions    Readmission Risk Interventions No flowsheet data found.

## 2021-06-22 DIAGNOSIS — R6510 Systemic inflammatory response syndrome (SIRS) of non-infectious origin without acute organ dysfunction: Secondary | ICD-10-CM | POA: Diagnosis not present

## 2021-06-22 LAB — LEGIONELLA PNEUMOPHILA SEROGP 1 UR AG: L. pneumophila Serogp 1 Ur Ag: NEGATIVE

## 2021-06-22 NOTE — Progress Notes (Signed)
Physical Therapy Treatment Patient Details Name: Jeffrey Porter MRN: [id number] DOB: 03-03-1932 Today's Date: 06/22/2021   History of Present Illness Jeffrey Porter is a pleasant 88 y.o. male who presented to the ED with general weakness, fevers, and cough and reports that he slid onto the floor the morning of admission and could not get up. Pt denies injuries. In ED pt febrile, saturating mid to upper 90s on room air, mildly tachypneic and tachycardic. PMH significant for prostate cancer, vascular parkinsonism, glaucoma, HTN, and hepatic stentosis.    PT Comments    Pt reports he just returned to bed from being in recliner today and declined to mobilize again.  Pt was agreeable to perform exercises in supine however.  Pt reports left LE being weaker then right LE.  Pt able to perform active LE movements however poor control observed.  Pt would benefit from d/c to SNF.    Recommendations for follow up therapy are one component of a multi-disciplinary discharge planning process, led by the attending physician.  Recommendations may be updated based on patient status, additional functional criteria and insurance authorization.  Follow Up Recommendations  Skilled nursing-short term rehab (<3 hours/day)     Assistance Recommended at Discharge Frequent or constant Supervision/Assistance  Patient can return home with the following Assistance with cooking/housework;Direct supervision/assist for medications management;Help with stairs or ramp for entrance;Assist for transportation;A lot of help with walking and/or transfers;A lot of help with bathing/dressing/bathroom   Equipment Recommendations  None recommended by PT    Recommendations for Other Services       Precautions / Restrictions Precautions Precautions: Fall     Mobility  Bed Mobility                    Transfers                        Ambulation/Gait                   Stairs              Wheelchair Mobility    Modified Rankin (Stroke Patients Only)       Balance                                            Cognition Arousal/Alertness: Awake/alert Behavior During Therapy: WFL for tasks assessed/performed Overall Cognitive Status: Within Functional Limits for tasks assessed                                          Exercises General Exercises - Lower Extremity Ankle Circles/Pumps: AROM, Both, 10 reps Quad Sets: AROM, Both, 10 reps Heel Slides: AROM, Both, 10 reps Hip ABduction/ADduction: AROM, Both, 10 reps Straight Leg Raises: AROM, Both, 10 reps Low Level/ICU Exercises Stabilized Bridging: AROM, Both, 10 reps    General Comments        Pertinent Vitals/Pain Pain Assessment Pain Assessment: Faces Faces Pain Scale: Hurts a little bit Pain Location: tender, sore all over Pain Descriptors / Indicators: Tender, Sore Pain Intervention(s): Repositioned, Monitored during session    Home Living  Prior Function            PT Goals (current goals can now be found in the care plan section) Progress towards PT goals: Progressing toward goals    Frequency    Min 2X/week      PT Plan Current plan remains appropriate    Co-evaluation              AM-PAC PT "6 Clicks" Mobility   Outcome Measure  Help needed turning from your back to your side while in a flat bed without using bedrails?: A Lot Help needed moving from lying on your back to sitting on the side of a flat bed without using bedrails?: A Lot Help needed moving to and from a bed to a chair (including a wheelchair)?: A Lot Help needed standing up from a chair using your arms (e.g., wheelchair or bedside chair)?: A Lot Help needed to walk in hospital room?: A Lot Help needed climbing 3-5 steps with a railing? : Total 6 Click Score: 11    End of Session   Activity Tolerance: Patient tolerated treatment  well Patient left: in bed;with call bell/phone within reach   PT Visit Diagnosis: Muscle weakness (generalized) (M62.81)     Time: 2103-1281 PT Time Calculation (min) (ACUTE ONLY): 11 min  Jannette Spanner PT, DPT Acute Rehabilitation Services Pager: [phone] Office: [place] 06/22/2021, 2:54 PM

## 2021-06-22 NOTE — Progress Notes (Signed)
PROGRESS NOTE  Li Fragoso IDP:[id number] DOB: Sep 01, 1931 DOA: 06/18/2021 PCP: [name], MD  Brief History   Aramis Weil is a pleasant 88 y.o. male with medical history significant for prostate cancer, vascular parkinsonism, and hepatic steatosis, now presenting to the emergency department with general weakness, fevers, and cough.  Patient reports 4 or 5 days of productive cough with worsening general weakness and fatigue, and fever and chills.  He does not feel particularly short of breath and denies any chest pain.  He denies abdominal pain, nausea, vomiting, or diarrhea.  He denies any new rash or wound.  He normally ambulates with a walker but was too weak to even stand today.  He lives with one of his daughters.    ED Course: Upon arrival to the ED, patient is found to be febrile, saturating mid to upper 90s on room air, mildly tachypneic and tachycardic, and with systolic blood pressure 443 and greater.  EKG features sinus tachycardia with nonspecific IVCD with LAD.  Chest x-ray negative for acute cardiopulmonary disease.  Chemistry panel notable for potassium 2.7, calcium 6.2, albumin 2.7, AST 82, and total bilirubin 1.7.  COVID and influenza PCR were negative.  Blood and urine cultures were collected and the patient was treated with Tums and potassium.  [facility] were consulted to admit the patient for further evaluation and care.  Plan is for him to discharge to SNF.  Awaiting placement.  Consultants  None  Procedures  None  Antibiotics   Anti-infectives (From admission, onward)    Start     Dose/Rate Route Frequency Ordered Stop   06/21/21 1000  azithromycin (ZITHROMAX) tablet 500 mg        500 mg Oral Daily 06/20/21 1351 06/24/21 0959   06/20/21 2200  amoxicillin-clavulanate (AUGMENTIN) 875-125 MG per tablet 1 tablet        1 tablet Oral Every 12 hours 06/20/21 1550     06/19/21 0200  cefTRIAXone (ROCEPHIN) 2 g in sodium chloride 0.9 % 100 mL IVPB  Status:   Discontinued        2 g 200 mL/hr over 30 Minutes Intravenous Every 24 hours 06/19/21 0155 06/20/21 1550   06/19/21 0200  azithromycin (ZITHROMAX) 500 mg in sodium chloride 0.9 % 250 mL IVPB  Status:  Discontinued        500 mg 250 mL/hr over 60 Minutes Intravenous Every 24 hours 06/19/21 0155 06/20/21 1351      Subjective  The patient is resting comfortably. No new complaints.  Objective   Vitals:  Vitals:   06/21/21 2230 06/22/21 0651  BP: 118/65 (!) 130/59  Pulse: 93 84  Resp: 18 17  Temp: 98.1 F (36.7 C) 98 F (36.7 C)  SpO2: 96% 94%    Exam:  Constitutional:  The patient is awake, alert, and oriented x 3. No acute distress. Respiratory:  No increased work of breathing. No wheezes, rales, or rhonchi No tactile fremitus Cardiovascular:  Regular rate and rhythm No murmurs, ectopy, or gallups. No lateral PMI. No thrills. Abdomen:  Abdomen is soft, non-tender, non-distended No hernias, masses, or organomegaly Normoactive bowel sounds.  Musculoskeletal:  No cyanosis, clubbing, or edema Skin:  No rashes, lesions, ulcers palpation of skin: no induration or nodules Neurologic:  CN 2-12 intact Sensation all 4 extremities intact Psychiatric:  Mental status Mood, affect appropriate Orientation to person, place, time  judgment and insight appear intact  I have personally reviewed the following:   Today's Data  Vitals  Lab  Data     Micro Data  Blood culture x 2 have had no growth Urine culture no growth  Imaging  CXR  Cardiology Data  EKG  Other Data    Scheduled Meds:  amoxicillin-clavulanate  1 tablet Oral Q12H   azithromycin  500 mg Oral Daily   enoxaparin (LOVENOX) injection  40 mg Subcutaneous Q24H   latanoprost  1 drop Both Eyes QHS   sodium chloride flush  3 mL Intravenous Q12H    Principal Problem:   SIRS (systemic inflammatory response syndrome) (HCC) Active Problems:   Hypokalemia   Hypocalcemia   Elevated LFTs   CAP  (community acquired pneumonia)   Debility   LOS: 2 days   A & P  Assessment and Plan: * SIRS (systemic inflammatory response syndrome) ([place])- (present on admission) Resolved.  Debility- (present on admission) The patient complains that he is too weak to walk and that he doesn't feel safe going home. He has been evaluated by PT/OT. They have recommended SNF at discharge. TOC has been consulted.   CAP (community acquired pneumonia)- (present on admission) This patient presented with fever, cough, tachycardia, tachypnea, and elevated procalcitonin. He was started on IV rocephin and azithromycin. His respiratory status has returned to baseline. The patient has been transitioned to oral antibiotics.  Elevated LFTs- (present on admission) Improving daily. Likely due to SIRS. Monitor.  Hypocalcemia- (present on admission) Resolved. Monitor.  Hypokalemia- (present on admission) Resolved. Continue to monitor and supplement as necessary.   I have seen and examined this patient myself. I have spent 32 minutes in his evaluation and care.   DVT prophylaxis: Lovenox  Code Status: DNR, discussed with patient and daughter in ED  Level of Care: Level of care: Telemetry Family Communication: Daughter at bedside  Disposition Plan:  Patient is from: Home  Anticipated d/c is to: SNF Anticipated d/c date is: Pending SNF placement  Jesenia Spera, DO [facility] Direct contact: see [email or web address]  7PM-7AM contact night coverage as above 06/22/2021, 3:15 PM  LOS: 0 days

## 2021-06-22 NOTE — Progress Notes (Signed)
Inpatient Rehab Admissions Coordinator:   Per OT recommendation, patient was screened for CIR candidacy by Haris Baack, MS, CCC-SLP. At this time, Pt. does not appear to demonstrate medical necessity to justify in hospital rehabilitation/CIR. will not pursue a rehab consult for this Pt.   Recommend other rehab venues to be pursued.  Please contact me with any questions.  Jeffrey Sedivy, MS, CCC-SLP Rehab Admissions Coordinator  [phone] (celll) [phone] (office)   

## 2021-06-22 NOTE — TOC Initial Note (Addendum)
Transition of Care [facility]) - Initial/Assessment Note    Patient Details  Name: Jeffrey Porter MRN: [id number] Date of Birth: 1931/12/18  Transition of Care [facility]) CM/SW Contact:    Leeroy Cha, RN Phone Number: 06/22/2021, 8:07 AM  Clinical Narrative:                 Will need placement for rehab.  Daughter is the main contact. Tcf-duaghter wouyld like for father to go to [employer] started. Expected Discharge Plan: Skilled Nursing Facility Barriers to Discharge: Continued Medical Work up   Patient Goals and CMS Choice Patient states their goals for this hospitalization and ongoing recovery are:: will get in touch with dtg CMS Medicare.gov Compare Post Acute Care list provided to:: Patient Represenative (must comment) (daughter) Choice offered to / list presented to : Adult Children  Expected Discharge Plan and Services Expected Discharge Plan: [place]   Discharge Planning Services: CM Consult Post Acute Care Choice: [place] Living arrangements for the past 2 months: Single Family Home                                      Prior Living Arrangements/Services Living arrangements for the past 2 months: Single Family Home Lives with:: Self Patient language and need for interpreter reviewed:: Yes        Need for Family Participation in Patient Care: Yes (Comment) (daughter)     Criminal Activity/Legal Involvement Pertinent to Current Situation/Hospitalization: No - Comment as needed  Activities of Daily Living Home Assistive Devices/Equipment: Eyeglasses, Dentures (specify type), Walker (specify type) ADL Screening (condition at time of admission) Patient's cognitive ability adequate to safely complete daily activities?: Yes Is the patient deaf or have difficulty hearing?: No Does the patient have difficulty seeing, even when wearing glasses/contacts?: No Does the patient have difficulty concentrating, remembering,  or making decisions?: No Patient able to express need for assistance with ADLs?: Yes Does the patient have difficulty dressing or bathing?: No Independently performs ADLs?: Yes (appropriate for developmental age) Does the patient have difficulty walking or climbing stairs?: No Weakness of Legs: Both Weakness of Arms/Hands: None  Permission Sought/Granted                  Emotional Assessment Appearance:: Appears stated age     Orientation: : Oriented to Self, Oriented to Place, Oriented to  Time, Oriented to Situation Alcohol / Substance Use: Not Applicable Psych Involvement: No (comment)  Admission diagnosis:  Hypocalcemia [E83.51] Hypokalemia [E87.6] SIRS (systemic inflammatory response syndrome) (HCC) [R65.10] Fatigue, unspecified type [R53.83] Upper respiratory tract infection, unspecified type [J06.9] Pneumonia [J18.9] Patient Active Problem List   Diagnosis Date Noted   CAP (community acquired pneumonia) 06/20/2021   Debility 06/20/2021   Pneumonia 06/20/2021   Hypokalemia 06/19/2021   Hypocalcemia 06/19/2021   Elevated LFTs 06/19/2021   SIRS (systemic inflammatory response syndrome) ([place]) 06/18/2021   Essential hypertension 12/22/2017   History of prostate cancer 12/22/2017   Glaucoma 12/22/2017   Aortic stenosis 12/22/2017   OA (osteoarthritis) 12/22/2017   Abnormal LFTs 12/22/2017   Thrombocytosis 12/22/2017   Normocytic anemia 12/22/2017   Hyponatremia 12/22/2017   Cellulitis and abscess of left leg 12/21/2017   Left ankle swelling    Acute respiratory failure with hypoxia ([place])    Paroxysmal supraventricular tachycardia ([place])    Sepsis ([place]) 12/06/2017   Acute lower UTI 12/06/2017  Tachycardia 12/06/2017   Leukocytosis 12/06/2017   PCP:  [name], MD Pharmacy:   [id number] [place], [address] [place] [address] Phone: [phone] Fax: [phone]     Social Determinants of Health  (SDOH) Interventions    Readmission Risk Interventions No flowsheet data found.

## 2021-06-23 DIAGNOSIS — R5383 Other fatigue: Secondary | ICD-10-CM | POA: Diagnosis not present

## 2021-06-23 DIAGNOSIS — E538 Deficiency of other specified B group vitamins: Secondary | ICD-10-CM | POA: Diagnosis not present

## 2021-06-23 DIAGNOSIS — R0602 Shortness of breath: Secondary | ICD-10-CM | POA: Diagnosis not present

## 2021-06-23 DIAGNOSIS — R7401 Elevation of levels of liver transaminase levels: Secondary | ICD-10-CM | POA: Diagnosis not present

## 2021-06-23 DIAGNOSIS — I10 Essential (primary) hypertension: Secondary | ICD-10-CM | POA: Diagnosis not present

## 2021-06-23 DIAGNOSIS — R54 Age-related physical debility: Secondary | ICD-10-CM | POA: Diagnosis not present

## 2021-06-23 DIAGNOSIS — J189 Pneumonia, unspecified organism: Secondary | ICD-10-CM | POA: Diagnosis not present

## 2021-06-23 DIAGNOSIS — G214 Vascular parkinsonism: Secondary | ICD-10-CM | POA: Diagnosis not present

## 2021-06-23 DIAGNOSIS — W19XXXA Unspecified fall, initial encounter: Secondary | ICD-10-CM | POA: Diagnosis not present

## 2021-06-23 DIAGNOSIS — Z8546 Personal history of malignant neoplasm of prostate: Secondary | ICD-10-CM | POA: Diagnosis not present

## 2021-06-23 DIAGNOSIS — Z8639 Personal history of other endocrine, nutritional and metabolic disease: Secondary | ICD-10-CM | POA: Diagnosis not present

## 2021-06-23 DIAGNOSIS — R531 Weakness: Secondary | ICD-10-CM | POA: Diagnosis not present

## 2021-06-23 DIAGNOSIS — Z7401 Bed confinement status: Secondary | ICD-10-CM | POA: Diagnosis not present

## 2021-06-23 DIAGNOSIS — H409 Unspecified glaucoma: Secondary | ICD-10-CM | POA: Diagnosis not present

## 2021-06-23 DIAGNOSIS — E876 Hypokalemia: Secondary | ICD-10-CM | POA: Diagnosis not present

## 2021-06-23 DIAGNOSIS — M1990 Unspecified osteoarthritis, unspecified site: Secondary | ICD-10-CM | POA: Diagnosis not present

## 2021-06-23 DIAGNOSIS — R6510 Systemic inflammatory response syndrome (SIRS) of non-infectious origin without acute organ dysfunction: Secondary | ICD-10-CM | POA: Diagnosis not present

## 2021-06-23 DIAGNOSIS — K760 Fatty (change of) liver, not elsewhere classified: Secondary | ICD-10-CM | POA: Diagnosis not present

## 2021-06-23 DIAGNOSIS — N2889 Other specified disorders of kidney and ureter: Secondary | ICD-10-CM | POA: Diagnosis not present

## 2021-06-23 DIAGNOSIS — G20 Parkinson's disease: Secondary | ICD-10-CM | POA: Diagnosis not present

## 2021-06-23 DIAGNOSIS — M6281 Muscle weakness (generalized): Secondary | ICD-10-CM | POA: Diagnosis not present

## 2021-06-23 LAB — CBC WITH DIFFERENTIAL/PLATELET
Abs Immature Granulocytes: 0.2 10*3/uL — ABNORMAL HIGH (ref 0.00–0.07)
Basophils Absolute: 0.1 10*3/uL (ref 0.0–0.1)
Basophils Relative: 1 %
Eosinophils Absolute: 0.4 10*3/uL (ref 0.0–0.5)
Eosinophils Relative: 4 %
HCT: 39 % (ref 39.0–52.0)
Hemoglobin: 13.6 g/dL (ref 13.0–17.0)
Immature Granulocytes: 2 %
Lymphocytes Relative: 24 %
Lymphs Abs: 2.2 10*3/uL (ref 0.7–4.0)
MCH: 29.8 pg (ref 26.0–34.0)
MCHC: 34.9 g/dL (ref 30.0–36.0)
MCV: 85.5 fL (ref 80.0–100.0)
Monocytes Absolute: 0.6 10*3/uL (ref 0.1–1.0)
Monocytes Relative: 7 %
Neutro Abs: 5.6 10*3/uL (ref 1.7–7.7)
Neutrophils Relative %: 62 %
Platelets: 406 10*3/uL — ABNORMAL HIGH (ref 150–400)
RBC: 4.56 MIL/uL (ref 4.22–5.81)
RDW: 12.7 % (ref 11.5–15.5)
WBC: 9.1 10*3/uL (ref 4.0–10.5)
nRBC: 0 % (ref 0.0–0.2)

## 2021-06-23 LAB — CULTURE, BLOOD (ROUTINE X 2)
Culture: NO GROWTH
Culture: NO GROWTH
Special Requests: ADEQUATE

## 2021-06-23 LAB — BASIC METABOLIC PANEL
Anion gap: 8 (ref 5–15)
BUN: 20 mg/dL (ref 8–23)
CO2: 25 mmol/L (ref 22–32)
Calcium: 9.2 mg/dL (ref 8.9–10.3)
Chloride: 101 mmol/L (ref 98–111)
Creatinine, Ser: 0.64 mg/dL (ref 0.61–1.24)
GFR, Estimated: >60 mL/min (ref >60)
Glucose, Bld: 111 mg/dL — ABNORMAL HIGH (ref 70–99)
Potassium: 4 mmol/L (ref 3.5–5.1)
Sodium: 134 mmol/L — ABNORMAL LOW (ref 135–145)

## 2021-06-23 MED ORDER — AMOXICILLIN-POT CLAVULANATE 875-125 MG PO TABS: 1.0000 | ORAL_TABLET | Freq: Two times a day (BID) | ORAL | 0 refills | Status: AC

## 2021-06-23 NOTE — TOC Progression Note (Addendum)
Transition of Care [facility]) - Progression Note    Patient Details  Name: Jeffrey Porter MRN: [id number] Date of Birth: 1931-09-29  Transition of Care [facility]) CM/SW Contact  Leeroy Cha, RN Phone Number: 06/23/2021, 9:17 AM  Clinical Narrative:   Approval for admission into [facility] snf obtained . Auth number is [id number] good for 5 days with update on [id number].  Contact person is Hilda Lias at [phone] Tct-peggy baldwin at [facility]/message to please call back. That patient is interested in going and may be ready today. [facility]peggy at [facility] with bed  Tct [facility] pt can go to room105.  Will alert md and floor rn TCT -daughter notified of transfer to snf and room number. Transfer med necessity form printed out. Ptar called for transport. Expected Discharge Plan: [place] Barriers to Discharge: Continued Medical Work up  Expected Discharge Plan and Services Expected Discharge Plan: [place]   Discharge Planning Services: CM Consult Post Acute Care Choice: [place] Living arrangements for the past 2 months: Single Family Home                                       Social Determinants of Health (SDOH) Interventions    Readmission Risk Interventions No flowsheet data found.

## 2021-06-23 NOTE — Progress Notes (Signed)
Jeffrey Porter Ok to be D/C'd Skilled nursing facility per MD order.  Discussed with the patient and all questions fully answered.  VSS, Skin clean, dry and intact without evidence of skin break down, no evidence of skin tears noted. IV catheter discontinued intact. Site without signs and symptoms of complications. Dressing and pressure applied.  An After Visit Summary was printed and given to PTAR.  D/c education completed with patient/family including follow up instructions, medication list, d/c activities limitations if indicated, with other d/c instructions as indicated by MD - patient able to verbalize understanding, all questions fully answered.   Patient instructed to return to ED, call 911, or call MD for any changes in condition.   Patient escorted via stretcher, and D/C to [facility] via non emergency ambulance.  [name] 06/23/2021 4:07 PM

## 2021-06-23 NOTE — Discharge Summary (Signed)
Physician Discharge Summary   Patient: Jeffrey Porter MRN: [id number] DOB: 01-01-1932  Admit date:     06/18/2021  Discharge date: 06/23/21  Discharge Physician: Karie Kirks   PCP: [name], MD   Recommendations at discharge:    Discharge to SNF for rehab Follow up with PCP in 7-10 days after discharge from rehab  Discharge Diagnoses: Principal Problem:   SIRS (systemic inflammatory response syndrome) ([place]) Active Problems:   Hypokalemia   Hypocalcemia   Elevated LFTs   CAP (community acquired pneumonia)   Debility  Resolved Problems:   * No resolved hospital problems. *   Hospital Course: Jeffrey Porter is a pleasant 88 y.o. male with medical history significant for prostate cancer, vascular parkinsonism, and hepatic steatosis, now presenting to the emergency department with general weakness, fevers, and cough.  Patient reports 4 or 5 days of productive cough with worsening general weakness and fatigue, and fever and chills.  He does not feel particularly short of breath and denies any chest pain.  He denies abdominal pain, nausea, vomiting, or diarrhea.  He denies any new rash or wound.  He normally ambulates with a walker but was too weak to even stand today.  He lives with one of his daughters.    ED Course: Upon arrival to the ED, patient is found to be febrile, saturating mid to upper 90s on room air, mildly tachypneic and tachycardic, and with systolic blood pressure 700 and greater.  EKG features sinus tachycardia with nonspecific IVCD with LAD.  Chest x-ray negative for acute cardiopulmonary disease.  Chemistry panel notable for potassium 2.7, calcium 6.2, albumin 2.7, AST 82, and total bilirubin 1.7.  COVID and influenza PCR were negative.  Blood and urine cultures were collected and the patient was treated with Tums and potassium.   The patient was admitted to a telemetry bed. He was treated with IV antibiotics and then was switched to orals. His respiratory status returned to  baseline.  The patient was very weak upon admission and became moreso due to his acute illness. He was evaluated by PT/OT. They recommended SNF.  The patient will be discharged to SNF today in fair condition.  Assessment and Plan: * SIRS (systemic inflammatory response syndrome) ([place])- (present on admission) Resolved.  Debility- (present on admission) The patient complains that he is too weak to walk and that he doesn't feel safe going home. He has been evaluated by PT/OT. They have recommended SNF at discharge. TOC has been consulted.   CAP (community acquired pneumonia)- (present on admission) This patient presented with fever, cough, tachycardia, tachypnea, and elevated procalcitonin. He was started on IV rocephin and azithromycin. His respiratory status has returned to baseline. The patient has been transitioned to oral antibiotics.  Elevated LFTs- (present on admission) Improving daily. Likely due to SIRS. Monitor.  Hypocalcemia- (present on admission) Resolved. Monitor.  Hypokalemia- (present on admission) Resolved. Continue to monitor and supplement as necessary.   Consultants: None Procedures performed: None  Disposition: Skilled nursing facility Diet recommendation:  Discharge Diet Orders (From admission, onward)     Start     Ordered   06/23/21 0000  Diet - low sodium heart healthy        06/23/21 1220           Cardiac diet  DISCHARGE MEDICATION: Allergies as of 06/23/2021       Reactions   Timolol Maleate Other (See Comments)   Syncope        Medication List  STOP taking these medications    cephALEXin 500 MG capsule Commonly known as: Keflex       TAKE these medications    acetaminophen 500 MG tablet Commonly known as: TYLENOL Take 500 mg by mouth daily as needed for mild pain or headache.   amoxicillin-clavulanate 875-125 MG tablet Commonly known as: AUGMENTIN Take 1 tablet by mouth every 12 (twelve) hours for 4 days.   BIOTIN  PO Take 1 tablet by mouth daily.   Carbidopa-Levodopa ER 25-100 MG tablet controlled release Commonly known as: SINEMET CR TAKE 1 TABLET BY MOUTH THREE TIMES A DAY   Centrum Silver 50+Men Tabs Take 1 tablet by mouth daily with breakfast.   latanoprost 0.005 % ophthalmic solution Commonly known as: XALATAN Place 1 drop into both eyes at bedtime.   Magnesium 250 MG Tabs Take 250 mg by mouth daily.   niacin 100 MG tablet Take 100 mg by mouth at bedtime.   VITAMIN B-12 PO Take 1 tablet by mouth daily.   Vitamin D3 Super Strength 50 MCG (2000 UT) Caps Generic drug: Cholecalciferol Take 2,000 Units by mouth daily.   zinc gluconate 50 MG tablet Take 50 mg by mouth daily.         Discharge Exam: Filed Weights   06/18/21 1723 06/19/21 1447  Weight: 63.5 kg 68.9 kg   Vitals:   06/22/21 2053 06/23/21 0355  BP: 140/62 109/66  Pulse: 95 80  Resp: 17 15  Temp: 97.7 F (36.5 C) 98.1 F (36.7 C)  SpO2: 96% 94%   Exam:  Constitutional:  The patient is awake, alert, and oriented x 3. No acute distress. Respiratory:  No increased work of breathing. No wheezes, rales, or rhonchi No tactile fremitus Cardiovascular:  Regular rate and rhythm No murmurs, ectopy, or gallups. No lateral PMI. No thrills. Abdomen:  Abdomen is soft, non-tender, non-distended No hernias, masses, or organomegaly Normoactive bowel sounds.  Musculoskeletal:  No cyanosis, clubbing, or edema Skin:  No rashes, lesions, ulcers palpation of skin: no induration or nodules Neurologic:  CN 2-12 intact Sensation all 4 extremities intact Psychiatric:  Mental status Mood, affect appropriate Orientation to person, place, time  judgment and insight appear intact   Condition at discharge: fair  The results of significant diagnostics from this hospitalization (including imaging, microbiology, ancillary and laboratory) are listed below for reference.   Imaging Studies: DG Chest 2 View  Result  Date: 06/18/2021 CLINICAL DATA:  Productive cough EXAM: CHEST - 2 VIEW COMPARISON:  None. FINDINGS: Heart size upper normal. Vascularity normal. Lungs are clear without infiltrate or effusion. Interval clearing of bilateral airspace disease seen in 2019. IMPRESSION: No active cardiopulmonary disease. Electronically Signed   By: Franchot Gallo M.D.   On: 06/18/2021 18:03    Microbiology: Results for orders placed or performed during the hospital encounter of 06/18/21  Urine Culture     Status: Abnormal   Collection Time: 06/18/21  7:00 PM   Specimen: Urine, Clean Catch  Result Value Ref Range Status   Specimen Description   Final    URINE, CLEAN CATCH Performed at [facility], [place] [address], [place], [place] [address]    Special Requests   Final    NONE Performed at [facility], [place] [address], [place], [place] [address]    Culture (A)  Final    <10,000 COLONIES/mL INSIGNIFICANT GROWTH Performed at [place] [address], [place],  [address]    Report Status  06/20/2021 FINAL  Final  Resp Panel by RT-PCR (Flu A&B, Covid) Nasopharyngeal Swab     Status: None   Collection Time: 06/18/21  8:16 PM   Specimen: Nasopharyngeal Swab; Nasopharyngeal(NP) swabs in vial transport medium  Result Value Ref Range Status   SARS Coronavirus 2 by RT PCR NEGATIVE NEGATIVE Final    Comment: (NOTE) SARS-CoV-2 target nucleic acids are NOT DETECTED.  The SARS-CoV-2 RNA is generally detectable in upper respiratory specimens during the acute phase of infection. The lowest concentration of SARS-CoV-2 viral copies this assay can detect is 138 copies/mL. A negative result does not preclude SARS-Cov-2 infection and should not be used as the sole basis for treatment or other patient management decisions. A negative result may occur with  improper specimen collection/handling, submission of specimen other than nasopharyngeal swab, presence of viral  mutation(s) within the areas targeted by this assay, and inadequate number of viral copies(<138 copies/mL). A negative result must be combined with clinical observations, patient history, and epidemiological information. The expected result is Negative.  Fact Sheet for Patients:  EntrepreneurPulse.com.au  Fact Sheet for Healthcare Providers:  IncredibleEmployment.be  This test is no t yet approved or cleared by the [place] FDA and  has been authorized for detection and/or diagnosis of SARS-CoV-2 by FDA under an Emergency Use Authorization (EUA). This EUA will remain  in effect (meaning this test can be used) for the duration of the COVID-19 declaration under Section 564(b)(1) of the Act, 21 U.S.C.section 360bbb-3(b)(1), unless the authorization is terminated  or revoked sooner.       Influenza A by PCR NEGATIVE NEGATIVE Final   Influenza B by PCR NEGATIVE NEGATIVE Final    Comment: (NOTE) The Xpert Xpress SARS-CoV-2/FLU/RSV plus assay is intended as an aid in the diagnosis of influenza from Nasopharyngeal swab specimens and should not be used as a sole basis for treatment. Nasal washings and aspirates are unacceptable for Xpert Xpress SARS-CoV-2/FLU/RSV testing.  Fact Sheet for Patients: EntrepreneurPulse.com.au  Fact Sheet for Healthcare Providers: IncredibleEmployment.be  This test is not yet approved or cleared by the [place] FDA and has been authorized for detection and/or diagnosis of SARS-CoV-2 by FDA under an Emergency Use Authorization (EUA). This EUA will remain in effect (meaning this test can be used) for the duration of the COVID-19 declaration under Section 564(b)(1) of the Act, 21 U.S.C. section 360bbb-3(b)(1), unless the authorization is terminated or revoked.  Performed at [facility], [place] [address], [place], [place] [address]   Blood culture (routine  x 2)     Status: None   Collection Time: 06/18/21  8:17 PM   Specimen: BLOOD LEFT FOREARM  Result Value Ref Range Status   Specimen Description   Final    BLOOD LEFT FOREARM Performed at [place] [address], [place], [place] [address]    Special Requests   Final    BOTTLES DRAWN AEROBIC AND ANAEROBIC Blood Culture adequate volume Performed at [place] [address] [place], [place] [address]    Culture   Final    NO GROWTH 5 DAYS Performed at [facility], [place] [address] [place], [place] [address]    Report Status 06/23/2021 FINAL  Final  Blood culture (routine x 2)     Status: None   Collection Time: 06/18/21  8:17 PM   Specimen: BLOOD RIGHT HAND  Result Value Ref Range Status   Specimen Description   Final  BLOOD RIGHT HAND Performed at [facility], [place] [address] [place], [place] [address]    Special Requests   Final    BOTTLES DRAWN AEROBIC AND ANAEROBIC Blood Culture results may not be optimal due to an inadequate volume of blood received in culture bottles Performed at [place] [address], [place], [place] [address]    Culture   Final    NO GROWTH 5 DAYS Performed at [facility], [place] [address] [place], [place] [address]    Report Status 06/23/2021 FINAL  Final    Labs: CBC: Recent Labs  Lab 06/18/21 1733 06/19/21 0245 06/20/21 1216 06/23/21 0815  WBC 9.3 8.6 9.5 9.1  NEUTROABS 7.9*  --  7.3 5.6  HGB 11.4* 12.1* 12.7* 13.6  HCT 33.6* 34.2* 37.0* 39.0  MCV 88.4 86.4 87.7 85.5  PLT 269 294 303 595*   Basic Metabolic Panel: Recent Labs  Lab 06/18/21 1733 06/19/21 0245 06/20/21 1216 06/21/21 1114 06/23/21 0815  NA 137 134* 132* 135 134*  K 2.7* 4.0 3.5 3.8 4.0  CL 113* 104 101 99 101  CO2 20* 24 24 30 25   GLUCOSE 88 110* 145* 128* 111*  BUN 19 21 17 16 20   CREATININE 0.61 0.75 0.69 0.85 0.64  CALCIUM 6.2* 8.9 8.5* 9.1 9.2  MG 1.6*  1.9  --   --   --    Liver Function Tests: Recent Labs  Lab 06/18/21 1733 06/19/21 0245 06/20/21 1216 06/21/21 1114  AST 82* 135* 101* 75*  ALT 44 65* 70* 78*  ALKPHOS 47 64 64 62  BILITOT 1.7* 1.4* 0.4 0.5  PROT 4.7* 6.4* 6.1* 6.6  ALBUMIN 2.7* 3.5 3.4* 3.5   CBG: No results for input(s): GLUCAP in the last 168 hours.  Discharge time spent: greater than 30 minutes.  Signed: Karie Kirks, DO [facility] 06/23/2021

## 2021-06-23 NOTE — Progress Notes (Signed)
Occupational Therapy Treatment Patient Details Name: Jeffrey Porter MRN: [id number] DOB: 29-Jul-1931 Today's Date: 06/23/2021   History of present illness Jeffrey Porter is a pleasant 88 y.o. male who presented to the ED with general weakness, fevers, and cough and reports that he slid onto the floor the morning of admission and could not get up. Pt denies injuries. In ED pt febrile, saturating mid to upper 90s on room air, mildly tachypneic and tachycardic. PMH significant for prostate cancer, vascular parkinsonism, glaucoma, HTN, and hepatic stentosis.   OT comments  Patient agreeable to getting to chair for breakfast. Patient able to bridge hips and bring legs to edge of bed "this is an improvement." Despite multimodal cues patient unable to upright trunk without mod A. Strong posterior lean needing cues for weight shifting, able to progress to min G assist sitting and total A to don socks. Patient max A to stand with strong posterior lean and inability to weight shift to advance R LE towards recliner without max A. Patient high fall risk and needing significant assist for OOB mobility. Note AIR declined patient, therefore D/C recommendation updated.    Recommendations for follow up therapy are one component of a multi-disciplinary discharge planning process, led by the attending physician.  Recommendations may be updated based on patient status, additional functional criteria and insurance authorization.    Follow Up Recommendations  Skilled nursing-short term rehab (<3 hours/day)    Assistance Recommended at Discharge Frequent or constant Supervision/Assistance  Patient can return home with the following  A lot of help with walking and/or transfers;A lot of help with bathing/dressing/bathroom;Help with stairs or ramp for entrance;Assistance with cooking/housework;Direct supervision/assist for medications management;Direct supervision/assist for financial management   Equipment Recommendations  None  recommended by OT       Precautions / Restrictions Precautions Precautions: Fall Restrictions Weight Bearing Restrictions: No       Mobility Bed Mobility Overal bed mobility: Needs Assistance Bed Mobility: Supine to Sit     Supine to sit: Mod assist, HOB elevated     General bed mobility comments: Patient able to bring legs to edge of bed and bridge hips towards edge. Difficulty uprighting trunk despite use of bed rails and verbal cues needing mod A.       Balance Overall balance assessment: Needs assistance Sitting-balance support: Bilateral upper extremity supported, Feet supported Sitting balance-Leahy Scale: Poor Sitting balance - Comments: Posterior lean, progresses to min G assist Postural control: Posterior lean Standing balance support: Bilateral upper extremity supported, Reliant on assistive device for balance Standing balance-Leahy Scale: Poor Standing balance comment: reliant on walker and therapist                           ADL either performed or assessed with clinical judgement   ADL Overall ADL's : Needs assistance/impaired                     Lower Body Dressing: Total assistance;Sitting/lateral leans Lower Body Dressing Details (indicate cue type and reason): Patient with poor sitting balance and posterior lean needing max cues for weight shifting and total A to don socks Toilet Transfer: Maximal assistance;Stand-pivot;Cueing for safety;Cueing for sequencing;Rolling walker (2 wheels) Toilet Transfer Details (indicate cue type and reason): Patient having max difficulty trying to advance R LE. Cues to push through arms on walker to offload weight as well as cues to weight shift due to posterior lean. Patient needing max A to pivot  feet and sit into recliner         Functional mobility during ADLs: Maximal assistance;Cueing for safety;Cueing for sequencing;Rolling walker (2 wheels)        Cognition Arousal/Alertness:  Awake/alert Behavior During Therapy: WFL for tasks assessed/performed Overall Cognitive Status: Within Functional Limits for tasks assessed                                                     Pertinent Vitals/ Pain       Pain Assessment Pain Assessment: Faces Faces Pain Scale: Hurts little more Pain Location: LEs with transfer Pain Descriptors / Indicators: Grimacing Pain Intervention(s): Monitored during session         Frequency  Min 2X/week        Progress Toward Goals  OT Goals(current goals can now be found in the care plan section)  Progress towards OT goals: Progressing toward goals  Acute Rehab OT Goals Patient Stated Goal: Eat breakfast OT Goal Formulation: With patient Time For Goal Achievement: 07/04/21 Potential to Achieve Goals: Good ADL Goals Pt Will Perform Lower Body Dressing: with mod assist;sit to/from stand Pt Will Transfer to Toilet: with min guard assist;regular height toilet;grab bars Pt Will Perform Toileting - Clothing Manipulation and hygiene: with min guard assist;sit to/from stand Additional ADL Goal #1: Patient will stand at sink to perform grooming task as evidence of improving balance  Plan Discharge plan needs to be updated       AM-PAC OT "6 Clicks" Daily Activity     Outcome Measure   Help from another person eating meals?: A Little Help from another person taking care of personal grooming?: A Little Help from another person toileting, which includes using toliet, bedpan, or urinal?: A Lot Help from another person bathing (including washing, rinsing, drying)?: A Lot Help from another person to put on and taking off regular upper body clothing?: A Lot Help from another person to put on and taking off regular lower body clothing?: Total 6 Click Score: 13    End of Session Equipment Utilized During Treatment: Rolling walker (2 wheels)  OT Visit Diagnosis: Other abnormalities of gait and mobility (R26.89)    Activity Tolerance Patient tolerated treatment well   Patient Left in chair;with call bell/phone within reach;with chair alarm set   Nurse Communication Mobility status        Time: 1443-1540 OT Time Calculation (min): 17 min  Charges: OT General Charges $OT Visit: 1 Visit OT Treatments $Self Care/Home Management : 8-22 mins  Delbert Phenix OT OT pager: [phone]   Rosemary Holms 06/23/2021, 11:57 AM

## 2021-06-23 NOTE — Care Management Important Message (Signed)
Important Message  Patient Details IM Letter placed in Patients room. Name: Jeffrey Porter MRN: [id number] Date of Birth: 03/22/1932   Medicare Important Message Given:  Yes     Kerin Salen 06/23/2021, 11:00 AM

## 2021-06-23 NOTE — Progress Notes (Signed)
Attempted to call and give report x2 to [place]... Left VM with director of transitional rehab with my callback number.

## 2021-06-26 DIAGNOSIS — K760 Fatty (change of) liver, not elsewhere classified: Secondary | ICD-10-CM | POA: Diagnosis not present

## 2021-06-26 DIAGNOSIS — J189 Pneumonia, unspecified organism: Secondary | ICD-10-CM | POA: Diagnosis not present

## 2021-06-26 DIAGNOSIS — G20 Parkinson's disease: Secondary | ICD-10-CM | POA: Diagnosis not present

## 2021-06-26 DIAGNOSIS — Z8639 Personal history of other endocrine, nutritional and metabolic disease: Secondary | ICD-10-CM | POA: Diagnosis not present

## 2021-06-26 DIAGNOSIS — M6281 Muscle weakness (generalized): Secondary | ICD-10-CM | POA: Diagnosis not present

## 2021-07-27 DIAGNOSIS — Z87440 Personal history of urinary (tract) infections: Secondary | ICD-10-CM | POA: Diagnosis not present

## 2021-07-27 DIAGNOSIS — G214 Vascular parkinsonism: Secondary | ICD-10-CM | POA: Diagnosis not present

## 2021-07-27 DIAGNOSIS — H409 Unspecified glaucoma: Secondary | ICD-10-CM | POA: Diagnosis not present

## 2021-07-27 DIAGNOSIS — K760 Fatty (change of) liver, not elsewhere classified: Secondary | ICD-10-CM | POA: Diagnosis not present

## 2021-07-27 DIAGNOSIS — J189 Pneumonia, unspecified organism: Secondary | ICD-10-CM | POA: Diagnosis not present

## 2021-07-27 DIAGNOSIS — I10 Essential (primary) hypertension: Secondary | ICD-10-CM | POA: Diagnosis not present

## 2021-07-27 DIAGNOSIS — G629 Polyneuropathy, unspecified: Secondary | ICD-10-CM | POA: Diagnosis not present

## 2021-07-27 DIAGNOSIS — M479 Spondylosis, unspecified: Secondary | ICD-10-CM | POA: Diagnosis not present

## 2021-07-27 DIAGNOSIS — Z8546 Personal history of malignant neoplasm of prostate: Secondary | ICD-10-CM | POA: Diagnosis not present

## 2021-08-05 DIAGNOSIS — R6510 Systemic inflammatory response syndrome (SIRS) of non-infectious origin without acute organ dysfunction: Secondary | ICD-10-CM | POA: Diagnosis not present

## 2021-08-05 DIAGNOSIS — G214 Vascular parkinsonism: Secondary | ICD-10-CM | POA: Diagnosis not present

## 2021-08-05 DIAGNOSIS — A419 Sepsis, unspecified organism: Secondary | ICD-10-CM | POA: Diagnosis not present

## 2021-08-05 DIAGNOSIS — R2689 Other abnormalities of gait and mobility: Secondary | ICD-10-CM | POA: Diagnosis not present

## 2021-12-04 DIAGNOSIS — G214 Vascular parkinsonism: Secondary | ICD-10-CM | POA: Diagnosis not present

## 2021-12-04 DIAGNOSIS — Z136 Encounter for screening for cardiovascular disorders: Secondary | ICD-10-CM | POA: Diagnosis not present

## 2021-12-04 DIAGNOSIS — Z23 Encounter for immunization: Secondary | ICD-10-CM | POA: Diagnosis not present

## 2021-12-04 DIAGNOSIS — H6122 Impacted cerumen, left ear: Secondary | ICD-10-CM | POA: Diagnosis not present

## 2021-12-04 DIAGNOSIS — Z0000 Encounter for general adult medical examination without abnormal findings: Secondary | ICD-10-CM | POA: Diagnosis not present

## 2022-03-25 DIAGNOSIS — R300 Dysuria: Secondary | ICD-10-CM | POA: Diagnosis not present

## 2022-03-25 DIAGNOSIS — Z6825 Body mass index (BMI) 25.0-25.9, adult: Secondary | ICD-10-CM | POA: Diagnosis not present

## 2022-08-24 ENCOUNTER — Emergency Department (HOSPITAL_COMMUNITY)
Admission: EM | Admit: 2022-08-24 | Discharge: 2022-08-25 | Disposition: A | Discharge status: Skilled Nursing Facility | Payer: Medicare HMO | Attending: Emergency Medicine | Admitting: Emergency Medicine

## 2022-08-24 ENCOUNTER — Encounter (HOSPITAL_COMMUNITY): Payer: Self-pay | Admitting: Emergency Medicine

## 2022-08-24 ENCOUNTER — Other Ambulatory Visit: Payer: Self-pay

## 2022-08-24 DIAGNOSIS — Z8546 Personal history of malignant neoplasm of prostate: Secondary | ICD-10-CM | POA: Diagnosis not present

## 2022-08-24 DIAGNOSIS — R319 Hematuria, unspecified: Secondary | ICD-10-CM | POA: Diagnosis not present

## 2022-08-24 DIAGNOSIS — N3289 Other specified disorders of bladder: Secondary | ICD-10-CM | POA: Diagnosis not present

## 2022-08-24 DIAGNOSIS — R300 Dysuria: Secondary | ICD-10-CM | POA: Diagnosis not present

## 2022-08-24 DIAGNOSIS — R109 Unspecified abdominal pain: Secondary | ICD-10-CM | POA: Diagnosis not present

## 2022-08-24 DIAGNOSIS — R310 Gross hematuria: Secondary | ICD-10-CM | POA: Diagnosis not present

## 2022-08-24 LAB — URINALYSIS, W/ REFLEX TO CULTURE (INFECTION SUSPECTED)
Bacteria, UA: NONE SEEN
Bilirubin Urine: NEGATIVE
Glucose, UA: NEGATIVE mg/dL
Ketones, ur: NEGATIVE mg/dL
Leukocytes,Ua: NEGATIVE
Nitrite: NEGATIVE
Protein, ur: 100 mg/dL — AB
RBC / HPF: >50 RBC/hpf (ref 0–5)
Specific Gravity, Urine: 1.016 (ref 1.005–1.030)
pH: 6 (ref 5.0–8.0)

## 2022-08-24 LAB — CBC
HCT: 40.6 % (ref 39.0–52.0)
Hemoglobin: 13.9 g/dL (ref 13.0–17.0)
MCH: 30.4 pg (ref 26.0–34.0)
MCHC: 34.2 g/dL (ref 30.0–36.0)
MCV: 88.8 fL (ref 80.0–100.0)
Platelets: 392 10*3/uL (ref 150–400)
RBC: 4.57 MIL/uL (ref 4.22–5.81)
RDW: 12.8 % (ref 11.5–15.5)
WBC: 8.1 10*3/uL (ref 4.0–10.5)
nRBC: 0 % (ref 0.0–0.2)

## 2022-08-24 LAB — BASIC METABOLIC PANEL
Anion gap: 9 (ref 5–15)
BUN: 20 mg/dL (ref 8–23)
CO2: 27 mmol/L (ref 22–32)
Calcium: 9.4 mg/dL (ref 8.9–10.3)
Chloride: 101 mmol/L (ref 98–111)
Creatinine, Ser: 0.69 mg/dL (ref 0.61–1.24)
GFR, Estimated: >60 mL/min (ref >60)
Glucose, Bld: 106 mg/dL — ABNORMAL HIGH (ref 70–99)
Potassium: 4.1 mmol/L (ref 3.5–5.1)
Sodium: 137 mmol/L (ref 135–145)

## 2022-08-24 NOTE — ED Notes (Signed)
[facility] called for pt transportation. Nursing Facility called and given report.

## 2022-08-24 NOTE — ED Notes (Signed)
Bladder scan 299 mL.

## 2022-08-24 NOTE — ED Provider Notes (Signed)
[place] EMERGENCY DEPARTMENT AT [facility] Provider Note   CSN: [id number] Arrival date & time: 08/24/22  1750     History  Chief Complaint  Patient presents with   Hematuria    Vernon Maish is a 89 y.o. male.   Hematuria     Pt presented to the ED because he was not able to urinate.  Pt has noticed blood in the urine today.  He has had some pain.  No fever.  No vomiting or diarrhea.  Patient states he has had issues with his urethra in the past.  He is not on any blood thinning medications.  Home Medications Prior to Admission medications   Medication Sig Start Date End Date Taking? Authorizing Provider  acetaminophen (TYLENOL) 500 MG tablet Take 500 mg by mouth daily as needed for mild pain or headache.    [provider]  BIOTIN PO Take 1 tablet by mouth daily.    [provider]  Carbidopa-Levodopa ER (SINEMET CR) 25-100 MG tablet controlled release TAKE 1 TABLET BY MOUTH THREE TIMES A DAY Patient not taking: Reported on 06/18/2021 03/13/18   Tat, Octaviano Batty, DO  Cholecalciferol (VITAMIN D3 SUPER STRENGTH) 50 MCG (2000 UT) CAPS Take 2,000 Units by mouth daily.    [provider]  Cyanocobalamin (VITAMIN B-12 PO) Take 1 tablet by mouth daily.    [provider]  latanoprost (XALATAN) 0.005 % ophthalmic solution Place 1 drop into both eyes at bedtime.     [provider]  Magnesium 250 MG TABS Take 250 mg by mouth daily.    [provider]  Multiple Vitamins-Minerals (CENTRUM SILVER 50+MEN) TABS Take 1 tablet by mouth daily with breakfast.    [provider]  niacin 100 MG tablet Take 100 mg by mouth at bedtime.    [provider]  zinc gluconate 50 MG tablet Take 50 mg by mouth daily.    [provider]      Allergies    Timolol maleate    Review of Systems   Review of Systems  Genitourinary:  Positive for hematuria.    Physical Exam Updated Vital Signs BP (!) 176/74    Pulse 92   Temp 98.4 F (36.9 C) (Oral)   Resp 16   Ht 1.575 m (5\' 2" )   Wt 63.5 kg   SpO2 99%   BMI 25.61 kg/m  Physical Exam Vitals and nursing note reviewed.  Constitutional:      General: He is not in acute distress.    Appearance: He is well-developed.  HENT:     Head: Normocephalic and atraumatic.     Right Ear: External ear normal.     Left Ear: External ear normal.  Eyes:     General: No scleral icterus.       Right eye: No discharge.        Left eye: No discharge.     Conjunctiva/sclera: Conjunctivae normal.  Neck:     Trachea: No tracheal deviation.  Cardiovascular:     Rate and Rhythm: Normal rate.  Pulmonary:     Effort: Pulmonary effort is normal. No respiratory distress.     Breath sounds: No stridor.  Abdominal:     General: There is no distension.  Musculoskeletal:        General: No swelling or deformity.     Cervical back: Neck supple.  Skin:    General: Skin is warm and dry.     Findings:  No rash.  Neurological:     Mental Status: He is alert. Mental status is at baseline.     Cranial Nerves: No dysarthria or facial asymmetry.     Motor: No seizure activity.     ED Results / Procedures / Treatments   Labs (all labs ordered are listed, but only abnormal results are displayed) Labs Reviewed  BASIC METABOLIC PANEL - Abnormal; Notable for the following components:      Result Value   Glucose, Bld 106 (*)    All other components within normal limits  URINALYSIS, W/ REFLEX TO CULTURE (INFECTION SUSPECTED) - Abnormal; Notable for the following components:   Color, Urine RED (*)    APPearance CLOUDY (*)    Hgb urine dipstick LARGE (*)    Protein, ur 100 (*)    All other components within normal limits  URINE CULTURE  CBC    EKG None  Radiology No results found.  Procedures Procedures    Medications Ordered in ED Medications - No data to display  ED Course/ Medical Decision Making/ A&P Clinical Course as of 08/24/22 2246  Tue Aug 24, 2022  2205 CBC cBC normal.  Metabolic panel normal [JK]  2243 Pt has been able to urinate in the ED.   No signs of urinary retention.  Will send off urine culture [JK]    Clinical Course User Index [JK] Linwood Dibbles, MD                             Medical Decision Making Amount and/or Complexity of Data Reviewed Labs: ordered. Decision-making details documented in ED Course.   Patient presented to ER for evaluation of urinary discomfort.  Afebrile no signs of infection.  Laboratory tests are reassuring.  No signs of  Acute kidney injury or anemia.  Urinalysis shows primarily hematuria.  With his dysuria we will send off a urine culture.  Patient will need to follow-up with urology for further evaluation of his gross hematuria.  Evaluation and diagnostic testing in the emergency department does not suggest an emergent condition requiring admission or immediate intervention beyond what has been performed at this time.  The patient is safe for discharge and has been instructed to return immediately for worsening symptoms, change in symptoms or any other concerns.        Final Clinical Impression(s) / ED Diagnoses Final diagnoses:  Gross hematuria    Rx / DC Orders ED Discharge Orders     None         Linwood Dibbles, MD 08/24/22 2246

## 2022-08-24 NOTE — Discharge Instructions (Addendum)
Try to stay well hydrated.  Follow up with the urologist for further evaluation

## 2022-08-24 NOTE — ED Triage Notes (Signed)
Pt BIB EMS from skilled nursing, c/o hematuria x days. Per EMS, pt has had difficulty voiding accompanied with pain. Alert and oriented x4, hx of urosepsis.   BP 140/68 P 84 spO2 96% T 98.1 CBG 119

## 2022-08-24 NOTE — ED Notes (Addendum)
RN tried to call pt daughter twice to update. Pt daughter did not respond.

## 2022-08-25 ENCOUNTER — Emergency Department (HOSPITAL_COMMUNITY): Payer: Medicare HMO

## 2022-08-25 ENCOUNTER — Emergency Department (HOSPITAL_COMMUNITY)
Admission: EM | Admit: 2022-08-25 | Discharge: 2022-08-25 | Disposition: A | Discharge status: Skilled Nursing Facility | Payer: Medicare HMO | Source: Home / Self Care | Attending: Emergency Medicine | Admitting: Emergency Medicine

## 2022-08-25 ENCOUNTER — Encounter (HOSPITAL_COMMUNITY): Payer: Self-pay

## 2022-08-25 DIAGNOSIS — N3289 Other specified disorders of bladder: Secondary | ICD-10-CM | POA: Insufficient documentation

## 2022-08-25 DIAGNOSIS — R319 Hematuria, unspecified: Secondary | ICD-10-CM | POA: Diagnosis not present

## 2022-08-25 DIAGNOSIS — Z8546 Personal history of malignant neoplasm of prostate: Secondary | ICD-10-CM | POA: Insufficient documentation

## 2022-08-25 DIAGNOSIS — R109 Unspecified abdominal pain: Secondary | ICD-10-CM | POA: Diagnosis not present

## 2022-08-25 DIAGNOSIS — Z7401 Bed confinement status: Secondary | ICD-10-CM | POA: Diagnosis not present

## 2022-08-25 DIAGNOSIS — I10 Essential (primary) hypertension: Secondary | ICD-10-CM | POA: Diagnosis not present

## 2022-08-25 DIAGNOSIS — R531 Weakness: Secondary | ICD-10-CM | POA: Diagnosis not present

## 2022-08-25 DIAGNOSIS — R310 Gross hematuria: Secondary | ICD-10-CM | POA: Insufficient documentation

## 2022-08-25 DIAGNOSIS — R300 Dysuria: Secondary | ICD-10-CM | POA: Diagnosis not present

## 2022-08-25 LAB — COMPREHENSIVE METABOLIC PANEL
ALT: 23 U/L (ref 0–44)
AST: 20 U/L (ref 15–41)
Albumin: 4.4 g/dL (ref 3.5–5.0)
Alkaline Phosphatase: 69 U/L (ref 38–126)
Anion gap: 11 (ref 5–15)
BUN: 17 mg/dL (ref 8–23)
CO2: 26 mmol/L (ref 22–32)
Calcium: 9.5 mg/dL (ref 8.9–10.3)
Chloride: 99 mmol/L (ref 98–111)
Creatinine, Ser: 0.81 mg/dL (ref 0.61–1.24)
GFR, Estimated: >60 mL/min (ref >60)
Glucose, Bld: 144 mg/dL — ABNORMAL HIGH (ref 70–99)
Potassium: 4.1 mmol/L (ref 3.5–5.1)
Sodium: 136 mmol/L (ref 135–145)
Total Bilirubin: 1.4 mg/dL — ABNORMAL HIGH (ref 0.3–1.2)
Total Protein: 7.4 g/dL (ref 6.5–8.1)

## 2022-08-25 LAB — CBC WITH DIFFERENTIAL/PLATELET
Abs Immature Granulocytes: 0.03 10*3/uL (ref 0.00–0.07)
Basophils Absolute: 0 10*3/uL (ref 0.0–0.1)
Basophils Relative: 0 %
Eosinophils Absolute: 0 10*3/uL (ref 0.0–0.5)
Eosinophils Relative: 0 %
HCT: 37.4 % — ABNORMAL LOW (ref 39.0–52.0)
Hemoglobin: 13 g/dL (ref 13.0–17.0)
Immature Granulocytes: 0 %
Lymphocytes Relative: 9 %
Lymphs Abs: 1 10*3/uL (ref 0.7–4.0)
MCH: 30.4 pg (ref 26.0–34.0)
MCHC: 34.8 g/dL (ref 30.0–36.0)
MCV: 87.4 fL (ref 80.0–100.0)
Monocytes Absolute: 0.5 10*3/uL (ref 0.1–1.0)
Monocytes Relative: 5 %
Neutro Abs: 8.7 10*3/uL — ABNORMAL HIGH (ref 1.7–7.7)
Neutrophils Relative %: 86 %
Platelets: 372 10*3/uL (ref 150–400)
RBC: 4.28 MIL/uL (ref 4.22–5.81)
RDW: 12.6 % (ref 11.5–15.5)
WBC: 10.2 10*3/uL (ref 4.0–10.5)
nRBC: 0 % (ref 0.0–0.2)

## 2022-08-25 LAB — URINALYSIS, ROUTINE W REFLEX MICROSCOPIC
Bilirubin Urine: NEGATIVE
Glucose, UA: NEGATIVE mg/dL
Ketones, ur: NEGATIVE mg/dL
Leukocytes,Ua: NEGATIVE
Nitrite: NEGATIVE
Protein, ur: 100 mg/dL — AB
RBC / HPF: >50 RBC/hpf (ref 0–5)
Specific Gravity, Urine: 1.01 (ref 1.005–1.030)
pH: 8 (ref 5.0–8.0)

## 2022-08-25 LAB — URINE CULTURE: Culture: NO GROWTH

## 2022-08-25 MED ORDER — TAMSULOSIN HCL 0.4 MG PO CAPS
0.4000 mg | ORAL_CAPSULE | Freq: Once | ORAL | Status: AC
  Administered 2022-08-25: 0.4 mg via ORAL
  Filled 2022-08-25: qty 1

## 2022-08-25 MED ORDER — SODIUM CHLORIDE 0.9 % IV BOLUS
500.0000 mL | Freq: Once | INTRAVENOUS | Status: AC
  Administered 2022-08-25: 500 mL via INTRAVENOUS

## 2022-08-25 MED ORDER — TAMSULOSIN HCL 0.4 MG PO CAPS: 0.4000 mg | ORAL_CAPSULE | Freq: Every day | ORAL | 0 refills | Status: AC

## 2022-08-25 NOTE — ED Notes (Signed)
Patient able to urinate after fluids, red tinged urine.

## 2022-08-25 NOTE — ED Notes (Signed)
Attempted to call [facility] facility with no answer.

## 2022-08-25 NOTE — ED Provider Notes (Signed)
[place] EMERGENCY DEPARTMENT AT [facility] Provider Note   CSN: [id number] Arrival date & time: 08/25/22  1548     History  Chief Complaint  Patient presents with   Hematuria    Jeffrey Porter is a 89 y.o. male.  Patient seen and evaluated 1 after being seen here with concern for similar issues, not yet having seen his urologist. History is notable for prostate cancer, radiation therapy, brachytherapy, and known urethral stricture. Over the past few days patient has a diminished urine production, with increasing visible blood.  No true abdominal pain, fever, nausea, vomiting or other systemic complaints. He was seen yesterday, scheduled follow-up with urology, but with ongoing concerns is here for evaluation.       Home Medications Prior to Admission medications   Medication Sig Start Date End Date Taking? Authorizing Provider  tamsulosin (FLOMAX) 0.4 MG CAPS capsule Take 1 capsule (0.4 mg total) by mouth daily. 08/25/22 09/24/22 Yes Gerhard Munch, MD  acetaminophen (TYLENOL) 500 MG tablet Take 500 mg by mouth daily as needed for mild pain or headache.    [provider]  BIOTIN PO Take 1 tablet by mouth daily.    [provider]  Carbidopa-Levodopa ER (SINEMET CR) 25-100 MG tablet controlled release TAKE 1 TABLET BY MOUTH THREE TIMES A DAY Patient not taking: Reported on 06/18/2021 03/13/18   Tat, Octaviano Batty, DO  Cholecalciferol (VITAMIN D3 SUPER STRENGTH) 50 MCG (2000 UT) CAPS Take 2,000 Units by mouth daily.    [provider]  Cyanocobalamin (VITAMIN B-12 PO) Take 1 tablet by mouth daily.    [provider]  latanoprost (XALATAN) 0.005 % ophthalmic solution Place 1 drop into both eyes at bedtime.     [provider]  Magnesium 250 MG TABS Take 250 mg by mouth daily.    [provider]  Multiple Vitamins-Minerals (CENTRUM SILVER 50+MEN) TABS Take 1 tablet by mouth daily with breakfast.    [provider]   niacin 100 MG tablet Take 100 mg by mouth at bedtime.    [provider]  zinc gluconate 50 MG tablet Take 50 mg by mouth daily.    [provider]      Allergies    Timolol maleate    Review of Systems   Review of Systems  All other systems reviewed and are negative.   Physical Exam Updated Vital Signs BP (!) 163/72 (BP Location: Right Arm)   Pulse 94   Temp 98.1 F (36.7 C) (Oral)   Resp 18   Wt 63 kg   SpO2 98%   BMI 25.40 kg/m  Physical Exam Vitals and nursing note reviewed.  Constitutional:      General: He is not in acute distress.    Appearance: He is well-developed.  HENT:     Head: Normocephalic and atraumatic.  Eyes:     Conjunctiva/sclera: Conjunctivae normal.  Pulmonary:     Effort: Pulmonary effort is normal. No respiratory distress.     Breath sounds: No stridor.  Abdominal:     General: There is no distension.     Tenderness: There is abdominal tenderness. There is no guarding.  Skin:    General: Skin is warm and dry.  Neurological:     Mental Status: He is alert and oriented to person, place, and time.     ED Results / Procedures / Treatments   Labs (all labs ordered are listed, but only abnormal results are displayed) Labs Reviewed  COMPREHENSIVE METABOLIC PANEL - Abnormal; Notable for the following components:      Result Value   Glucose, Bld 144 (*)    Total Bilirubin 1.4 (*)    All other components within normal limits  CBC WITH DIFFERENTIAL/PLATELET - Abnormal; Notable for the following components:   HCT 37.4 (*)    Neutro Abs 8.7 (*)    All other components within normal limits  URINALYSIS, ROUTINE W REFLEX MICROSCOPIC    EKG None  Radiology CT Renal Stone Study  Result Date: 08/25/2022 CLINICAL DATA:  Abdominal/flank pain, stone suspected. Urinary retention. EXAM: CT ABDOMEN AND PELVIS WITHOUT CONTRAST TECHNIQUE: Multidetector CT imaging of the abdomen and pelvis was performed following the standard protocol  without IV contrast. RADIATION DOSE REDUCTION: This exam was performed according to the departmental dose-optimization program which includes automated exposure control, adjustment of the mA and/or kV according to patient size and/or use of iterative reconstruction technique. COMPARISON:  CT the abdomen pelvis with contrast 12/08/2017. FINDINGS: Lower chest: The lung bases are clear without focal nodule, mass, or airspace disease. The heart size is normal. No significant pleural or pericardial effusion is present. Hepatobiliary: No focal liver abnormality is seen. No gallstones, gallbladder wall thickening, or biliary dilatation. Pancreas: Unremarkable. No pancreatic ductal dilatation or surrounding inflammatory changes. Spleen: Normal in size without focal abnormality. Adrenals/Urinary Tract: Adrenal glands are unremarkable. Kidneys are normal, without renal calculi, focal lesion, or hydronephrosis. Irregular soft tissue density is present in the right posterolateral aspect of the bladder measuring 2.2 x 1.4 x 2.4 cm. No other focal lesions are evident on this noncontrast study. Stomach/Bowel: Stomach and duodenum are within normal limits. The small bowel is unremarkable. Terminal ileum is within normal limits. Appendix is visualized and normal. The ascending and transverse colon are within normal limits. The descending and sigmoid colon are normal. Vascular/Lymphatic: Dense atherosclerotic calcifications are present in the aorta branch vessels. No aneurysm is present. Reproductive: Metallic treatment seeds are present in the prostate gland. Other: No abdominal wall hernia or abnormality. No abdominopelvic ascites. Musculoskeletal: Degenerative changes the lumbar spine are most evident at L5-S1. Closest is present across the disc space at L5-S1. Facet spurring contributes to foraminal narrowing, right greater than left. No focal osseous lesions are present. SI joints are fused. The bony pelvis is otherwise within  normal limits. The hips are located. Mild degenerative changes are present bilaterally, mild degenerative changes are present bilaterally. IMPRESSION: 1. Irregular soft tissue density in the right posterolateral aspect of the bladder measuring 2.2 x 1.4 x 2.4 cm. This is concerning for a primary bladder neoplasm. Recommend referral to urology for further evaluation. 2. No other acute or focal lesion to explain the patient's symptoms. 3. Degenerative changes of the lumbar spine and hips. 4.  Aortic Atherosclerosis (ICD10-I70.0). These results were called by telephone at the time of interpretation on 08/25/2022 at 5:04 pm to provider Gerhard Munch , who verbally acknowledged these results. Electronically Signed   By: Marin Roberts M.D.   On: 08/25/2022 17:06    Procedures Procedures    Medications Ordered in ED Medications  sodium chloride 0.9 % bolus 500 mL (0 mLs Intravenous Stopped 08/25/22 2020)  tamsulosin (FLOMAX) capsule 0.4 mg (0.4 mg Oral Given 08/25/22 1917)    ED Course/ Medical Decision Making/ A&P                             Medical Decision Making Adult male  with history of multiple medical issues most relevantly prostate cancer, urethral stricture now presents with hematuria and decreased urine production.  Differential including infection, retention, malignancy considered.  I reviewed yesterday's chart including results which are generally reassuring, urinalysis followed by urine culture. Patient had additional labs, repeat UA, CT ordered here.  Amount and/or Complexity of Data Reviewed External Data Reviewed: notes. Labs: ordered. Decision-making details documented in ED Course. Radiology: ordered and independent interpretation performed. Decision-making details documented in ED Course. Discussion of management or test interpretation with external provider(s): I discussed patient's presentation with our radiologist, Dr. Sheliah Hatch, via telephone and subsequent with her  urologist, Dr Mena Goes.  Patient found to have abnormal findings in the bladder concerning for malignancy.  Risk Prescription drug management. Decision regarding hospitalization.   8:52 PM Patient has been accompanied by his daughter for some time, with her I discussed all findings, and conversations with urology and radiology.  Patient has an appointment in 48 hours with urology.  After discussion with those colleagues, now that the patient has demonstrated he can urinate, Foley catheter will not be placed, though the patient was encouraged to return should he develop true acute urinary retention, otherwise it is important that he follows up with the urologist in the clinic in 48 hours for additional evaluation of his newly elucidated bladder mass and hematuria.        Final Clinical Impression(s) / ED Diagnoses Final diagnoses:  Gross hematuria  Bladder mass    Rx / DC Orders ED Discharge Orders          Ordered    tamsulosin (FLOMAX) 0.4 MG CAPS capsule  Daily        08/25/22 2051              Gerhard Munch, MD 08/25/22 2052

## 2022-08-25 NOTE — Discharge Instructions (Addendum)
As discussed, with your blood in urine, and difficulty urinating is very important you follow-up with your urologist as scheduled on Friday.  If you develop an inability to urinate entirely, do not hesitate to return here.

## 2022-08-25 NOTE — ED Notes (Signed)
[facility] called for transport.  

## 2022-08-25 NOTE — ED Triage Notes (Addendum)
BIBA from [facility] with c/o urinary retention and dribbling with painful urination.  Blood noted to brief with clots on arrival. Bladder scan on arrival.  Pt seen in ER yesterday

## 2022-08-25 NOTE — ED Notes (Signed)
[facility] here to take patient back to [facility]. IV removed with catheter intact. Patient taken out of ED via stretcher.

## 2022-08-27 DIAGNOSIS — R310 Gross hematuria: Secondary | ICD-10-CM | POA: Diagnosis not present

## 2022-08-27 DIAGNOSIS — D414 Neoplasm of uncertain behavior of bladder: Secondary | ICD-10-CM | POA: Diagnosis not present

## 2022-08-27 DIAGNOSIS — R3912 Poor urinary stream: Secondary | ICD-10-CM | POA: Diagnosis not present

## 2022-08-31 ENCOUNTER — Encounter (HOSPITAL_BASED_OUTPATIENT_CLINIC_OR_DEPARTMENT_OTHER): Payer: Self-pay | Admitting: Urology

## 2022-08-31 NOTE — Progress Notes (Signed)
Spoke w/ via phone for pre-op interview--- Jeffrey Porter and daughter [name] Lab needs dos----  [employer] results------ COVID test -----patient states asymptomatic no test needed Arrive at -------1030 NPO after MN NO Solid Food.   Med rec completed Medications to take morning of surgery -----Flomax and eye gtts Diabetic medication ----- Patient instructed no nail polish to be worn day of surgery Patient instructed to bring photo id and insurance card day of surgery Patient aware to have Driver (ride ) / caregiver  Daughter [name]  for 24 hours after surgery  Patient Special Instructions ----- Pre-Op special Instructions ----- Patient verbalized understanding of instructions that were given at this phone interview. Patient denies shortness of breath, chest pain, fever, cough at this phone interview.  Patient coming from [facility] facility, daughter to meet them here.  Per daughter,Patient in wheelchair but is able to transfer, go to restroom with walker.

## 2022-09-06 DIAGNOSIS — Z993 Dependence on wheelchair: Secondary | ICD-10-CM | POA: Diagnosis not present

## 2022-09-06 DIAGNOSIS — N3289 Other specified disorders of bladder: Secondary | ICD-10-CM | POA: Diagnosis not present

## 2022-09-08 DIAGNOSIS — R310 Gross hematuria: Secondary | ICD-10-CM | POA: Diagnosis not present

## 2022-09-10 NOTE — H&P (Signed)
CC/HPI: cc: bladder mass   08/27/22: 89 year old man with a history of a bulbar urethral stricture who developed gross hematuria and difficulty urinating which prompted a visit to the ED. Patient was started on tamsulosin however ultimately returned to the ED with similar symptoms and underwent a CT scan that showed a mass in the bladder concerning for malignancy. Patient is not on any blood thinners.   09/08/22: Cystoscopy for bladder mass evaluation     ALLERGIES: None   MEDICATIONS: Amoxicillin 500 mg tablet  Centrum Silver  Latanoprost 0.005 % drops  Magnesium  Pure L-Tyrosine  Tylenol  Vitamin D3  Zinc     GU PSH: Cysto Dilate Stricture (M or F) - 2021 Cystoscopy - 2021 Locm 300-399Mg /Ml Iodine,1Ml - 2021       PSH Notes: Urethral Dialation- 2021   NON-GU PSH: Cataract surgery, Bilateral Inguinal Hernia Repair > 5 yrs, Left     GU PMH: Bladder tumor/neoplasm - 08/27/2022 Gross hematuria (Stable) - 08/27/2022, He has recurrent hematuria and needs reevaluation. I will get a BUN/Cr today and have him return for a CT hematuria study and cystoscopy. , - 2021 Weak Urinary Stream - 08/27/2022 Urinary Retention - 2021 Membranous urethral stricture, He has had no further hematuria but has a severe membranous and prostatic urethral stricture but minimal voiding complaints. He is not interested in intervention for the stricture. I will get a cytology today but he states he doesn't want to do anything regardless of the result. I will have him return in 3 months. - 2021 Personal Hx Urinary Tract Infections, I have given him a script for amoxicillin should he have a recurrent UTI. 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 - 2021 History of prostate cancer, I will get his most recent PSA from Dr. Hyacinth Meeker. - 2021 Prostate Cancer    NON-GU PMH: Arrhythmia Arthritis Cardiac murmur,  unspecified Glaucoma Hypercholesterolemia    FAMILY HISTORY: 2 daughters - Daughter Diabetes - Mother, Father   SOCIAL HISTORY: Marital Status: Widowed Current Smoking Status: Patient does not smoke anymore. Has not smoked since 04/26/2014. Smoked for 30 years. Smoked less than 1/2 pack per day.   Tobacco Use Assessment Completed: Used Tobacco in last 30 days? Does not drink anymore.  Drinks 2 caffeinated drinks per day. 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    REVIEW OF SYSTEMS:    GU Review Male:   Patient denies frequent urination, hard to postpone urination, burning/ pain with urination, get up at night to urinate, leakage of urine, stream starts and stops, trouble starting your stream, have to strain to urinate , erection problems, and penile pain.  Gastrointestinal (Upper):   Patient denies nausea, vomiting, and indigestion/ heartburn.  Gastrointestinal (Lower):   Patient denies diarrhea and constipation.  Constitutional:   Patient denies fever, night sweats, weight loss, and fatigue.  Skin:   Patient denies skin rash/ lesion and itching.  Eyes:   Patient denies blurred vision and double vision.  Ears/ Nose/ Throat:   Patient denies sore throat and sinus problems.  Hematologic/Lymphatic:   Patient denies swollen glands and easy bruising.  Cardiovascular:   Patient denies leg swelling and chest pains.  Respiratory:   Patient denies cough and shortness of breath.  Endocrine:   Patient denies excessive thirst.  Musculoskeletal:   Patient denies back pain and joint pain.  Neurological:   Patient denies headaches and dizziness.  Psychologic:   Patient denies  depression and anxiety.   VITAL SIGNS:      09/08/2022 03:35 PM  BP 167/67 mmHg  Pulse 91 /min   MULTI-SYSTEM PHYSICAL EXAMINATION:    Constitutional: Well-nourished. No physical deformities. Normally developed. Good grooming.  Neck: Neck symmetrical, not swollen. Normal tracheal position.  Respiratory: No labored  breathing, no use of accessory muscles.   Skin: No paleness, no jaundice, no cyanosis. No lesion, no ulcer, no rash.  Neurologic / Psychiatric: Oriented to time, oriented to place, oriented to person. No depression, no anxiety, no agitation.  Eyes: Normal conjunctivae. Normal eyelids.  Ears, Nose, Mouth, and Throat: Left ear no scars, no lesions, no masses. Right ear no scars, no lesions, no masses. Nose no scars, no lesions, no masses. Normal hearing. Normal lips.  Musculoskeletal: Normal gait and station of head and neck.     Complexity of Data:  Records Review:   Previous Patient Records, POC Tool  Urine Test Review:   Urinalysis  X-Ray Review: C.T. Abdomen/Pelvis: Reviewed Films. Discussed With Patient.     09/12/17  PSA  Total PSA 0.03 ng/dl    PROCEDURES:         Flexible Cystoscopy - 52000  Risks, benefits, and some of the potential complications of the procedure were discussed at length with the patient including infection, bleeding, voiding discomfort, urinary retention, fever, chills, sepsis, and others. All questions were answered. Informed consent was obtained. Sterile technique and intraurethral analgesia were used.  Meatus:  Normal size. Normal location. Normal condition.  Urethra:  No strictures. Small calcification adherent to bulbar urethra at prior stricture site.  External Sphincter:  Normal.  Verumontanum:  Normal.  Prostate:  Non-obstructing. No hyperplasia.  Bladder Neck:  Non-obstructing.  Ureteral Orifices:  Normal location. Normal size. Normal shape. Effluxed clear urine.  Bladder:  Mild trabeculation. 2 cm papillary bladder tumor anterior to right UO. No stones.      The lower urinary tract was carefully examined. The procedure was well-tolerated and without complications. Antibiotic instructions were given. Instructions were given to call the office immediately for bloody urine, difficulty urinating, urinary retention, painful or frequent urination, fever,  chills, nausea, vomiting or other illness. The patient stated that he understood these instructions and would comply with them.   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

## 2022-09-13 NOTE — Progress Notes (Signed)
Returned pt's daughter, [name], call .  Inquiring to confirm pt is to be npo after midnight including water and is pt to take his flomax morning of surgery, if so, can he have water to take it with.  Told pt's daughter [name] correct pt is to be npo after midnight including no water with exception sips with his flomax.  But, [name] stated pt is worried it will upset his stomach taking it without food.  Told [name] it is okay for pt not to take flomax morning of surgery since pt is worried about it.  He can brush is teeth rinse his mouth out but not water, candy gum, mints.  Pt's daughter verbalized understanding. Pt surgery is 09-15-2022 @ [facility].

## 2022-09-14 ENCOUNTER — Other Ambulatory Visit: Payer: Self-pay

## 2022-09-14 ENCOUNTER — Ambulatory Visit (HOSPITAL_BASED_OUTPATIENT_CLINIC_OR_DEPARTMENT_OTHER)
Admission: RE | Admit: 2022-09-14 | Discharge: 2022-09-14 | Disposition: A | Discharge status: Home / Self Care | Payer: Medicare HMO | Attending: Urology | Admitting: Urology

## 2022-09-14 ENCOUNTER — Ambulatory Visit (HOSPITAL_BASED_OUTPATIENT_CLINIC_OR_DEPARTMENT_OTHER): Payer: Medicare HMO | Admitting: Anesthesiology

## 2022-09-14 ENCOUNTER — Encounter (HOSPITAL_BASED_OUTPATIENT_CLINIC_OR_DEPARTMENT_OTHER): Payer: Self-pay | Admitting: Urology

## 2022-09-14 ENCOUNTER — Encounter (HOSPITAL_BASED_OUTPATIENT_CLINIC_OR_DEPARTMENT_OTHER)
Admission: RE | Disposition: A | Discharge status: Home / Self Care | Payer: Self-pay | Source: Home / Self Care | Attending: Urology

## 2022-09-14 DIAGNOSIS — Z87891 Personal history of nicotine dependence: Secondary | ICD-10-CM | POA: Insufficient documentation

## 2022-09-14 DIAGNOSIS — D090 Carcinoma in situ of bladder: Secondary | ICD-10-CM | POA: Insufficient documentation

## 2022-09-14 DIAGNOSIS — N211 Calculus in urethra: Secondary | ICD-10-CM

## 2022-09-14 DIAGNOSIS — N35919 Unspecified urethral stricture, male, unspecified site: Secondary | ICD-10-CM | POA: Diagnosis not present

## 2022-09-14 DIAGNOSIS — N401 Enlarged prostate with lower urinary tract symptoms: Secondary | ICD-10-CM | POA: Insufficient documentation

## 2022-09-14 DIAGNOSIS — I10 Essential (primary) hypertension: Secondary | ICD-10-CM

## 2022-09-14 DIAGNOSIS — N138 Other obstructive and reflux uropathy: Secondary | ICD-10-CM | POA: Diagnosis not present

## 2022-09-14 DIAGNOSIS — N201 Calculus of ureter: Secondary | ICD-10-CM | POA: Diagnosis not present

## 2022-09-14 DIAGNOSIS — R338 Other retention of urine: Secondary | ICD-10-CM | POA: Insufficient documentation

## 2022-09-14 DIAGNOSIS — R310 Gross hematuria: Secondary | ICD-10-CM | POA: Diagnosis not present

## 2022-09-14 DIAGNOSIS — C679 Malignant neoplasm of bladder, unspecified: Secondary | ICD-10-CM | POA: Diagnosis not present

## 2022-09-14 DIAGNOSIS — D494 Neoplasm of unspecified behavior of bladder: Secondary | ICD-10-CM | POA: Diagnosis not present

## 2022-09-14 HISTORY — PX: HOLMIUM LASER APPLICATION: SHX5852

## 2022-09-14 HISTORY — PX: TRANSURETHRAL RESECTION OF BLADDER TUMOR: SHX2575

## 2022-09-14 SURGERY — TURBT (TRANSURETHRAL RESECTION OF BLADDER TUMOR)
Anesthesia: General

## 2022-09-14 MED ORDER — DEXAMETHASONE SODIUM PHOSPHATE 10 MG/ML IJ SOLN
INTRAMUSCULAR | Status: DC | PRN
  Administered 2022-09-14: 5 mg via INTRAVENOUS

## 2022-09-14 MED ORDER — SUCCINYLCHOLINE CHLORIDE 200 MG/10ML IV SOSY
PREFILLED_SYRINGE | INTRAVENOUS | Status: DC | PRN
  Administered 2022-09-14: 100 mg via INTRAVENOUS

## 2022-09-14 MED ORDER — FENTANYL CITRATE (PF) 100 MCG/2ML IJ SOLN
25.0000 ug | INTRAMUSCULAR | Status: DC | PRN
  Administered 2022-09-14: 25 ug via INTRAVENOUS

## 2022-09-14 MED ORDER — ONDANSETRON HCL 4 MG/2ML IJ SOLN
INTRAMUSCULAR | Status: DC | PRN
  Administered 2022-09-14: 4 mg via INTRAVENOUS

## 2022-09-14 MED ORDER — FENTANYL CITRATE (PF) 100 MCG/2ML IJ SOLN
INTRAMUSCULAR | Status: AC
  Filled 2022-09-14: qty 2

## 2022-09-14 MED ORDER — ONDANSETRON HCL 4 MG/2ML IJ SOLN
INTRAMUSCULAR | Status: AC
  Filled 2022-09-14: qty 2

## 2022-09-14 MED ORDER — LACTATED RINGERS IV SOLN: INTRAVENOUS | Status: DC

## 2022-09-14 MED ORDER — PROPOFOL 10 MG/ML IV BOLUS
INTRAVENOUS | Status: AC
  Filled 2022-09-14: qty 20

## 2022-09-14 MED ORDER — ROCURONIUM BROMIDE 100 MG/10ML IV SOLN
INTRAVENOUS | Status: DC | PRN
  Administered 2022-09-14: 30 mg via INTRAVENOUS

## 2022-09-14 MED ORDER — SUGAMMADEX SODIUM 200 MG/2ML IV SOLN
INTRAVENOUS | Status: DC | PRN
  Administered 2022-09-14: 200 mg via INTRAVENOUS

## 2022-09-14 MED ORDER — PROPOFOL 10 MG/ML IV BOLUS
INTRAVENOUS | Status: DC | PRN
  Administered 2022-09-14: 20 mg via INTRAVENOUS
  Administered 2022-09-14: 100 mg via INTRAVENOUS

## 2022-09-14 MED ORDER — FENTANYL CITRATE (PF) 100 MCG/2ML IJ SOLN
INTRAMUSCULAR | Status: DC | PRN
  Administered 2022-09-14 (×2): 50 ug via INTRAVENOUS

## 2022-09-14 MED ORDER — SUCCINYLCHOLINE CHLORIDE 200 MG/10ML IV SOSY
PREFILLED_SYRINGE | INTRAVENOUS | Status: AC
  Filled 2022-09-14: qty 10

## 2022-09-14 MED ORDER — CEPHALEXIN 250 MG PO CAPS: 250.0000 mg | ORAL_CAPSULE | Freq: Every day | ORAL | 0 refills | Status: AC

## 2022-09-14 MED ORDER — EPHEDRINE SULFATE-NACL 50-0.9 MG/10ML-% IV SOSY
PREFILLED_SYRINGE | INTRAVENOUS | Status: DC | PRN
  Administered 2022-09-14: 15 mg via INTRAVENOUS

## 2022-09-14 MED ORDER — ROCURONIUM BROMIDE 10 MG/ML (PF) SYRINGE
PREFILLED_SYRINGE | INTRAVENOUS | Status: AC
  Filled 2022-09-14: qty 10

## 2022-09-14 MED ORDER — CEFAZOLIN SODIUM-DEXTROSE 2-4 GM/100ML-% IV SOLN
2.0000 g | Freq: Once | INTRAVENOUS | Status: AC
  Administered 2022-09-14: 2 g via INTRAVENOUS

## 2022-09-14 MED ORDER — SODIUM CHLORIDE 0.9 % IR SOLN
Status: DC | PRN
  Administered 2022-09-14: 3000 mL via INTRAVESICAL

## 2022-09-14 MED ORDER — ONDANSETRON HCL 4 MG/2ML IJ SOLN: 4.0000 mg | Freq: Once | INTRAMUSCULAR | Status: DC | PRN

## 2022-09-14 MED ORDER — LIDOCAINE HCL (PF) 2 % IJ SOLN
INTRAMUSCULAR | Status: AC
  Filled 2022-09-14: qty 5

## 2022-09-14 MED ORDER — OXYCODONE HCL 5 MG/5ML PO SOLN: 5.0000 mg | Freq: Once | ORAL | Status: DC | PRN

## 2022-09-14 MED ORDER — CEFAZOLIN SODIUM-DEXTROSE 2-4 GM/100ML-% IV SOLN
INTRAVENOUS | Status: AC
  Filled 2022-09-14: qty 100

## 2022-09-14 MED ORDER — OXYCODONE HCL 5 MG PO TABS: 5.0000 mg | ORAL_TABLET | Freq: Once | ORAL | Status: DC | PRN

## 2022-09-14 MED ORDER — LIDOCAINE 2% (20 MG/ML) 5 ML SYRINGE
INTRAMUSCULAR | Status: DC | PRN
  Administered 2022-09-14: 100 mg via INTRAVENOUS

## 2022-09-14 MED ORDER — TAMSULOSIN HCL 0.4 MG PO CAPS: 0.4000 mg | ORAL_CAPSULE | Freq: Every day | ORAL | 3 refills | Status: AC

## 2022-09-14 MED ORDER — EPHEDRINE 5 MG/ML INJ
INTRAVENOUS | Status: AC
  Filled 2022-09-14: qty 5

## 2022-09-14 MED ORDER — IOHEXOL 300 MG/ML  SOLN
INTRAMUSCULAR | Status: DC | PRN
  Administered 2022-09-14: 2 mL

## 2022-09-14 SURGICAL SUPPLY — 27 items
BAG DRAIN URO-CYSTO SKYTR STRL (DRAIN) ×2 IMPLANT
BAG DRN RND TRDRP ANRFLXCHMBR (UROLOGICAL SUPPLIES)
BAG DRN UROCATH (DRAIN) ×2
BAG URINE DRAIN 2000ML AR STRL (UROLOGICAL SUPPLIES) IMPLANT
BALLN NEPHROSTOMY (BALLOONS) ×2
BALLOON NEPHROSTOMY (BALLOONS) IMPLANT
CATH FOLEY 2W COUNCIL 20FR 5CC (CATHETERS) IMPLANT
CATH FOLEY 2WAY SLVR  5CC 18FR (CATHETERS)
CATH FOLEY 2WAY SLVR 5CC 18FR (CATHETERS) IMPLANT
CLOTH BEACON ORANGE TIMEOUT ST (SAFETY) ×2 IMPLANT
ELECT REM PT RETURN 9FT ADLT (ELECTROSURGICAL) ×2
ELECTRODE REM PT RTRN 9FT ADLT (ELECTROSURGICAL) ×2 IMPLANT
FIBER LASER FLEXIVA 365 (UROLOGICAL SUPPLIES) IMPLANT
GLOVE BIO SURGEON STRL SZ 6.5 (GLOVE) ×2 IMPLANT
GOWN STRL REUS W/TWL LRG LVL3 (GOWN DISPOSABLE) ×2 IMPLANT
GUIDEWIRE STR DUAL SENSOR (WIRE) IMPLANT
HOLDER FOLEY CATH W/STRAP (MISCELLANEOUS) IMPLANT
IV NS IRRIG 3000ML ARTHROMATIC (IV SOLUTION) ×2 IMPLANT
KIT TURNOVER CYSTO (KITS) ×2 IMPLANT
LOOP CUT BIPOLAR 24F LRG (ELECTROSURGICAL) IMPLANT
MANIFOLD NEPTUNE II (INSTRUMENTS) ×2 IMPLANT
PACK CYSTO (CUSTOM PROCEDURE TRAY) ×2 IMPLANT
SLEEVE SCD COMPRESS KNEE MED (STOCKING) ×2 IMPLANT
SYR TOOMEY IRRIG 70ML (MISCELLANEOUS) ×2
SYRINGE TOOMEY IRRIG 70ML (MISCELLANEOUS) ×2 IMPLANT
TUBE CONNECTING 12X1/4 (SUCTIONS) ×2 IMPLANT
TUBING UROLOGY SET (TUBING) ×2 IMPLANT

## 2022-09-14 NOTE — Progress Notes (Signed)
Phase II PACU Nursing Note: Daughter at bedside for DC teaching and to rec instructions post surgical procedure today. Client resides at an Assisted Living facility within the area. Several issues were addressed to ensure client has care he requires. Daughter states that transportation from the facility will be bringing client to follow up appt and a family member will be here to be with him. Daughter is able to obtain Rx and provide them to her father at the Assisted Living facility and client understands how to take his Abx over the next 10 days. Daughter states he can perform this without difficulty. The Assisted Living facility staff does assist the client with ADLs and understands how to manage a urinary drainage system. Daughter stated she was going by the Assisted Living facility to share with them his AVS, medication that he will be taking (ABX and Flomax), also inform them he now has a indwelling urinary device. Client usually uses his wheelchair, but does have a walker as well, Daughter states the staff assist him with both devices and understands he is a High Fall Risk client. Opportunity provided several times to both the daughter and client in regards to questions or concerns prior to DC from [facility]. Client was assisted with getting dressed by this RN and assisted to wc. Client and daughter was escorted to exit to transportation [place].

## 2022-09-14 NOTE — Interval H&P Note (Signed)
History and Physical Interval Note: Discussed possibility of intravesical gemcitabine as well as laser of prostatic urethral calculus.  09/14/2022 11:29 AM  Jeffrey Porter  has presented today for surgery, with the diagnosis of BLADDER MASS.  The various methods of treatment have been discussed with the patient and family. After consideration of risks, benefits and other options for treatment, the patient has consented to  Procedure(s) with comments: TRANSURETHRAL RESECTION OF BLADDER TUMOR (TURBT) (N/A) - 45 MINUTES as a surgical intervention.  The patient's history has been reviewed, patient examined, no change in status, stable for surgery.  I have reviewed the patient's chart and labs.  Questions were answered to the patient's satisfaction.     Brande Uncapher D Floria Brandau

## 2022-09-14 NOTE — Anesthesia Preprocedure Evaluation (Addendum)
Anesthesia Evaluation  Patient identified by MRN, date of birth, ID band Patient awake    Reviewed: Allergy & Precautions, H&P , NPO status , Patient's Chart, lab work & pertinent test results  Airway Mallampati: II  TM Distance: >3 FB Neck ROM: Full    Dental  (+) Partial Upper   Pulmonary former smoker   Pulmonary exam normal breath sounds clear to auscultation       Cardiovascular hypertension, Normal cardiovascular exam Rhythm:Regular Rate:Normal     Neuro/Psych negative neurological ROS  negative psych ROS   GI/Hepatic negative GI ROS, Neg liver ROS,,,  Endo/Other  negative endocrine ROS    Renal/GU negative Renal ROS  negative genitourinary   Musculoskeletal negative musculoskeletal ROS (+)    Abdominal   Peds negative pediatric ROS (+)  Hematology negative hematology ROS (+)   Anesthesia Other Findings   Reproductive/Obstetrics negative OB ROS                             Anesthesia Physical Anesthesia Plan  ASA: 2  Anesthesia Plan: General   Post-op Pain Management: Minimal or no pain anticipated   Induction: Intravenous  PONV Risk Score and Plan: 2 and Ondansetron, Dexamethasone and Treatment may vary due to age or medical condition  Airway Management Planned: LMA  Additional Equipment:   Intra-op Plan:   Post-operative Plan: Extubation in OR  Informed Consent: I have reviewed the patients History and Physical, chart, labs and discussed the procedure including the risks, benefits and alternatives for the proposed anesthesia with the patient or authorized representative who has indicated his/her understanding and acceptance.     Dental advisory given  Plan Discussed with: CRNA and Surgeon  Anesthesia Plan Comments:        Anesthesia Quick Evaluation

## 2022-09-14 NOTE — Transfer of Care (Signed)
Immediate Anesthesia Transfer of Care Note  Patient: Jeffrey Porter  Procedure(s) Performed: TRANSURETHRAL RESECTION OF BLADDER TUMOR (TURBT) HOLMIUM LASER APPLICATION OF PROSTATIC URETHRAL STONE Balloon dilation wire-guided  Patient Location: PACU  Anesthesia Type:General  Level of Consciousness: awake and patient cooperative  Airway & Oxygen Therapy: Patient Spontanous Breathing and Patient connected to nasal cannula oxygen  Post-op Assessment: Report given to RN and Post -op Vital signs reviewed and stable  Post vital signs: Reviewed  Last Vitals:  Vitals Value Taken Time  BP 157/75 09/14/22 1256  Temp 36.5 C 09/14/22 1256  Pulse 93 09/14/22 1300  Resp 18 09/14/22 1300  SpO2 99 % 09/14/22 1300  Vitals shown include unvalidated device data.  Last Pain:  Vitals:   09/14/22 1049  TempSrc: Oral  PainSc: 0-No pain      Patients Stated Pain Goal: 5 (09/14/22 1049)  Complications: No notable events documented.

## 2022-09-14 NOTE — Anesthesia Procedure Notes (Signed)
Procedure Name: Intubation Date/Time: 09/14/2022 12:08 PM  Performed by: Bishop Limbo, CRNAPre-anesthesia Checklist: Patient identified, Emergency Drugs available, Suction available and Patient being monitored Patient Re-evaluated:Patient Re-evaluated prior to induction Oxygen Delivery Method: Circle System Utilized Preoxygenation: Pre-oxygenation with 100% oxygen Induction Type: IV induction Ventilation: Mask ventilation without difficulty Laryngoscope Size: Mac and 3 Grade View: Grade I Tube type: Oral Tube size: 7.0 mm Number of attempts: 1 Airway Equipment and Method: Stylet Placement Confirmation: ETT inserted through vocal cords under direct vision, positive ETCO2 and breath sounds checked- equal and bilateral Secured at: 22 cm Tube secured with: Tape Dental Injury: Teeth and Oropharynx as per pre-operative assessment

## 2022-09-14 NOTE — Op Note (Signed)
Operative Note  Preoperative diagnosis:  1.  Bladder tumor 2.  Urethral stricture 3.  Prostatic urethral calculus  Postoperative diagnosis: 1.  Bladder tumor 2.  Urethral stricture 3.  Prostatic urethral calculus  Procedure(s): 1.  Cystoscopy with laser lithotripsy of ureteral calculus 2.  Urethral balloon dilation 3.  Transurethral resection of bladder tumor 1.5cm)  Surgeon: Kasandra Knudsen, MD  Assistants:  None  Anesthesia:  General  Complications:  None  EBL: Minimal  Specimens: 1.  Bladder tumor  Drains/Catheters: 1.  20 French council tip Foley  Intraoperative findings:   Normal anterior urethra Membranous/prostatic urethra with calcification creating obstruction/stricture Two subcentimeter papillary bladder tumors lateral to the right ureteral orifice 4.  Bilateral ureteral orifices seen at start and end the case  Indication:  Jeffrey Porter is a 89 y.o. 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  Patient also noted to have calcification in his urethra.  Description of procedure:  After informed consent was obtained, the patient was admitted to the operating room.  He was placed in the supine position and anesthesia was used and antibiotics administered.  Patient was then repositioned in the dorsolithotomy position.  He was prepped and draped in the usual sterile fashion and timeout was performed.  Attempts at placing the resectoscope using the visual obturator were unsuccessful due to the obstruction/stricture and membranous/prostatic urethra.  The resectoscope was removed and a 23 French rigid cystoscope was placed in the urethral meatus and advanced to the area of concern.  A 0.38 sensor wire was then advanced through the cystoscope and into the bladder.  Next laser lithotripsy with a 300 m holmium laser fiber was used to break up the calcification on the  anterior wall.  It is unclear if this was caused by the brachytherapy seeds.  After lasering continued in order to see the lumen proximal open Ultrex balloon dilator was then placed over the wire to span the strictured area.  It was inflated to 12 mmHg under fluoroscopic guidance.  After it was held in place for 1 minute it was deflated and removed.  Next the resectoscope was then replaced using the visual obturator was able to traverse this area and into the bladder.    The 2 previously seen papillary bladder masses were again seen lateral to the right ureteral orifice.  The bipolar resectoscope was assembled with the working element and was used to remove these masses.  Hemostasis was then achieved with bipolar electrocautery.  Hemostasis was adequate with irrigant turned off.  Further examination of the bladder revealed no other masses or abnormalities.  The ureteral orifices were seen at the start end of case and uninvolved.  The resectoscope was removed.  Next the cystoscope was replaced and the stricture was noted to be wide open however there was still some calcification and scar tissue present.  This was left in place and a 0.38 sensor wire was advanced through the cystoscope into the bladder.  The cystoscope was removed.  A 20 French council tip Foley catheter was then placed over the wire and advanced to the bladder until it was hubbed.  The wire was removed and the Foley was inflated with 10 cc sterile water.  Patient emerged from anesthesia and transferred the PACU in stable condition.  Plan: He will be discharged home with a Foley catheter and will be removed in the office next week.

## 2022-09-14 NOTE — Discharge Instructions (Addendum)
Transurethral Resection of Bladder Tumor (TURBT)   Definition:  Transurethral Resection of the Bladder Tumor is a surgical procedure used to diagnose and remove tumors within the bladder. TURBT is the most common treatment for early stage bladder cancer.  General instructions:     Your recent bladder surgery requires very little post hospital care but some definite precautions.  Despite the fact that no skin incisions were used, the area around the bladder incisions are raw and covered with scabs to promote healing and prevent bleeding. Certain precautions are needed to insure that the scabs are not disturbed over the next 2-4 weeks while the healing proceeds.  Because the raw surface inside your bladder and the irritating effects of urine you may expect frequency of urination and/or urgency (a stronger desire to urinate) and perhaps even getting up at night more often. This will usually resolve or improve slowly over the healing period. You may see some blood in your urine over the first 6 weeks. Do not be alarmed, even if the urine was clear for a while. Get off your feet and drink lots of fluids until clearing occurs. If you start to pass clots or don't improve call us.  Catheter: (If you are discharged with a catheter.)  1. Keep your catheter secured to your leg at all times with tape or the supplied strap. 2. You may experience leakage of urine around your catheter- as long as the  catheter continues to drain, this is normal.  If your catheter stops draining  go to the ER. 3. You may also have blood in your urine, even after it has been clear for  several days; you may even pass some small blood clots or other material.  This  is normal as well.  If this happens, sit down and drink plenty of water to help  make urine to flush out your bladder.  If the blood in your urine becomes worse  after doing this, contact our office or return to the ER. 4. You may use the leg bag (small bag)  during the day, but use the large bag at  night.  Diet:  You may return to your normal diet immediately. Because of the raw surface of your bladder, alcohol, spicy foods, foods high in acid and drinks with caffeine may cause irritation or frequency and should be used in moderation. To keep your urine flowing freely and avoid constipation, drink plenty of fluids during the day (8-10 glasses). Tip: Avoid cranberry juice because it is very acidic.  Activity:  Your physical activity doesn't need to be restricted. However, if you are very active, you may see some blood in the urine. We suggest that you reduce your activity under the circumstances until the bleeding has stopped.  Bowels:  It is important to keep your bowels regular during the postoperative period. Straining with bowel movements can cause bleeding. A bowel movement every other day is reasonable. Use a mild laxative if needed, such as milk of magnesia 2-3 tablespoons, or 2 Dulcolax tablets. Call if you continue to have problems. If you had been taking narcotics for pain, before, during or after your surgery, you may be constipated. Take a laxative if necessary.    Medication:  You should resume your pre-surgery medications unless told not to. In addition you may be given an antibiotic to prevent or treat infection. Antibiotics are not always necessary. All medication should be taken as prescribed until the bottles are finished unless you are having   an unusual reaction to one of the drugs.    Post Anesthesia Home Care Instructions  Activity: Get plenty of rest for the remainder of the day. A responsible individual must stay with you for 24 hours following the procedure.  For the next 24 hours, DO NOT: -Drive a car -Operate machinery -Drink alcoholic beverages -Take any medication unless instructed by your physician -Make any legal decisions or sign important papers.  Meals: Start with liquid foods such as gelatin or soup.  Progress to regular foods as tolerated. Avoid greasy, spicy, heavy foods. If nausea and/or vomiting occur, drink only clear liquids until the nausea and/or vomiting subsides. Call your physician if vomiting continues.  Special Instructions/Symptoms: Your throat may feel dry or sore from the anesthesia or the breathing tube placed in your throat during surgery. If this causes discomfort, gargle with warm salt water. The discomfort should disappear within 24 hours.    

## 2022-09-14 NOTE — Anesthesia Postprocedure Evaluation (Signed)
Anesthesia Post Note  Patient: Jeffrey Porter  Procedure(s) Performed: TRANSURETHRAL RESECTION OF BLADDER TUMOR (TURBT) HOLMIUM LASER APPLICATION OF PROSTATIC URETHRAL STONE Balloon dilation wire-guided     Patient location during evaluation: PACU Anesthesia Type: General Level of consciousness: awake and alert Pain management: pain level controlled Vital Signs Assessment: post-procedure vital signs reviewed and stable Respiratory status: spontaneous breathing, nonlabored ventilation, respiratory function stable and patient connected to nasal cannula oxygen Cardiovascular status: blood pressure returned to baseline and stable Postop Assessment: no apparent nausea or vomiting Anesthetic complications: no  No notable events documented.  Last Vitals:  Vitals:   09/14/22 1315 09/14/22 1330  BP: (!) 145/61 (!) 142/54  Pulse: 88 88  Resp: 18 14  Temp:    SpO2: 96% 93%    Last Pain:  Vitals:   09/14/22 1330  TempSrc:   PainSc: 5                  Neeva Trew S

## 2022-09-15 LAB — SURGICAL PATHOLOGY

## 2022-09-16 ENCOUNTER — Encounter (HOSPITAL_BASED_OUTPATIENT_CLINIC_OR_DEPARTMENT_OTHER): Payer: Self-pay | Admitting: Urology

## 2022-09-19 ENCOUNTER — Encounter (HOSPITAL_COMMUNITY): Payer: Self-pay

## 2022-09-19 ENCOUNTER — Other Ambulatory Visit: Payer: Self-pay

## 2022-09-19 ENCOUNTER — Emergency Department (HOSPITAL_COMMUNITY)
Admission: EM | Admit: 2022-09-19 | Discharge: 2022-09-19 | Disposition: A | Discharge status: Skilled Nursing Facility | Payer: Medicare HMO | Attending: Emergency Medicine | Admitting: Emergency Medicine

## 2022-09-19 DIAGNOSIS — Z7401 Bed confinement status: Secondary | ICD-10-CM | POA: Diagnosis not present

## 2022-09-19 DIAGNOSIS — R310 Gross hematuria: Secondary | ICD-10-CM | POA: Diagnosis not present

## 2022-09-19 DIAGNOSIS — R319 Hematuria, unspecified: Secondary | ICD-10-CM | POA: Diagnosis not present

## 2022-09-19 LAB — CBC WITH DIFFERENTIAL/PLATELET
Abs Immature Granulocytes: 0.02 10*3/uL (ref 0.00–0.07)
Basophils Absolute: 0 10*3/uL (ref 0.0–0.1)
Basophils Relative: 1 %
Eosinophils Absolute: 0.1 10*3/uL (ref 0.0–0.5)
Eosinophils Relative: 2 %
HCT: 34 % — ABNORMAL LOW (ref 39.0–52.0)
Hemoglobin: 11.7 g/dL — ABNORMAL LOW (ref 13.0–17.0)
Immature Granulocytes: 0 %
Lymphocytes Relative: 18 %
Lymphs Abs: 1.4 10*3/uL (ref 0.7–4.0)
MCH: 30.3 pg (ref 26.0–34.0)
MCHC: 34.4 g/dL (ref 30.0–36.0)
MCV: 88.1 fL (ref 80.0–100.0)
Monocytes Absolute: 0.5 10*3/uL (ref 0.1–1.0)
Monocytes Relative: 6 %
Neutro Abs: 6 10*3/uL (ref 1.7–7.7)
Neutrophils Relative %: 73 %
Platelets: 293 10*3/uL (ref 150–400)
RBC: 3.86 MIL/uL — ABNORMAL LOW (ref 4.22–5.81)
RDW: 13 % (ref 11.5–15.5)
WBC: 8.1 10*3/uL (ref 4.0–10.5)
nRBC: 0 % (ref 0.0–0.2)

## 2022-09-19 LAB — BASIC METABOLIC PANEL
Anion gap: 9 (ref 5–15)
BUN: 15 mg/dL (ref 8–23)
CO2: 24 mmol/L (ref 22–32)
Calcium: 8.9 mg/dL (ref 8.9–10.3)
Chloride: 98 mmol/L (ref 98–111)
Creatinine, Ser: 0.66 mg/dL (ref 0.61–1.24)
GFR, Estimated: >60 mL/min (ref >60)
Glucose, Bld: 107 mg/dL — ABNORMAL HIGH (ref 70–99)
Potassium: 3.8 mmol/L (ref 3.5–5.1)
Sodium: 131 mmol/L — ABNORMAL LOW (ref 135–145)

## 2022-09-19 NOTE — ED Triage Notes (Addendum)
Patient BIB [facility] from [facility]. Had a foley placed Tuesday and has had blood with clots in it since then. Has bladder pain. Not on a blood thinner. Catheter was drained before patient arrived.

## 2022-09-19 NOTE — ED Notes (Signed)
Catheter irrigated per Dr. Christoper Fabian order. Clots came out of catheter. Urine output pink tinged in color.

## 2022-09-19 NOTE — Discharge Instructions (Signed)
Please irrigate the patient's Foley catheter 3 times a day or whenever it seems to have increased clots.  If the urine clears and there is no blood, you can stop doing the irrigation.  Follow-up with [facility] as scheduled this week.  Return to the emergency room if you have any worsening symptoms.

## 2022-09-19 NOTE — ED Notes (Signed)
Ival Bible called [facility] for pt transport.

## 2022-09-19 NOTE — ED Provider Notes (Signed)
[place] EMERGENCY DEPARTMENT AT [facility] Provider Note   CSN: [id number] Arrival date & time: 09/19/22  1533     History  Chief Complaint  Patient presents with   Hematuria    Jeffrey Porter is a 89 y.o. male.  Patient is a 89 year old male who presents with gross hematuria.  He had a recent urologic procedure.  He had a bladder tumor resected on May 21.  He also had a prostate urethral calculus removal and his urethral stricture dilated at that time.  He has an indwelling Foley catheter since the procedure.  He supposed to follow-up with urology next week.  He said he initially had some bleeding that cleared and recently it started bleeding again.  He says at times it has clots and he will have to push them out.  He denies abdominal pain.  No nausea or vomiting.  No fevers.  He is not on anticoagulants.       Home Medications Prior to Admission medications   Medication Sig Start Date End Date Taking? Authorizing Provider  acetaminophen (TYLENOL) 500 MG tablet Take 500 mg by mouth daily as needed for mild pain or headache.    [provider]  BIOTIN PO Take 1 tablet by mouth daily. Patient not taking    [provider]  Carbidopa-Levodopa ER (SINEMET CR) 25-100 MG tablet controlled release TAKE 1 TABLET BY MOUTH THREE TIMES A DAY Patient not taking: Reported on 06/18/2021 03/13/18   Tat, Octaviano Batty, DO  cephALEXin (KEFLEX) 250 MG capsule Take 1 capsule (250 mg total) by mouth daily for 10 days. 09/14/22 09/24/22  Noel Christmas, MD  Cholecalciferol (VITAMIN D3 SUPER STRENGTH) 50 MCG (2000 UT) CAPS Take 2,000 Units by mouth daily.    [provider]  Cyanocobalamin (VITAMIN B-12 PO) Take 1 tablet by mouth daily.    [provider]  latanoprost (XALATAN) 0.005 % ophthalmic solution Place 1 drop into both eyes at bedtime.     [provider]  Magnesium 250 MG TABS Take 250 mg by mouth daily.    [provider]   Multiple Vitamins-Minerals (CENTRUM SILVER 50+MEN) TABS Take 1 tablet by mouth daily with breakfast.    [provider]  niacin 100 MG tablet Take 100 mg by mouth at bedtime.    [provider]  tamsulosin (FLOMAX) 0.4 MG CAPS capsule Take 1 capsule (0.4 mg total) by mouth daily. 08/25/22 09/24/22  Gerhard Munch, MD  tamsulosin (FLOMAX) 0.4 MG CAPS capsule Take 1 capsule (0.4 mg total) by mouth at bedtime. 09/14/22   Noel Christmas, MD  zinc gluconate 50 MG tablet Take 50 mg by mouth daily.    [provider]      Allergies    Timolol maleate    Review of Systems   Review of Systems  Constitutional:  Negative for fatigue and fever.  Gastrointestinal:  Positive for constipation. Negative for abdominal pain, diarrhea, nausea and vomiting.  Genitourinary:  Positive for hematuria and penile pain. Negative for penile swelling.  Skin:  Negative for rash and wound.    Physical Exam Updated Vital Signs BP (!) 104/44   Pulse 74   Temp 98.4 F (36.9 C)   Resp 16   Ht 5\' 2"  (1.575 m)   Wt 66 kg   SpO2 98%   BMI 26.61 kg/m  Physical Exam Constitutional:      Appearance: He is well-developed.  HENT:     Head: Normocephalic  and atraumatic.  Eyes:     Pupils: Pupils are equal, round, and reactive to light.  Cardiovascular:     Rate and Rhythm: Normal rate and regular rhythm.     Heart sounds: Normal heart sounds.  Pulmonary:     Effort: Pulmonary effort is normal. No respiratory distress.     Breath sounds: Normal breath sounds. No wheezing or rales.  Chest:     Chest wall: No tenderness.  Abdominal:     General: Bowel sounds are normal.     Palpations: Abdomen is soft.     Tenderness: There is no abdominal tenderness. There is no guarding or rebound.  Genitourinary:    Comments: Foley catheter in place, no bleeding around insertion site.  +gross hematuria in bag Musculoskeletal:        General: Normal range of motion.     Cervical back: Normal  range of motion and neck supple.  Lymphadenopathy:     Cervical: No cervical adenopathy.  Skin:    General: Skin is warm and dry.     Findings: No rash.  Neurological:     Mental Status: He is alert and oriented to person, place, and time.     ED Results / Procedures / Treatments   Labs (all labs ordered are listed, but only abnormal results are displayed) Labs Reviewed  BASIC METABOLIC PANEL - Abnormal; Notable for the following components:      Result Value   Sodium 131 (*)    Glucose, Bld 107 (*)    All other components within normal limits  CBC WITH DIFFERENTIAL/PLATELET - Abnormal; Notable for the following components:   RBC 3.86 (*)    Hemoglobin 11.7 (*)    HCT 34.0 (*)    All other components within normal limits    EKG None  Radiology No results found.  Procedures Procedures    Medications Ordered in ED Medications - No data to display  ED Course/ Medical Decision Making/ A&P                             Medical Decision Making Amount and/or Complexity of Data Reviewed Labs: ordered.   Patient presents as hematuria following a recent bladder procedure with a tumor removal, lithotripsy on a prostate stone and urethral dilatation.  Currently he has blood-tinged urine.  No clots are noted.  However patient's bladder was irrigated here and there was some clots that came out although no ongoing clots.  There is some ongoing blood-tinged urine.  His hemoglobin appears stable.  I spoke with Dr. Liliane Shi with urology.  He does not recommend any further imaging.  He recommends patient be discharged with ongoing irrigation by the nursing facility.  Patient has a follow-up appoint with urology next week.  He is currently on antibiotics following his surgery.  He does not have a fever or other indications to send his urine for urinalysis.  I did speak with the staff at [facility], the patient's assisted living facility regarding the bladder irrigation.  Instructions were  also put on his discharge papers.  I did attempt to call both of the patient's daughters but they did not answer the phone.  Patient was discharged home in good condition.  Return precautions were given.  Final Clinical Impression(s) / ED Diagnoses Final diagnoses:  Gross hematuria    Rx / DC Orders ED Discharge Orders     None  Rolan Bucco, MD 09/19/22 2113

## 2022-09-19 NOTE — ED Notes (Signed)
Patient had 2 masses removed 1 week ago from bladder. Still has a kidney stone embedded in his urethra.   [phone] Raynelle Fanning, patients daughter was given an update.

## 2022-09-23 ENCOUNTER — Emergency Department (HOSPITAL_COMMUNITY)
Admission: EM | Admit: 2022-09-23 | Discharge: 2022-09-23 | Disposition: A | Discharge status: Home / Self Care | Payer: Medicare HMO | Attending: Emergency Medicine | Admitting: Emergency Medicine

## 2022-09-23 ENCOUNTER — Encounter (HOSPITAL_COMMUNITY): Payer: Self-pay

## 2022-09-23 ENCOUNTER — Other Ambulatory Visit: Payer: Self-pay

## 2022-09-23 DIAGNOSIS — T83091A Other mechanical complication of indwelling urethral catheter, initial encounter: Secondary | ICD-10-CM | POA: Insufficient documentation

## 2022-09-23 DIAGNOSIS — R319 Hematuria, unspecified: Secondary | ICD-10-CM | POA: Diagnosis not present

## 2022-09-23 DIAGNOSIS — G8929 Other chronic pain: Secondary | ICD-10-CM | POA: Diagnosis not present

## 2022-09-23 DIAGNOSIS — Y732 Prosthetic and other implants, materials and accessory gastroenterology and urology devices associated with adverse incidents: Secondary | ICD-10-CM | POA: Insufficient documentation

## 2022-09-23 DIAGNOSIS — R339 Retention of urine, unspecified: Secondary | ICD-10-CM | POA: Diagnosis not present

## 2022-09-23 DIAGNOSIS — I959 Hypotension, unspecified: Secondary | ICD-10-CM | POA: Diagnosis not present

## 2022-09-23 DIAGNOSIS — R310 Gross hematuria: Secondary | ICD-10-CM

## 2022-09-23 DIAGNOSIS — R338 Other retention of urine: Secondary | ICD-10-CM | POA: Insufficient documentation

## 2022-09-23 DIAGNOSIS — Z7401 Bed confinement status: Secondary | ICD-10-CM | POA: Diagnosis not present

## 2022-09-23 LAB — URINALYSIS, MICROSCOPIC (REFLEX): RBC / HPF: >50 RBC/hpf (ref 0–5)

## 2022-09-23 LAB — I-STAT CHEM 8, ED
BUN: 10 mg/dL (ref 8–23)
Calcium, Ion: 1.26 mmol/L (ref 1.15–1.40)
Chloride: 95 mmol/L — ABNORMAL LOW (ref 98–111)
Creatinine, Ser: 0.6 mg/dL — ABNORMAL LOW (ref 0.61–1.24)
Glucose, Bld: 99 mg/dL (ref 70–99)
HCT: 37 % — ABNORMAL LOW (ref 39.0–52.0)
Hemoglobin: 12.6 g/dL — ABNORMAL LOW (ref 13.0–17.0)
Potassium: 4.2 mmol/L (ref 3.5–5.1)
Sodium: 133 mmol/L — ABNORMAL LOW (ref 135–145)
TCO2: 28 mmol/L (ref 22–32)

## 2022-09-23 LAB — URINALYSIS, ROUTINE W REFLEX MICROSCOPIC

## 2022-09-23 MED ORDER — SODIUM CHLORIDE 0.9 % IR SOLN: 3000.0000 mL | Status: DC

## 2022-09-23 NOTE — ED Notes (Signed)
Catheter flushed with 137ml's warm water without complication, PA made aware.

## 2022-09-23 NOTE — ED Provider Notes (Signed)
EMERGENCY DEPARTMENT AT [facility] Provider Note   CSN: [id number] Arrival date & time: 09/23/22  0143     History  No chief complaint on file.   Jeffrey Porter is a 89 y.o. male.  The history is provided by the patient, medical records and the EMS personnel. No language interpreter was used.     89 year old male significant history of bladder tumor that was resected on May 21 as well as prostatic urethral calculus removal and his urethral stricture dilated at the same time with an indwelling Foley catheter presenting today with concern of catheter complication.  History is a bit difficult to obtain from patient.  Tonight, patient endorsed having pain at his urethral meatus region.  States it was quite painful and there was some leakage of urine around his penis but now he mentioned that pain seemed to subside.  He has noticed blood clots in his urine for the past several days.  He does not endorse any fever nausea or vomiting.  Denies abdominal pain.  He is not on any blood thinner medication.  Home Medications Prior to Admission medications   Medication Sig Start Date End Date Taking? Authorizing Provider  acetaminophen (TYLENOL) 500 MG tablet Take 500 mg by mouth daily as needed for mild pain or headache.    [provider]  BIOTIN PO Take 1 tablet by mouth daily. Patient not taking    [provider]  Carbidopa-Levodopa ER (SINEMET CR) 25-100 MG tablet controlled release TAKE 1 TABLET BY MOUTH THREE TIMES A DAY Patient not taking: Reported on 06/18/2021 03/13/18   Tat, Octaviano Batty, DO  cephALEXin (KEFLEX) 250 MG capsule Take 1 capsule (250 mg total) by mouth daily for 10 days. 09/14/22 09/24/22  Noel Christmas, MD  Cholecalciferol (VITAMIN D3 SUPER STRENGTH) 50 MCG (2000 UT) CAPS Take 2,000 Units by mouth daily.    [provider]  Cyanocobalamin (VITAMIN B-12 PO) Take 1 tablet by mouth daily.    [provider]  latanoprost  (XALATAN) 0.005 % ophthalmic solution Place 1 drop into both eyes at bedtime.     [provider]  Magnesium 250 MG TABS Take 250 mg by mouth daily.    [provider]  Multiple Vitamins-Minerals (CENTRUM SILVER 50+MEN) TABS Take 1 tablet by mouth daily with breakfast.    [provider]  niacin 100 MG tablet Take 100 mg by mouth at bedtime.    [provider]  tamsulosin (FLOMAX) 0.4 MG CAPS capsule Take 1 capsule (0.4 mg total) by mouth daily. 08/25/22 09/24/22  Gerhard Munch, MD  tamsulosin (FLOMAX) 0.4 MG CAPS capsule Take 1 capsule (0.4 mg total) by mouth at bedtime. 09/14/22   Noel Christmas, MD  zinc gluconate 50 MG tablet Take 50 mg by mouth daily.    [provider]      Allergies    Timolol maleate    Review of Systems   Review of Systems  All other systems reviewed and are negative.   Physical Exam Updated Vital Signs BP (!) 120/50   Pulse 68   Temp 98.2 F (36.8 C)   Resp 19   SpO2 98%  Physical Exam Vitals and nursing note reviewed.  Constitutional:      General: He is not in acute distress.    Appearance: He is well-developed.  HENT:     Head: Atraumatic.  Eyes:     Conjunctiva/sclera: Conjunctivae normal.  Abdominal:  Palpations: Abdomen is soft.     Tenderness: There is no abdominal tenderness.  Genitourinary:    Comments: Chaperone present during exam.  Uncircumcised penis with Foley in place.  Gross hematuria noted in Foley tubing and Foley bag.  Testicles nontender with normal lie Musculoskeletal:     Cervical back: Neck supple.  Skin:    Findings: No rash.  Neurological:     Mental Status: He is alert.     ED Results / Procedures / Treatments   Labs (all labs ordered are listed, but only abnormal results are displayed) Labs Reviewed  URINALYSIS, ROUTINE W REFLEX MICROSCOPIC - Abnormal; Notable for the following components:      Result Value   Color, Urine RED (*)    APPearance TURBID (*)     Glucose, UA   (*)    Value: TEST NOT REPORTED DUE TO COLOR INTERFERENCE OF URINE PIGMENT   Hgb urine dipstick   (*)    Value: TEST NOT REPORTED DUE TO COLOR INTERFERENCE OF URINE PIGMENT   Bilirubin Urine   (*)    Value: TEST NOT REPORTED DUE TO COLOR INTERFERENCE OF URINE PIGMENT   Ketones, ur   (*)    Value: TEST NOT REPORTED DUE TO COLOR INTERFERENCE OF URINE PIGMENT   Protein, ur   (*)    Value: TEST NOT REPORTED DUE TO COLOR INTERFERENCE OF URINE PIGMENT   Nitrite   (*)    Value: TEST NOT REPORTED DUE TO COLOR INTERFERENCE OF URINE PIGMENT   Leukocytes,Ua   (*)    Value: TEST NOT REPORTED DUE TO COLOR INTERFERENCE OF URINE PIGMENT   All other components within normal limits  URINALYSIS, MICROSCOPIC (REFLEX) - Abnormal; Notable for the following components:   Bacteria, UA FEW (*)    All other components within normal limits  I-STAT CHEM 8, ED - Abnormal; Notable for the following components:   Sodium 133 (*)    Chloride 95 (*)    Creatinine, Ser 0.60 (*)    Hemoglobin 12.6 (*)    HCT 37.0 (*)    All other components within normal limits    EKG None  Radiology No results found.  Procedures Procedures    Medications Ordered in ED Medications - No data to display   ED Course/ Medical Decision Making/ A&P                             Medical Decision Making Amount and/or Complexity of Data Reviewed Labs: ordered.  Risk Prescription drug management.   BP (!) 120/50   Pulse 68   Temp 98.2 F (36.8 C)   Resp 19   SpO2 98%   76:2 AM  89 year old male significant history of bladder tumor that was resected on May 21 as well as prostatic urethral calculus removal and his urethral stricture dilated at the same time with an indwelling Foley catheter presenting today with concern of catheter complication.  History is a bit difficult to obtain from patient.  Tonight, patient endorsed having pain at his urethral meatus region.  States it was quite painful and there was  some leakage of urine around his penis but now he mentioned that pain seemed to subside.  He has noticed blood clots in his urine for the past several days.  He does not endorse any fever nausea or vomiting.  Denies abdominal pain.  He is not on any blood thinner medication.  On  exam this is an elderly male laying in bed in no acute discomfort.  He has a tremulous speech which makes it difficult to understand.  His abdomen is soft nontender, his Foley is in place.  Gross hematuria noted in Foley tubing and in Foley bag.  Suspect he likely had a blood clot that obstructed his Foley catheter causing discomfort earlier and he is likely just passing this clot.  Will request for manual bladder irrigation at this time to avoid the need to exchange his Foley catheter.  2:41 AM Nurse was able to irrigate the Foley without any resistance.  Patient tolerates well.  Will continue to monitor.  5:06 AM Urine still shows gross hematuria therefore will initiate bladder irrigation.  I did reach out to the on-call urologist Dr. Pete Glatter who agrees.  5:39 AM Urologist Dr. Pete Glatter did see and evaluate patient and also perform some bladder irrigation removing small amount of clots.  He felt at this time patient can be discharged home as he has an appointment with the urologist tomorrow.  Does not think continuous bladder irrigation is indicated at this time.  Patient stable for discharge.  I have submitted a urine culture  Advanced imaging including bladder scan and or abdominal pelvis the scan considered but not performed.  Social determinant health including tobacco use.  At this time patient is stable to discharge.  He felt better.        Final Clinical Impression(s) / ED Diagnoses Final diagnoses:  Gross hematuria  Acute urinary retention    Rx / DC Orders ED Discharge Orders     None         Fayrene Helper, PA-C 09/23/22 0542    Nira Conn, MD 09/23/22 [phone]

## 2022-09-23 NOTE — Consult Note (Signed)
Urology Consult   Physician requesting consult: Fayrene Helper, PA-C  Reason for consult: Gross hematuria, catheter problem  History of Present Illness: Jeffrey Porter is a 89 y.o. male seen for evaluation of gross hematuria and problems with his Foley catheter.  He is status post cystoscopy with laser lithotripsy of urethral calculus, balloon dilation of urethral stricture, and transurethral resection of bladder tumor on 09/14/2022 by Dr. Arita Miss.  He was discharged home with a Foley catheter in place.  He has had gross hematuria since the procedure.  He presented to the emergency room early this morning with gross hematuria and leakage around the Foley catheter.  He was also having some pain in the urethra and lower abdomen.  The catheter was irrigated with return of some small clots.  His catheter is draining well with continued hematuria. He was seen in the emergency room on 09/19/2022 for a similar problem.  His catheter was irrigated and he was discharged.  Past Medical History:  Diagnosis Date   Baker's cyst of knee, left    Glaucoma    Hypertension    Neuromuscular disorder (HCC)    peripheral neuropathy   Prostate cancer [facility])     Past Surgical History:  Procedure Laterality Date   BALLOON DILATION N/A 08/03/2019   Procedure: BALLOON DILATION;  Surgeon: Sebastian Ache, MD;  Location: [facility];  Service: Urology;  Laterality: N/A;   CATARACT EXTRACTION     CYSTOSCOPY WITH RETROGRADE URETHROGRAM N/A 08/03/2019   Procedure: CYSTOSCOPY WITH RETROGRADE URETHROGRAM;  Surgeon: Sebastian Ache, MD;  Location: [facility];  Service: Urology;  Laterality: N/A;   HERNIA REPAIR     HOLMIUM LASER APPLICATION  09/14/2022   Procedure: HOLMIUM LASER APPLICATION OF PROSTATIC URETHRAL STONE;  Surgeon: Noel Christmas, MD;  Location: [facility];  Service: Urology;;   INSERTION BRACHYTHERAPY DEVICE     TRANSURETHRAL RESECTION OF BLADDER TUMOR N/A 09/14/2022   Procedure: TRANSURETHRAL RESECTION OF  BLADDER TUMOR (TURBT);  Surgeon: Noel Christmas, MD;  Location: [facility];  Service: Urology;  Laterality: N/A;  45 MINUTES    Medications:  Scheduled Meds: Continuous Infusions: PRN Meds:.  Allergies:  Allergies  Allergen Reactions   Timolol Maleate Other (See Comments)    Syncope     Family History  Problem Relation Age of Onset   Cancer Mother    Cancer Father     Social History:  reports that he has quit smoking. His smoking use included cigarettes. He has never used smokeless tobacco. He reports that he does not currently use alcohol after a past usage of about 1.0 standard drink of alcohol per week. He reports that he does not use drugs.  ROS: A complete review of systems was performed.  All systems are negative except for pertinent findings as noted.  Physical Exam:  Vital signs in last 24 hours: Temp:  [98.2 F (36.8 C)] 98.2 F (36.8 C) (05/30 0200) Pulse Rate:  [66-68] 66 (05/30 0400) Resp:  [17-19] 17 (05/30 0400) BP: (120-129)/(50-52) 129/52 (05/30 0400) SpO2:  [96 %-98 %] 96 % (05/30 0400) GENERAL APPEARANCE:  Well appearing, well developed, well nourished, NAD HEENT:  Atraumatic, normocephalic, oropharynx clear NECK:  Supple without lymphadenopathy or thyromegaly ABDOMEN:  Soft, non-tender, no masses EXTREMITIES:  Moves all extremities well, without clubbing, cyanosis, or edema NEUROLOGIC:  Alert and oriented x 3,  CN II-XII grossly intact MENTAL STATUS:  appropriate BACK:  Non-tender to palpation, No CVAT SKIN:  Warm, dry,  and intact GU:  foley in place draining light pink urine  Laboratory Data:  Recent Labs    09/23/22 0232  HGB 12.6*  HCT 37.0*    Recent Labs    09/23/22 0232  NA 133*  K 4.2  CL 95*  GLUCOSE 99  BUN 10  CREATININE 0.60*     Results for orders placed or performed during the hospital encounter of 09/23/22 (from the past 24 hour(s))  I-stat chem 8, ED     Status: Abnormal   Collection Time:  09/23/22  2:32 AM  Result Value Ref Range   Sodium 133 (L) 135 - 145 mmol/L   Potassium 4.2 3.5 - 5.1 mmol/L   Chloride 95 (L) 98 - 111 mmol/L   BUN 10 8 - 23 mg/dL   Creatinine, Ser 4.09 (L) 0.61 - 1.24 mg/dL   Glucose, Bld 99 70 - 99 mg/dL   Calcium, Ion 8.11 9.14 - 1.40 mmol/L   TCO2 28 22 - 32 mmol/L   Hemoglobin 12.6 (L) 13.0 - 17.0 g/dL   HCT 78.2 (L) 95.6 - 21.3 %  Urinalysis, Routine w reflex microscopic -Urine, Catheterized; Indwelling urinary catheter     Status: Abnormal   Collection Time: 09/23/22  5:03 AM  Result Value Ref Range   Color, Urine RED (A) YELLOW   APPearance TURBID (A) CLEAR   Specific Gravity, Urine  1.005 - 1.030    TEST NOT REPORTED DUE TO COLOR INTERFERENCE OF URINE PIGMENT   pH  5.0 - 8.0    TEST NOT REPORTED DUE TO COLOR INTERFERENCE OF URINE PIGMENT   Glucose, UA (A) NEGATIVE mg/dL    TEST NOT REPORTED DUE TO COLOR INTERFERENCE OF URINE PIGMENT   Hgb urine dipstick (A) NEGATIVE    TEST NOT REPORTED DUE TO COLOR INTERFERENCE OF URINE PIGMENT   Bilirubin Urine (A) NEGATIVE    TEST NOT REPORTED DUE TO COLOR INTERFERENCE OF URINE PIGMENT   Ketones, ur (A) NEGATIVE mg/dL    TEST NOT REPORTED DUE TO COLOR INTERFERENCE OF URINE PIGMENT   Protein, ur (A) NEGATIVE mg/dL    TEST NOT REPORTED DUE TO COLOR INTERFERENCE OF URINE PIGMENT   Nitrite (A) NEGATIVE    TEST NOT REPORTED DUE TO COLOR INTERFERENCE OF URINE PIGMENT   Leukocytes,Ua (A) NEGATIVE    TEST NOT REPORTED DUE TO COLOR INTERFERENCE OF URINE PIGMENT  Urinalysis, Microscopic (reflex)     Status: Abnormal   Collection Time: 09/23/22  5:03 AM  Result Value Ref Range   RBC / HPF >50 0 - 5 RBC/hpf   WBC, UA 0-5 0 - 5 WBC/hpf   Bacteria, UA FEW (A) NONE SEEN   Squamous Epithelial / HPF 0-5 0 - 5 /HPF   No results found for this or any previous visit (from the past 240 hour(s)).  Renal Function: Recent Labs    09/19/22 1621 09/23/22 0232  CREATININE 0.66 0.60*   Estimated Creatinine  Clearance: 53.4 mL/min (A) (by C-G formula based on SCr of 0.6 mg/dL (L)).  Radiologic Imaging: No results found.   Impression/Recommendation Gross hematuria S/p TURBT, urethral dilation 09/14/22  I had irrigated his catheter with normal saline.  There was return of some small clots.  The catheter irrigated easily and appeared to be draining well following irrigation.   I do not think his catheter needs to be changed as he is due to have it removed tomorrow. Follow-up with Dr. Arita Miss on 09/24/2022 for voiding trial as scheduled.  Di Kindle 09/23/2022, 5:44 AM

## 2022-09-23 NOTE — ED Triage Notes (Signed)
Pt to ED by EMS from [place]. Pt had a foley placed on the 21st and has had bloody output since it was placed. EMS was called tonight as pt is having some leakage around the catheter, denies any pain. Arrives at baseline orientation, VSS, NADN.

## 2022-09-23 NOTE — ED Notes (Signed)
[facility] called for transport back to [place].

## 2022-09-23 NOTE — ED Notes (Signed)
Provided pt with warm blankets and turned lights out so pt can rest. Call light within reach and bed at lowest level.

## 2022-09-23 NOTE — Discharge Instructions (Signed)
You have been evaluated for your symptoms.  Your bladder was irrigated and and clots were removed.  He is scheduled to be seen by your urologist tomorrow please follow-up as indicated.  Return to the ER if you develop worsening pain, difficulty urinating, or if you have other concerns.

## 2022-09-24 DIAGNOSIS — N35012 Post-traumatic membranous urethral stricture: Secondary | ICD-10-CM | POA: Diagnosis not present

## 2022-09-24 DIAGNOSIS — C672 Malignant neoplasm of lateral wall of bladder: Secondary | ICD-10-CM | POA: Diagnosis not present

## 2022-09-25 ENCOUNTER — Emergency Department (HOSPITAL_COMMUNITY)
Admission: EM | Admit: 2022-09-25 | Discharge: 2022-09-25 | Disposition: A | Discharge status: Home / Self Care | Payer: Medicare HMO | Source: Home / Self Care | Attending: Emergency Medicine | Admitting: Emergency Medicine

## 2022-09-25 ENCOUNTER — Other Ambulatory Visit: Payer: Self-pay

## 2022-09-25 DIAGNOSIS — R310 Gross hematuria: Secondary | ICD-10-CM | POA: Diagnosis present

## 2022-09-25 DIAGNOSIS — E861 Hypovolemia: Secondary | ICD-10-CM | POA: Diagnosis present

## 2022-09-25 DIAGNOSIS — G629 Polyneuropathy, unspecified: Secondary | ICD-10-CM | POA: Diagnosis present

## 2022-09-25 DIAGNOSIS — N390 Urinary tract infection, site not specified: Secondary | ICD-10-CM | POA: Diagnosis present

## 2022-09-25 DIAGNOSIS — Z8546 Personal history of malignant neoplasm of prostate: Secondary | ICD-10-CM | POA: Diagnosis not present

## 2022-09-25 DIAGNOSIS — I10 Essential (primary) hypertension: Secondary | ICD-10-CM | POA: Diagnosis present

## 2022-09-25 DIAGNOSIS — R58 Hemorrhage, not elsewhere classified: Secondary | ICD-10-CM | POA: Diagnosis not present

## 2022-09-25 DIAGNOSIS — R000 Tachycardia, unspecified: Secondary | ICD-10-CM | POA: Diagnosis not present

## 2022-09-25 DIAGNOSIS — N3289 Other specified disorders of bladder: Secondary | ICD-10-CM | POA: Diagnosis not present

## 2022-09-25 DIAGNOSIS — Z743 Need for continuous supervision: Secondary | ICD-10-CM | POA: Diagnosis not present

## 2022-09-25 DIAGNOSIS — R339 Retention of urine, unspecified: Secondary | ICD-10-CM | POA: Diagnosis not present

## 2022-09-25 DIAGNOSIS — I959 Hypotension, unspecified: Secondary | ICD-10-CM | POA: Diagnosis not present

## 2022-09-25 DIAGNOSIS — Z79899 Other long term (current) drug therapy: Secondary | ICD-10-CM | POA: Diagnosis not present

## 2022-09-25 DIAGNOSIS — R739 Hyperglycemia, unspecified: Secondary | ICD-10-CM | POA: Diagnosis present

## 2022-09-25 DIAGNOSIS — T83122A Displacement of urinary stent, initial encounter: Secondary | ICD-10-CM | POA: Diagnosis not present

## 2022-09-25 DIAGNOSIS — R338 Other retention of urine: Secondary | ICD-10-CM | POA: Diagnosis present

## 2022-09-25 DIAGNOSIS — C679 Malignant neoplasm of bladder, unspecified: Secondary | ICD-10-CM | POA: Diagnosis present

## 2022-09-25 DIAGNOSIS — Z87891 Personal history of nicotine dependence: Secondary | ICD-10-CM | POA: Diagnosis not present

## 2022-09-25 DIAGNOSIS — E871 Hypo-osmolality and hyponatremia: Secondary | ICD-10-CM | POA: Diagnosis present

## 2022-09-25 DIAGNOSIS — Z7401 Bed confinement status: Secondary | ICD-10-CM | POA: Diagnosis not present

## 2022-09-25 DIAGNOSIS — I350 Nonrheumatic aortic (valve) stenosis: Secondary | ICD-10-CM | POA: Diagnosis not present

## 2022-09-25 DIAGNOSIS — D494 Neoplasm of unspecified behavior of bladder: Secondary | ICD-10-CM | POA: Diagnosis not present

## 2022-09-25 DIAGNOSIS — R319 Hematuria, unspecified: Secondary | ICD-10-CM | POA: Diagnosis not present

## 2022-09-25 DIAGNOSIS — L89151 Pressure ulcer of sacral region, stage 1: Secondary | ICD-10-CM | POA: Diagnosis present

## 2022-09-25 NOTE — ED Notes (Signed)
[facility] called for pt. Transportation back to [facility]

## 2022-09-25 NOTE — ED Notes (Signed)
ED Provider at bedside. 

## 2022-09-25 NOTE — ED Notes (Signed)
Pt to ED by EMS from [place]. Pt endorses having foley for blood clots. States happening again.  Upon assessment foley bag 1/2 filled with bright red blood and some visible clots. Pt denies any pain at this time. States pain only when clot is passing. Patient AAOx4, NAD. Resp even and unlabored. Awaiting EDP eval

## 2022-09-25 NOTE — ED Provider Notes (Signed)
[place] [facility]ERGENCY DEPARTMENT AT [facility] Provider Note   CSN: [id number] Arrival date & time: 09/25/22  1754     History  Chief Complaint  Patient presents with   Hematuria    Jeffrey Porter is a 89 y.o. male.  89 yo M with a cc of pain at his urinary catheter site.  Patient has been struggling with hematuria.  He has had a procedure to remove the bladder tumor have come to the ER 3 times for irrigation.  Today because he feels like he is clogged.  Was irrigated prior to my discussion with him.  He feels little bit better but feels like he needs more irrigation.  Denies fevers denies flank pain.   Hematuria       Home Medications Prior to Admission medications   Medication Sig Start Date End Date Taking? Authorizing Provider  acetaminophen (TYLENOL) 500 MG tablet Take 500 mg by mouth daily as needed for mild pain or headache.    [provider]  BIOTIN PO Take 1 tablet by mouth daily. Patient not taking    [provider]  Carbidopa-Levodopa ER (SINEMET CR) 25-100 MG tablet controlled release TAKE 1 TABLET BY MOUTH THREE TIMES A DAY Patient not taking: Reported on 06/18/2021 03/13/18   Tat, Octaviano Batty, DO  Cholecalciferol (VITAMIN D3 SUPER STRENGTH) 50 MCG (2000 UT) CAPS Take 2,000 Units by mouth daily.    [provider]  Cyanocobalamin (VITAMIN B-12 PO) Take 1 tablet by mouth daily.    [provider]  latanoprost (XALATAN) 0.005 % ophthalmic solution Place 1 drop into both eyes at bedtime.     [provider]  Magnesium 250 MG TABS Take 250 mg by mouth daily.    [provider]  Multiple Vitamins-Minerals (CENTRUM SILVER 50+MEN) TABS Take 1 tablet by mouth daily with breakfast.    [provider]  niacin 100 MG tablet Take 100 mg by mouth at bedtime.    [provider]  tamsulosin (FLOMAX) 0.4 MG CAPS capsule Take 1 capsule (0.4 mg total) by mouth at bedtime. 09/14/22   Noel Christmas, MD   zinc gluconate 50 MG tablet Take 50 mg by mouth daily.    [provider]      Allergies    Timolol maleate    Review of Systems   Review of Systems  Genitourinary:  Positive for hematuria.    Physical Exam Updated Vital Signs BP (!) 160/95 (BP Location: Right Arm)   Pulse (!) 104   Temp 98 F (36.7 C) (Oral)   Resp 17   Ht 5\' 2"  (1.575 m)   Wt 66 kg   SpO2 97%   BMI 26.61 kg/m  Physical Exam Vitals and nursing note reviewed.  Constitutional:      Appearance: He is well-developed.  HENT:     Head: Normocephalic and atraumatic.  Eyes:     Pupils: Pupils are equal, round, and reactive to light.  Neck:     Vascular: No JVD.  Cardiovascular:     Rate and Rhythm: Normal rate and regular rhythm.     Heart sounds: No murmur heard.    No friction rub. No gallop.  Pulmonary:     Effort: No respiratory distress.     Breath sounds: No wheezing.  Abdominal:     General: There is no distension.     Tenderness: There is no abdominal tenderness. There is no guarding or rebound.  Genitourinary:  Comments: Foley catheter in place.  Gross hematuria. Musculoskeletal:        General: Normal range of motion.     Cervical back: Normal range of motion and neck supple.  Skin:    Coloration: Skin is not pale.     Findings: No rash.  Neurological:     Mental Status: He is alert and oriented to person, place, and time.  Psychiatric:        Behavior: Behavior normal.     ED Results / Procedures / Treatments   Labs (all labs ordered are listed, but only abnormal results are displayed) Labs Reviewed - No data to display  EKG None  Radiology No results found.  Procedures Procedures    Medications Ordered in ED Medications - No data to display  ED Course/ Medical Decision Making/ A&P                             Medical Decision Making  89 yo M with a chief complaint of hematuria.  Patient unfortunately is been struggling with this.  Had a urologic  procedure to remove a bladder tumor.  He tells me he has an appointment next week to trial of void.  Irrigated here with some improvement.  Is requesting further irrigation.  Will irrigate and reassess.  I went back in to reassess the patient and saw that he continued to have good flow from his urinary catheter.  The patient is upset and tells me that he needs 4 L of fluids to irrigate his bladder.  He tells me he is not leaving until he has it done.  He accused me of drinking alcohol at some point and told me that the service here was terrible.  I tried to have a discussion with him about how is not the amount of fluid that is necessary but it is just to get the clots out and let them continue draining.  The patient continues to demand to have his catheter irrigated.  Will irrigate the 4 L here.  Will encourage him to follow-up with his urologist as an outpatient.  8:59 PM:  I have discussed the diagnosis/risks/treatment options with the patient.  Evaluation and diagnostic testing in the emergency department does not suggest an emergent condition requiring admission or immediate intervention beyond what has been performed at this time.  They will follow up with PCP, Urology. We also discussed returning to the ED immediately if new or worsening sx occur. We discussed the sx which are most concerning (e.g., sudden worsening pain, fever, inability to tolerate by mouth, inability to urinate, worsening bleeding) that necessitate immediate return. Medications administered to the patient during their visit and any new prescriptions provided to the patient are listed below.  Medications given during this visit Medications - No data to display   The patient appears reasonably screen and/or stabilized for discharge and I doubt any other medical condition or other [facility] requiring further screening, evaluation, or treatment in the ED at this time prior to discharge.            Final Clinical  Impression(s) / ED Diagnoses Final diagnoses:  Gross hematuria    Rx / DC Orders ED Discharge Orders     None         Melene Plan, DO 09/25/22 2059

## 2022-09-25 NOTE — ED Notes (Signed)
Pt. Provided with graham crackers, peanut butter, and an icee.

## 2022-09-25 NOTE — ED Notes (Signed)
RN Shatia irrigated foley catheter, noted multiple clots coming out. Drainage now appears pinkish. Patient states he is feeling a lot better. Catheter drainage bag also changed

## 2022-09-25 NOTE — Discharge Instructions (Signed)
Follow up with your doctor in the office.  Return for worsening bleeding, inability to urinate.

## 2022-09-27 ENCOUNTER — Inpatient Hospital Stay (HOSPITAL_COMMUNITY)
Admission: EM | Admit: 2022-09-27 | Discharge: 2022-10-05 | DRG: 660 | Disposition: A | Discharge status: Home / Self Care | Payer: Medicare HMO | Attending: Internal Medicine | Admitting: Internal Medicine

## 2022-09-27 ENCOUNTER — Other Ambulatory Visit: Payer: Self-pay

## 2022-09-27 ENCOUNTER — Emergency Department (HOSPITAL_COMMUNITY): Payer: Medicare HMO

## 2022-09-27 ENCOUNTER — Encounter (HOSPITAL_COMMUNITY): Payer: Self-pay

## 2022-09-27 DIAGNOSIS — E871 Hypo-osmolality and hyponatremia: Secondary | ICD-10-CM | POA: Diagnosis present

## 2022-09-27 DIAGNOSIS — R319 Hematuria, unspecified: Secondary | ICD-10-CM | POA: Diagnosis not present

## 2022-09-27 DIAGNOSIS — N390 Urinary tract infection, site not specified: Secondary | ICD-10-CM | POA: Diagnosis present

## 2022-09-27 DIAGNOSIS — C679 Malignant neoplasm of bladder, unspecified: Secondary | ICD-10-CM | POA: Diagnosis present

## 2022-09-27 DIAGNOSIS — R310 Gross hematuria: Secondary | ICD-10-CM | POA: Diagnosis present

## 2022-09-27 DIAGNOSIS — L89151 Pressure ulcer of sacral region, stage 1: Secondary | ICD-10-CM | POA: Diagnosis present

## 2022-09-27 DIAGNOSIS — R339 Retention of urine, unspecified: Secondary | ICD-10-CM | POA: Diagnosis not present

## 2022-09-27 DIAGNOSIS — D494 Neoplasm of unspecified behavior of bladder: Secondary | ICD-10-CM | POA: Diagnosis not present

## 2022-09-27 DIAGNOSIS — R338 Other retention of urine: Secondary | ICD-10-CM | POA: Diagnosis present

## 2022-09-27 DIAGNOSIS — E861 Hypovolemia: Secondary | ICD-10-CM | POA: Diagnosis present

## 2022-09-27 DIAGNOSIS — R739 Hyperglycemia, unspecified: Secondary | ICD-10-CM | POA: Diagnosis present

## 2022-09-27 DIAGNOSIS — N3289 Other specified disorders of bladder: Secondary | ICD-10-CM | POA: Diagnosis not present

## 2022-09-27 DIAGNOSIS — Z79899 Other long term (current) drug therapy: Secondary | ICD-10-CM | POA: Diagnosis not present

## 2022-09-27 DIAGNOSIS — G629 Polyneuropathy, unspecified: Secondary | ICD-10-CM | POA: Diagnosis present

## 2022-09-27 DIAGNOSIS — I350 Nonrheumatic aortic (valve) stenosis: Secondary | ICD-10-CM | POA: Diagnosis not present

## 2022-09-27 DIAGNOSIS — Z87891 Personal history of nicotine dependence: Secondary | ICD-10-CM | POA: Diagnosis not present

## 2022-09-27 DIAGNOSIS — Z8546 Personal history of malignant neoplasm of prostate: Secondary | ICD-10-CM | POA: Diagnosis not present

## 2022-09-27 DIAGNOSIS — T83122A Displacement of urinary stent, initial encounter: Secondary | ICD-10-CM | POA: Diagnosis not present

## 2022-09-27 DIAGNOSIS — R000 Tachycardia, unspecified: Secondary | ICD-10-CM | POA: Diagnosis not present

## 2022-09-27 DIAGNOSIS — L8990 Pressure ulcer of unspecified site, unspecified stage: Secondary | ICD-10-CM | POA: Insufficient documentation

## 2022-09-27 DIAGNOSIS — I10 Essential (primary) hypertension: Secondary | ICD-10-CM | POA: Diagnosis present

## 2022-09-27 LAB — I-STAT CHEM 8, ED
BUN: 17 mg/dL (ref 8–23)
Calcium, Ion: 1.12 mmol/L — ABNORMAL LOW (ref 1.15–1.40)
Chloride: 92 mmol/L — ABNORMAL LOW (ref 98–111)
Creatinine, Ser: 0.6 mg/dL — ABNORMAL LOW (ref 0.61–1.24)
Glucose, Bld: 150 mg/dL — ABNORMAL HIGH (ref 70–99)
HCT: 32 % — ABNORMAL LOW (ref 39.0–52.0)
Hemoglobin: 10.9 g/dL — ABNORMAL LOW (ref 13.0–17.0)
Potassium: 4.1 mmol/L (ref 3.5–5.1)
Sodium: 125 mmol/L — ABNORMAL LOW (ref 135–145)
TCO2: 24 mmol/L (ref 22–32)

## 2022-09-27 LAB — BASIC METABOLIC PANEL
Anion gap: 12 (ref 5–15)
BUN: 18 mg/dL (ref 8–23)
CO2: 22 mmol/L (ref 22–32)
Calcium: 8.9 mg/dL (ref 8.9–10.3)
Chloride: 92 mmol/L — ABNORMAL LOW (ref 98–111)
Creatinine, Ser: 0.77 mg/dL (ref 0.61–1.24)
GFR, Estimated: >60 mL/min (ref >60)
Glucose, Bld: 153 mg/dL — ABNORMAL HIGH (ref 70–99)
Potassium: 3.7 mmol/L (ref 3.5–5.1)
Sodium: 126 mmol/L — ABNORMAL LOW (ref 135–145)

## 2022-09-27 LAB — URINALYSIS, MICROSCOPIC (REFLEX)
RBC / HPF: >50 RBC/hpf (ref 0–5)
WBC, UA: >50 WBC/hpf (ref 0–5)

## 2022-09-27 LAB — CBC
HCT: 30.4 % — ABNORMAL LOW (ref 39.0–52.0)
Hemoglobin: 10.8 g/dL — ABNORMAL LOW (ref 13.0–17.0)
MCH: 30.2 pg (ref 26.0–34.0)
MCHC: 35.5 g/dL (ref 30.0–36.0)
MCV: 84.9 fL (ref 80.0–100.0)
Platelets: 376 10*3/uL (ref 150–400)
RBC: 3.58 MIL/uL — ABNORMAL LOW (ref 4.22–5.81)
RDW: 12.2 % (ref 11.5–15.5)
WBC: 17.2 10*3/uL — ABNORMAL HIGH (ref 4.0–10.5)
nRBC: 0 % (ref 0.0–0.2)

## 2022-09-27 LAB — URINALYSIS, ROUTINE W REFLEX MICROSCOPIC

## 2022-09-27 LAB — CBG MONITORING, ED: Glucose-Capillary: 127 mg/dL — ABNORMAL HIGH (ref 70–99)

## 2022-09-27 LAB — HEMOGLOBIN AND HEMATOCRIT, BLOOD
HCT: 28 % — ABNORMAL LOW (ref 39.0–52.0)
Hemoglobin: 9.7 g/dL — ABNORMAL LOW (ref 13.0–17.0)

## 2022-09-27 MED ORDER — OXYCODONE-ACETAMINOPHEN 5-325 MG PO TABS
1.0000 | ORAL_TABLET | Freq: Once | ORAL | Status: AC
  Administered 2022-09-27: 1 via ORAL
  Filled 2022-09-27: qty 1

## 2022-09-27 MED ORDER — SODIUM CHLORIDE 0.9 % IV BOLUS
1000.0000 mL | Freq: Once | INTRAVENOUS | Status: AC
  Administered 2022-09-27: 1000 mL via INTRAVENOUS

## 2022-09-27 MED ORDER — SODIUM CHLORIDE 0.9 % IV SOLN
1.0000 g | Freq: Once | INTRAVENOUS | Status: AC
  Administered 2022-09-27: 1 g via INTRAVENOUS
  Filled 2022-09-27: qty 10

## 2022-09-27 MED ORDER — HYDROMORPHONE HCL 1 MG/ML IJ SOLN
0.5000 mg | INTRAMUSCULAR | Status: DC | PRN
  Administered 2022-09-29: 0.5 mg via INTRAVENOUS
  Filled 2022-09-27 (×2): qty 0.5

## 2022-09-27 MED ORDER — OXYCODONE HCL 5 MG PO TABS
5.0000 mg | ORAL_TABLET | Freq: Four times a day (QID) | ORAL | Status: DC | PRN
  Administered 2022-09-29: 5 mg via ORAL
  Filled 2022-09-27: qty 1

## 2022-09-27 MED ORDER — MELATONIN 5 MG PO TABS
5.0000 mg | ORAL_TABLET | Freq: Every evening | ORAL | Status: DC | PRN
  Administered 2022-09-29 – 2022-10-03 (×4): 5 mg via ORAL
  Filled 2022-09-27 (×5): qty 1

## 2022-09-27 MED ORDER — HYDROMORPHONE HCL 1 MG/ML IJ SOLN
1.0000 mg | Freq: Once | INTRAMUSCULAR | Status: AC
  Administered 2022-09-27: 1 mg via INTRAVENOUS
  Filled 2022-09-27: qty 1

## 2022-09-27 MED ORDER — POLYETHYLENE GLYCOL 3350 17 G PO PACK
17.0000 g | PACK | Freq: Every day | ORAL | Status: DC | PRN
  Administered 2022-10-01: 17 g via ORAL
  Filled 2022-09-27 (×2): qty 1

## 2022-09-27 MED ORDER — TAMSULOSIN HCL 0.4 MG PO CAPS
0.4000 mg | ORAL_CAPSULE | Freq: Every day | ORAL | Status: DC
  Administered 2022-09-27 – 2022-10-05 (×7): 0.4 mg via ORAL
  Filled 2022-09-27 (×9): qty 1

## 2022-09-27 MED ORDER — INSULIN ASPART 100 UNIT/ML IJ SOLN
0.0000 [IU] | Freq: Three times a day (TID) | INTRAMUSCULAR | Status: DC
Start: 2022-09-28 — End: 2022-09-27

## 2022-09-27 MED ORDER — ACETAMINOPHEN 325 MG PO TABS
650.0000 mg | ORAL_TABLET | Freq: Four times a day (QID) | ORAL | Status: DC | PRN
  Administered 2022-10-01 – 2022-10-05 (×2): 650 mg via ORAL
  Filled 2022-09-27 (×2): qty 2

## 2022-09-27 MED ORDER — SODIUM CHLORIDE 0.9 % IV SOLN
2.0000 g | INTRAVENOUS | Status: DC
  Administered 2022-09-28 – 2022-09-30 (×3): 2 g via INTRAVENOUS
  Filled 2022-09-27 (×3): qty 20

## 2022-09-27 MED ORDER — SODIUM CHLORIDE 0.9 % IV SOLN: INTRAVENOUS | Status: AC

## 2022-09-27 MED ORDER — VITAMIN B-12 1000 MCG PO TABS
1000.0000 ug | ORAL_TABLET | Freq: Every day | ORAL | Status: DC
  Administered 2022-09-28 – 2022-10-05 (×7): 1000 ug via ORAL
  Filled 2022-09-27 (×7): qty 1

## 2022-09-27 MED ORDER — PROCHLORPERAZINE EDISYLATE 10 MG/2ML IJ SOLN
5.0000 mg | Freq: Four times a day (QID) | INTRAMUSCULAR | Status: DC | PRN
  Administered 2022-09-27: 5 mg via INTRAVENOUS
  Filled 2022-09-27: qty 2

## 2022-09-27 MED ORDER — SODIUM CHLORIDE 0.9 % IR SOLN
3000.0000 mL | Status: DC
  Administered 2022-09-27 – 2022-09-29 (×6): 3000 mL

## 2022-09-27 NOTE — Consult Note (Signed)
Urology Consult    Reason for consult: Gross hematuria, clot retention  History of Present Illness: Jeffrey Porter is a 89 y.o. male seen for evaluation of gross hematuria and clot retention.  He is status post cystoscopy with laser lithotripsy of urethral calculus, balloon dilation of urethral stricture, and transurethral resection of bladder tumor on 09/14/2022 by Dr. Arita Miss.  He was discharged home with a Foley catheter in place.  He has had gross hematuria since the procedure and has presented to the ED several times (5/26, 5/30, 6/1) with similar issues. Typically his hematuria improves after hand irrigation of clots  He is doing OK at the time of my exam. His foley was exchanged in the ED to a 22 fr 3 way catheter and his symptoms have improved. there is merlot colored urine draining into the foley. Labs in the ED notable for WBC 17  CT pelvis obtained in ED and I reviewed the images. The bladder is decompressed without significant clot burden. Catheter is appropriately placed.   Past Medical History:  Diagnosis Date   Baker's cyst of knee, left    Glaucoma    Hypertension    Neuromuscular disorder (HCC)    peripheral neuropathy   Prostate cancer [facility])     Past Surgical History:  Procedure Laterality Date   BALLOON DILATION N/A 08/03/2019   Procedure: BALLOON DILATION;  Surgeon: Sebastian Ache, MD;  Location: [facility];  Service: Urology;  Laterality: N/A;   CATARACT EXTRACTION     CYSTOSCOPY WITH RETROGRADE URETHROGRAM N/A 08/03/2019   Procedure: CYSTOSCOPY WITH RETROGRADE URETHROGRAM;  Surgeon: Sebastian Ache, MD;  Location: [facility];  Service: Urology;  Laterality: N/A;   HERNIA REPAIR     HOLMIUM LASER APPLICATION  09/14/2022   Procedure: HOLMIUM LASER APPLICATION OF PROSTATIC URETHRAL STONE;  Surgeon: Noel Christmas, MD;  Location: [facility];  Service: Urology;;   INSERTION BRACHYTHERAPY DEVICE     TRANSURETHRAL RESECTION OF BLADDER TUMOR N/A 09/14/2022   Procedure:  TRANSURETHRAL RESECTION OF BLADDER TUMOR (TURBT);  Surgeon: Noel Christmas, MD;  Location: [facility];  Service: Urology;  Laterality: N/A;  45 MINUTES    Medications:  Scheduled Meds: Continuous Infusions:  cefTRIAXone (ROCEPHIN)  IV     sodium chloride     sodium chloride irrigation     PRN Meds:.  Allergies:  Allergies  Allergen Reactions   Timolol Maleate Other (See Comments)    Syncope     Family History  Problem Relation Age of Onset   Cancer Mother    Cancer Father     Social History:  reports that he has quit smoking. His smoking use included cigarettes. He has never used smokeless tobacco. He reports that he does not currently use alcohol after a past usage of about 1.0 standard drink of alcohol per week. He reports that he does not use drugs.  ROS: A complete review of systems was performed.  All systems are negative except for pertinent findings as noted.  Physical Exam:  Vital signs in last 24 hours: Temp:  [98.6 F (37 C)] 98.6 F (37 C) (06/03 1813) Pulse Rate:  [94-143] 94 (06/03 1839) Resp:  [10-18] 10 (06/03 1839) BP: (130-168)/(59-77) 130/59 (06/03 1839) SpO2:  [90 %-96 %] 90 % (06/03 1839) Weight:  [66 kg] 66 kg (06/03 1428) GENERAL APPEARANCE:  Well appearing, well developed, well nourished, NAD HEENT:  Atraumatic, normocephalic, oropharynx clear NECK:  Supple without lymphadenopathy or thyromegaly ABDOMEN:  Soft,  non-tender, no masses EXTREMITIES:  Moves all extremities well, without clubbing, cyanosis, or edema NEUROLOGIC:  Alert and oriented x 3,  CN II-XII grossly intact MENTAL STATUS:  appropriate BACK:  Non-tender to palpation, No CVAT SKIN:  Warm, dry, and intact GU:  foley in place draining merlot colored urine  Laboratory Data:  Recent Labs    09/27/22 1615 09/27/22 1817  WBC  --  17.2*  HGB 10.9* 10.8*  HCT 32.0* 30.4*  PLT  --  376     Recent Labs    09/27/22 1615 09/27/22 1817  NA 125* 126*  K 4.1  3.7  CL 92* 92*  GLUCOSE 150* 153*  BUN 17 18  CALCIUM  --  8.9  CREATININE 0.60* 0.77      Results for orders placed or performed during the hospital encounter of 09/27/22 (from the past 24 hour(s))  Urinalysis, Routine w reflex microscopic -Urine, Clean Catch     Status: Abnormal   Collection Time: 09/27/22  3:28 PM  Result Value Ref Range   Color, Urine RED (A) YELLOW   APPearance TURBID (A) CLEAR   Specific Gravity, Urine  1.005 - 1.030    TEST NOT REPORTED DUE TO COLOR INTERFERENCE OF URINE PIGMENT   pH  5.0 - 8.0    TEST NOT REPORTED DUE TO COLOR INTERFERENCE OF URINE PIGMENT   Glucose, UA (A) NEGATIVE mg/dL    TEST NOT REPORTED DUE TO COLOR INTERFERENCE OF URINE PIGMENT   Hgb urine dipstick LARGE (A) NEGATIVE   Bilirubin Urine (A) NEGATIVE    TEST NOT REPORTED DUE TO COLOR INTERFERENCE OF URINE PIGMENT   Ketones, ur (A) NEGATIVE mg/dL    TEST NOT REPORTED DUE TO COLOR INTERFERENCE OF URINE PIGMENT   Protein, ur (A) NEGATIVE mg/dL    TEST NOT REPORTED DUE TO COLOR INTERFERENCE OF URINE PIGMENT   Nitrite (A) NEGATIVE    TEST NOT REPORTED DUE TO COLOR INTERFERENCE OF URINE PIGMENT   Leukocytes,Ua (A) NEGATIVE    TEST NOT REPORTED DUE TO COLOR INTERFERENCE OF URINE PIGMENT  Urinalysis, Microscopic (reflex)     Status: Abnormal   Collection Time: 09/27/22  3:28 PM  Result Value Ref Range   RBC / HPF >50 0 - 5 RBC/hpf   WBC, UA >50 0 - 5 WBC/hpf   Bacteria, UA MANY (A) NONE SEEN   Squamous Epithelial / HPF 0-5 0 - 5 /HPF   WBC Clumps PRESENT   I-stat chem 8, ED (not at [facility], [facility] or [facility])     Status: Abnormal   Collection Time: 09/27/22  4:15 PM  Result Value Ref Range   Sodium 125 (L) 135 - 145 mmol/L   Potassium 4.1 3.5 - 5.1 mmol/L   Chloride 92 (L) 98 - 111 mmol/L   BUN 17 8 - 23 mg/dL   Creatinine, Ser 1.61 (L) 0.61 - 1.24 mg/dL   Glucose, Bld 096 (H) 70 - 99 mg/dL   Calcium, Ion 0.45 (L) 1.15 - 1.40 mmol/L   TCO2 24 22 - 32 mmol/L   Hemoglobin 10.9 (L) 13.0 -  17.0 g/dL   HCT 40.9 (L) 81.1 - 91.4 %  Basic metabolic panel     Status: Abnormal   Collection Time: 09/27/22  6:17 PM  Result Value Ref Range   Sodium 126 (L) 135 - 145 mmol/L   Potassium 3.7 3.5 - 5.1 mmol/L   Chloride 92 (L) 98 - 111 mmol/L   CO2 22 22 -  32 mmol/L   Glucose, Bld 153 (H) 70 - 99 mg/dL   BUN 18 8 - 23 mg/dL   Creatinine, Ser 8.11 0.61 - 1.24 mg/dL   Calcium 8.9 8.9 - 91.4 mg/dL   GFR, Estimated >78 >29 mL/min   Anion gap 12 5 - 15  CBC     Status: Abnormal   Collection Time: 09/27/22  6:17 PM  Result Value Ref Range   WBC 17.2 (H) 4.0 - 10.5 K/uL   RBC 3.58 (L) 4.22 - 5.81 MIL/uL   Hemoglobin 10.8 (L) 13.0 - 17.0 g/dL   HCT 56.2 (L) 13.0 - 86.5 %   MCV 84.9 80.0 - 100.0 fL   MCH 30.2 26.0 - 34.0 pg   MCHC 35.5 30.0 - 36.0 g/dL   RDW 78.4 69.6 - 29.5 %   Platelets 376 150 - 400 K/uL   nRBC 0.0 0.0 - 0.2 %   No results found for this or any previous visit (from the past 240 hour(s)).  Renal Function: Recent Labs    09/23/22 0232 09/27/22 1615 09/27/22 1817  CREATININE 0.60* 0.60* 0.77    Estimated Creatinine Clearance: 53.4 mL/min (by C-G formula based on SCr of 0.77 mg/dL).  Radiologic Imaging: No results found.   Impression/Recommendation Gross hematuria S/p TURBT, urethral dilation 09/14/22  --I hand irrigated his catheter with normal saline.  There was return of ~100 cc of small clots. The catheter drained pink thereafter.   --check Ucx; with elevated WBC and his catheter manipulation over the last week he is at higher risk of UTI --start CBI; titrate to light pink; OK to hand irrigate PRN if clogged --I will let Dr Arita Miss know the patient is being admitted; GU following   Railynn Ballo L Lynora Dymond 09/27/2022, 7:23 PM

## 2022-09-27 NOTE — H&P (View-Only) (Signed)
Urology Consult    Reason for consult: Gross hematuria, clot retention  History of Present Illness: Jeffrey Porter is a 89 y.o. male seen for evaluation of gross hematuria and clot retention.  He is status post cystoscopy with laser lithotripsy of urethral calculus, balloon dilation of urethral stricture, and transurethral resection of bladder tumor on 09/14/2022 by Dr. Pace.  He was discharged home with a Foley catheter in place.  He has had gross hematuria since the procedure and has presented to the ED several times (5/26, 5/30, 6/1) with similar issues. Typically his hematuria improves after hand irrigation of clots  He is doing OK at the time of my exam. His foley was exchanged in the ED to a 22 fr 3 way catheter and his symptoms have improved. there is merlot colored urine draining into the foley. Labs in the ED notable for WBC 17  CT pelvis obtained in ED and I reviewed the images. The bladder is decompressed without significant clot burden. Catheter is appropriately placed.   Past Medical History:  Diagnosis Date   Baker's cyst of knee, left    Glaucoma    Hypertension    Neuromuscular disorder (HCC)    peripheral neuropathy   Prostate cancer (HCC)     Past Surgical History:  Procedure Laterality Date   BALLOON DILATION N/A 08/03/2019   Procedure: BALLOON DILATION;  Surgeon: Manny, Theodore, MD;  Location: [facility];  Service: Urology;  Laterality: N/A;   CATARACT EXTRACTION     CYSTOSCOPY WITH RETROGRADE URETHROGRAM N/A 08/03/2019   Procedure: CYSTOSCOPY WITH RETROGRADE URETHROGRAM;  Surgeon: Manny, Theodore, MD;  Location: [facility];  Service: Urology;  Laterality: N/A;   HERNIA REPAIR     HOLMIUM LASER APPLICATION  09/14/2022   Procedure: HOLMIUM LASER APPLICATION OF PROSTATIC URETHRAL STONE;  Surgeon: Pace, Maryellen D, MD;  Location: [facility];  Service: Urology;;   INSERTION BRACHYTHERAPY DEVICE     TRANSURETHRAL RESECTION OF BLADDER TUMOR N/A 09/14/2022   Procedure:  TRANSURETHRAL RESECTION OF BLADDER TUMOR (TURBT);  Surgeon: Pace, Maryellen D, MD;  Location: [facility];  Service: Urology;  Laterality: N/A;  45 MINUTES    Medications:  Scheduled Meds: Continuous Infusions:  cefTRIAXone (ROCEPHIN)  IV     sodium chloride     sodium chloride irrigation     PRN Meds:.  Allergies:  Allergies  Allergen Reactions   Timolol Maleate Other (See Comments)    Syncope     Family History  Problem Relation Age of Onset   Cancer Mother    Cancer Father     Social History:  reports that he has quit smoking. His smoking use included cigarettes. He has never used smokeless tobacco. He reports that he does not currently use alcohol after a past usage of about 1.0 standard drink of alcohol per week. He reports that he does not use drugs.  ROS: A complete review of systems was performed.  All systems are negative except for pertinent findings as noted.  Physical Exam:  Vital signs in last 24 hours: Temp:  [98.6 F (37 C)] 98.6 F (37 C) (06/03 1813) Pulse Rate:  [94-143] 94 (06/03 1839) Resp:  [10-18] 10 (06/03 1839) BP: (130-168)/(59-77) 130/59 (06/03 1839) SpO2:  [90 %-96 %] 90 % (06/03 1839) Weight:  [66 kg] 66 kg (06/03 1428) GENERAL APPEARANCE:  Well appearing, well developed, well nourished, NAD HEENT:  Atraumatic, normocephalic, oropharynx clear NECK:  Supple without lymphadenopathy or thyromegaly ABDOMEN:  Soft,   non-tender, no masses EXTREMITIES:  Moves all extremities well, without clubbing, cyanosis, or edema NEUROLOGIC:  Alert and oriented x 3,  CN II-XII grossly intact MENTAL STATUS:  appropriate BACK:  Non-tender to palpation, No CVAT SKIN:  Warm, dry, and intact GU:  foley in place draining merlot colored urine  Laboratory Data:  Recent Labs    09/27/22 1615 09/27/22 1817  WBC  --  17.2*  HGB 10.9* 10.8*  HCT 32.0* 30.4*  PLT  --  376     Recent Labs    09/27/22 1615 09/27/22 1817  NA 125* 126*  K 4.1  3.7  CL 92* 92*  GLUCOSE 150* 153*  BUN 17 18  CALCIUM  --  8.9  CREATININE 0.60* 0.77      Results for orders placed or performed during the hospital encounter of 09/27/22 (from the past 24 hour(s))  Urinalysis, Routine w reflex microscopic -Urine, Clean Catch     Status: Abnormal   Collection Time: 09/27/22  3:28 PM  Result Value Ref Range   Color, Urine RED (A) YELLOW   APPearance TURBID (A) CLEAR   Specific Gravity, Urine  1.005 - 1.030    TEST NOT REPORTED DUE TO COLOR INTERFERENCE OF URINE PIGMENT   pH  5.0 - 8.0    TEST NOT REPORTED DUE TO COLOR INTERFERENCE OF URINE PIGMENT   Glucose, UA (A) NEGATIVE mg/dL    TEST NOT REPORTED DUE TO COLOR INTERFERENCE OF URINE PIGMENT   Hgb urine dipstick LARGE (A) NEGATIVE   Bilirubin Urine (A) NEGATIVE    TEST NOT REPORTED DUE TO COLOR INTERFERENCE OF URINE PIGMENT   Ketones, ur (A) NEGATIVE mg/dL    TEST NOT REPORTED DUE TO COLOR INTERFERENCE OF URINE PIGMENT   Protein, ur (A) NEGATIVE mg/dL    TEST NOT REPORTED DUE TO COLOR INTERFERENCE OF URINE PIGMENT   Nitrite (A) NEGATIVE    TEST NOT REPORTED DUE TO COLOR INTERFERENCE OF URINE PIGMENT   Leukocytes,Ua (A) NEGATIVE    TEST NOT REPORTED DUE TO COLOR INTERFERENCE OF URINE PIGMENT  Urinalysis, Microscopic (reflex)     Status: Abnormal   Collection Time: 09/27/22  3:28 PM  Result Value Ref Range   RBC / HPF >50 0 - 5 RBC/hpf   WBC, UA >50 0 - 5 WBC/hpf   Bacteria, UA MANY (A) NONE SEEN   Squamous Epithelial / HPF 0-5 0 - 5 /HPF   WBC Clumps PRESENT   I-stat chem 8, ED (not at [facility], [facility] or [facility])     Status: Abnormal   Collection Time: 09/27/22  4:15 PM  Result Value Ref Range   Sodium 125 (L) 135 - 145 mmol/L   Potassium 4.1 3.5 - 5.1 mmol/L   Chloride 92 (L) 98 - 111 mmol/L   BUN 17 8 - 23 mg/dL   Creatinine, Ser 0.60 (L) 0.61 - 1.24 mg/dL   Glucose, Bld 150 (H) 70 - 99 mg/dL   Calcium, Ion 1.12 (L) 1.15 - 1.40 mmol/L   TCO2 24 22 - 32 mmol/L   Hemoglobin 10.9 (L) 13.0 -  17.0 g/dL   HCT 32.0 (L) 39.0 - 52.0 %  Basic metabolic panel     Status: Abnormal   Collection Time: 09/27/22  6:17 PM  Result Value Ref Range   Sodium 126 (L) 135 - 145 mmol/L   Potassium 3.7 3.5 - 5.1 mmol/L   Chloride 92 (L) 98 - 111 mmol/L   CO2 22 22 -   32 mmol/L   Glucose, Bld 153 (H) 70 - 99 mg/dL   BUN 18 8 - 23 mg/dL   Creatinine, Ser 0.77 0.61 - 1.24 mg/dL   Calcium 8.9 8.9 - 10.3 mg/dL   GFR, Estimated >60 >60 mL/min   Anion gap 12 5 - 15  CBC     Status: Abnormal   Collection Time: 09/27/22  6:17 PM  Result Value Ref Range   WBC 17.2 (H) 4.0 - 10.5 K/uL   RBC 3.58 (L) 4.22 - 5.81 MIL/uL   Hemoglobin 10.8 (L) 13.0 - 17.0 g/dL   HCT 30.4 (L) 39.0 - 52.0 %   MCV 84.9 80.0 - 100.0 fL   MCH 30.2 26.0 - 34.0 pg   MCHC 35.5 30.0 - 36.0 g/dL   RDW 12.2 11.5 - 15.5 %   Platelets 376 150 - 400 K/uL   nRBC 0.0 0.0 - 0.2 %   No results found for this or any previous visit (from the past 240 hour(s)).  Renal Function: Recent Labs    09/23/22 0232 09/27/22 1615 09/27/22 1817  CREATININE 0.60* 0.60* 0.77    Estimated Creatinine Clearance: 53.4 mL/min (by C-G formula based on SCr of 0.77 mg/dL).  Radiologic Imaging: No results found.   Impression/Recommendation Gross hematuria S/p TURBT, urethral dilation 09/14/22  --I hand irrigated his catheter with normal saline.  There was return of ~100 cc of small clots. The catheter drained pink thereafter.   --check Ucx; with elevated WBC and his catheter manipulation over the last week he is at higher risk of UTI --start CBI; titrate to light pink; OK to hand irrigate PRN if clogged --I will let Dr Pace know the patient is being admitted; GU following   Brigitta Pricer L Towanna Avery 09/27/2022, 7:23 PM   

## 2022-09-27 NOTE — H&P (Addendum)
History and Physical  Jeffrey [facility] ZOX:[id number] DOB: 02-03-1932 DOA: 09/27/2022  Referring physician: Maryanna Shape, PA-EDP  PCP: Jeffrey Hazel, MD  Outpatient Specialists: Urology Patient coming from: Assisted living facility.  Chief Complaint: Gross hematuria.  HPI: Jeffrey Porter is a 89 y.o. male with medical history significant for ureteral stricture, ureteral calculus, bladder tumor, status post cystoscopy with laser lithotripsy of ureteral calculus, balloon dilatation of the ureteral stricture and transurethral resection of bladder tumor on 09/14/2022 by Dr. Arita [facility].  He was discharged home with a Foley catheter and since his procedure has had gross hematuria.  The patient has presented multiple times to the ED for gross hematuria.  This has been improved after hand irrigation of clots.  The patient presents to [facility] ED today for the same complaints of gross hematuria and suprapubic pain.  Urology consulted by EDP.  Urology saw the patient and hand irrigated his catheter with normal saline.  There was return of about 100 cc of small clots.  The catheter drained pink thereafter.  Urology recommended CT pelvis study given his persistent hematuria to evaluate the clot burden of his bladder.  Also recommended continuous bladder irrigation, titrate to light pink, okay to hand irrigate as needed if Foley catheter is clogged.  Also to obtain UA with urine culture due to high risk of UTI.  Urology will follow.  The patient was admitted by [facility], hospitalist service, to stepdown unit as inpatient status.  At the time of this visit the patient is alert and oriented.  His suprapubic pain is controlled with IV Dilaudid.  His daughter is present at bedside.  He gives consent for blood transfusion if needed.  ED Course: Temperature 98.6.  BP 119/63, pulse 93, respiratory 13, saturation 95% on room air.  Lab studies remarkable for WBC 17.2, hemoglobin 10.8, baseline hemoglobin 13.  Platelet count 376.  Serum sodium  126, glucose 153, creatinine 0.77, GFR greater than 60.  Review of Systems: Review of systems as noted in the HPI. All other systems reviewed and are negative.   Past Medical History:  Diagnosis Date   Baker's cyst of knee, left    Glaucoma    Hypertension    Neuromuscular disorder (HCC)    peripheral neuropathy   Prostate cancer [facility])    Past Surgical History:  Procedure Laterality Date   BALLOON DILATION N/A 08/03/2019   Procedure: BALLOON DILATION;  Surgeon: Jeffrey Ache, MD;  Location: [facility];  Service: Urology;  Laterality: N/A;   CATARACT EXTRACTION     CYSTOSCOPY WITH RETROGRADE URETHROGRAM N/A 08/03/2019   Procedure: CYSTOSCOPY WITH RETROGRADE URETHROGRAM;  Surgeon: Jeffrey Ache, MD;  Location: [facility];  Service: Urology;  Laterality: N/A;   HERNIA REPAIR     HOLMIUM LASER APPLICATION  09/14/2022   Procedure: HOLMIUM LASER APPLICATION OF PROSTATIC URETHRAL STONE;  Surgeon: Jeffrey Christmas, MD;  Location: [facility];  Service: Urology;;   INSERTION BRACHYTHERAPY DEVICE     TRANSURETHRAL RESECTION OF BLADDER TUMOR N/A 09/14/2022   Procedure: TRANSURETHRAL RESECTION OF BLADDER TUMOR (TURBT);  Surgeon: Jeffrey Christmas, MD;  Location: [facility];  Service: Urology;  Laterality: N/A;  39 MINUTES    Social History:  reports that he has quit smoking. His smoking use included cigarettes. He has never used smokeless tobacco. He reports that he does not currently use alcohol after a past usage of about 1.0 standard drink of alcohol per week. He reports that he does not use drugs.  Allergies  Allergen Reactions   Timolol Maleate Other (See Comments)    Syncope     Family History  Problem Relation Age of Onset   Cancer Mother    Cancer Father       Prior to Admission medications   Medication Sig Start Date End Date Taking? Authorizing Provider  acetaminophen (TYLENOL) 500 MG tablet Take 500 mg by mouth daily as needed for mild pain or  headache.    [provider]  BIOTIN PO Take 1 tablet by mouth daily. Patient not taking    [provider]  Carbidopa-Levodopa ER (SINEMET CR) 25-100 MG tablet controlled release TAKE 1 TABLET BY MOUTH THREE TIMES A DAY Patient not taking: Reported on 06/18/2021 03/13/18   [facility]  Cholecalciferol (VITAMIN D3 SUPER STRENGTH) 50 MCG (2000 UT) CAPS Take 2,000 Units by mouth daily.    [provider]  Cyanocobalamin (VITAMIN B-12 PO) Take 1 tablet by mouth daily.    [provider]  latanoprost (XALATAN) 0.005 % ophthalmic solution Place 1 drop into both eyes at bedtime.     [provider]  Magnesium 250 MG TABS Take 250 mg by mouth daily.    [provider]  Multiple Vitamins-Minerals (CENTRUM SILVER 50+MEN) TABS Take 1 tablet by mouth daily with breakfast.    [provider]  niacin 100 MG tablet Take 100 mg by mouth at bedtime.    [provider]  tamsulosin (FLOMAX) 0.4 MG CAPS capsule Take 1 capsule (0.4 mg total) by mouth at bedtime. 09/14/22   Jeffrey Christmas, MD  zinc gluconate 50 MG tablet Take 50 mg by mouth daily.    [provider]    Physical Exam: BP (!) 130/59   Pulse 94   Temp 98.6 F (37 C)   Resp 10   Ht 5\' 2"  (1.575 m)   Wt 66 kg   SpO2 90%   BMI 26.61 kg/m   General: 89 y.o. year-old male well developed well nourished in no acute distress.  Alert and oriented x3. Cardiovascular: Regular rate and rhythm with no rubs or gallops.  No thyromegaly or JVD noted.  No lower extremity edema. 2/4 pulses in all 4 extremities. Respiratory: Clear to auscultation with no wheezes or rales. Good inspiratory effort. Abdomen: Soft suprapubic tenderness nondistended with normal bowel sounds x4 quadrants. Muskuloskeletal: No cyanosis, clubbing or edema noted bilaterally Neuro: CN II-XII intact, strength, sensation, reflexes Skin: No ulcerative lesions noted or rashes Psychiatry: Judgement and  insight appear normal. Mood is appropriate for condition and setting          Labs on Admission:  Basic Metabolic Panel: Recent Labs  Lab 09/23/22 0232 09/27/22 1615 09/27/22 1817  NA 133* 125* 126*  K 4.2 4.1 3.7  CL 95* 92* 92*  CO2  --   --  22  GLUCOSE 99 150* 153*  BUN 10 17 18   CREATININE 0.60* 0.60* 0.77  CALCIUM  --   --  8.9   Liver Function Tests: No results for input(s): "AST", "ALT", "ALKPHOS", "BILITOT", "PROT", "ALBUMIN" in the last 168 hours. No results for input(s): "LIPASE", "AMYLASE" in the last 168 hours. No results for input(s): "AMMONIA" in the last 168 hours. CBC: Recent Labs  Lab 09/23/22 0232 09/27/22 1615 09/27/22 1817  WBC  --   --  17.2*  HGB 12.6* 10.9* 10.8*  HCT 37.0* 32.0* 30.4*  MCV  --   --  84.9  PLT  --   --  376   Cardiac Enzymes: No results for input(s): "CKTOTAL", "CKMB", "CKMBINDEX", "TROPONINI" in the last 168 hours.  BNP (last 3 results) No results for input(s): "BNP" in the last 8760 hours.  ProBNP (last 3 results) No results for input(s): "PROBNP" in the last 8760 hours.  CBG: No results for input(s): "GLUCAP" in the last 168 hours.  Radiological Exams on Admission: No results found.  EKG: I independently viewed the EKG done and my findings are as followed: Sinus tachycardia rate of 102.  Nonspecific ST-T changes.  QTc 485.  Assessment/Plan Present on Admission:  Gross hematuria  Principal Problem:   Gross hematuria  Recurrent gross hematuria Status post cystoscopy with laser lithotripsy of ureteral calculus, balloon dilatation of the ureteral stricture and transurethral resection of bladder tumor on 09/14/2022 by Dr. Arita Porter. Start continuous bladder irrigation as recommended by urology Closely monitor H&H, transfuse hemoglobin less than 8.0 Closely monitor vital signs, maintain MAP greater than 65 Appreciate urology's assistance Pain control as needed and bowel regimen as needed  Complicated UTI, POA UA  positive for pyuria Follow urine culture for ID and sensitivities Follow blood cultures peripherally x 2  Hypovolemic hyponatremia Serum sodium 126 Start IV fluid hydration NS at 75 cc/h x 2 days. Encourage oral protein calorie intake Repeat BMP twice daily  Hyperglycemia Presented with blood glucose of 153 No prior history of diabetes Obtain hemoglobin A1c Start insulin sliding scale if hyperglycemia persists, held off insulin sliding scale for now to avoid hypoglycemia.  History of bladder tumor status post transurethral resection on 09/14/2022 by Dr. Arita Porter Follow CT pelvis results Management per urology   DVT prophylaxis: SCDs  Code Status: Full code  Family Communication: Updated his daughter at bedside  Disposition Plan: Admitted to stepdown unit  Consults called: Urology  Admission status: Inpatient status   Status is: Inpatient The patient requires at least 2 midnights for further evaluation and treatment of present condition.   Darlin Drop MD [facility] Pager [phone]  If 7PM-7AM, please contact night-coverage [email or web address] Password [facility]  09/27/2022, 7:34 PM

## 2022-09-27 NOTE — ED Provider Notes (Signed)
[place] EMERGENCY DEPARTMENT AT [facility] Provider Note   CSN: [id number] Arrival date & time: 09/27/22  1413     History Chief Complaint  Patient presents with   Hematuria    Jeff[facility] is a 89 y.o. male. Patient presents to the ED with concerns for hematuria at catheter site. Had procedure to remove bladder tumor a few weeks again and has since been experiencing hematuria in catheter. Has been irrigated in the ED and with [facility] several times prior to this but continues to have recurrence of symptoms. Currently endorsing pain likely from urinary retention from catheter being blocked.   Hematuria       Home Medications Prior to Admission medications   Medication Sig Start Date End Date Taking? Authorizing Provider  acetaminophen (TYLENOL) 500 MG tablet Take 500 mg by mouth daily as needed for mild pain or headache.   Yes [provider]  BIOTIN PO Take 1 tablet by mouth daily. Patient not taking   Yes [provider]  Cholecalciferol (VITAMIN D3 SUPER STRENGTH) 50 MCG (2000 UT) CAPS Take 2,000 Units by mouth daily.   Yes [provider]  Cyanocobalamin (VITAMIN B-12 PO) Take 1 tablet by mouth daily.   Yes [provider]  latanoprost (XALATAN) 0.005 % ophthalmic solution Place 1 drop into both eyes at bedtime. Resume home eye drop at bedtime 09/28/22  Yes [provider]  Magnesium 250 MG TABS Take 250 mg by mouth daily.   Yes [provider]  Multiple Vitamins-Minerals (CENTRUM SILVER 50+MEN) TABS Take 1 tablet by mouth daily with breakfast.   Yes [provider]  niacin 100 MG tablet Take 100 mg by mouth at bedtime.   Yes [provider]  tamsulosin (FLOMAX) 0.4 MG CAPS capsule Take 1 capsule (0.4 mg total) by mouth at bedtime. 09/14/22  Yes Kasandra Knudsen D, MD  zinc gluconate 50 MG tablet Take 50 mg by mouth daily.   Yes [provider]  Carbidopa-Levodopa ER (SINEMET CR)  25-100 MG tablet controlled release TAKE 1 TABLET BY MOUTH THREE TIMES A DAY Patient not taking: Reported on 06/18/2021 03/13/18   Tat, Octaviano Batty, DO      Allergies    Timolol maleate    Review of Systems   Review of Systems  Genitourinary:  Positive for hematuria.  All other systems reviewed and are negative.   Physical Exam Updated Vital Signs BP (!) 149/56 (BP Location: Left Arm)   Pulse 90   Temp 98 F (36.7 C) (Oral)   Resp 18   Ht 5\' 2"  (1.575 m)   Wt 58.9 kg   SpO2 98%   BMI 23.75 kg/m  Physical Exam Vitals and nursing note reviewed.  HENT:     Head: Normocephalic and atraumatic.  Eyes:     General: No scleral icterus.       Right eye: No discharge.        Left eye: No discharge.  Cardiovascular:     Rate and Rhythm: Normal rate and regular rhythm.  Genitourinary:    Comments: Catheter in place with bloody drainage noted. No other obvious abnormality Skin:    Findings: No rash.     ED Results / Procedures / Treatments   Labs (all labs ordered are listed, but only abnormal results are displayed) Labs Reviewed  URINE CULTURE - Abnormal; Notable for the following components:      Result Value   Culture   (*)  Value: >=100,000 COLONIES/mL KLEBSIELLA PNEUMONIAE SUSCEPTIBILITIES TO FOLLOW Performed at [facility], [address] [place], [place] [address]    All other components within normal limits  URINALYSIS, ROUTINE W REFLEX MICROSCOPIC - Abnormal; Notable for the following components:   Color, Urine RED (*)    APPearance TURBID (*)    Glucose, UA   (*)    Value: TEST NOT REPORTED DUE TO COLOR INTERFERENCE OF URINE PIGMENT   Hgb urine dipstick LARGE (*)    Bilirubin Urine   (*)    Value: TEST NOT REPORTED DUE TO COLOR INTERFERENCE OF URINE PIGMENT   Ketones, ur   (*)    Value: TEST NOT REPORTED DUE TO COLOR INTERFERENCE OF URINE PIGMENT   Protein, ur   (*)    Value: TEST NOT REPORTED DUE TO COLOR INTERFERENCE OF URINE PIGMENT   Nitrite    (*)    Value: TEST NOT REPORTED DUE TO COLOR INTERFERENCE OF URINE PIGMENT   Leukocytes,Ua   (*)    Value: TEST NOT REPORTED DUE TO COLOR INTERFERENCE OF URINE PIGMENT   All other components within normal limits  URINALYSIS, MICROSCOPIC (REFLEX) - Abnormal; Notable for the following components:   Bacteria, UA MANY (*)    All other components within normal limits  BASIC METABOLIC PANEL - Abnormal; Notable for the following components:   Sodium 126 (*)    Chloride 92 (*)    Glucose, Bld 153 (*)    All other components within normal limits  CBC - Abnormal; Notable for the following components:   WBC 17.2 (*)    RBC 3.58 (*)    Hemoglobin 10.8 (*)    HCT 30.4 (*)    All other components within normal limits  HEMOGLOBIN AND HEMATOCRIT, BLOOD - Abnormal; Notable for the following components:   Hemoglobin 9.7 (*)    HCT 28.0 (*)    All other components within normal limits  CBC - Abnormal; Notable for the following components:   WBC 11.8 (*)    RBC 3.14 (*)    Hemoglobin 9.6 (*)    HCT 27.3 (*)    All other components within normal limits  BASIC METABOLIC PANEL - Abnormal; Notable for the following components:   Sodium 128 (*)    Potassium 3.4 (*)    Chloride 97 (*)    Creatinine, Ser 0.54 (*)    Calcium 8.3 (*)    All other components within normal limits  HEMOGLOBIN AND HEMATOCRIT, BLOOD - Abnormal; Notable for the following components:   Hemoglobin 9.7 (*)    HCT 28.4 (*)    All other components within normal limits  BASIC METABOLIC PANEL - Abnormal; Notable for the following components:   Sodium 129 (*)    CO2 21 (*)    Creatinine, Ser 0.53 (*)    Calcium 8.3 (*)    All other components within normal limits  HEMOGLOBIN AND HEMATOCRIT, BLOOD - Abnormal; Notable for the following components:   Hemoglobin 9.4 (*)    HCT 27.1 (*)    All other components within normal limits  BASIC METABOLIC PANEL - Abnormal; Notable for the following components:   Sodium 131 (*)     Glucose, Bld 138 (*)    Calcium 8.0 (*)    All other components within normal limits  GLUCOSE, CAPILLARY - Abnormal; Notable for the following components:   Glucose-Capillary 103 (*)    All other components within normal limits  HEMOGLOBIN AND HEMATOCRIT,  BLOOD - Abnormal; Notable for the following components:   Hemoglobin 8.5 (*)    HCT 25.4 (*)    All other components within normal limits  BASIC METABOLIC PANEL - Abnormal; Notable for the following components:   Sodium 134 (*)    Glucose, Bld 119 (*)    Calcium 8.1 (*)    All other components within normal limits  GLUCOSE, CAPILLARY - Abnormal; Notable for the following components:   Glucose-Capillary 111 (*)    All other components within normal limits  GLUCOSE, CAPILLARY - Abnormal; Notable for the following components:   Glucose-Capillary 118 (*)    All other components within normal limits  GLUCOSE, CAPILLARY - Abnormal; Notable for the following components:   Glucose-Capillary 129 (*)    All other components within normal limits  I-STAT CHEM 8, ED - Abnormal; Notable for the following components:   Sodium 125 (*)    Chloride 92 (*)    Creatinine, Ser 0.60 (*)    Glucose, Bld 150 (*)    Calcium, Ion 1.12 (*)    Hemoglobin 10.9 (*)    HCT 32.0 (*)    All other components within normal limits  CBG MONITORING, ED - Abnormal; Notable for the following components:   Glucose-Capillary 127 (*)    All other components within normal limits  CULTURE, BLOOD (ROUTINE X 2)  CULTURE, BLOOD (ROUTINE X 2)  MRSA NEXT GEN BY PCR, NASAL  MAGNESIUM  PHOSPHORUS  HEMOGLOBIN A1C  GLUCOSE, CAPILLARY  GLUCOSE, CAPILLARY  GLUCOSE, CAPILLARY  HEMOGLOBIN AND HEMATOCRIT, BLOOD    EKG EKG Interpretation  Date/Time:  Monday September 27 2022 18:08:21 EDT Ventricular Rate:  102 PR Interval:  137 QRS Duration: 132 QT Interval:  372 QTC Calculation: 485 R Axis:   -51 Text Interpretation: Sinus tachycardia Probable left atrial enlargement  Right bundle branch block Confirmed by Alvester Chou [phone]) on 09/27/2022 6:19:01 PM  Radiology No results found.  Procedures Procedures   Medications Ordered in ED Medications  sodium chloride irrigation 0.9 % 3,000 mL (3,000 mLs Irrigation New Bag/Given 09/29/22 0647)  tamsulosin (FLOMAX) capsule 0.4 mg (0.4 mg Oral Patient Refused/Not Given 09/29/22 2121)  cyanocobalamin (VITAMIN B12) tablet 1,000 mcg ( Oral MAR Unhold 09/29/22 1459)  acetaminophen (TYLENOL) tablet 650 mg ( Oral MAR Unhold 09/29/22 1459)  prochlorperazine (COMPAZINE) injection 5 mg ( Intravenous MAR Unhold 09/29/22 1459)  melatonin tablet 5 mg (5 mg Oral Given 09/29/22 2121)  oxyCODONE (Oxy IR/ROXICODONE) immediate release tablet 5 mg ( Oral MAR Unhold 09/29/22 1459)  polyethylene glycol (MIRALAX / GLYCOLAX) packet 17 g ( Oral MAR Unhold 09/29/22 1459)  HYDROmorphone (DILAUDID) injection 0.5 mg ( Intravenous MAR Unhold 09/29/22 1459)  0.9 %  sodium chloride infusion ( Intravenous Rate/Dose Change 09/29/22 1650)  cefTRIAXone (ROCEPHIN) 2 g in sodium chloride 0.9 % 100 mL IVPB (2 g Intravenous New Bag/Given 09/29/22 2125)  Chlorhexidine Gluconate Cloth 2 % PADS 6 each ( Topical MAR Unhold 09/29/22 1459)  cholecalciferol (VITAMIN D3) 25 MCG (1000 UNIT) tablet 2,000 Units (2,000 Units Oral Given 09/29/22 1850)  latanoprost (XALATAN) 0.005 % ophthalmic solution 1 drop (1 drop Both Eyes Given 09/29/22 2130)  magnesium oxide (MAG-OX) tablet 400 mg (400 mg Oral Given 09/29/22 1849)  multivitamin with minerals tablet 1 tablet (has no administration in time range)  niacin (VITAMIN B3) tablet 100 mg (has no administration in time range)  zinc sulfate capsule 220 mg (220 mg Oral Given 09/29/22 1850)  oxyCODONE-acetaminophen (PERCOCET/ROXICET) 5-325 MG per  tablet 1 tablet (1 tablet Oral Given 09/27/22 1603)  HYDROmorphone (DILAUDID) injection 1 mg (1 mg Intravenous Given 09/27/22 1732)  sodium chloride 0.9 % bolus 1,000 mL (0 mLs Intravenous Stopped 09/27/22 2040)   cefTRIAXone (ROCEPHIN) 1 g in sodium chloride 0.9 % 100 mL IVPB (0 g Intravenous Stopped 09/27/22 2015)  acetaminophen (TYLENOL) tablet 1,000 mg (1,000 mg Oral Given 09/29/22 1127)    ED Course/ Medical Decision Making/ A&P Clinical Course as of 09/29/22 2229  Mon Sep 27, 2022  1858 Dr. Lafonda Mosses evaluated patient and irrigated bladder. Will order CT pelvis and plan on admission. [OZ]  1902 Admission for hyponatremia, need for continued bladder irrigation by urology, suspected infection given elevation in WBC and presence of bacteria on initial UA [OZ]  1917 Urologist at bedside think is reasonable to admit for bladder irrigation, continuous irrigation, also think is reasonable to treat for potential UTI, his UA is too bloody for decisive results but the patient does have a leukocytosis and has had recent instrumentation over and over with Foley placements.  Urologist recommended CT pelvis to evaluate for persistent clots within the bladder.  Patient will be admitted at this point for hyponatremia, bladder irrigation, and urinary infection (possible)  [MT]  62 Spoke with Dr. Dow Adolph, hospitalist, who will be admitting patient. [OZ]    Clinical Course User Index [MT] Trifan, Kermit Balo, MD [OZ] Smitty Knudsen, PA-C                           Medical Decision Making Amount and/or Complexity of Data Reviewed Labs: ordered. Radiology: ordered. ECG/medicine tests: ordered.  Risk Prescription drug management. Decision regarding hospitalization.   This patient presents to the ED for concern of hematuria.  Differential diagnosis includes acute cystitis, complication of Foley catheter, post urologic surgery complication   Lab Tests:  I Ordered, and personally interpreted labs.  The pertinent results include: CBC with notable leukocytosis noted concerning for infection, UA difficult to decipher given quantity of blood present but some bacteria potentially present.  CMP with notable  hyponatremia.   Imaging Studies ordered:  I ordered imaging studies including CT pelvis I independently visualized and interpreted imaging which showed no obvious or acute abnormalities noted in the pelvis/bladder I agree with the radiologist interpretation   Medicines ordered and prescription drug management:  I ordered medication including Dilaudid, Rocephin, fluids for pain, suspected UTI, dehydration Reevaluation of the patient after these medicines showed that the patient improved I have reviewed the patients home medicines and have made adjustments as needed   Problem List / ED Course:  Patient presents to the emergency department complaints of hematuria.  He has been seen extensively for similar concerns after recent urological procedure to remove a tumor from his bladder.  He reports that he once again started having bleeding from the catheter site but no flow is currently present from the site.  He is reporting significant pain in the suprapubic site. Labs somewhat concerning with leukocytosis noted at 17.8 and CMP with significant hyponatremia.  Unclear etiology of these but I suspect that there may be a potential infection causing leukocytosis.  Urinalysis does show evidence of bacteria but otherwise unable to assess if there is any leukocytes or other abnormality to indicate infection. Given concerning workup and presentation, will consult with urology for further recommendations and potential evaluation of the patient here in the emergency department. Spoke with Dr. Lafonda Mosses, urology, who advised replacing patient's  Foley after unsuccessful irrigation with three-way catheter.  Dr. Lafonda Mosses will also evaluate patient here in the emergency department and perform any treatment as needed. Dr. Judith Blonder recommendation is for him admission for continuous irrigation of the bladder as well as for antibiotic therapy given concerning findings of bacteria with leukocytosis at 17. Spoke with Dr.  Margo Aye, hospitalist, who will be admitting patient for further management. Informed patient and family of recommendations we will plan on admission at this time.  Patient and family are agreeable treatment plan verbalized understanding neck steps.  Final Clinical Impression(s) / ED Diagnoses Final diagnoses:  Hematuria, unspecified type  Hyponatremia    Rx / DC Orders ED Discharge Orders     None         Salomon Mast 09/29/22 2229    Glendora Score, MD 10/07/22 1408

## 2022-09-27 NOTE — ED Triage Notes (Signed)
Patient from [place]. Patient c/o bloody drainage at foley insertion site and blood in drainage bag. Patient reports pain at insertion site. Blood noted to clothing and in drainage bag. Patient states he keeps getting clots in foley since insertion and reports he has been seen before for same problem

## 2022-09-28 ENCOUNTER — Other Ambulatory Visit: Payer: Self-pay

## 2022-09-28 DIAGNOSIS — R319 Hematuria, unspecified: Secondary | ICD-10-CM

## 2022-09-28 DIAGNOSIS — L8990 Pressure ulcer of unspecified site, unspecified stage: Secondary | ICD-10-CM | POA: Insufficient documentation

## 2022-09-28 DIAGNOSIS — N390 Urinary tract infection, site not specified: Secondary | ICD-10-CM | POA: Diagnosis not present

## 2022-09-28 DIAGNOSIS — R739 Hyperglycemia, unspecified: Secondary | ICD-10-CM | POA: Insufficient documentation

## 2022-09-28 DIAGNOSIS — E871 Hypo-osmolality and hyponatremia: Secondary | ICD-10-CM | POA: Diagnosis not present

## 2022-09-28 DIAGNOSIS — D494 Neoplasm of unspecified behavior of bladder: Secondary | ICD-10-CM | POA: Insufficient documentation

## 2022-09-28 DIAGNOSIS — R310 Gross hematuria: Secondary | ICD-10-CM | POA: Diagnosis not present

## 2022-09-28 LAB — CBC
HCT: 27.3 % — ABNORMAL LOW (ref 39.0–52.0)
Hemoglobin: 9.6 g/dL — ABNORMAL LOW (ref 13.0–17.0)
MCH: 30.6 pg (ref 26.0–34.0)
MCHC: 35.2 g/dL (ref 30.0–36.0)
MCV: 86.9 fL (ref 80.0–100.0)
Platelets: 352 10*3/uL (ref 150–400)
RBC: 3.14 MIL/uL — ABNORMAL LOW (ref 4.22–5.81)
RDW: 12.5 % (ref 11.5–15.5)
WBC: 11.8 10*3/uL — ABNORMAL HIGH (ref 4.0–10.5)
nRBC: 0 % (ref 0.0–0.2)

## 2022-09-28 LAB — BASIC METABOLIC PANEL
Anion gap: 7 (ref 5–15)
Anion gap: 9 (ref 5–15)
Anion gap: 9 (ref 5–15)
BUN: 14 mg/dL (ref 8–23)
BUN: 11 mg/dL (ref 8–23)
BUN: 12 mg/dL (ref 8–23)
CO2: 24 mmol/L (ref 22–32)
CO2: 21 mmol/L — ABNORMAL LOW (ref 22–32)
CO2: 22 mmol/L (ref 22–32)
Calcium: 8.3 mg/dL — ABNORMAL LOW (ref 8.9–10.3)
Calcium: 8.3 mg/dL — ABNORMAL LOW (ref 8.9–10.3)
Calcium: 8 mg/dL — ABNORMAL LOW (ref 8.9–10.3)
Chloride: 97 mmol/L — ABNORMAL LOW (ref 98–111)
Chloride: 99 mmol/L (ref 98–111)
Chloride: 100 mmol/L (ref 98–111)
Creatinine, Ser: 0.54 mg/dL — ABNORMAL LOW (ref 0.61–1.24)
Creatinine, Ser: 0.53 mg/dL — ABNORMAL LOW (ref 0.61–1.24)
Creatinine, Ser: 0.75 mg/dL (ref 0.61–1.24)
GFR, Estimated: >60 mL/min (ref >60)
GFR, Estimated: >60 mL/min (ref >60)
GFR, Estimated: >60 mL/min (ref >60)
Glucose, Bld: 91 mg/dL (ref 70–99)
Glucose, Bld: 92 mg/dL (ref 70–99)
Glucose, Bld: 138 mg/dL — ABNORMAL HIGH (ref 70–99)
Potassium: 3.4 mmol/L — ABNORMAL LOW (ref 3.5–5.1)
Potassium: 3.6 mmol/L (ref 3.5–5.1)
Potassium: 3.6 mmol/L (ref 3.5–5.1)
Sodium: 128 mmol/L — ABNORMAL LOW (ref 135–145)
Sodium: 129 mmol/L — ABNORMAL LOW (ref 135–145)
Sodium: 131 mmol/L — ABNORMAL LOW (ref 135–145)

## 2022-09-28 LAB — PHOSPHORUS: Phosphorus: 2.7 mg/dL (ref 2.5–4.6)

## 2022-09-28 LAB — CULTURE, BLOOD (ROUTINE X 2): Special Requests: ADEQUATE

## 2022-09-28 LAB — GLUCOSE, CAPILLARY
Glucose-Capillary: 84 mg/dL (ref 70–99)
Glucose-Capillary: 97 mg/dL (ref 70–99)
Glucose-Capillary: 103 mg/dL — ABNORMAL HIGH (ref 70–99)
Glucose-Capillary: 98 mg/dL (ref 70–99)

## 2022-09-28 LAB — MAGNESIUM: Magnesium: 1.8 mg/dL (ref 1.7–2.4)

## 2022-09-28 LAB — MRSA NEXT GEN BY PCR, NASAL: MRSA by PCR Next Gen: NOT DETECTED

## 2022-09-28 LAB — HEMOGLOBIN AND HEMATOCRIT, BLOOD
HCT: 28.4 % — ABNORMAL LOW (ref 39.0–52.0)
HCT: 27.1 % — ABNORMAL LOW (ref 39.0–52.0)
Hemoglobin: 9.7 g/dL — ABNORMAL LOW (ref 13.0–17.0)
Hemoglobin: 9.4 g/dL — ABNORMAL LOW (ref 13.0–17.0)

## 2022-09-28 MED ORDER — CHLORHEXIDINE GLUCONATE CLOTH 2 % EX PADS
6.0000 | MEDICATED_PAD | Freq: Every day | CUTANEOUS | Status: DC
  Administered 2022-09-28 – 2022-10-05 (×8): 6 via TOPICAL

## 2022-09-28 NOTE — Evaluation (Signed)
Physical Therapy Evaluation Patient Details Name: Jeffrey Porter MRN: [id number] DOB: [birth year] Today's Date: 09/28/2022  History of Present Illness  89 y.o. male past medical history significant for urethral stricture urethral calculi bladder tumor status post cystoscopy with laser treated.  See final dilation of urethra secondary to stricture and transurethral resection of bladder tumor on 09/14/2022 discharged home with a Foley catheter since then has had gross hematuria suprapubic pain urology was consulted and saw the patient irrigated the patient and to small blood clots came out.  Urology recommended CT of the abdomen and pelvis and bladder irrigation as due to Foley many pretension he is at high risk of UTI.  Clinical Impression  Pt admitted with above diagnosis. Mod assist for supine to sit, pt sat edge of bed ~5 minutes with posterior lean requiring min assist to correct. Pt declined sit to stand stating he's too weak. He reports he walks short distances with a RW in his ALF and is wheeled to dining room in a WC.  Pt currently with functional limitations due to the deficits listed below (see PT Problem List). Pt will benefit from acute skilled PT to increase their independence and safety with mobility to allow discharge.          Recommendations for follow up therapy are one component of a multi-disciplinary discharge planning process, led by the attending physician.  Recommendations may be updated based on patient status, additional functional criteria and insurance authorization.  Follow Up Recommendations Can patient physically be transported by private vehicle: No     Assistance Recommended at Discharge Frequent or constant Supervision/Assistance  Patient can return home with the following  A lot of help with walking and/or transfers;A lot of help with bathing/dressing/bathroom;Assistance with cooking/housework;Assist for transportation;Help with stairs or ramp for entrance     Equipment Recommendations None recommended by PT  Recommendations for Other Services       Functional Status Assessment Patient has had a recent decline in their functional status and demonstrates the ability to make significant improvements in function in a reasonable and predictable amount of time.     Precautions / Restrictions Precautions Precautions: Fall Precaution Comments: pt denies falls in past 6 months Restrictions Weight Bearing Restrictions: No      Mobility  Bed Mobility Overal bed mobility: Needs Assistance Bed Mobility: Supine to Sit     Supine to sit: Max assist     General bed mobility comments: assist to raise trunk and pivot hips to edge of bed (pt 20%), pt had posterior lean at edge of bed requiring min A, he stated he was too weak to attempt standing    Transfers                        Ambulation/Gait                  Stairs            Wheelchair Mobility    Modified Rankin (Stroke Patients Only)       Balance Overall balance assessment: Needs assistance Sitting-balance support: Feet supported, Single extremity supported Sitting balance-Leahy Scale: Poor Sitting balance - Comments: min A to maintain trunk in neutral 2* posterior lean Postural control: Posterior lean                                   Pertinent Vitals/Pain Pain Assessment Pain  Assessment: No/denies pain    Home Living Family/patient expects to be discharged to:: Assisted living                 Home Equipment: Wheelchair - Forensic psychologist (2 wheels) Additional Comments: walks short distances with walker in apartment, is wheeled in [facility] to dining room    Prior Function Prior Level of Function : Needs assist             Mobility Comments: walks short distances with walker in apartment, is wheeled in [facility] to dining room ADLs Comments: assist for bathing/dressing     Hand Dominance        Extremity/Trunk  Assessment   Upper Extremity Assessment Upper Extremity Assessment: Generalized weakness    Lower Extremity Assessment Lower Extremity Assessment: Generalized weakness;RLE deficits/detail;LLE deficits/detail RLE Deficits / Details: knee ext AROM -15*, pt reports h/o arthritis RLE Sensation: WNL LLE Deficits / Details: knee ext AROM -15*, pt reports h/o arthritis LLE Sensation: WNL       Communication   Communication: Expressive difficulties;Other (comment) (some stuttering, increased time to find words)  Cognition Arousal/Alertness: Awake/alert Behavior During Therapy: WFL for tasks assessed/performed Overall Cognitive Status: Within Functional Limits for tasks assessed                                          General Comments      Exercises General Exercises - Lower Extremity Long Arc Quad: AAROM, Both, 10 reps, Seated Heel Slides: AAROM, Both, 5 reps, Supine   Assessment/Plan    PT Assessment Patient needs continued PT services  PT Problem List Decreased strength;Decreased activity tolerance;Decreased balance;Decreased mobility       PT Treatment Interventions Gait training;Therapeutic exercise;Functional mobility training;Patient/family education;Therapeutic activities;DME instruction    PT Goals (Current goals can be found in the Care Plan section)  Acute Rehab PT Goals Patient Stated Goal: to get stronger PT Goal Formulation: With patient Time For Goal Achievement: 10/12/22 Potential to Achieve Goals: Good    Frequency Min 1X/week     Co-evaluation               AM-PAC PT "6 Clicks" Mobility  Outcome Measure Help needed turning from your back to your side while in a flat bed without using bedrails?: A Lot Help needed moving from lying on your back to sitting on the side of a flat bed without using bedrails?: A Lot Help needed moving to and from a bed to a chair (including a wheelchair)?: Total Help needed standing up from a chair  using your arms (e.g., wheelchair or bedside chair)?: Total Help needed to walk in hospital room?: Total Help needed climbing 3-5 steps with a railing? : Total 6 Click Score: 8    End of Session   Activity Tolerance: Patient limited by fatigue Patient left: in bed;with call bell/phone within reach;with bed alarm set Nurse Communication: Mobility status PT Visit Diagnosis: Difficulty in walking, not elsewhere classified (R26.2)    Time: 1610-9604 PT Time Calculation (min) (ACUTE ONLY): 12 min   Charges:   PT Evaluation $PT Eval Moderate Complexity: 1 Mod         Tamala Ser PT 09/28/2022  Acute Rehabilitation Services  Office [phone]

## 2022-09-28 NOTE — ED Notes (Signed)
Rounded on pt, pt currently sleeping, eyes closed, respirations equal and unlabored, No acute distress noted. Will continue to monitor. CBI currently flowing w/o issue.

## 2022-09-28 NOTE — Progress Notes (Signed)
[facility] PROGRESS NOTE    Progress Note  Jeffrey Porter  ZOX:[id number] DOB: 1931/10/23 DOA: 09/27/2022 PCP: Sigmund Hazel, MD     Brief Narrative:   Jeffrey Porter is an 89 y.o. male past medical history significant for urethral stricture urethral calculi bladder tumor status post cystoscopy with laser treated.  See final dilation of urethra secondary to stricture and transurethral resection of bladder tumor on 09/14/2022 discharged home with a Foley catheter since then has had gross hematuria suprapubic pain urology was consulted and saw the patient irrigated the patient and to small blood clots came out.  Urology recommended CT of the abdomen and pelvis and bladder irrigation as due to Foley many pretension he is at high risk of UTI.  Started on CBI     Assessment/Plan:   Recurrent gross hematuria: Status post cystoscopy with lithotripsy, balloon dilation and urethral resection of bladder tumor on 09/14/2022 by Dr. Jenne Pane. Urology was consulted recommended CBI and monitor hemoglobin. They also recommended IV antibiotics as he high risk of UTI due to recurrent more Foley manipulation. Hemoglobin is relatively stable this morning is 9.6 continue to monitor.  Complicated UTI (urinary tract infection): UA positive for pyuria urine culture has been sent. Blood cultures have been sent. Start empirically IV Rocephin. Has remained afebrile leukocytosis improving.  Hypovolemic hyponatremia: Started on IV fluids, improving with IV fluid.  History of bladder tumor status post resection on 09/14/2022: CT abdomen and pelvis showed bladder contracted did, prostate seeding implants.  Hyperglycemia Continue check CBGs.  DVT prophylaxis: scd Family Communication:none Status is: Inpatient Remains inpatient appropriate because: Gross hematuria    Code Status:     Code Status Orders  (From admission, onward)           Start     Ordered   09/27/22 1933  Full code  Continuous        Question:  By:  Answer:  Consent: discussion documented in EHR   09/27/22 1933           Code Status History     Date Active Date Inactive Code Status Order ID Comments User Context   06/19/2021 0156 06/23/2021 2157 DNR [id number]  Briscoe Deutscher, MD ED   12/21/2017 2113 12/23/2017 1827 Full Code [id number]  Rometta Emery, MD Inpatient   12/06/2017 2317 12/15/2017 1529 Full Code [id number]  Pearson Grippe, MD ED      Advance Directive Documentation    Flowsheet Row Most Recent Value  Type of Advance Directive Healthcare Power of Attorney  Pre-existing out of facility DNR order (yellow form or pink MOST form) Yellow form placed in chart (order not valid for inpatient use)  "MOST" Form in Place? --         IV Access:   Peripheral IV   Procedures and diagnostic studies:   CT PELVIS WO CONTRAST  Result Date: 09/27/2022 CLINICAL DATA:  Evaluating for bladder clots post urologic procedure. Bloody drainage at Foley insertion site. EXAM: CT PELVIS WITHOUT CONTRAST TECHNIQUE: Multidetector CT imaging of the pelvis was performed following the standard protocol without intravenous contrast. RADIATION DOSE REDUCTION: This exam was performed according to the departmental dose-optimization program which includes automated exposure control, adjustment of the mA and/or kV according to patient size and/or use of iterative reconstruction technique. COMPARISON:  08/25/2022 FINDINGS: Urinary Tract: Foley catheter with inflated balloon in the bladder. The bladder is decompressed. Decompression of the bladder limits evaluation but no discrete abnormality is identified. Bowel: Visualized large and  small bowel are not abnormally distended. No inflammatory changes are appreciated. The appendix is normal. Vascular/Lymphatic: Calcification of the iliac arteries. No aneurysm. No significant lymphadenopathy. Reproductive:  Seed implants in the prostate gland. Other: No free air or free fluid in the pelvis.  Postoperative changes in the left groin. Musculoskeletal: Degenerative changes in the spine. No focal bone lesions. IMPRESSION: 1. Bladder is contracted around a Foley catheter, limiting evaluation. No discrete abnormality is identified as visualized. If there is clinical concern for residual bladder lesion or hematoma, consider repeat imaging with clamping of the Foley catheter. 2. Prostate seed implants. 3. Atherosclerotic changes in the iliac arteries. Electronically Signed   By: Burman Nieves M.D.   On: 09/27/2022 19:49     Medical Consultants:   None.   Subjective:    Sun Afifi Feels great no complaints.  Objective:    Vitals:   09/28/22 0330 09/28/22 0512 09/28/22 0530 09/28/22 0622  BP: (!) 111/48  (!) 125/58 (!) 120/46  Pulse: 73  74 80  Resp: 11  12 20   Temp:  98.1 F (36.7 C)  97.8 F (36.6 C)  TempSrc:  Oral  Oral  SpO2: 95%  93% 100%  Weight:    58.9 kg  Height:       SpO2: 100 %   Intake/Output Summary (Last 24 hours) at 09/28/2022 0711 Last data filed at 09/28/2022 4098 Gross per 24 hour  Intake 2053.4 ml  Output 11914 ml  Net -9746.6 ml   Filed Weights   09/27/22 1428 09/28/22 0622  Weight: 66 kg 58.9 kg    Exam: General exam: In no acute distress. Respiratory system: Good air movement and clear to auscultation. Cardiovascular system: S1 & S2 heard, RRR. No JVD. Gastrointestinal system: Abdomen is nondistended, soft and nontender.  Extremities: No pedal edema. Skin: No rashes, lesions or ulcers Psychiatry: Judgement and insight appear normal. Mood & affect appropriate.    Data Reviewed:    Labs: Basic Metabolic Panel: Recent Labs  Lab 09/23/22 0232 09/27/22 1615 09/27/22 1817 09/28/22 0533  NA 133* 125* 126* 128*  K 4.2 4.1 3.7 3.4*  CL 95* 92* 92* 97*  CO2  --   --  22 24  GLUCOSE 99 150* 153* 91  BUN 10 17 18 14   CREATININE 0.60* 0.60* 0.77 0.54*  CALCIUM  --   --  8.9 8.3*  MG  --   --   --  1.8  PHOS  --   --   --  2.7    GFR Estimated Creatinine Clearance: 49.3 mL/min (A) (by C-G formula based on SCr of 0.54 mg/dL (L)). Liver Function Tests: No results for input(s): "AST", "ALT", "ALKPHOS", "BILITOT", "PROT", "ALBUMIN" in the last 168 hours. No results for input(s): "LIPASE", "AMYLASE" in the last 168 hours. No results for input(s): "AMMONIA" in the last 168 hours. Coagulation profile No results for input(s): "INR", "PROTIME" in the last 168 hours. COVID-19 Labs  No results for input(s): "DDIMER", "FERRITIN", "LDH", "CRP" in the last 72 hours.  Lab Results  Component Value Date   SARSCOV2NAA NEGATIVE 06/18/2021   SARSCOV2NAA NEGATIVE 08/03/2019    CBC: Recent Labs  Lab 09/23/22 0232 09/27/22 1615 09/27/22 1817 09/27/22 2035 09/28/22 0533  WBC  --   --  17.2*  --  11.8*  HGB 12.6* 10.9* 10.8* 9.7* 9.6*  HCT 37.0* 32.0* 30.4* 28.0* 27.3*  MCV  --   --  84.9  --  86.9  PLT  --   --  376  --  352   Cardiac Enzymes: No results for input(s): "CKTOTAL", "CKMB", "CKMBINDEX", "TROPONINI" in the last 168 hours. BNP (last 3 results) No results for input(s): "PROBNP" in the last 8760 hours. CBG: Recent Labs  Lab 09/27/22 2317  GLUCAP 127*   D-Dimer: No results for input(s): "DDIMER" in the last 72 hours. Hgb A1c: No results for input(s): "HGBA1C" in the last 72 hours. Lipid Profile: No results for input(s): "CHOL", "HDL", "LDLCALC", "TRIG", "CHOLHDL", "LDLDIRECT" in the last 72 hours. Thyroid function studies: No results for input(s): "TSH", "T4TOTAL", "T3FREE", "THYROIDAB" in the last 72 hours.  Invalid input(s): "FREET3" Anemia work up: No results for input(s): "VITAMINB12", "FOLATE", "FERRITIN", "TIBC", "IRON", "RETICCTPCT" in the last 72 hours. Sepsis Labs: Recent Labs  Lab 09/27/22 1817 09/28/22 0533  WBC 17.2* 11.8*   Microbiology No results found for this or any previous visit (from the past 240 hour(s)).   Medications:    vitamin B-12  1,000 mcg Oral Daily    tamsulosin  0.4 mg Oral QHS   Continuous Infusions:  sodium chloride 75 mL/hr at 09/28/22 1610   cefTRIAXone (ROCEPHIN)  IV     sodium chloride irrigation 0 mL (09/28/22 0519)      LOS: 1 day   Marinda Elk  [facility]  09/28/2022, 7:11 AM

## 2022-09-28 NOTE — ED Notes (Addendum)
Instructed by Dr.Hall to pause CBI at this time due to significant improvement to pt's urinary output currently bright pink, clear, no clots.

## 2022-09-28 NOTE — NC FL2 (Signed)
[place] MEDICAID FL2 LEVEL OF CARE FORM     IDENTIFICATION  Patient Name: Jeffrey Porter Birthdate: Aug 23, 1931 Sex: male Admission Date (Current Location): 09/27/2022  [facility] and [place] Number:  Producer, television/film/video and Address:  [facility],  [address] [place], [place] [address]      Provider Number: [id number]  Attending Physician Name and Address:  Noel Christmas, MD  Relative Name and Phone Number:  [name] Bouamri(dtr)[phone]    Current Level of Care: Hospital Recommended Level of Care: Skilled Nursing Facility Prior Approval Number:    Date Approved/Denied:   PASRR Number: [id number]  Discharge Plan: SNF    Current Diagnoses: Patient Active Problem List   Diagnosis Date Noted   Pressure injury of skin 09/28/2022   Hyperglycemia 09/28/2022   Bladder tumor 09/28/2022   Gross hematuria 09/23/2022   CAP (community acquired pneumonia) 06/20/2021   Debility 06/20/2021   Pneumonia 06/20/2021   Hypokalemia 06/19/2021   Hypocalcemia 06/19/2021   Elevated LFTs 06/19/2021   SIRS (systemic inflammatory response syndrome) (HCC) 06/18/2021   Essential hypertension 12/22/2017   History of prostate cancer 12/22/2017   Glaucoma 12/22/2017   Aortic stenosis 12/22/2017   OA (osteoarthritis) 12/22/2017   Abnormal LFTs 12/22/2017   Thrombocytosis 12/22/2017   Normocytic anemia 12/22/2017   Hyponatremia 12/22/2017   Cellulitis and abscess of left leg 12/21/2017   Left ankle swelling    Acute respiratory failure with hypoxia (HCC)    Paroxysmal supraventricular tachycardia    Sepsis (HCC) 12/06/2017   Complicated UTI (urinary tract infection) 12/06/2017   Tachycardia 12/06/2017   Leukocytosis 12/06/2017    Orientation RESPIRATION BLADDER Height & Weight     Self, Time, Situation, Place  Normal Indwelling catheter Weight: 58.9 kg Height:  5\' 2"  (157.5 cm)  BEHAVIORAL SYMPTOMS/MOOD NEUROLOGICAL BOWEL NUTRITION STATUS      Continent Diet (Regular)   AMBULATORY STATUS COMMUNICATION OF NEEDS Skin   Limited Assist Verbally Normal                       Personal Care Assistance Level of Assistance  Bathing, Feeding, Dressing Bathing Assistance: Limited assistance Feeding assistance: Limited assistance Dressing Assistance: Limited assistance     Functional Limitations Info  Sight, Hearing, Speech Sight Info: Impaired (eyeglasses) Hearing Info: Adequate Speech Info: Impaired (partials-top;some spanish & english speaking;some stuttering)    SPECIAL CARE FACTORS FREQUENCY  PT (By licensed PT), OT (By licensed OT)     PT Frequency: 5x week OT Frequency: 5x week            Contractures Contractures Info: Not present    Additional Factors Info  Code Status, Allergies Code Status Info: Full Allergies Info: Timolol Maleate           Current Medications (09/28/2022):  This is the current hospital active medication list Current Facility-Administered Medications  Medication Dose Route Frequency Provider Last Rate Last Admin   0.9 %  sodium chloride infusion   Intravenous Continuous Darlin Drop, DO 75 mL/hr at 09/28/22 9528 New Bag at 09/28/22 4132   acetaminophen (TYLENOL) tablet 650 mg  650 mg Oral Q6H PRN Dow Adolph N, DO       cefTRIAXone (ROCEPHIN) 2 g in sodium chloride 0.9 % 100 mL IVPB  2 g Intravenous Q24H Hall, Carole N, DO       Chlorhexidine Gluconate Cloth 2 % PADS 6 each  6 each Topical Daily Noel Christmas, MD  6 each at 09/28/22 0945   cyanocobalamin (VITAMIN B12) tablet 1,000 mcg  1,000 mcg Oral Daily Darlin Drop, DO   1,000 mcg at 09/28/22 0945   HYDROmorphone (DILAUDID) injection 0.5 mg  0.5 mg Intravenous Q4H PRN Dow Adolph N, DO       melatonin tablet 5 mg  5 mg Oral QHS PRN Dow Adolph N, DO       oxyCODONE (Oxy IR/ROXICODONE) immediate release tablet 5 mg  5 mg Oral Q6H PRN Dow Adolph N, DO       polyethylene glycol (MIRALAX / GLYCOLAX) packet 17 g  17 g Oral Daily PRN Dow Adolph N,  DO       prochlorperazine (COMPAZINE) injection 5 mg  5 mg Intravenous Q6H PRN Dow Adolph N, DO   5 mg at 09/27/22 2117   sodium chloride irrigation 0.9 % 3,000 mL  3,000 mL Irrigation Continuous Terald Sleeper, MD 0 mL/hr at 09/28/22 0519 Restarted at 09/28/22 0627   tamsulosin (FLOMAX) capsule 0.4 mg  0.4 mg Oral QHS Dow Adolph N, DO   0.4 mg at 09/27/22 2124     Discharge Medications: Please see discharge summary for a list of discharge medications.  Relevant Imaging Results:  Relevant Lab Results:   Additional Information ss#[id number] [place], [place]

## 2022-09-28 NOTE — ED Notes (Signed)
ED TO INPATIENT HANDOFF REPORT  Name/Age/Gender Jeffrey Porter 89 y.o. male  Code Status    Code Status Orders  (From admission, onward)           Start     Ordered   09/27/22 1933  Full code  Continuous       Question:  By:  Answer:  Consent: discussion documented in EHR   09/27/22 1933           Code Status History     Date Active Date Inactive Code Status Order ID Comments User Context   06/19/2021 0156 06/23/2021 2157 DNR [id number]  Briscoe Deutscher, MD ED   12/21/2017 2113 12/23/2017 1827 Full Code [id number]  Rometta Emery, MD Inpatient   12/06/2017 2317 12/15/2017 1529 Full Code [id number]  Pearson Grippe, MD ED      Advance Directive Documentation    Flowsheet Row Most Recent Value  Type of Advance Directive Healthcare Power of Attorney  Pre-existing out of facility DNR order (yellow form or pink MOST form) Yellow form placed in chart (order not valid for inpatient use)  "MOST" Form in Place? --       Home/SNF/Other Skilled nursing facility  Chief Complaint Gross hematuria [R31.0]  Level of Care/Admitting Diagnosis ED Disposition     ED Disposition  Admit   Condition  --   Comment  [facility] [[id number]]  Level of Care: Progressive [102]  Admit to Progressive based on following criteria: COMPLICATED UROLOGY Patients requiring frequent assessments and interventions, such as continuous bladder irrigations, immediate post-op surgical procedures, i.e., bladder removal/ileal conduit.  May admit patient to [facility] or [facility] if equivalent level of care is available:: No  Covid Evaluation: Asymptomatic - no recent exposure (last 10 days) testing not required  Diagnosis: Gross hematuria [599.71.ICD-9-CM]  Admitting Physician: Darlin Drop [[id number]]  Attending Physician: Darlin Drop [[id number]]  Certification:: I certify this patient will need inpatient services for at least 2 midnights          Medical  History Past Medical History:  Diagnosis Date   Baker's cyst of knee, left    Glaucoma    Hypertension    Neuromuscular disorder (HCC)    peripheral neuropathy   Prostate cancer (HCC)     Allergies Allergies  Allergen Reactions   Timolol Maleate Other (See Comments)    Syncope     IV Location/Drains/Wounds Patient Lines/Drains/Airways Status     Active Line/Drains/Airways     Name Placement date Placement time Site Days   Peripheral IV 09/27/22 18 G 1.16" Left Antecubital 09/27/22  --  Antecubital  1   Urethral Catheter Tresa Moore RN Non-latex 22 Fr. 09/27/22  1810  Non-latex  1   Wound / Incision (Open or Dehisced) 06/19/21 (MASD) Moisture Associated Skin Damage Buttocks Bilateral;Upper 06/19/21  1445  Buttocks  466            Labs/Imaging Results for orders placed or performed during the hospital encounter of 09/27/22 (from the past 48 hour(s))  Urinalysis, Routine w reflex microscopic -Urine, Clean Catch     Status: Abnormal   Collection Time: 09/27/22  3:28 PM  Result Value Ref Range   Color, Urine RED (A) YELLOW    Comment: BIOCHEMICALS MAY BE AFFECTED BY COLOR   APPearance TURBID (A) CLEAR   Specific Gravity, Urine  1.005 - 1.030    TEST NOT REPORTED DUE TO COLOR INTERFERENCE OF URINE PIGMENT  pH  5.0 - 8.0    TEST NOT REPORTED DUE TO COLOR INTERFERENCE OF URINE PIGMENT   Glucose, UA (A) NEGATIVE mg/dL    TEST NOT REPORTED DUE TO COLOR INTERFERENCE OF URINE PIGMENT   Hgb urine dipstick LARGE (A) NEGATIVE   Bilirubin Urine (A) NEGATIVE    TEST NOT REPORTED DUE TO COLOR INTERFERENCE OF URINE PIGMENT   Ketones, ur (A) NEGATIVE mg/dL    TEST NOT REPORTED DUE TO COLOR INTERFERENCE OF URINE PIGMENT   Protein, ur (A) NEGATIVE mg/dL    TEST NOT REPORTED DUE TO COLOR INTERFERENCE OF URINE PIGMENT   Nitrite (A) NEGATIVE    TEST NOT REPORTED DUE TO COLOR INTERFERENCE OF URINE PIGMENT   Leukocytes,Ua (A) NEGATIVE    TEST NOT REPORTED DUE TO COLOR  INTERFERENCE OF URINE PIGMENT    Comment: Performed at [facility], [address], [place], [place] [address]  Urinalysis, Microscopic (reflex)     Status: Abnormal   Collection Time: 09/27/22  3:28 PM  Result Value Ref Range   RBC / HPF >50 0 - 5 RBC/hpf   WBC, UA >50 0 - 5 WBC/hpf   Bacteria, UA MANY (A) NONE SEEN   Squamous Epithelial / HPF 0-5 0 - 5 /HPF   WBC Clumps PRESENT     Comment: Performed at [facility], [address] [place], [place] [address]  I-stat chem 8, ED (not at [facility], [facility] or [facility])     Status: Abnormal   Collection Time: 09/27/22  4:15 PM  Result Value Ref Range   Sodium 125 (L) 135 - 145 mmol/L   Potassium 4.1 3.5 - 5.1 mmol/L   Chloride 92 (L) 98 - 111 mmol/L   BUN 17 8 - 23 mg/dL   Creatinine, Ser 0.98 (L) 0.61 - 1.24 mg/dL   Glucose, Bld 119 (H) 70 - 99 mg/dL    Comment: Glucose reference range applies only to samples taken after fasting for at least 8 hours.   Calcium, Ion 1.12 (L) 1.15 - 1.40 mmol/L   TCO2 24 22 - 32 mmol/L   Hemoglobin 10.9 (L) 13.0 - 17.0 g/dL   HCT 14.7 (L) 82.9 - 56.2 %  Basic metabolic panel     Status: Abnormal   Collection Time: 09/27/22  6:17 PM  Result Value Ref Range   Sodium 126 (L) 135 - 145 mmol/L   Potassium 3.7 3.5 - 5.1 mmol/L   Chloride 92 (L) 98 - 111 mmol/L   CO2 22 22 - 32 mmol/L   Glucose, Bld 153 (H) 70 - 99 mg/dL    Comment: Glucose reference range applies only to samples taken after fasting for at least 8 hours.   BUN 18 8 - 23 mg/dL   Creatinine, Ser 1.30 0.61 - 1.24 mg/dL   Calcium 8.9 8.9 - 86.5 mg/dL   GFR, Estimated >78 >46 mL/min    Comment: (NOTE) Calculated using the CKD-EPI Creatinine Equation (2021)    Anion gap 12 5 - 15    Comment: Performed at [facility], [address], [place], [place] [address]  CBC     Status: Abnormal   Collection Time: 09/27/22  6:17 PM  Result Value Ref Range   WBC 17.2 (H) 4.0 - 10.5 K/uL   RBC 3.58 (L)  4.22 - 5.81 MIL/uL   Hemoglobin 10.8 (L) 13.0 - 17.0 g/dL   HCT 28.4 (L) 13.2 - 44.0 %   MCV 84.9  80.0 - 100.0 fL   MCH 30.2 26.0 - 34.0 pg   MCHC 35.5 30.0 - 36.0 g/dL   RDW 38.7 56.4 - 33.2 %   Platelets 376 150 - 400 K/uL   nRBC 0.0 0.0 - 0.2 %    Comment: Performed at [facility], [address] [place], [place] [address]  Hemoglobin and hematocrit, blood     Status: Abnormal   Collection Time: 09/27/22  8:35 PM  Result Value Ref Range   Hemoglobin 9.7 (L) 13.0 - 17.0 g/dL   HCT 41.6 (L) 60.6 - 30.1 %    Comment: Performed at [facility], [address] [place], [place] [address]  CBG monitoring, ED     Status: Abnormal   Collection Time: 09/27/22 11:17 PM  Result Value Ref Range   Glucose-Capillary 127 (H) 70 - 99 mg/dL    Comment: Glucose reference range applies only to samples taken after fasting for at least 8 hours.   CT PELVIS WO CONTRAST  Result Date: 09/27/2022 CLINICAL DATA:  Evaluating for bladder clots post urologic procedure. Bloody drainage at Foley insertion site. EXAM: CT PELVIS WITHOUT CONTRAST TECHNIQUE: Multidetector CT imaging of the pelvis was performed following the standard protocol without intravenous contrast. RADIATION DOSE REDUCTION: This exam was performed according to the departmental dose-optimization program which includes automated exposure control, adjustment of the mA and/or kV according to patient size and/or use of iterative reconstruction technique. COMPARISON:  08/25/2022 FINDINGS: Urinary Tract: Foley catheter with inflated balloon in the bladder. The bladder is decompressed. Decompression of the bladder limits evaluation but no discrete abnormality is identified. Bowel: Visualized large and small bowel are not abnormally distended. No inflammatory changes are appreciated. The appendix is normal. Vascular/Lymphatic: Calcification of the iliac arteries. No aneurysm. No significant lymphadenopathy. Reproductive:   Seed implants in the prostate gland. Other: No free air or free fluid in the pelvis. Postoperative changes in the left groin. Musculoskeletal: Degenerative changes in the spine. No focal bone lesions. IMPRESSION: 1. Bladder is contracted around a Foley catheter, limiting evaluation. No discrete abnormality is identified as visualized. If there is clinical concern for residual bladder lesion or hematoma, consider repeat imaging with clamping of the Foley catheter. 2. Prostate seed implants. 3. Atherosclerotic changes in the iliac arteries. Electronically Signed   By: Burman Nieves M.D.   On: 09/27/2022 19:49    Pending Labs Unresulted Labs (From admission, onward)     Start     Ordered   09/28/22 0800  Basic metabolic panel  2 times daily,   R      09/27/22 2018   09/28/22 0500  CBC  Tomorrow morning,   R        09/27/22 1952   09/28/22 0500  Basic metabolic panel  Tomorrow morning,   R        09/27/22 1952   09/28/22 0500  Magnesium  Tomorrow morning,   R        09/27/22 1952   09/28/22 0500  Phosphorus  Tomorrow morning,   R        09/27/22 1952   09/28/22 0500  Hemoglobin A1c  Tomorrow morning,   R       Comments: To assess prior glycemic control    09/27/22 2020   09/27/22 1951  Hemoglobin and hematocrit, blood  2 times daily,   R      09/27/22 1950   09/27/22 1950  Culture, blood (Routine X 2)  w Reflex to ID Panel  BLOOD CULTURE X 2,   R      09/27/22 1950   09/27/22 1919  Urine Culture  Add-on,   AD       Question:  Indication  Answer:  Dysuria   09/27/22 1918            Vitals/Pain Today's Vitals   09/28/22 0330 09/28/22 0512 09/28/22 0530 09/28/22 0531  BP: (!) 111/48  (!) 125/58   Pulse: 73  74   Resp: 11  12   Temp:  98.1 F (36.7 C)    TempSrc:  Oral    SpO2: 95%  93%   Weight:      Height:      PainSc:    0-No pain    Isolation Precautions No active isolations  Medications Medications  sodium chloride irrigation 0.9 % 3,000 mL (0 mLs Irrigation  Stopped 09/28/22 0519)  tamsulosin (FLOMAX) capsule 0.4 mg (0.4 mg Oral Given 09/27/22 2124)  cyanocobalamin (VITAMIN B12) tablet 1,000 mcg (has no administration in time range)  acetaminophen (TYLENOL) tablet 650 mg (has no administration in time range)  prochlorperazine (COMPAZINE) injection 5 mg (5 mg Intravenous Given 09/27/22 2117)  melatonin tablet 5 mg (has no administration in time range)  oxyCODONE (Oxy IR/ROXICODONE) immediate release tablet 5 mg (has no administration in time range)  polyethylene glycol (MIRALAX / GLYCOLAX) packet 17 g (has no administration in time range)  HYDROmorphone (DILAUDID) injection 0.5 mg (has no administration in time range)  0.9 %  sodium chloride infusion ( Intravenous Rate/Dose Verify 09/28/22 0523)  cefTRIAXone (ROCEPHIN) 2 g in sodium chloride 0.9 % 100 mL IVPB (has no administration in time range)  oxyCODONE-acetaminophen (PERCOCET/ROXICET) 5-325 MG per tablet 1 tablet (1 tablet Oral Given 09/27/22 1603)  HYDROmorphone (DILAUDID) injection 1 mg (1 mg Intravenous Given 09/27/22 1732)  sodium chloride 0.9 % bolus 1,000 mL (0 mLs Intravenous Stopped 09/27/22 2040)  cefTRIAXone (ROCEPHIN) 1 g in sodium chloride 0.9 % 100 mL IVPB (0 g Intravenous Stopped 09/27/22 2015)    Mobility walks with person assist

## 2022-09-28 NOTE — Progress Notes (Signed)
Urology Inpatient Progress Report  Hyponatremia [E87.1] Gross hematuria [R31.0] Hematuria, unspecified type [R31.9]        Intv/Subj: No difficulty with foley overnight. CBI at very slow drip this AM. No pain.   Principal Problem:   Gross hematuria Active Problems:   Complicated UTI (urinary tract infection)   Hyponatremia   Pressure injury of skin   Hyperglycemia   Bladder tumor  Current Facility-Administered Medications  Medication Dose Route Frequency Provider Last Rate Last Admin   0.9 %  sodium chloride infusion   Intravenous Continuous Darlin Drop, DO 75 mL/hr at 09/28/22 8119 New Bag at 09/28/22 1478   acetaminophen (TYLENOL) tablet 650 mg  650 mg Oral Q6H PRN Dow Adolph N, DO       cefTRIAXone (ROCEPHIN) 2 g in sodium chloride 0.9 % 100 mL IVPB  2 g Intravenous Q24H Hall, Carole N, DO       Chlorhexidine Gluconate Cloth 2 % PADS 6 each  6 each Topical Daily Carlesha Seiple, Roselee Nova D, MD       cyanocobalamin (VITAMIN B12) tablet 1,000 mcg  1,000 mcg Oral Daily Hall, Carole N, DO       HYDROmorphone (DILAUDID) injection 0.5 mg  0.5 mg Intravenous Q4H PRN Hall, Carole N, DO       melatonin tablet 5 mg  5 mg Oral QHS PRN Dow Adolph N, DO       oxyCODONE (Oxy IR/ROXICODONE) immediate release tablet 5 mg  5 mg Oral Q6H PRN Dow Adolph N, DO       polyethylene glycol (MIRALAX / GLYCOLAX) packet 17 g  17 g Oral Daily PRN Dow Adolph N, DO       prochlorperazine (COMPAZINE) injection 5 mg  5 mg Intravenous Q6H PRN Dow Adolph N, DO   5 mg at 09/27/22 2117   sodium chloride irrigation 0.9 % 3,000 mL  3,000 mL Irrigation Continuous Terald Sleeper, MD 0 mL/hr at 09/28/22 0519 Restarted at 09/28/22 0627   tamsulosin (FLOMAX) capsule 0.4 mg  0.4 mg Oral QHS Dow Adolph N, DO   0.4 mg at 09/27/22 2124     Objective: Vital: Vitals:   09/28/22 0330 09/28/22 0512 09/28/22 0530 09/28/22 0622  BP: (!) 111/48  (!) 125/58 (!) 120/46  Pulse: 73  74 80  Resp: 11  12 20   Temp:   98.1 F (36.7 C)  97.8 F (36.6 C)  TempSrc:  Oral  Oral  SpO2: 95%  93% 100%  Weight:    58.9 kg  Height:       I/Os: I/O last 3 completed shifts: In: 2053.4 [I.V.:703.4; Other:250; IV Piggyback:1100] Out: 29562 [Urine:11800]  Physical Exam:  General: Patient is in no apparent distress Lungs: Normal respiratory effort, chest expands symmetrically. GI: The abdomen is soft and nontender Foley: 3 way foley draining clear yellow urine on very slow drip, no clots  Ext: lower extremities symmetric  Lab Results: Recent Labs    09/27/22 1817 09/27/22 2035 09/28/22 0533  WBC 17.2*  --  11.8*  HGB 10.8* 9.7* 9.6*  HCT 30.4* 28.0* 27.3*   Recent Labs    09/27/22 1615 09/27/22 1817 09/28/22 0533  NA 125* 126* 128*  K 4.1 3.7 3.4*  CL 92* 92* 97*  CO2  --  22 24  GLUCOSE 150* 153* 91  BUN 17 18 14   CREATININE 0.60* 0.77 0.54*  CALCIUM  --  8.9 8.3*   No results for input(s): "LABPT", "INR" in the last  72 hours. No results for input(s): "LABURIN" in the last 72 hours. Results for orders placed or performed during the hospital encounter of 08/24/22  Urine Culture     Status: None   Collection Time: 08/24/22  6:37 PM   Specimen: Urine, Clean Catch  Result Value Ref Range Status   Specimen Description   Final    URINE, CLEAN CATCH Performed at [facility], [address], [place], [place] [address]    Special Requests   Final    NONE Performed at [facility], [address] [place], [place] [address]    Culture   Final    NO GROWTH Performed at [facility], [address], [place], [place] [address]    Report Status 08/25/2022 FINAL  Final    Studies/Results: CT PELVIS WO CONTRAST  Result Date: 09/27/2022 CLINICAL DATA:  Evaluating for bladder clots post urologic procedure. Bloody drainage at Foley insertion site. EXAM: CT PELVIS WITHOUT CONTRAST TECHNIQUE: Multidetector CT imaging of the pelvis was performed  following the standard protocol without intravenous contrast. RADIATION DOSE REDUCTION: This exam was performed according to the departmental dose-optimization program which includes automated exposure control, adjustment of the mA and/or kV according to patient size and/or use of iterative reconstruction technique. COMPARISON:  08/25/2022 FINDINGS: Urinary Tract: Foley catheter with inflated balloon in the bladder. The bladder is decompressed. Decompression of the bladder limits evaluation but no discrete abnormality is identified. Bowel: Visualized large and small bowel are not abnormally distended. No inflammatory changes are appreciated. The appendix is normal. Vascular/Lymphatic: Calcification of the iliac arteries. No aneurysm. No significant lymphadenopathy. Reproductive:  Seed implants in the prostate gland. Other: No free air or free fluid in the pelvis. Postoperative changes in the left groin. Musculoskeletal: Degenerative changes in the spine. No focal bone lesions. IMPRESSION: 1. Bladder is contracted around a Foley catheter, limiting evaluation. No discrete abnormality is identified as visualized. If there is clinical concern for residual bladder lesion or hematoma, consider repeat imaging with clamping of the Foley catheter. 2. Prostate seed implants. 3. Atherosclerotic changes in the iliac arteries. Electronically Signed   By: Burman Nieves M.D.   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   Plan: Gross hematuria -Hgb has dropped but is stable from yesterday -Urine is clear with CBI clamped, will shoot for void trial later today -keep overnight and possible dc tomorrow if voids today -if persistent hematuria, make NPO at MN and possible intervention tomorrow   Kasandra Knudsen, MD Urology 09/28/2022, 8:47 AM

## 2022-09-28 NOTE — Progress Notes (Signed)
Per Urology Foley Cathter removed at 12:05 pm. At this time Patient voided in urinal 100 cc bloody urine and clots noted.

## 2022-09-28 NOTE — TOC Initial Note (Addendum)
Transition of Care [facility]) - Initial/Assessment Note    Patient Details  Name: Jeffrey Porter MRN: [id number] Date of Birth: 09/06/1931  [facility] CM/SW Contact:    Lanier Clam, RN Phone Number: 09/28/2022, 10:15 AM  Clinical Narrative:From [place] spoke to [facility] rep [phone], fax#[phone](fax fl2) await PT recc prior faxing fl2. [facility] for [facility]PT if needed.Per dtr Lura-patient uses w/c mostly, RW used in Bathroom, wears eyeglasses,has partials top, Noted A+0x3. Currently has f/c(may be removed prior d/c).  PTAR @ d/c.   -4p-Per patient & Lura(dtr) permission agreed to fax out. Await bed offers.Prefers [facility] for [facility] SNF.Marland Kitchen             Expected Discharge Plan: Assisted Living Barriers to Discharge: Continued Medical Work up   Patient Goals and CMS Choice Patient states their goals for this hospitalization and ongoing recovery are:: Return back to [facility] CMS Medicare.gov Compare Post Acute Care list provided to:: Patient Represenative (must comment) ([place](dtr)) Choice offered to / list presented to : Adult Children [place] ownership interest in [facility].provided to:: Adult Children    Expected Discharge Plan and Services   Discharge Planning Services: CM Consult Post Acute Care Choice: Resumption of Svcs/PTA Provider (return ALF possible HHPT facility uses [facility] if needed.) Living arrangements for the past 2 months: Assisted Living Facility                                      Prior Living Arrangements/Services Living arrangements for the past 2 months: Assisted Living Facility Lives with:: Facility Resident Patient language and need for interpreter reviewed:: Yes Do you feel safe going back to the place where you live?: Yes      Need for Family Participation in Patient Care: Yes (Comment) Care giver support system in place?: Yes (comment) Current home services: DME (w/c,rw) Criminal Activity/Legal Involvement  Pertinent to Current Situation/Hospitalization: No - Comment as needed  Activities of Daily Living      Permission Sought/Granted Permission sought to share information with : Case Manager Permission granted to share information with : Yes, Verbal Permission Granted  Share Information with NAME: Case manager           Emotional Assessment Appearance:: Appears stated age Attitude/Demeanor/Rapport: Gracious Affect (typically observed): Accepting   Alcohol / Substance Use: Not Applicable Psych Involvement: No (comment)  Admission diagnosis:  Hyponatremia [E87.1] Gross hematuria [R31.0] Hematuria, unspecified type [R31.9] Patient Active Problem List   Diagnosis Date Noted   Pressure injury of skin 09/28/2022   Hyperglycemia 09/28/2022   Bladder tumor 09/28/2022   Gross hematuria 09/23/2022   CAP (community acquired pneumonia) 06/20/2021   Debility 06/20/2021   Pneumonia 06/20/2021   Hypokalemia 06/19/2021   Hypocalcemia 06/19/2021   Elevated LFTs 06/19/2021   SIRS (systemic inflammatory response syndrome) (HCC) 06/18/2021   Essential hypertension 12/22/2017   History of prostate cancer 12/22/2017   Glaucoma 12/22/2017   Aortic stenosis 12/22/2017   OA (osteoarthritis) 12/22/2017   Abnormal LFTs 12/22/2017   Thrombocytosis 12/22/2017   Normocytic anemia 12/22/2017   Hyponatremia 12/22/2017   Cellulitis and abscess of left leg 12/21/2017   Left ankle swelling    Acute respiratory failure with hypoxia (HCC)    Paroxysmal supraventricular tachycardia    Sepsis (HCC) 12/06/2017   Complicated UTI (urinary tract infection) 12/06/2017   Tachycardia 12/06/2017   Leukocytosis 12/06/2017  PCP:  Sigmund Hazel, MD Pharmacy:   [facility] [address] [place] [facility] [address] [place] [address] Phone: [phone] Fax: [phone]     Social Determinants of Health (SDOH) Social History: SDOH Screenings   Tobacco Use: Medium Risk  (09/27/2022)   SDOH Interventions:     Readmission Risk Interventions     No data to display

## 2022-09-29 ENCOUNTER — Inpatient Hospital Stay (HOSPITAL_COMMUNITY): Payer: Medicare HMO | Admitting: Certified Registered Nurse Anesthetist

## 2022-09-29 ENCOUNTER — Encounter (HOSPITAL_COMMUNITY)
Admission: EM | Disposition: A | Discharge status: Home / Self Care | Payer: Self-pay | Source: Home / Self Care | Attending: Urology

## 2022-09-29 DIAGNOSIS — R310 Gross hematuria: Secondary | ICD-10-CM

## 2022-09-29 DIAGNOSIS — N390 Urinary tract infection, site not specified: Secondary | ICD-10-CM | POA: Diagnosis not present

## 2022-09-29 DIAGNOSIS — I350 Nonrheumatic aortic (valve) stenosis: Secondary | ICD-10-CM | POA: Diagnosis not present

## 2022-09-29 DIAGNOSIS — R338 Other retention of urine: Secondary | ICD-10-CM | POA: Diagnosis not present

## 2022-09-29 DIAGNOSIS — Z87891 Personal history of nicotine dependence: Secondary | ICD-10-CM | POA: Diagnosis not present

## 2022-09-29 DIAGNOSIS — I10 Essential (primary) hypertension: Secondary | ICD-10-CM

## 2022-09-29 HISTORY — PX: CYSTOSCOPY WITH FULGERATION: SHX6638

## 2022-09-29 LAB — HEMOGLOBIN A1C
Hgb A1c MFr Bld: 5.4 % (ref 4.8–5.6)
Mean Plasma Glucose: 108 mg/dL

## 2022-09-29 LAB — BASIC METABOLIC PANEL
Anion gap: 10 (ref 5–15)
BUN: 11 mg/dL (ref 8–23)
CO2: 24 mmol/L (ref 22–32)
Calcium: 8.1 mg/dL — ABNORMAL LOW (ref 8.9–10.3)
Chloride: 100 mmol/L (ref 98–111)
Creatinine, Ser: 0.73 mg/dL (ref 0.61–1.24)
GFR, Estimated: >60 mL/min (ref >60)
Glucose, Bld: 119 mg/dL — ABNORMAL HIGH (ref 70–99)
Potassium: 3.7 mmol/L (ref 3.5–5.1)
Sodium: 134 mmol/L — ABNORMAL LOW (ref 135–145)

## 2022-09-29 LAB — HEMOGLOBIN AND HEMATOCRIT, BLOOD
HCT: 25.4 % — ABNORMAL LOW (ref 39.0–52.0)
Hemoglobin: 8.5 g/dL — ABNORMAL LOW (ref 13.0–17.0)

## 2022-09-29 LAB — CULTURE, BLOOD (ROUTINE X 2): Culture: NO GROWTH

## 2022-09-29 LAB — GLUCOSE, CAPILLARY
Glucose-Capillary: 111 mg/dL — ABNORMAL HIGH (ref 70–99)
Glucose-Capillary: 118 mg/dL — ABNORMAL HIGH (ref 70–99)
Glucose-Capillary: 129 mg/dL — ABNORMAL HIGH (ref 70–99)

## 2022-09-29 SURGERY — CYSTOSCOPY, WITH BLADDER FULGURATION
Anesthesia: General | Laterality: Right

## 2022-09-29 MED ORDER — FENTANYL CITRATE (PF) 100 MCG/2ML IJ SOLN
INTRAMUSCULAR | Status: DC | PRN
  Administered 2022-09-29: 25 ug via INTRAVENOUS
  Administered 2022-09-29: 50 ug via INTRAVENOUS
  Administered 2022-09-29: 25 ug via INTRAVENOUS

## 2022-09-29 MED ORDER — LACTATED RINGERS IV SOLN: INTRAVENOUS | Status: DC | PRN

## 2022-09-29 MED ORDER — PROMETHAZINE HCL 25 MG/ML IJ SOLN: 6.2500 mg | INTRAMUSCULAR | Status: DC | PRN

## 2022-09-29 MED ORDER — NIACIN 100 MG PO TABS
100.0000 mg | ORAL_TABLET | Freq: Every day | ORAL | Status: DC
  Administered 2022-10-01 – 2022-10-05 (×5): 100 mg via ORAL
  Filled 2022-09-29 (×6): qty 1

## 2022-09-29 MED ORDER — MAGNESIUM OXIDE -MG SUPPLEMENT 400 (240 MG) MG PO TABS
400.0000 mg | ORAL_TABLET | Freq: Every day | ORAL | Status: DC
  Administered 2022-09-29 – 2022-10-02 (×2): 400 mg via ORAL
  Filled 2022-09-29 (×5): qty 1

## 2022-09-29 MED ORDER — ADULT MULTIVITAMIN W/MINERALS CH
1.0000 | ORAL_TABLET | Freq: Every day | ORAL | Status: DC
  Administered 2022-09-30 – 2022-10-04 (×2): 1 via ORAL
  Filled 2022-09-29 (×4): qty 1

## 2022-09-29 MED ORDER — MIDAZOLAM HCL 2 MG/2ML IJ SOLN: 0.5000 mg | Freq: Once | INTRAMUSCULAR | Status: DC | PRN

## 2022-09-29 MED ORDER — 0.9 % SODIUM CHLORIDE (POUR BTL) OPTIME
TOPICAL | Status: DC | PRN
  Administered 2022-09-29: 1000 mL

## 2022-09-29 MED ORDER — ACETAMINOPHEN 500 MG PO TABS
1000.0000 mg | ORAL_TABLET | Freq: Once | ORAL | Status: AC
  Administered 2022-09-29: 1000 mg via ORAL
  Filled 2022-09-29: qty 2

## 2022-09-29 MED ORDER — PHENYLEPHRINE 80 MCG/ML (10ML) SYRINGE FOR IV PUSH (FOR BLOOD PRESSURE SUPPORT)
PREFILLED_SYRINGE | INTRAVENOUS | Status: AC
  Filled 2022-09-29: qty 20

## 2022-09-29 MED ORDER — ONDANSETRON HCL 4 MG/2ML IJ SOLN
INTRAMUSCULAR | Status: DC | PRN
  Administered 2022-09-29: 4 mg via INTRAVENOUS

## 2022-09-29 MED ORDER — ACETAMINOPHEN 500 MG PO TABS: 500.0000 mg | ORAL_TABLET | Freq: Every day | ORAL | Status: DC | PRN

## 2022-09-29 MED ORDER — ONDANSETRON HCL 4 MG/2ML IJ SOLN
INTRAMUSCULAR | Status: AC
  Filled 2022-09-29: qty 2

## 2022-09-29 MED ORDER — FENTANYL CITRATE (PF) 100 MCG/2ML IJ SOLN
INTRAMUSCULAR | Status: AC
  Filled 2022-09-29: qty 2

## 2022-09-29 MED ORDER — ZINC SULFATE 220 (50 ZN) MG PO CAPS
220.0000 mg | ORAL_CAPSULE | Freq: Every day | ORAL | Status: DC
  Administered 2022-09-29: 220 mg via ORAL
  Filled 2022-09-29 (×5): qty 1

## 2022-09-29 MED ORDER — DEXAMETHASONE SODIUM PHOSPHATE 4 MG/ML IJ SOLN
INTRAMUSCULAR | Status: DC | PRN
  Administered 2022-09-29: 4 mg via INTRAVENOUS

## 2022-09-29 MED ORDER — OXYCODONE HCL 5 MG PO TABS: 5.0000 mg | ORAL_TABLET | Freq: Once | ORAL | Status: DC | PRN

## 2022-09-29 MED ORDER — PROPOFOL 10 MG/ML IV BOLUS
INTRAVENOUS | Status: DC | PRN
  Administered 2022-09-29: 80 mg via INTRAVENOUS

## 2022-09-29 MED ORDER — HYDROMORPHONE HCL 1 MG/ML IJ SOLN
0.2500 mg | INTRAMUSCULAR | Status: DC | PRN
  Administered 2022-09-29 (×2): 0.25 mg via INTRAVENOUS

## 2022-09-29 MED ORDER — PHENYLEPHRINE 80 MCG/ML (10ML) SYRINGE FOR IV PUSH (FOR BLOOD PRESSURE SUPPORT)
PREFILLED_SYRINGE | INTRAVENOUS | Status: AC
  Filled 2022-09-29: qty 10

## 2022-09-29 MED ORDER — PHENYLEPHRINE 80 MCG/ML (10ML) SYRINGE FOR IV PUSH (FOR BLOOD PRESSURE SUPPORT)
PREFILLED_SYRINGE | INTRAVENOUS | Status: DC | PRN
  Administered 2022-09-29 (×2): 120 ug via INTRAVENOUS
  Administered 2022-09-29: 160 ug via INTRAVENOUS
  Administered 2022-09-29 (×2): 80 ug via INTRAVENOUS

## 2022-09-29 MED ORDER — OXYCODONE HCL 5 MG/5ML PO SOLN: 5.0000 mg | Freq: Once | ORAL | Status: DC | PRN

## 2022-09-29 MED ORDER — LATANOPROST 0.005 % OP SOLN
1.0000 [drp] | Freq: Every day | OPHTHALMIC | Status: DC
  Administered 2022-09-29 – 2022-10-04 (×6): 1 [drp] via OPHTHALMIC
  Filled 2022-09-29: qty 2.5

## 2022-09-29 MED ORDER — STERILE WATER FOR IRRIGATION IR SOLN
Status: DC | PRN
  Administered 2022-09-29: 500 mL

## 2022-09-29 MED ORDER — HYDROMORPHONE HCL 1 MG/ML IJ SOLN
INTRAMUSCULAR | Status: AC
  Administered 2022-09-29: 0.5 mg via INTRAVENOUS
  Filled 2022-09-29: qty 1

## 2022-09-29 MED ORDER — LIDOCAINE 2% (20 MG/ML) 5 ML SYRINGE
INTRAMUSCULAR | Status: DC | PRN
  Administered 2022-09-29: 20 mg via INTRAVENOUS

## 2022-09-29 MED ORDER — VITAMIN D 25 MCG (1000 UNIT) PO TABS
2000.0000 [IU] | ORAL_TABLET | Freq: Every day | ORAL | Status: DC
  Administered 2022-09-29 – 2022-10-05 (×5): 2000 [IU] via ORAL
  Filled 2022-09-29 (×6): qty 2

## 2022-09-29 MED ORDER — LIDOCAINE HCL (PF) 2 % IJ SOLN
INTRAMUSCULAR | Status: AC
  Filled 2022-09-29: qty 5

## 2022-09-29 MED ORDER — SODIUM CHLORIDE 0.9 % IR SOLN
Status: DC | PRN
  Administered 2022-09-29: 12000 mL via INTRAVESICAL

## 2022-09-29 SURGICAL SUPPLY — 22 items
BAG URINE DRAIN 2000ML AR STRL (UROLOGICAL SUPPLIES) IMPLANT
BAG URO CATCHER STRL LF (MISCELLANEOUS) ×1 IMPLANT
CATH HEMA 3WAY 30CC 24FR COUDE (CATHETERS) IMPLANT
CATH URETL OPEN END 6FR 70 (CATHETERS) IMPLANT
CLOTH BEACON ORANGE TIMEOUT ST (SAFETY) ×1 IMPLANT
ELECT COAG BIPOLAR CYL 1.2MMM (ELECTROSURGICAL)
ELECT REM PT RETURN 15FT ADLT (MISCELLANEOUS) ×1 IMPLANT
ELECTRODE COAG BIPLR CYL 1.2MM (ELECTROSURGICAL) IMPLANT
GLOVE BIO SURGEON STRL SZ 6.5 (GLOVE) ×1 IMPLANT
GOWN STRL REUS W/ TWL LRG LVL3 (GOWN DISPOSABLE) ×1 IMPLANT
GOWN STRL REUS W/TWL LRG LVL3 (GOWN DISPOSABLE) ×1
GUIDEWIRE STR DUAL SENSOR (WIRE) IMPLANT
HOLDER FOLEY CATH W/STRAP (MISCELLANEOUS) IMPLANT
KIT TURNOVER KIT A (KITS) IMPLANT
LOOP CUT BIPOLAR 24F LRG (ELECTROSURGICAL) IMPLANT
MANIFOLD NEPTUNE II (INSTRUMENTS) ×1 IMPLANT
PACK CYSTO (CUSTOM PROCEDURE TRAY) ×1 IMPLANT
STENT URET 6FRX22 CONTOUR (STENTS) IMPLANT
SYR 30ML LL (SYRINGE) IMPLANT
SYR TOOMEY IRRIG 70ML (MISCELLANEOUS) ×1
SYRINGE TOOMEY IRRIG 70ML (MISCELLANEOUS) IMPLANT
TUBING CONNECTING 10 (TUBING) ×1 IMPLANT

## 2022-09-29 NOTE — Transfer of Care (Signed)
Immediate Anesthesia Transfer of Care Note  Patient: Jeffrey Porter  Procedure(s) Performed: CYSTOSCOPY WITH FULGARATION AND CLOT EVACUATION WITH STENT PLACEMENT (Right)  Patient Location: PACU  Anesthesia Type:General  Level of Consciousness: awake and patient cooperative  Airway & Oxygen Therapy: Patient Spontanous Breathing and Patient connected to face mask  Post-op Assessment: Report given to RN and Post -op Vital signs reviewed and stable  Post vital signs: Reviewed and stable  Last Vitals:  Vitals Value Taken Time  BP    Temp    Pulse 89 09/29/22 1335  Resp 13 09/29/22 1335  SpO2 100 % 09/29/22 1335  Vitals shown include unvalidated device data.  Last Pain:  Vitals:   09/29/22 0609  TempSrc: Oral  PainSc:       Patients Stated Pain Goal: 0 (09/29/22 0322)  Complications: No notable events documented.

## 2022-09-29 NOTE — Progress Notes (Signed)
This nurse inserted the 3 way catheter using sterile technique as ordered. Hand irrigation done and noted large amount of blood clots. CBI started again per doctors order. Unk Lightning NP updated on this patient.

## 2022-09-29 NOTE — Anesthesia Preprocedure Evaluation (Addendum)
Anesthesia Evaluation  Patient identified by MRN, date of birth, ID band Patient awake    Reviewed: Allergy & Precautions, NPO status , Patient's Chart, lab work & pertinent test results  History of Anesthesia Complications Negative for: history of anesthetic complications  Airway Mallampati: II  TM Distance: >3 FB Neck ROM: Full    Dental  (+) Dental Advisory Given   Pulmonary former smoker   breath sounds clear to auscultation       Cardiovascular hypertension, + Valvular Problems/Murmurs AS  Rhythm:Regular Rate:Normal + Systolic murmurs '19 ECHO: moderate concentric hypertrophy. Systolic function was vigorous. The   EF 65% to 70%. Wall motion was normal; there were no regional wall motion   abnormalities. There was an increased relative contribution of atrial contraction to ventricular filling. grade 1 diastolic dysfunction. Doppler parameters are consistent with   high ventricular filling pressure.  - Aortic valve: Valve mobility was restricted. There was moderate to severe stenosis. There was mild to moderate regurgitation. Valve area (VTI): 0.91 cm^2. Valve area (Vmax): 0.82 cm^2. Valve area (Vmean): 0.78 cm^2.  - Mitral valve: Severely calcified annulus. Mild thickening and calcification. There was trivial regurgitation.  - Tricuspid valve: There was mild regurgitation.     Neuro/Psych Glaucoma Parkinson's    GI/Hepatic negative GI ROS, Neg liver ROS,,,  Endo/Other  negative endocrine ROS    Renal/GU negative Renal ROS     Musculoskeletal  (+) Arthritis ,    Abdominal   Peds  Hematology  (+) Blood dyscrasia (Hb 8.5, plt 352k), anemia   Anesthesia Other Findings Prostate cancer  Reproductive/Obstetrics                             Anesthesia Physical Anesthesia Plan  ASA: 4  Anesthesia Plan: General   Post-op Pain Management: Tylenol PO (pre-op)*   Induction:  Intravenous  PONV Risk Score and Plan: 2 and Ondansetron and Dexamethasone  Airway Management Planned: LMA  Additional Equipment: ClearSight  Intra-op Plan:   Post-operative Plan:   Informed Consent: I have reviewed the patients History and Physical, chart, labs and discussed the procedure including the risks, benefits and alternatives for the proposed anesthesia with the patient or authorized representative who has indicated his/her understanding and acceptance.     Dental advisory given  Plan Discussed with: CRNA and Surgeon  Anesthesia Plan Comments:        Anesthesia Quick Evaluation

## 2022-09-29 NOTE — Op Note (Addendum)
Operative Note  Preoperative diagnosis:  1.  Gross hematuria with clot retention 2.  Obstructing blood clots    Postoperative diagnosis: 1.  Gross hematuria with clot retention 2.  Obstructing blood clots  Procedure(s): 1.  Cytoscopy with clot evacuation 2.  Right ureteral stent placement with tether - 6Fr x 22cm  3.  Fulguration of prior resection site  Surgeon: Kasandra Knudsen, MD  Assistants:  None  Anesthesia:  General  Complications:  None  EBL:  50 mL  Specimens: 1. none  Drains/Catheters: 1.  24Fr 3-way hematuria foley on continuous bladder irrigation 2.  6 French by 22 cm right ureteral stent with tether  Intraoperative findings:   Normal anterior urethra Evidence of urethral balloon dilation Bilateral Uos seen at start and end of case Prior TUR resection bed with necrotic exudate, no active bladder seen.  Cautery of edges was performed with bipolar loop.  Right ureteral stent placed due to cautery of lateral UO edge  Indication:  Jeffrey Porter is a 89 y.o. male with bladder cancer who presented to ED multiple times with   Description of procedure: After risks and benefits of the procedure discussed with the patient, informed consent was obtained.  The patient is taken to the operating placed in the supine position.  Anesthesia was induced and antibiotics were administered.  The patient was then repositioned in the dorsolithotomy position.  The indwelling Foley catheter was removed.  He was prepped and draped in the usual sterile fashion and hat was performed.  The resectoscope using the visual obturator was then placed in the with meatus and advanced into the bladder under direct visualization.  Findings noted above.  There is significant clot burden and the Toomey syringe was used to evacuate the clot burden through the resectoscope.  After the clots were removed further inspection of the bladder did not reveal any active bleeding.  The prior resection bed of the  bladder tumor appeared to have sloughing necrotic tissue.  There is no active bleeding seen from that area.  Systematic inspection of the bladder did not show any other areas of bleeding.  Evaluation of his urethra did not show any other areas of bleeding as well.  The strictured area was wide open following the urethral dilation done at the last surgery date.  The bipolar loop was assembled with the working element and used to freshen up the resection bed site and try and remove some of the sloughing necrotic tissue and then cauterized the edges.  This was very close to the right ureteral orifice.  The decision was made to cannulate the right ureteral orifice with a sensor wire.  It was noted to be a little tight and so a 6 [place] by 22 cm double-J stent with tether was then placed.  The tether was left on the stent.  The bladder was then evaluated with the irrigant turned off.  No active bleeding was seen.  The cystoscope was removed.  A 24 French three-way Foley catheter was then placed and inflated with 30 cc of sterile water.  The tether of the stent was secured to the Foley catheter.  Continuous bladder irrigation was initiated.  The patient emerged from anesthesia and transferred the PACU in stable condition.  Plan:  transfer back to floor with CBI

## 2022-09-29 NOTE — Progress Notes (Signed)
Progress Note    Jeffrey Porter   WUJ:[id number]  DOB: Sep 04, 1931  DOA: 09/27/2022     2 PCP: Sigmund Hazel, MD  Initial CC: hematuria  Hospital Course: Jeffrey Porter is an 89 y.o. male past medical history significant for urethral stricture urethral calculi bladder tumor status post cystoscopy with laser treated.  See final dilation of urethra secondary to stricture and transurethral resection of bladder tumor on 09/14/2022 discharged home with a Foley catheter since then has had gross hematuria suprapubic pain urology was consulted and saw the patient irrigated the patient and to small blood clots came out.  Urology recommended CT of the abdomen and pelvis and bladder irrigation as due to Foley many pretension he is at high risk of UTI.  Started on CBI   Interval History:  Further clots overnight with need for 3way foley placement. Patient being taken to OR today for further management.   Assessment and Plan:  Recurrent gross hematuria: - s/p cystoscopy with lithotripsy, balloon dilation and urethral resection of bladder tumor on 09/14/2022 by Dr. Jenne Pane -Appreciate urology assistance with CBI; follow-up results from OR for further management given ongoing clots -Continue trending hemoglobin.  - continue empiric rocephin given interventions   Complicated UTI (urinary tract infection): UA positive for pyuria urine culture has been sent. Blood cultures have been sent. - continue Rocephin    Hypovolemic hyponatremia: Started on IV fluids, improving with IV fluid.   History of bladder tumor status post resection on 09/14/2022 -Prostate seed implants noted on CT.  No discrete abnormalities otherwise noted   Hyperglycemia -A1c 5.4% - suspect some elevation from steroid in OR but should settle back  Old records reviewed in assessment of this patient  Antimicrobials: Rocephin 09/27/22 >> current   DVT prophylaxis:  SCDs Start: 09/27/22 1933   Code Status:   Code Status: Full  Code  Mobility Assessment (last 72 hours)     Mobility Assessment     Row Name 09/29/22 0908 09/28/22 2300 09/28/22 1510 09/28/22 0835     Does patient have an order for bedrest or is patient medically unstable No - Continue assessment No - Continue assessment -- No - Continue assessment    What is the highest level of mobility based on the progressive mobility assessment? Level 2 (Chairfast) - Balance while sitting on edge of bed and cannot stand Level 2 (Chairfast) - Balance while sitting on edge of bed and cannot stand Level 2 (Chairfast) - Balance while sitting on edge of bed and cannot stand Level 3 (Stands with assist) - Balance while standing  and cannot march in place    Is the above level different from baseline mobility prior to current illness? Yes - Recommend PT order Yes - Recommend PT order -- Yes - Recommend PT order              Objective: Blood pressure (!) 172/63, pulse 92, temperature 98 F (36.7 C), temperature source Oral, resp. rate 14, height 5\' 2"  (1.575 m), weight 58.9 kg, SpO2 98 %.  Examination:  Physical Exam Constitutional:      General: He is not in acute distress.    Appearance: Normal appearance.  HENT:     Head: Normocephalic and atraumatic.     Mouth/Throat:     Mouth: Mucous membranes are moist.  Eyes:     Extraocular Movements: Extraocular movements intact.  Cardiovascular:     Rate and Rhythm: Normal rate and regular rhythm.  Pulmonary:  Effort: Pulmonary effort is normal. No respiratory distress.     Breath sounds: Normal breath sounds. No wheezing.  Abdominal:     General: Bowel sounds are normal. There is no distension.     Palpations: Abdomen is soft.     Tenderness: There is no abdominal tenderness.  Genitourinary:    Comments: Foley in place with pink tinged urine noted in bag Musculoskeletal:        General: Normal range of motion.     Cervical back: Normal range of motion and neck supple.  Skin:    General: Skin is warm  and dry.  Neurological:     General: No focal deficit present.     Mental Status: He is alert.  Psychiatric:        Mood and Affect: Mood normal.        Behavior: Behavior normal.       Data Reviewed: Results for orders placed or performed during the hospital encounter of 09/27/22 (from the past 24 hour(s))  Glucose, capillary     Status: Abnormal   Collection Time: 09/28/22  4:08 PM  Result Value Ref Range   Glucose-Capillary 103 (H) 70 - 99 mg/dL  Hemoglobin and hematocrit, blood     Status: Abnormal   Collection Time: 09/28/22  6:17 PM  Result Value Ref Range   Hemoglobin 9.4 (L) 13.0 - 17.0 g/dL   HCT 40.9 (L) 81.1 - 91.4 %  Basic metabolic panel     Status: Abnormal   Collection Time: 09/28/22  6:17 PM  Result Value Ref Range   Sodium 131 (L) 135 - 145 mmol/L   Potassium 3.6 3.5 - 5.1 mmol/L   Chloride 100 98 - 111 mmol/L   CO2 22 22 - 32 mmol/L   Glucose, Bld 138 (H) 70 - 99 mg/dL   BUN 12 8 - 23 mg/dL   Creatinine, Ser 7.82 0.61 - 1.24 mg/dL   Calcium 8.0 (L) 8.9 - 10.3 mg/dL   GFR, Estimated >95 >62 mL/min   Anion gap 9 5 - 15  Glucose, capillary     Status: None   Collection Time: 09/28/22  8:34 PM  Result Value Ref Range   Glucose-Capillary 98 70 - 99 mg/dL  Glucose, capillary     Status: Abnormal   Collection Time: 09/29/22  7:26 AM  Result Value Ref Range   Glucose-Capillary 111 (H) 70 - 99 mg/dL  Hemoglobin and hematocrit, blood     Status: Abnormal   Collection Time: 09/29/22  8:56 AM  Result Value Ref Range   Hemoglobin 8.5 (L) 13.0 - 17.0 g/dL   HCT 13.0 (L) 86.5 - 78.4 %  Basic metabolic panel     Status: Abnormal   Collection Time: 09/29/22  8:56 AM  Result Value Ref Range   Sodium 134 (L) 135 - 145 mmol/L   Potassium 3.7 3.5 - 5.1 mmol/L   Chloride 100 98 - 111 mmol/L   CO2 24 22 - 32 mmol/L   Glucose, Bld 119 (H) 70 - 99 mg/dL   BUN 11 8 - 23 mg/dL   Creatinine, Ser 6.96 0.61 - 1.24 mg/dL   Calcium 8.1 (L) 8.9 - 10.3 mg/dL   GFR,  Estimated >29 >52 mL/min   Anion gap 10 5 - 15    I have reviewed pertinent nursing notes, vitals, labs, and images as necessary. I have ordered labwork to follow up on as indicated.  I have reviewed the last notes from staff  over past 24 hours. I have discussed patient's care plan and test results with nursing staff, CM/SW, and other staff as appropriate.  Time spent: Greater than 50% of the 55 minute visit was spent in counseling/coordination of care for the patient as laid out in the A&P.   LOS: 2 days   Lewie Chamber, MD [facility] 09/29/2022, 2:28 PM

## 2022-09-29 NOTE — Progress Notes (Signed)
Urology Inpatient Progress Report  Hyponatremia [E87.1] Gross hematuria [R31.0] Hematuria, unspecified type [R31.9]  Procedure(s): CYSTOSCOPY WITH FULGERATION  Day of Surgery   Intv/Subj: Patient passed voiding trial and was urinating clear yellow urine on my last assessment yesterday.  Unfortunately last night he went back into obstruction requiring placement of a three-way catheter and continuous bladder irrigation.  Principal Problem:   Gross hematuria Active Problems:   Complicated UTI (urinary tract infection)   Hyponatremia   Pressure injury of skin   Hyperglycemia   Bladder tumor  Current Facility-Administered Medications  Medication Dose Route Frequency Provider Last Rate Last Admin   0.9 %  sodium chloride infusion   Intravenous Continuous Darlin Drop, DO 75 mL/hr at 09/28/22 1610 New Bag at 09/28/22 9604   acetaminophen (TYLENOL) tablet 650 mg  650 mg Oral Q6H PRN Dow Adolph N, DO       cefTRIAXone (ROCEPHIN) 2 g in sodium chloride 0.9 % 100 mL IVPB  2 g Intravenous Q24H Dow Adolph N, DO 200 mL/hr at 09/28/22 2124 2 g at 09/28/22 2124   Chlorhexidine Gluconate Cloth 2 % PADS 6 each  6 each Topical Daily Kasandra Knudsen D, MD   6 each at 09/28/22 0945   cyanocobalamin (VITAMIN B12) tablet 1,000 mcg  1,000 mcg Oral Daily Dow Adolph N, DO   1,000 mcg at 09/28/22 0945   HYDROmorphone (DILAUDID) injection 0.5 mg  0.5 mg Intravenous Q4H PRN Dow Adolph N, DO   0.5 mg at 09/29/22 0321   melatonin tablet 5 mg  5 mg Oral QHS PRN Dow Adolph N, DO       oxyCODONE (Oxy IR/ROXICODONE) immediate release tablet 5 mg  5 mg Oral Q6H PRN Dow Adolph N, DO   5 mg at 09/29/22 0431   polyethylene glycol (MIRALAX / GLYCOLAX) packet 17 g  17 g Oral Daily PRN Dow Adolph N, DO       prochlorperazine (COMPAZINE) injection 5 mg  5 mg Intravenous Q6H PRN Dow Adolph N, DO   5 mg at 09/27/22 2117   sodium chloride irrigation 0.9 % 3,000 mL  3,000 mL Irrigation Continuous Terald Sleeper, MD 0 mL/hr at 09/28/22 0519 3,000 mL at 09/29/22 0647   tamsulosin (FLOMAX) capsule 0.4 mg  0.4 mg Oral QHS Dow Adolph N, DO   0.4 mg at 09/28/22 2124     Objective: Vital: Vitals:   09/28/22 1453 09/28/22 1841 09/28/22 2039 09/29/22 0609  BP: (!) 106/49 (!) 105/46 (!) 113/41 (!) 144/57  Pulse: 88 92 88 97  Resp: 18 15  16   Temp: 98.7 F (37.1 C) 98 F (36.7 C) 98.9 F (37.2 C) 98 F (36.7 C)  TempSrc: Oral Oral Oral Oral  SpO2: 99% 96% 97% 96%  Weight:      Height:       I/Os: I/O last 3 completed shifts: In: 6368.6 [P.O.:360; I.V.:1808.6; Other:3000; IV Piggyback:1200] Out: 54098 [Urine:17350]  Physical Exam:  General: Patient is in no apparent distress Lungs: Normal respiratory effort, chest expands symmetrically. GI: The abdomen is soft and nontender Foley: 3 way foley draining clear medium red urine on slow drip, no clots  Ext: lower extremities symmetric  Lab Results: Recent Labs    09/27/22 1817 09/27/22 2035 09/28/22 0533 09/28/22 0925 09/28/22 1817  WBC 17.2*  --  11.8*  --   --   HGB 10.8*   < > 9.6* 9.7* 9.4*  HCT 30.4*   < > 27.3* 28.4*  27.1*   < > = values in this interval not displayed.   Recent Labs    09/28/22 0533 09/28/22 0925 09/28/22 1817  NA 128* 129* 131*  K 3.4* 3.6 3.6  CL 97* 99 100  CO2 24 21* 22  GLUCOSE 91 92 138*  BUN 14 11 12   CREATININE 0.54* 0.53* 0.75  CALCIUM 8.3* 8.3* 8.0*   No results for input(s): "LABPT", "INR" in the last 72 hours. No results for input(s): "LABURIN" in the last 72 hours. Results for orders placed or performed during the hospital encounter of 09/27/22  Urine Culture     Status: Abnormal (Preliminary result)   Collection Time: 09/27/22  3:28 PM   Specimen: Urine, Clean Catch  Result Value Ref Range Status   Specimen Description   Final    URINE, CLEAN CATCH Performed at [facility], [address] [place], [place] [address]    Special Requests   Final     NONE Performed at [facility], [address] [place], [place] [address]    Culture (A)  Final    >=100,000 COLONIES/mL GRAM NEGATIVE RODS SUSCEPTIBILITIES TO FOLLOW Performed at [facility], [address], [place], [place] [address]    Report Status PENDING  Incomplete  Culture, blood (Routine X 2) w Reflex to ID Panel     Status: None (Preliminary result)   Collection Time: 09/27/22  8:25 PM   Specimen: BLOOD RIGHT FOREARM  Result Value Ref Range Status   Specimen Description   Final    BLOOD RIGHT FOREARM Performed at [facility], [address], [place], [place] [address]    Special Requests   Final    BOTTLES DRAWN AEROBIC AND ANAEROBIC Blood Culture adequate volume Performed at [facility], [address] [place], [place] [address]    Culture   Final    NO GROWTH < 24 HOURS Performed at [facility], [address], [place], [place] [address]    Report Status PENDING  Incomplete  Culture, blood (Routine X 2) w Reflex to ID Panel     Status: None (Preliminary result)   Collection Time: 09/27/22  8:35 PM   Specimen: BLOOD RIGHT HAND  Result Value Ref Range Status   Specimen Description   Final    BLOOD RIGHT HAND Performed at [facility], [address], [place], [place] [address]    Special Requests   Final    BOTTLES DRAWN AEROBIC AND ANAEROBIC Blood Culture adequate volume Performed at [facility], [address] [place], [place] [address]    Culture   Final    NO GROWTH < 24 HOURS Performed at [facility], [address], [place], [place] [address]    Report Status PENDING  Incomplete  MRSA Next Gen by PCR, Nasal     Status: None   Collection Time: 09/28/22 12:05 PM   Specimen: Nasal Mucosa; Nasal Swab  Result Value Ref Range Status   MRSA by PCR Next Gen NOT DETECTED NOT DETECTED Final    Comment: (NOTE) The GeneXpert MRSA Assay (FDA approved for NASAL specimens  only), is one component of a comprehensive MRSA colonization surveillance program. It is not intended to diagnose MRSA infection nor to guide or monitor treatment for MRSA infections. Test performance is not FDA approved in patients less than 68 years old. Performed at [facility], [address] [place], [place] [address]     Studies/Results: CT PELVIS WO CONTRAST  Result Date: 09/27/2022 CLINICAL DATA:  Evaluating for bladder clots post urologic procedure. Bloody drainage at Foley insertion site. EXAM: CT PELVIS WITHOUT CONTRAST TECHNIQUE: Multidetector CT imaging of the pelvis was performed following the standard protocol without intravenous contrast. RADIATION DOSE REDUCTION: This exam was performed according to the departmental dose-optimization program which includes automated exposure control, adjustment of the mA and/or kV according to patient size and/or use of iterative reconstruction technique. COMPARISON:  08/25/2022 FINDINGS: Urinary Tract: Foley catheter with inflated balloon in the bladder. The bladder is decompressed. Decompression of the bladder limits evaluation but no discrete abnormality is identified. Bowel: Visualized large and small bowel are not abnormally distended. No inflammatory changes are appreciated. The appendix is normal. Vascular/Lymphatic: Calcification of the iliac arteries. No aneurysm. No significant lymphadenopathy. Reproductive:  Seed implants in the prostate gland. Other: No free air or free fluid in the pelvis. Postoperative changes in the left groin. Musculoskeletal: Degenerative changes in the spine. No focal bone lesions. IMPRESSION: 1. Bladder is contracted around a Foley catheter, limiting evaluation. No discrete abnormality is identified as visualized. If there is clinical concern for residual bladder lesion or hematoma, consider repeat imaging with clamping of the Foley catheter. 2. Prostate seed implants. 3. Atherosclerotic changes in  the iliac arteries. Electronically Signed   By: Burman Nieves M.D.   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   Plan: #Gross hematuria Ongoing gross hematuria postoperatively.  Underwent voiding trial successfully with clear yellow urine yesterday.  Back into urinary retention secondary to clot obstruction last night requiring placement of three-way Foley catheter, hand irrigation, and CBI. Hgb has not been collected this morning. To the OR for cystoscopy with clot evacuation and fulguration around noon-Dr. Guilford Shi, NP [facility] Pager: [phone]  09/29/2022, 8:52 AM

## 2022-09-29 NOTE — Progress Notes (Signed)
RN notified on call that Jeffrey Porter is very uncomfortable and expressing that his bladder and urethra are in pain. He is urinating, frequent small amounts in the male purewick and output is bloody and has clots. [name] is afebrile and has stable vital signs. Bladder scan initially read , second scan revealed over . With findings suggestive of clot retention, advised three way hematuria catheter be replaced and intially hand irrigated until light pink. Will notify MD and NP in AM for rounding and may place back on CBI if necessary.

## 2022-09-29 NOTE — Progress Notes (Signed)
PT Cancellation Note  Patient Details Name: Jeffrey Porter MRN: [id number] DOB: 05-Sep-1931   Cancelled Treatment:    Reason Eval/Treat Not Completed: Patient at procedure or test/unavailable. Will follow.    Ralene Bathe Kistler PT 09/29/2022  Acute Rehabilitation Services  Office [phone]

## 2022-09-29 NOTE — TOC Progression Note (Addendum)
Transition of Care [facility]) - Progression Note    Patient Details  Name: Woods Mascola MRN: [id number] Date of Birth: 1932/01/12  [facility] CM/SW Contact  Howell Rucks, RN Phone Number: 09/29/2022, 9:44 AM  Clinical Narrative: Bed offers accepted in SNF HUB, pt prefers [facility], NCM called to [facility], admissions coordinator at [place] [phone]), left vm with NCM name and phone number requesting call back. TOC will continue to follow.       -3:32pm NCM called to [facility], admissions coordinator at [facility] [phone]), left vm with NCM name and phone number requesting call back.    Expected Discharge Plan: Assisted Living Barriers to Discharge: Continued Medical Work up  Expected Discharge Plan and Services   Discharge Planning Services: CM Consult Post Acute Care Choice: Resumption of Svcs/PTA Provider (return ALF possible HHPT facility uses legacy if needed.) Living arrangements for the past 2 months: Assisted Living Facility                                       Social Determinants of Health (SDOH) Interventions SDOH Screenings   Food Insecurity: No Food Insecurity (09/28/2022)  Housing: Low Risk  (09/28/2022)  Transportation Needs: No Transportation Needs (09/28/2022)  Utilities: Not At Risk (09/28/2022)  Tobacco Use: Medium Risk (09/27/2022)    Readmission Risk Interventions    09/28/2022    4:40 PM 09/28/2022   10:25 AM  Readmission Risk Prevention Plan  Transportation Screening Complete Complete  PCP or Specialist Appt within 3-5 Days  Complete  HRI or Home Care Consult Complete Complete  Social Work Consult for Recovery Care Planning/Counseling  Complete  Palliative Care Screening  Complete  Medication Review [employer])  Complete

## 2022-09-29 NOTE — Anesthesia Postprocedure Evaluation (Signed)
Anesthesia Post Note  Patient: Jeffrey Porter  Procedure(s) Performed: CYSTOSCOPY WITH FULGARATION AND CLOT EVACUATION WITH STENT PLACEMENT (Right)     Patient location during evaluation: PACU Anesthesia Type: General Level of consciousness: awake and alert, patient cooperative and oriented Pain management: pain level controlled Vital Signs Assessment: post-procedure vital signs reviewed and stable Respiratory status: spontaneous breathing, nonlabored ventilation, respiratory function stable and patient connected to nasal cannula oxygen Cardiovascular status: blood pressure returned to baseline and stable Postop Assessment: no apparent nausea or vomiting Anesthetic complications: no   No notable events documented.  Last Vitals:  Vitals:   09/28/22 2039 09/29/22 0609  BP: (!) 113/41 (!) 144/57  Pulse: 88 97  Resp:  16  Temp: 37.2 C 36.7 C  SpO2: 97% 96%    Last Pain:  Vitals:   09/29/22 0609  TempSrc: Oral  PainSc:                  [name],E. Nesa Distel

## 2022-09-29 NOTE — Anesthesia Procedure Notes (Signed)
Procedure Name: LMA Insertion Date/Time: 09/29/2022 12:50 PM  Performed by: Vanessa [place], CRNAPre-anesthesia Checklist: Emergency Drugs available, Patient identified, Suction available and Patient being monitored Patient Re-evaluated:Patient Re-evaluated prior to induction Oxygen Delivery Method: Circle system utilized Preoxygenation: Pre-oxygenation with 100% oxygen Induction Type: IV induction Ventilation: Mask ventilation without difficulty LMA: LMA inserted LMA Size: 4.0 Number of attempts: 1 Placement Confirmation: positive ETCO2 and breath sounds checked- equal and bilateral Tube secured with: Tape Dental Injury: Teeth and Oropharynx as per pre-operative assessment

## 2022-09-29 NOTE — Interval H&P Note (Signed)
History and Physical Interval Note: Patient had void trial yesterday that was initially successful with clear urine.  Overnight he develops clot urinary retention requiring CBI.    09/29/2022 12:12 PM  Jeffrey Porter  has presented today for surgery, with the diagnosis of Gross Hematuria with Clot Fulguration.  The various methods of treatment have been discussed with the patient and family. After consideration of risks, benefits and other options for treatment, the patient has consented to  Procedure(s): CYSTOSCOPY WITH FULGERATION (N/A) as a surgical intervention.  The patient's history has been reviewed, patient examined, no change in status, stable for surgery.  I have reviewed the patient's chart and labs.  Questions were answered to the patient's satisfaction.     Kameisha Malicki D Roddy Bellamy

## 2022-09-30 ENCOUNTER — Encounter (HOSPITAL_COMMUNITY): Payer: Self-pay | Admitting: Urology

## 2022-09-30 DIAGNOSIS — R310 Gross hematuria: Secondary | ICD-10-CM | POA: Diagnosis not present

## 2022-09-30 LAB — URINE CULTURE: Culture: >=100000 — AB

## 2022-09-30 LAB — GLUCOSE, CAPILLARY
Glucose-Capillary: 96 mg/dL (ref 70–99)
Glucose-Capillary: 112 mg/dL — ABNORMAL HIGH (ref 70–99)
Glucose-Capillary: 147 mg/dL — ABNORMAL HIGH (ref 70–99)

## 2022-09-30 LAB — HEMOGLOBIN AND HEMATOCRIT, BLOOD
HCT: 24.2 % — ABNORMAL LOW (ref 39.0–52.0)
Hemoglobin: 8.3 g/dL — ABNORMAL LOW (ref 13.0–17.0)

## 2022-09-30 LAB — CULTURE, BLOOD (ROUTINE X 2): Culture: NO GROWTH

## 2022-09-30 NOTE — Plan of Care (Signed)
  Problem: Fluid Volume: Goal: Ability to maintain a balanced intake and output will improve Outcome: Progressing   Problem: Metabolic: Goal: Ability to maintain appropriate glucose levels will improve Outcome: Progressing   Problem: Nutritional: Goal: Maintenance of adequate nutrition will improve Outcome: Progressing Goal: Progress toward achieving an optimal weight will improve Outcome: Progressing   

## 2022-09-30 NOTE — TOC Progression Note (Addendum)
Transition of Care [facility]) - Progression Note    Patient Details  Name: Jeffrey Porter MRN: [id number] Date of Birth: [birth year]  Transition of [facility] CM/SW Contact  Howell Rucks, RN Phone Number: 09/30/2022, 9:18 AM  Clinical Narrative: Voicemail received 09/29/22 at 3:47pm from [place] with [facility] , reports admissions coordinator  will be back in office tomorrow 7/7, will review referral at that time and make determination on bed offer.   -11:24am Met with pt at bedside, pt dowsy, gave verbal permission to speak with his dtr [name]). NCM call to Lura, NCM introduced self and reason for call, informed short term rehab-SNF bed offers left at pt's bedside, also informed NCM reached out to [facility] to review for  bed offer as facility is pt's preference, [facility] is reviewing for bed offer and will update NCM with determination. NCM will call Lura with update, Lura voiced understanding. TOC will continue to follow.     Expected Discharge Plan: Assisted Living Barriers to Discharge: Continued Medical Work up  Expected Discharge Plan and Services   Discharge Planning Services: CM Consult Post Acute Care Choice: Resumption of Svcs/PTA Provider (return ALF possible HHPT facility uses legacy if needed.) Living arrangements for the past 2 months: Assisted Living Facility                                       Social Determinants of Health (SDOH) Interventions SDOH Screenings   Food Insecurity: No Food Insecurity (09/28/2022)  Housing: Low Risk  (09/28/2022)  Transportation Needs: No Transportation Needs (09/28/2022)  Utilities: Not At Risk (09/28/2022)  Tobacco Use: Medium Risk (09/30/2022)    Readmission Risk Interventions    09/28/2022    4:40 PM 09/28/2022   10:25 AM  Readmission Risk Prevention Plan  Transportation Screening Complete Complete  PCP or Specialist Appt within 3-5 Days  Complete  HRI or Home Care Consult Complete Complete  Social Work Consult for Recovery Care  Planning/Counseling  Complete  Palliative Care Screening  Complete  Medication Review Ocean[employer])  Complete

## 2022-09-30 NOTE — Care Management Important Message (Signed)
Important Message  Patient Details IM Letter given. Name: Eberardo Kallmeyer MRN: [id number] Date of Birth: 1932-01-25   Medicare Important Message Given:  Yes     Caren Macadam 09/30/2022, 11:33 AM

## 2022-09-30 NOTE — Progress Notes (Signed)
Progress Note    Jeffrey Porter   ZOX:[id number]  DOB: 27-Oct-1931  DOA: 09/27/2022     3 PCP: Sigmund Hazel, MD  Initial CC: hematuria  Hospital Course: Jeffrey Wurdeman is an 89 y.o. male past medical history significant for urethral stricture urethral calculi bladder tumor status post cystoscopy with laser treated.  See final dilation of urethra secondary to stricture and transurethral resection of bladder tumor on 09/14/2022 discharged home with a Foley catheter since then has had gross hematuria suprapubic pain urology was consulted and saw the patient irrigated the patient and to small blood clots came out.  Urology recommended CT of the abdomen and pelvis and bladder irrigation as due to Foley many pretension he is at high risk of UTI.  Started on CBI   Interval History:  Underwent cystoscopy with fulguration and right ureteral stent placement yesterday with urology.  Still having pink-tinged urine noted in Foley bag this morning.  Denies any pain and otherwise doing okay.  Assessment and Plan:  Recurrent gross hematuria: - s/p cystoscopy with lithotripsy, balloon dilation and urethral resection of bladder tumor on 09/14/2022 by Dr. Jenne Pane -Appreciate urology assistance with CBI; he also underwent cysto with R ureteral stent placement and fulguration of prior resection site on 6/5 - Hgb stable, 8.3 g/dL this am - still some pink tinged urine in foley but CBI had been paused when seen for trial  - continue empiric rocephin given interventions   Complicated UTI (urinary tract infection): UA positive for pyuria urine culture has been sent. Blood cultures have been sent. - continue Rocephin    Hypovolemic hyponatremia: - improved with IVF   History of bladder tumor status post resection on 09/14/2022 -Prostate seed implants noted on CT.  No discrete abnormalities otherwise noted   Hyperglycemia -A1c 5.4% -Continue diet control  Old records reviewed in assessment of this  patient  Antimicrobials: Rocephin 09/27/22 >> current   DVT prophylaxis:  SCDs Start: 09/27/22 1933   Code Status:   Code Status: Full Code  Mobility Assessment (last 72 hours)     Mobility Assessment     Row Name 09/30/22 1030 09/29/22 0908 09/28/22 2300 09/28/22 1510 09/28/22 0835   Does patient have an order for bedrest or is patient medically unstable No - Continue assessment No - Continue assessment No - Continue assessment -- No - Continue assessment   What is the highest level of mobility based on the progressive mobility assessment? Level 2 (Chairfast) - Balance while sitting on edge of bed and cannot stand Level 2 (Chairfast) - Balance while sitting on edge of bed and cannot stand Level 2 (Chairfast) - Balance while sitting on edge of bed and cannot stand Level 2 (Chairfast) - Balance while sitting on edge of bed and cannot stand Level 3 (Stands with assist) - Balance while standing  and cannot march in place   Is the above level different from baseline mobility prior to current illness? Yes - Recommend PT order Yes - Recommend PT order Yes - Recommend PT order -- Yes - Recommend PT order             Objective: Blood pressure (!) 127/55, pulse (!) 101, temperature 99 F (37.2 C), temperature source Oral, resp. rate 16, height 5\' 2"  (1.575 m), weight 58.9 kg, SpO2 97 %.  Examination:  Physical Exam Constitutional:      General: He is not in acute distress.    Appearance: Normal appearance.  HENT:     Head:  Normocephalic and atraumatic.     Mouth/Throat:     Mouth: Mucous membranes are moist.  Eyes:     Extraocular Movements: Extraocular movements intact.  Cardiovascular:     Rate and Rhythm: Normal rate and regular rhythm.  Pulmonary:     Effort: Pulmonary effort is normal. No respiratory distress.     Breath sounds: Normal breath sounds. No wheezing.  Abdominal:     General: Bowel sounds are normal. There is no distension.     Palpations: Abdomen is soft.      Tenderness: There is no abdominal tenderness.  Genitourinary:    Comments: Foley in place with pink tinged urine noted in bag Musculoskeletal:        General: Normal range of motion.     Cervical back: Normal range of motion and neck supple.  Skin:    General: Skin is warm and dry.  Neurological:     General: No focal deficit present.     Mental Status: He is alert.  Psychiatric:        Mood and Affect: Mood normal.        Behavior: Behavior normal.       Data Reviewed: Results for orders placed or performed during the hospital encounter of 09/27/22 (from the past 24 hour(s))  Glucose, capillary     Status: Abnormal   Collection Time: 09/29/22  9:42 PM  Result Value Ref Range   Glucose-Capillary 129 (H) 70 - 99 mg/dL  Hemoglobin and hematocrit, blood     Status: Abnormal   Collection Time: 09/30/22  4:59 AM  Result Value Ref Range   Hemoglobin 8.3 (L) 13.0 - 17.0 g/dL   HCT 40.9 (L) 81.1 - 91.4 %  Glucose, capillary     Status: None   Collection Time: 09/30/22  7:21 AM  Result Value Ref Range   Glucose-Capillary 96 70 - 99 mg/dL  Glucose, capillary     Status: Abnormal   Collection Time: 09/30/22 11:40 AM  Result Value Ref Range   Glucose-Capillary 112 (H) 70 - 99 mg/dL    I have reviewed pertinent nursing notes, vitals, labs, and images as necessary. I have ordered labwork to follow up on as indicated.  I have reviewed the last notes from staff over past 24 hours. I have discussed patient's care plan and test results with nursing staff, CM/SW, and other staff as appropriate.    LOS: 3 days   Lewie Chamber, MD [facility] 09/30/2022, 4:57 PM

## 2022-09-30 NOTE — Progress Notes (Signed)
Urology Inpatient Progress Report  Hyponatremia [E87.1] Gross hematuria [R31.0] Hematuria, unspecified type [R31.9]  Procedure(s): CYSTOSCOPY WITH FULGARATION AND CLOT EVACUATION WITH STENT PLACEMENT  1 Day Post-Op   Intv/Subj: Patient passed voiding trial and was urinating clear yellow urine on my last assessment yesterday.  Unfortunately last night he went back into obstruction requiring placement of a three-way catheter and continuous bladder irrigation.  Principal Problem:   Gross hematuria Active Problems:   Complicated UTI (urinary tract infection)   Hyponatremia   Pressure injury of skin   Hyperglycemia   Bladder tumor  Current Facility-Administered Medications  Medication Dose Route Frequency Provider Last Rate Last Admin   acetaminophen (TYLENOL) tablet 650 mg  650 mg Oral Q6H PRN Dow Adolph N, DO       cefTRIAXone (ROCEPHIN) 2 g in sodium chloride 0.9 % 100 mL IVPB  2 g Intravenous Q24H Dow Adolph N, DO 200 mL/hr at 09/29/22 2125 2 g at 09/29/22 2125   Chlorhexidine Gluconate Cloth 2 % PADS 6 each  6 each Topical Daily Kasandra Knudsen D, MD   6 each at 09/29/22 1030   cholecalciferol (VITAMIN D3) 25 MCG (1000 UNIT) tablet 2,000 Units  2,000 Units Oral Daily Kasandra Knudsen D, MD   2,000 Units at 09/29/22 1850   cyanocobalamin (VITAMIN B12) tablet 1,000 mcg  1,000 mcg Oral Daily Dow Adolph N, DO   1,000 mcg at 09/30/22 0934   HYDROmorphone (DILAUDID) injection 0.5 mg  0.5 mg Intravenous Q4H PRN Dow Adolph N, DO   0.5 mg at 09/29/22 0321   latanoprost (XALATAN) 0.005 % ophthalmic solution 1 drop  1 drop Both Eyes QHS Kasandra Knudsen D, MD   1 drop at 09/29/22 2130   magnesium oxide (MAG-OX) tablet 400 mg  400 mg Oral Daily Kasandra Knudsen D, MD   400 mg at 09/29/22 1849   melatonin tablet 5 mg  5 mg Oral QHS PRN Dow Adolph N, DO   5 mg at 09/29/22 2121   multivitamin with minerals tablet 1 tablet  1 tablet Oral Q breakfast Kasandra Knudsen D, MD   1 tablet at  09/30/22 1610   niacin (VITAMIN B3) tablet 100 mg  100 mg Oral QHS Kasandra Knudsen D, MD       oxyCODONE (Oxy IR/ROXICODONE) immediate release tablet 5 mg  5 mg Oral Q6H PRN Dow Adolph N, DO   5 mg at 09/29/22 0431   polyethylene glycol (MIRALAX / GLYCOLAX) packet 17 g  17 g Oral Daily PRN Dow Adolph N, DO       prochlorperazine (COMPAZINE) injection 5 mg  5 mg Intravenous Q6H PRN Dow Adolph N, DO   5 mg at 09/27/22 2117   sodium chloride irrigation 0.9 % 3,000 mL  3,000 mL Irrigation Continuous Terald Sleeper, MD 0 mL/hr at 09/28/22 0519 3,000 mL at 09/29/22 0647   tamsulosin (FLOMAX) capsule 0.4 mg  0.4 mg Oral QHS Dow Adolph N, DO   0.4 mg at 09/28/22 2124   zinc sulfate capsule 220 mg  220 mg Oral Daily Kasandra Knudsen D, MD   220 mg at 09/29/22 1850     Objective: Vital: Vitals:   09/29/22 1445 09/29/22 1509 09/29/22 2010 09/30/22 0420  BP: (!) 149/50 (!) 134/44 (!) 149/56 (!) 141/51  Pulse: 98 82 90 84  Resp: 12 14 18 18   Temp: 97.8 F (36.6 C) 98 F (36.7 C) 98 F (36.7 C) 99.1 F (37.3 C)  TempSrc:  Oral Oral  Oral  SpO2: 98% 97% 98% 95%  Weight:      Height:       I/Os: I/O last 3 completed shifts: In: 7957.3 [P.O.:720; I.V.:1037.3; Other:6000; IV Piggyback:200] Out: [address] [Urine:18150]  Physical Exam:  General: Patient is in no apparent distress Lungs: Normal respiratory effort, chest expands symmetrically. GI: The abdomen is soft and nontender Foley: 3 way foley draining clear medium red urine on slow drip, no clots  Ext: lower extremities symmetric  Lab Results: Recent Labs    09/27/22 1817 09/27/22 2035 09/28/22 0533 09/28/22 0925 09/28/22 1817 09/29/22 0856 09/30/22 0459  WBC 17.2*  --  11.8*  --   --   --   --   HGB 10.8*   < > 9.6*   < > 9.4* 8.5* 8.3*  HCT 30.4*   < > 27.3*   < > 27.1* 25.4* 24.2*   < > = values in this interval not displayed.   Recent Labs    09/28/22 0925 09/28/22 1817 09/29/22 0856  NA 129* 131* 134*  K 3.6  3.6 3.7  CL 99 100 100  CO2 21* 22 24  GLUCOSE 92 138* 119*  BUN 11 12 11   CREATININE 0.53* 0.75 0.73  CALCIUM 8.3* 8.0* 8.1*   No results for input(s): "LABPT", "INR" in the last 72 hours. No results for input(s): "LABURIN" in the last 72 hours. Results for orders placed or performed during the hospital encounter of 09/27/22  Urine Culture     Status: Abnormal   Collection Time: 09/27/22  3:28 PM   Specimen: Urine, Clean Catch  Result Value Ref Range Status   Specimen Description   Final    URINE, CLEAN CATCH Performed at [facility], [address] [place], [place] [address]    Special Requests   Final    NONE Performed at [facility], [address] [place], [place] [address]    Culture >=100,000 COLONIES/mL KLEBSIELLA PNEUMONIAE (A)  Final   Report Status 09/30/2022 FINAL  Final   Organism ID, Bacteria KLEBSIELLA PNEUMONIAE (A)  Final      Susceptibility   Klebsiella pneumoniae - MIC*    AMPICILLIN RESISTANT Resistant     CEFAZOLIN <=4 SENSITIVE Sensitive     CEFEPIME <=0.12 SENSITIVE Sensitive     CEFTRIAXONE <=0.25 SENSITIVE Sensitive     CIPROFLOXACIN <=0.25 SENSITIVE Sensitive     GENTAMICIN <=1 SENSITIVE Sensitive     IMIPENEM <=0.25 SENSITIVE Sensitive     NITROFURANTOIN 128 RESISTANT Resistant     TRIMETH/SULFA <=20 SENSITIVE Sensitive     AMPICILLIN/SULBACTAM 4 SENSITIVE Sensitive     PIP/TAZO <=4 SENSITIVE Sensitive     * >=100,000 COLONIES/mL KLEBSIELLA PNEUMONIAE  Culture, blood (Routine X 2) w Reflex to ID Panel     Status: None (Preliminary result)   Collection Time: 09/27/22  8:25 PM   Specimen: BLOOD RIGHT FOREARM  Result Value Ref Range Status   Specimen Description   Final    BLOOD RIGHT FOREARM Performed at [facility], [address], [place], [place] [address]    Special Requests   Final    BOTTLES DRAWN AEROBIC AND ANAEROBIC Blood Culture adequate volume Performed at [facility], [address] [place], [place] [address]    Culture   Final    NO GROWTH 3 DAYS Performed at [facility], [address], [place], [place] [address]    Report Status PENDING  Incomplete  Culture, blood (Routine X 2) w Reflex to ID Panel     Status: None (Preliminary result)   Collection Time: 09/27/22  8:35 PM   Specimen: BLOOD RIGHT HAND  Result Value Ref Range Status   Specimen Description   Final    BLOOD RIGHT HAND Performed at [facility], [address], [place], [place] [address]    Special Requests   Final    BOTTLES DRAWN AEROBIC AND ANAEROBIC Blood Culture adequate volume Performed at [facility], [address] [place], [place] [address]    Culture   Final    NO GROWTH 3 DAYS Performed at [facility], [address], [place], [place] [address]    Report Status PENDING  Incomplete  MRSA Next Gen by PCR, Nasal     Status: None   Collection Time: 09/28/22 12:05 PM   Specimen: Nasal Mucosa; Nasal Swab  Result Value Ref Range Status   MRSA by PCR Next Gen NOT DETECTED NOT DETECTED Final    Comment: (NOTE) The GeneXpert MRSA Assay (FDA approved for NASAL specimens only), is one component of a comprehensive MRSA colonization surveillance program. It is not intended to diagnose MRSA infection nor to guide or monitor treatment for MRSA infections. Test performance is not FDA approved in patients less than 75 years old. Performed at [facility], [address], [place], [place] [address]     Studies/Results: No results found.  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   Plan: #Gross hematuria Ongoing gross hematuria postoperatively.  Underwent voiding trial successfully with clear yellow urine 6/4.  Back into urinary retention secondary to clot obstruction that night requiring  placement of three-way Foley catheter, hand irrigation, and CBI.  Clot evacuation and fulguration on 6/5.  CBI on very low gtt. with only lightly blood-tinged irrigant in tubing.  CBI clamped on morning rounds with plan to reevaluate later today.   Elmon Kirschner, NP [facility] Pager: [phone]  09/30/2022, 9:41 AM

## 2022-09-30 NOTE — Progress Notes (Signed)
PT Cancellation Note  Patient Details Name: Jeffrey Porter MRN: [id number] DOB: 25-Sep-1931   Cancelled Treatment:     Therapist in to see pt who declined any activity stating he did not feel up to moving. Will re-attempt if time permits this pm or continue per POC next available date/time per POC. Pt awaiting transition to SNF.   Jannet Askew 09/30/2022, 2:42 PM

## 2022-10-01 DIAGNOSIS — R310 Gross hematuria: Secondary | ICD-10-CM | POA: Diagnosis not present

## 2022-10-01 LAB — HEMOGLOBIN AND HEMATOCRIT, BLOOD
HCT: 24 % — ABNORMAL LOW (ref 39.0–52.0)
Hemoglobin: 8.3 g/dL — ABNORMAL LOW (ref 13.0–17.0)

## 2022-10-01 LAB — GLUCOSE, CAPILLARY
Glucose-Capillary: 118 mg/dL — ABNORMAL HIGH (ref 70–99)
Glucose-Capillary: 97 mg/dL (ref 70–99)
Glucose-Capillary: 134 mg/dL — ABNORMAL HIGH (ref 70–99)
Glucose-Capillary: 121 mg/dL — ABNORMAL HIGH (ref 70–99)

## 2022-10-01 MED ORDER — CEPHALEXIN 500 MG PO CAPS: 500.0000 mg | ORAL_CAPSULE | Freq: Four times a day (QID) | ORAL | 0 refills | Status: AC

## 2022-10-01 MED ORDER — CEPHALEXIN 500 MG PO CAPS
500.0000 mg | ORAL_CAPSULE | Freq: Four times a day (QID) | ORAL | Status: DC
  Administered 2022-10-01 – 2022-10-05 (×19): 500 mg via ORAL
  Filled 2022-10-01 (×18): qty 1

## 2022-10-01 NOTE — Progress Notes (Signed)
Physical Therapy Treatment Patient Details Name: Jeffrey Porter MRN: [id number] DOB: 11-22-1931 Today's Date: 10/01/2022   History of Present Illness 89 y.o. male past medical history significant for urethral stricture urethral calculi bladder tumor status post cystoscopy with laser treated.  See final dilation of urethra secondary to stricture and transurethral resection of bladder tumor on 09/14/2022 discharged home with a Foley catheter since then has had gross hematuria suprapubic pain urology was consulted and saw the patient irrigated the patient and to small blood clots came out.  Urology recommended CT of the abdomen and pelvis and bladder irrigation as due to Foley many pretension he is at high risk of UTI.    PT Comments    Pt declined mobility, he stated he's "too weak". Pt agreed to bed level BUE/LE exercises. Pt remains appropriate for SNF.    Recommendations for follow up therapy are one component of a multi-disciplinary discharge planning process, led by the attending physician.  Recommendations may be updated based on patient status, additional functional criteria and insurance authorization.  Follow Up Recommendations  Can patient physically be transported by private vehicle: No    Assistance Recommended at Discharge Frequent or constant Supervision/Assistance  Patient can return home with the following A lot of help with walking and/or transfers;A lot of help with bathing/dressing/bathroom;Assistance with cooking/housework;Assist for transportation;Help with stairs or ramp for entrance   Equipment Recommendations  None recommended by PT    Recommendations for Other Services       Precautions / Restrictions Precautions Precautions: Fall Precaution Comments: pt denies falls in past 6 months Restrictions Weight Bearing Restrictions: No     Mobility  Bed Mobility               General bed mobility comments: pt refused, stated he's too weak    Transfers                         Ambulation/Gait                   Stairs             Wheelchair Mobility    Modified Rankin (Stroke Patients Only)       Balance                                            Cognition Arousal/Alertness: Awake/alert Behavior During Therapy: WFL for tasks assessed/performed Overall Cognitive Status: Within Functional Limits for tasks assessed                                          Exercises Total Joint Exercises Towel Squeeze: AROM, Both, 10 reps, Supine General Exercises - Upper Extremity Shoulder Extension: AROM, Both, 5 reps, Supine General Exercises - Lower Extremity Ankle Circles/Pumps: AROM, Both, 10 reps, Supine Short Arc Quad: AROM, Both, 10 reps, Supine Heel Slides: AAROM, Both, 10 reps, Supine Hip ABduction/ADduction: AROM, Both, 10 reps, Supine (with manual resistance, in hook lying) Other Exercises Other Exercises: passisve gastroc stretch x 3 B supine Other Exercises: passive hamstring stretch x 3 B supine    General Comments        Pertinent Vitals/Pain Pain Assessment Pain Assessment: Faces Faces Pain Scale: Hurts little more Pain Location: bladder  Pain Descriptors / Indicators: Pressure, Constant Pain Intervention(s): Limited activity within patient's tolerance, Monitored during session    Home Living                          Prior Function            PT Goals (current goals can now be found in the care plan section) Acute Rehab PT Goals Patient Stated Goal: to get stronger PT Goal Formulation: With patient Time For Goal Achievement: 10/12/22 Potential to Achieve Goals: Good Progress towards PT goals: Not progressing toward goals - comment (2* weakness)    Frequency    Min 1X/week      PT Plan Current plan remains appropriate    Co-evaluation              AM-PAC PT "6 Clicks" Mobility   Outcome Measure  Help needed turning from your  back to your side while in a flat bed without using bedrails?: Total Help needed moving from lying on your back to sitting on the side of a flat bed without using bedrails?: Total Help needed moving to and from a bed to a chair (including a wheelchair)?: Total Help needed standing up from a chair using your arms (e.g., wheelchair or bedside chair)?: Total Help needed to walk in hospital room?: Total Help needed climbing 3-5 steps with a railing? : Total 6 Click Score: 6    End of Session   Activity Tolerance: Patient limited by fatigue Patient left: in bed;with call bell/phone within reach;with bed alarm set Nurse Communication: Mobility status PT Visit Diagnosis: Difficulty in walking, not elsewhere classified (R26.2)     Time: 1610-9604 PT Time Calculation (min) (ACUTE ONLY): 15 min  Charges:  $Therapeutic Exercise: 8-22 mins                     Ralene Bathe Kistler PT 10/01/2022  Acute Rehabilitation Services  Office [phone]

## 2022-10-01 NOTE — TOC Progression Note (Signed)
Transition of Care [facility]) - Progression Note    Patient Details  Name: Jeffrey Godeaux MRN: [id number] Date of Birth: October 02, 1931  [facility]) CM/SW Contact  Adrian Prows, RN Phone Number: 10/01/2022, 12:24 PM  Clinical Narrative:    TOC contacted; pt's dtr [name] does not understand bed choices; explained facilities that offered beds are highlighted in yellow; she would like to know if [facility] can offer bed; she says bed rating are not good; explained ratings are subjective; she was encouraged to visit/call facilities; she says she will have her sister complete this; pt says she will also call other facilities that offered beds; contacted Whitney at [facility], and she says facility does not currently have beds, but some will come available next week; she requested initial documents be resent; initial report sent via SNF Hub; pt's dtr given update per Whitney; awaiting choice; ins auth needed.   Expected Discharge Plan: Assisted Living Barriers to Discharge: Continued Medical Work up  Expected Discharge Plan and Services   Discharge Planning Services: CM Consult Post Acute Care Choice: Resumption of Svcs/PTA Provider (return ALF possible HHPT facility uses legacy if needed.) Living arrangements for the past 2 months: Assisted Living Facility                                       Social Determinants of Health (SDOH) Interventions SDOH Screenings   Food Insecurity: No Food Insecurity (09/28/2022)  Housing: Low Risk  (09/28/2022)  Transportation Needs: No Transportation Needs (09/28/2022)  Utilities: Not At Risk (09/28/2022)  Tobacco Use: Medium Risk (09/30/2022)    Readmission Risk Interventions    09/28/2022    4:40 PM 09/28/2022   10:25 AM  Readmission Risk Prevention Plan  Transportation Screening Complete Complete  PCP or Specialist Appt within 3-5 Days  Complete  HRI or Home Care Consult Complete Complete  Social Work Consult for Recovery Care  Planning/Counseling  Complete  Palliative Care Screening  Complete  Medication Review [employer])  Complete

## 2022-10-01 NOTE — Discharge Instructions (Signed)
Transurethral Resection of Bladder Tumor (TURBT)   Definition:  Transurethral Resection of the Bladder Tumor is a surgical procedure used to diagnose and remove tumors within the bladder. TURBT is the most common treatment for early stage bladder cancer.  General instructions:     Your recent bladder surgery requires very little post hospital care but some definite precautions.  Despite the fact that no skin incisions were used, the area around the bladder incisions are raw and covered with scabs to promote healing and prevent bleeding. Certain precautions are needed to insure that the scabs are not disturbed over the next 2-4 weeks while the healing proceeds.  Because the raw surface inside your bladder and the irritating effects of urine you may expect frequency of urination and/or urgency (a stronger desire to urinate) and perhaps even getting up at night more often. This will usually resolve or improve slowly over the healing period. You may see some blood in your urine over the first 6 weeks. Do not be alarmed, even if the urine was clear for a while. Get off your feet and drink lots of fluids until clearing occurs. If you start to pass clots or don't improve call us.  Catheter: (If you are discharged with a catheter.)  1. Keep your catheter secured to your leg at all times with tape or the supplied strap. 2. You may experience leakage of urine around your catheter- as long as the  catheter continues to drain, this is normal.  If your catheter stops draining  go to the ER. 3. You may also have blood in your urine, even after it has been clear for  several days; you may even pass some small blood clots or other material.  This  is normal as well.  If this happens, sit down and drink plenty of water to help  make urine to flush out your bladder.  If the blood in your urine becomes worse  after doing this, contact our office or return to the ER. 4. You may use the leg bag (small bag)  during the day, but use the large bag at  night.  Diet:  You may return to your normal diet immediately. Because of the raw surface of your bladder, alcohol, spicy foods, foods high in acid and drinks with caffeine may cause irritation or frequency and should be used in moderation. To keep your urine flowing freely and avoid constipation, drink plenty of fluids during the day (8-10 glasses). Tip: Avoid cranberry juice because it is very acidic.  Activity:  Your physical activity doesn't need to be restricted. However, if you are very active, you may see some blood in the urine. We suggest that you reduce your activity under the circumstances until the bleeding has stopped.  Bowels:  It is important to keep your bowels regular during the postoperative period. Straining with bowel movements can cause bleeding. A bowel movement every other day is reasonable. Use a mild laxative if needed, such as milk of magnesia 2-3 tablespoons, or 2 Dulcolax tablets. Call if you continue to have problems. If you had been taking narcotics for pain, before, during or after your surgery, you may be constipated. Take a laxative if necessary.    Medication:  You should resume your pre-surgery medications unless told not to. In addition you may be given an antibiotic to prevent or treat infection. Antibiotics are not always necessary. All medication should be taken as prescribed until the bottles are finished unless you are having   an unusual reaction to one of the drugs.   Post stent placement instructions   Definitions:  Ureter: The duct that transports urine from the kidney to the bladder. Stent: A plastic hollow tube that is placed into the ureter, from the kidney to the bladder to prevent the ureter from swelling shut.  General instructions:  Despite the fact that no skin incisions were used, the area around the ureter and bladder is raw and irritated. The stent is a foreign body which can further  irritate the bladder wall. This irritation is manifested by increased frequency of urination, both day and night, and by an increase in the urge to urinate. In some, the urge to urinate is present almost always. Sometimes the urge is strong enough that you may not be able to stop your self from urinating. This can often be controlled with medication but does not occur in everyone. A stent can safely be left in place for 3 months or greater.  You may see some blood in your urine while the stent is in place and a few days afterward. Do not be alarmed, even if the urine is clear for a while. Get off your feet and drink lots of fluids until clearing occurs. If you start to pass clots or don't improve, call us.  Diet:  You may return to your normal diet immediately. Because of the raw surface of your bladder, alcohol, spicy foods, foods high in acid and drinks with caffeine may cause irritation or frequency and should be used in moderation. To keep your urine flowing freely and avoid constipation, drink plenty of fluids during the day (8-10 glasses). Tip: Avoid cranberry juice because it is very acidic.  Activity:  Your physical activity doesn't need to be restricted. However, if you are very active, you may see some blood in the urine. We suggest that you reduce your activity under the circumstances until the bleeding has stopped.  Bowels:  It is important to keep your bowels regular during the postoperative period. Straining with bowel movements can cause bleeding. A bowel movement every other day is reasonable. Use a mild laxative if needed, such as milk of magnesia 2-3 tablespoons, or 2 Dulcolax tablets. Call if you continue to have problems. If you had been taking narcotics for pain, before, during or after your surgery, you may be constipated. Take a laxative if necessary.  Medication:  You should resume your pre-surgery medications unless told not to. In addition you may be given an antibiotic to  prevent or treat infection. Antibiotics are not always necessary. All medication should be taken as prescribed until the bottles are finished unless you are having an unusual reaction to one of the drugs.  Problems you should report to us:  a. Fever greater than 101F. b. Heavy bleeding, or clots (see notes above about blood in urine). c. Inability to urinate. d. Drug reactions (hives, rash, nausea, vomiting, diarrhea). e. Severe burning or pain with urination that is not improving.   

## 2022-10-01 NOTE — Progress Notes (Signed)
Progress Note    Jeffrey Porter   YNW:[id number]  DOB: [birth year]  DOA: 09/27/2022     4 PCP: Sigmund Hazel, MD  Initial CC: hematuria  Hospital Course: Jeffrey Porter is an 89 y.o. male past medical history significant for urethral stricture urethral calculi bladder tumor status post cystoscopy with laser treated.  See final dilation of urethra secondary to stricture and transurethral resection of bladder tumor on 09/14/2022 discharged home with a Foley catheter since then has had gross hematuria suprapubic pain urology was consulted and saw the patient irrigated the patient and to small blood clots came out.  Urology recommended CT of the abdomen and pelvis and bladder irrigation as due to Foley many pretension he is at high risk of UTI.  Started on CBI   Interval History:  No events overnight.  Resting in bed comfortably when seen this morning.  Still some pink-tinged urine noted in Foley bag.  No pain complaints.  Assessment and Plan:  Recurrent gross hematuria: - s/p cystoscopy with lithotripsy, balloon dilation and urethral resection of bladder tumor on 09/14/2022 by Dr. Jenne Pane -Appreciate urology assistance with CBI; he also underwent cysto with R ureteral stent placement and fulguration of prior resection site on 6/5 - Hgb stable, 8.3 g/dL this am - still some pink tinged urine in foley -Rocephin transitioned to Keflex to complete course per urology   Complicated UTI (urinary tract infection): UA positive for pyuria urine culture has been sent. Blood cultures have been sent. - continue Rocephin    Hypovolemic hyponatremia: - improved with IVF   History of bladder tumor status post resection on 09/14/2022 -Prostate seed implants noted on CT.  No discrete abnormalities otherwise noted   Hyperglycemia -A1c 5.4% -Continue diet control  Old records reviewed in assessment of this patient  Antimicrobials: Rocephin 09/27/22 >> 10/01/2022 Keflex 10/01/2022 >> current  DVT prophylaxis:   SCDs Start: 09/27/22 1933   Code Status:   Code Status: Full Code  Mobility Assessment (last 72 hours)     Mobility Assessment     Row Name 10/01/22 1505 09/30/22 1030 09/29/22 0908 09/28/22 2300     Does patient have an order for bedrest or is patient medically unstable -- No - Continue assessment No - Continue assessment No - Continue assessment    What is the highest level of mobility based on the progressive mobility assessment? Level 1 (Bedfast) - Unable to balance while sitting on edge of bed Level 2 (Chairfast) - Balance while sitting on edge of bed and cannot stand Level 2 (Chairfast) - Balance while sitting on edge of bed and cannot stand Level 2 (Chairfast) - Balance while sitting on edge of bed and cannot stand    Is the above level different from baseline mobility prior to current illness? -- Yes - Recommend PT order Yes - Recommend PT order Yes - Recommend PT order              Objective: Blood pressure (!) 110/55, pulse 83, temperature 99 F (37.2 C), temperature source Oral, resp. rate 19, height 5\' 2"  (1.575 m), weight 58.9 kg, SpO2 98 %.  Examination:  Physical Exam Constitutional:      General: He is not in acute distress.    Appearance: Normal appearance.  HENT:     Head: Normocephalic and atraumatic.     Mouth/Throat:     Mouth: Mucous membranes are moist.  Eyes:     Extraocular Movements: Extraocular movements intact.  Cardiovascular:  Rate and Rhythm: Normal rate and regular rhythm.  Pulmonary:     Effort: Pulmonary effort is normal. No respiratory distress.     Breath sounds: Normal breath sounds. No wheezing.  Abdominal:     General: Bowel sounds are normal. There is no distension.     Palpations: Abdomen is soft.     Tenderness: There is no abdominal tenderness.  Genitourinary:    Comments: Foley in place with pink tinged urine noted in bag Musculoskeletal:        General: Normal range of motion.     Cervical back: Normal range of motion  and neck supple.  Skin:    General: Skin is warm and dry.  Neurological:     General: No focal deficit present.     Mental Status: He is alert.  Psychiatric:        Mood and Affect: Mood normal.        Behavior: Behavior normal.       Data Reviewed: Results for orders placed or performed during the hospital encounter of 09/27/22 (from the past 24 hour(s))  Glucose, capillary     Status: Abnormal   Collection Time: 09/30/22  4:40 PM  Result Value Ref Range   Glucose-Capillary 118 (H) 70 - 99 mg/dL  Glucose, capillary     Status: Abnormal   Collection Time: 09/30/22  9:56 PM  Result Value Ref Range   Glucose-Capillary 147 (H) 70 - 99 mg/dL  Hemoglobin and hematocrit, blood     Status: Abnormal   Collection Time: 10/01/22  5:25 AM  Result Value Ref Range   Hemoglobin 8.3 (L) 13.0 - 17.0 g/dL   HCT 86.5 (L) 78.4 - 69.6 %  Glucose, capillary     Status: None   Collection Time: 10/01/22  7:27 AM  Result Value Ref Range   Glucose-Capillary 97 70 - 99 mg/dL  Glucose, capillary     Status: Abnormal   Collection Time: 10/01/22 11:27 AM  Result Value Ref Range   Glucose-Capillary 134 (H) 70 - 99 mg/dL    I have reviewed pertinent nursing notes, vitals, labs, and images as necessary. I have ordered labwork to follow up on as indicated.  I have reviewed the last notes from staff over past 24 hours. I have discussed patient's care plan and test results with nursing staff, CM/SW, and other staff as appropriate.    LOS: 4 days   Lewie Chamber, MD [facility] 10/01/2022, 3:53 PM

## 2022-10-01 NOTE — Progress Notes (Addendum)
Urology Inpatient Progress Report  Hyponatremia [E87.1] Gross hematuria [R31.0] Hematuria, unspecified type [R31.9]  Procedure(s): CYSTOSCOPY WITH FULGARATION AND CLOT EVACUATION WITH STENT PLACEMENT  2 Days Post-Op   Intv/Subj: CBI clamped yesterday. No issues with foley overnight.  Principal Problem:   Gross hematuria Active Problems:   Complicated UTI (urinary tract infection)   Hyponatremia   Pressure injury of skin   Hyperglycemia   Bladder tumor  Current Facility-Administered Medications  Medication Dose Route Frequency Provider Last Rate Last Admin   acetaminophen (TYLENOL) tablet 650 mg  650 mg Oral Q6H PRN Dow Adolph N, DO       cefTRIAXone (ROCEPHIN) 2 g in sodium chloride 0.9 % 100 mL IVPB  2 g Intravenous Q24H Dow Adolph N, DO 200 mL/hr at 09/30/22 2007 2 g at 09/30/22 2007   Chlorhexidine Gluconate Cloth 2 % PADS 6 each  6 each Topical Daily Kasandra Knudsen D, MD   6 each at 09/30/22 1051   cholecalciferol (VITAMIN D3) 25 MCG (1000 UNIT) tablet 2,000 Units  2,000 Units Oral Daily Kasandra Knudsen D, MD   2,000 Units at 09/29/22 1850   cyanocobalamin (VITAMIN B12) tablet 1,000 mcg  1,000 mcg Oral Daily Dow Adolph N, DO   1,000 mcg at 09/30/22 0934   HYDROmorphone (DILAUDID) injection 0.5 mg  0.5 mg Intravenous Q4H PRN Dow Adolph N, DO   0.5 mg at 09/29/22 0321   latanoprost (XALATAN) 0.005 % ophthalmic solution 1 drop  1 drop Both Eyes QHS Kasandra Knudsen D, MD   1 drop at 09/30/22 2140   magnesium oxide (MAG-OX) tablet 400 mg  400 mg Oral Daily Kasandra Knudsen D, MD   400 mg at 09/29/22 1849   melatonin tablet 5 mg  5 mg Oral QHS PRN Dow Adolph N, DO   5 mg at 09/29/22 2121   multivitamin with minerals tablet 1 tablet  1 tablet Oral Q breakfast Kasandra Knudsen D, MD   1 tablet at 09/30/22 1610   niacin (VITAMIN B3) tablet 100 mg  100 mg Oral QHS Kasandra Knudsen D, MD       oxyCODONE (Oxy IR/ROXICODONE) immediate release tablet 5 mg  5 mg Oral Q6H PRN Dow Adolph N, DO   5 mg at 09/29/22 0431   polyethylene glycol (MIRALAX / GLYCOLAX) packet 17 g  17 g Oral Daily PRN Dow Adolph N, DO       prochlorperazine (COMPAZINE) injection 5 mg  5 mg Intravenous Q6H PRN Dow Adolph N, DO   5 mg at 09/27/22 2117   sodium chloride irrigation 0.9 % 3,000 mL  3,000 mL Irrigation Continuous Terald Sleeper, MD 0 mL/hr at 09/28/22 0519 3,000 mL at 09/29/22 0647   tamsulosin (FLOMAX) capsule 0.4 mg  0.4 mg Oral QHS Dow Adolph N, DO   0.4 mg at 09/28/22 2124   zinc sulfate capsule 220 mg  220 mg Oral Daily Kasandra Knudsen D, MD   220 mg at 09/29/22 1850     Objective: Vital: Vitals:   09/30/22 0420 09/30/22 1439 09/30/22 2034 10/01/22 0632  BP: (!) 141/51 (!) 127/55 (!) 117/43 (!) 112/44  Pulse: 84 (!) 101 (!) 103 81  Resp: 18 16 20 20   Temp: 99.1 F (37.3 C) 99 F (37.2 C) 98.9 F (37.2 C) 98.1 F (36.7 C)  TempSrc: Oral Oral Oral Oral  SpO2: 95% 97% 96% 95%  Weight:      Height:       I/Os: I/O last  3 completed shifts: In: 1358 [P.O.:1158; IV Piggyback:200] Out: 6500 [Urine:6500]  Physical Exam:  General: Patient is in no apparent distress Lungs: Normal respiratory effort, chest expands symmetrically. GI: abdomen soft and nontender Foley: 24Fr 3 way foley, CBI clamped, urine clear in tubing with scant blood  Ext: lower extremities symmetric  Lab Results: Recent Labs    09/29/22 0856 09/30/22 0459 10/01/22 0525  HGB 8.5* 8.3* 8.3*  HCT 25.4* 24.2* 24.0*   Recent Labs    09/28/22 0925 09/28/22 1817 09/29/22 0856  NA 129* 131* 134*  K 3.6 3.6 3.7  CL 99 100 100  CO2 21* 22 24  GLUCOSE 92 138* 119*  BUN 11 12 11   CREATININE 0.53* 0.75 0.73  CALCIUM 8.3* 8.0* 8.1*   No results for input(s): "LABPT", "INR" in the last 72 hours. No results for input(s): "LABURIN" in the last 72 hours. Results for orders placed or performed during the hospital encounter of 09/27/22  Urine Culture     Status: Abnormal   Collection Time:  09/27/22  3:28 PM   Specimen: Urine, Clean Catch  Result Value Ref Range Status   Specimen Description   Final    URINE, CLEAN CATCH Performed at [facility], [address] [place], [place] [address]    Special Requests   Final    NONE Performed at [facility], [address] [place], [place] [address]    Culture >=100,000 COLONIES/mL KLEBSIELLA PNEUMONIAE (A)  Final   Report Status 09/30/2022 FINAL  Final   Organism ID, Bacteria KLEBSIELLA PNEUMONIAE (A)  Final      Susceptibility   Klebsiella pneumoniae - MIC*    AMPICILLIN RESISTANT Resistant     CEFAZOLIN <=4 SENSITIVE Sensitive     CEFEPIME <=0.12 SENSITIVE Sensitive     CEFTRIAXONE <=0.25 SENSITIVE Sensitive     CIPROFLOXACIN <=0.25 SENSITIVE Sensitive     GENTAMICIN <=1 SENSITIVE Sensitive     IMIPENEM <=0.25 SENSITIVE Sensitive     NITROFURANTOIN 128 RESISTANT Resistant     TRIMETH/SULFA <=20 SENSITIVE Sensitive     AMPICILLIN/SULBACTAM 4 SENSITIVE Sensitive     PIP/TAZO <=4 SENSITIVE Sensitive     * >=100,000 COLONIES/mL KLEBSIELLA PNEUMONIAE  Culture, blood (Routine X 2) w Reflex to ID Panel     Status: None (Preliminary result)   Collection Time: 09/27/22  8:25 PM   Specimen: BLOOD RIGHT FOREARM  Result Value Ref Range Status   Specimen Description   Final    BLOOD RIGHT FOREARM Performed at [facility], [address] [place], [place] [address]    Special Requests   Final    BOTTLES DRAWN AEROBIC AND ANAEROBIC Blood Culture adequate volume Performed at [facility], [address], [place], [place] [address]    Culture   Final    NO GROWTH 3 DAYS Performed at [facility], [address], [place], [place] [address]    Report Status PENDING  Incomplete  Culture, blood (Routine X 2) w Reflex to ID Panel     Status: None (Preliminary result)   Collection Time: 09/27/22  8:35 PM   Specimen: BLOOD RIGHT HAND  Result Value Ref Range  Status   Specimen Description   Final    BLOOD RIGHT HAND Performed at [facility], [address] [place], [place] [address]    Special Requests   Final    BOTTLES DRAWN AEROBIC AND ANAEROBIC Blood Culture adequate  volume Performed at [facility], [address] [place], [place] [address]    Culture   Final    NO GROWTH 3 DAYS Performed at [facility], [address], [place], [place] [address]    Report Status PENDING  Incomplete  MRSA Next Gen by PCR, Nasal     Status: None   Collection Time: 09/28/22 12:05 PM   Specimen: Nasal Mucosa; Nasal Swab  Result Value Ref Range Status   MRSA by PCR Next Gen NOT DETECTED NOT DETECTED Final    Comment: (NOTE) The GeneXpert MRSA Assay (FDA approved for NASAL specimens only), is one component of a comprehensive MRSA colonization surveillance program. It is not intended to diagnose MRSA infection nor to guide or monitor treatment for MRSA infections. Test performance is not FDA approved in patients less than 42 years old. Performed at [facility], [address], [place], [place] [address]     Studies/Results: No results found.  Assessment: Procedure(s): CYSTOSCOPY WITH FULGARATION AND CLOT EVACUATION WITH STENT PLACEMENT, 2 Days Post-Op  doing well.  Plan: Gross heamturia: -CBI clamped yesterday, inflow port plugged this AM, continue foley until next week (right ureteral stent attached to foley) -Hgb stable  2. UTI: -transition to PO abx (ampicillin)  Patient waiting for PT eval/? SNF vs ALF with HHPT   Kasandra Knudsen, MD Urology 10/01/2022, 7:54 AM

## 2022-10-02 DIAGNOSIS — R310 Gross hematuria: Secondary | ICD-10-CM | POA: Diagnosis not present

## 2022-10-02 LAB — GLUCOSE, CAPILLARY
Glucose-Capillary: 110 mg/dL — ABNORMAL HIGH (ref 70–99)
Glucose-Capillary: 139 mg/dL — ABNORMAL HIGH (ref 70–99)
Glucose-Capillary: 100 mg/dL — ABNORMAL HIGH (ref 70–99)
Glucose-Capillary: 132 mg/dL — ABNORMAL HIGH (ref 70–99)

## 2022-10-02 LAB — HEMOGLOBIN AND HEMATOCRIT, BLOOD
HCT: 24.3 % — ABNORMAL LOW (ref 39.0–52.0)
Hemoglobin: 8.4 g/dL — ABNORMAL LOW (ref 13.0–17.0)

## 2022-10-02 LAB — CULTURE, BLOOD (ROUTINE X 2): Special Requests: ADEQUATE

## 2022-10-02 NOTE — Progress Notes (Signed)
3 Days Post-Op Subjective: Patient reports mild discomfort from foley. CBI stopped 6/6. Urine is dark brown. Patient having poor PO intake  Objective: Vital signs in last 24 hours: Temp:  [97.7 F (36.5 C)-99 F (37.2 C)] 97.7 F (36.5 C) (06/08 1226) Pulse Rate:  [72-86] 75 (06/08 1226) Resp:  [18-20] 18 (06/08 1226) BP: (103-131)/(47-55) 113/48 (06/08 1226) SpO2:  [96 %-98 %] 97 % (06/08 1226)  Intake/Output from previous day: 06/07 0701 - 06/08 0700 In: 360 [P.O.:360] Out: 1400 [Urine:1400] Intake/Output this shift: Total I/O In: 240 [P.O.:240] Out: 350 [Urine:350]  Physical Exam:  General:alert, cooperative, and appears stated age GI: soft, non tender, normal bowel sounds, no palpable masses, no organomegaly, no inguinal hernia Male genitalia: not done Extremities: extremities normal, atraumatic, no cyanosis or edema  Lab Results: Recent Labs    09/30/22 0459 10/01/22 0525 10/02/22 0554  HGB 8.3* 8.3* 8.4*  HCT 24.2* 24.0* 24.3*   BMET No results for input(s): "NA", "K", "CL", "CO2", "GLUCOSE", "BUN", "CREATININE", "CALCIUM" in the last 72 hours. No results for input(s): "LABPT", "INR" in the last 72 hours. No results for input(s): "LABURIN" in the last 72 hours. Results for orders placed or performed during the hospital encounter of 09/27/22  Urine Culture     Status: Abnormal   Collection Time: 09/27/22  3:28 PM   Specimen: Urine, Clean Catch  Result Value Ref Range Status   Specimen Description   Final    URINE, CLEAN CATCH Performed at [facility], [address] [place], [place] [address]    Special Requests   Final    NONE Performed at [facility], [address], [place], [place] [address]    Culture >=100,000 COLONIES/mL KLEBSIELLA PNEUMONIAE (A)  Final   Report Status 09/30/2022 FINAL  Final   Organism ID, Bacteria KLEBSIELLA PNEUMONIAE (A)  Final      Susceptibility   Klebsiella pneumoniae - MIC*     AMPICILLIN RESISTANT Resistant     CEFAZOLIN <=4 SENSITIVE Sensitive     CEFEPIME <=0.12 SENSITIVE Sensitive     CEFTRIAXONE <=0.25 SENSITIVE Sensitive     CIPROFLOXACIN <=0.25 SENSITIVE Sensitive     GENTAMICIN <=1 SENSITIVE Sensitive     IMIPENEM <=0.25 SENSITIVE Sensitive     NITROFURANTOIN 128 RESISTANT Resistant     TRIMETH/SULFA <=20 SENSITIVE Sensitive     AMPICILLIN/SULBACTAM 4 SENSITIVE Sensitive     PIP/TAZO <=4 SENSITIVE Sensitive     * >=100,000 COLONIES/mL KLEBSIELLA PNEUMONIAE  Culture, blood (Routine X 2) w Reflex to ID Panel     Status: None   Collection Time: 09/27/22  8:25 PM   Specimen: BLOOD RIGHT FOREARM  Result Value Ref Range Status   Specimen Description   Final    BLOOD RIGHT FOREARM Performed at [facility], [address] [place], [place] [address]    Special Requests   Final    BOTTLES DRAWN AEROBIC AND ANAEROBIC Blood Culture adequate volume Performed at [facility], [address] [place], [place] [address]    Culture   Final    NO GROWTH 5 DAYS Performed at [facility], [address] [place], [place] [address]    Report Status 10/02/2022 FINAL  Final  Culture, blood (Routine X 2) w Reflex to ID Panel     Status: None   Collection Time: 09/27/22  8:35 PM   Specimen: BLOOD RIGHT HAND  Result Value Ref Range Status  Specimen Description   Final    BLOOD RIGHT HAND Performed at [facility], [address] [place], [place] [address]    Special Requests   Final    BOTTLES DRAWN AEROBIC AND ANAEROBIC Blood Culture adequate volume Performed at [facility], [address] [place], [place] [address]    Culture   Final    NO GROWTH 5 DAYS Performed at [facility], [address], [place], [place] [address]    Report Status 10/02/2022 FINAL  Final  MRSA Next Gen by PCR, Nasal     Status: None   Collection Time: 09/28/22 12:05 PM   Specimen: Nasal Mucosa; Nasal Swab  Result  Value Ref Range Status   MRSA by PCR Next Gen NOT DETECTED NOT DETECTED Final    Comment: (NOTE) The GeneXpert MRSA Assay (FDA approved for NASAL specimens only), is one component of a comprehensive MRSA colonization surveillance program. It is not intended to diagnose MRSA infection nor to guide or monitor treatment for MRSA infections. Test performance is not FDA approved in patients less than 61 years old. Performed at [facility], [address], [place], [place] [address]     Studies/Results: No results found.  Assessment/Plan: Gross hematuria s/p cystoscopy, clot evac and fulgeration Patient instructed to increase fluid consumption. Likely d/c tomorrow   LOS: 5 days   Wilkie Aye 10/02/2022, 1:44 PM

## 2022-10-02 NOTE — Progress Notes (Signed)
Progress Note    Jeffrey Porter   ZOX:[id number]  DOB: 07/04/1931  DOA: 09/27/2022     5 PCP: Sigmund Hazel, MD  Initial CC: hematuria  Hospital Course: Jeffrey Porter is an 89 y.o. male past medical history significant for urethral stricture urethral calculi bladder tumor status post cystoscopy with laser treated.  See final dilation of urethra secondary to stricture and transurethral resection of bladder tumor on 09/14/2022 discharged home with a Foley catheter since then has had gross hematuria suprapubic pain urology was consulted and saw the patient irrigated the patient and to small blood clots came out.  Urology recommended CT of the abdomen and pelvis and bladder irrigation as due to Foley many pretension he is at high risk of UTI.  Started on CBI   Interval History:  No events overnight. Denies any complaints this am. Family present this am.   Assessment and Plan:  Recurrent gross hematuria: - s/p cystoscopy with lithotripsy, balloon dilation and urethral resection of bladder tumor on 09/14/2022 by Dr. Jenne Pane -Appreciate urology assistance with CBI; he also underwent cysto with R ureteral stent placement and fulguration of prior resection site on 6/5 - Hgb remaining stable, 8.4 g/dL this am - foley had been emptied prior to exam but has some dark red tinged urine in bag -Rocephin transitioned to Keflex to complete course per urology   Complicated UTI (urinary tract infection): UA positive for pyuria urine culture has been sent. Blood cultures have been sent. - continue Rocephin    Hypovolemic hyponatremia: - improved with IVF - last Na 134 on 6/5   History of bladder tumor status post resection on 09/14/2022 -Prostate seed implants noted on CT.  No discrete abnormalities otherwise noted   Hyperglycemia -A1c 5.4% -Continue diet control; CBGs are stable   Old records reviewed in assessment of this patient  Antimicrobials: Rocephin 09/27/22 >> 10/01/2022 Keflex 10/01/2022 >>  current  DVT prophylaxis:  SCDs Start: 09/27/22 1933   Code Status:   Code Status: Full Code  Mobility Assessment (last 72 hours)     Mobility Assessment     Row Name 10/02/22 1000 10/01/22 1933 10/01/22 1505 10/01/22 1030 09/30/22 1030   Does patient have an order for bedrest or is patient medically unstable No - Continue assessment No - Continue assessment -- No - Continue assessment No - Continue assessment   What is the highest level of mobility based on the progressive mobility assessment? Level 1 (Bedfast) - Unable to balance while sitting on edge of bed -- Level 1 (Bedfast) - Unable to balance while sitting on edge of bed Level 1 (Bedfast) - Unable to balance while sitting on edge of bed Level 2 (Chairfast) - Balance while sitting on edge of bed and cannot stand   Is the above level different from baseline mobility prior to current illness? Yes - Recommend PT order -- -- Yes - Recommend PT order Yes - Recommend PT order             Objective: Blood pressure (!) 113/48, pulse 75, temperature 97.7 F (36.5 C), temperature source Oral, resp. rate 18, height 5\' 2"  (1.575 m), weight 58.9 kg, SpO2 97 %.  Examination:  Physical Exam Constitutional:      General: He is not in acute distress.    Appearance: Normal appearance.  HENT:     Head: Normocephalic and atraumatic.     Mouth/Throat:     Mouth: Mucous membranes are moist.  Eyes:     Extraocular  Movements: Extraocular movements intact.  Cardiovascular:     Rate and Rhythm: Normal rate and regular rhythm.  Pulmonary:     Effort: Pulmonary effort is normal. No respiratory distress.     Breath sounds: Normal breath sounds. No wheezing.  Abdominal:     General: Bowel sounds are normal. There is no distension.     Palpations: Abdomen is soft.     Tenderness: There is no abdominal tenderness.  Genitourinary:    Comments: Foley in place with dark red tinged urine noted in bag Musculoskeletal:        General: Normal range  of motion.     Cervical back: Normal range of motion and neck supple.  Skin:    General: Skin is warm and dry.  Neurological:     General: No focal deficit present.     Mental Status: He is alert.  Psychiatric:        Mood and Affect: Mood normal.        Behavior: Behavior normal.       Data Reviewed: Results for orders placed or performed during the hospital encounter of 09/27/22 (from the past 24 hour(s))  Glucose, capillary     Status: Abnormal   Collection Time: 10/01/22  9:17 PM  Result Value Ref Range   Glucose-Capillary 121 (H) 70 - 99 mg/dL  Hemoglobin and hematocrit, blood     Status: Abnormal   Collection Time: 10/02/22  5:54 AM  Result Value Ref Range   Hemoglobin 8.4 (L) 13.0 - 17.0 g/dL   HCT 16.1 (L) 09.6 - 04.5 %  Glucose, capillary     Status: Abnormal   Collection Time: 10/02/22  7:29 AM  Result Value Ref Range   Glucose-Capillary 110 (H) 70 - 99 mg/dL  Glucose, capillary     Status: Abnormal   Collection Time: 10/02/22 11:46 AM  Result Value Ref Range   Glucose-Capillary 139 (H) 70 - 99 mg/dL    I have reviewed pertinent nursing notes, vitals, labs, and images as necessary. I have ordered labwork to follow up on as indicated.  I have reviewed the last notes from staff over past 24 hours. I have discussed patient's care plan and test results with nursing staff, CM/SW, and other staff as appropriate.    LOS: 5 days   Lewie Chamber, MD [facility] 10/02/2022, 2:50 PM

## 2022-10-03 DIAGNOSIS — R310 Gross hematuria: Secondary | ICD-10-CM | POA: Diagnosis not present

## 2022-10-03 DIAGNOSIS — D494 Neoplasm of unspecified behavior of bladder: Secondary | ICD-10-CM

## 2022-10-03 DIAGNOSIS — N390 Urinary tract infection, site not specified: Secondary | ICD-10-CM | POA: Diagnosis not present

## 2022-10-03 LAB — GLUCOSE, CAPILLARY
Glucose-Capillary: 95 mg/dL (ref 70–99)
Glucose-Capillary: 142 mg/dL — ABNORMAL HIGH (ref 70–99)
Glucose-Capillary: 129 mg/dL — ABNORMAL HIGH (ref 70–99)
Glucose-Capillary: 112 mg/dL — ABNORMAL HIGH (ref 70–99)

## 2022-10-03 LAB — HEMOGLOBIN AND HEMATOCRIT, BLOOD
HCT: 24.8 % — ABNORMAL LOW (ref 39.0–52.0)
Hemoglobin: 8.4 g/dL — ABNORMAL LOW (ref 13.0–17.0)

## 2022-10-03 NOTE — Progress Notes (Signed)
4 Days Post-Op Subjective: Patient reports mild discomfort from foley. CBI stopped 6/6. Urine is light pink  Objective: Vital signs in last 24 hours: Temp:  [98.1 F (36.7 C)-98.8 F (37.1 C)] 98.7 F (37.1 C) (06/09 1304) Pulse Rate:  [77-92] 80 (06/09 1304) Resp:  [16] 16 (06/09 1304) BP: (104-109)/(49-58) 106/56 (06/09 1304) SpO2:  [94 %-100 %] 100 % (06/09 1304)  Intake/Output from previous day: 06/08 0701 - 06/09 0700 In: 360 [P.O.:360] Out: 1600 [Urine:1600] Intake/Output this shift: No intake/output data recorded.  Physical Exam:  General:alert, cooperative, and appears stated age GI: soft, non tender, normal bowel sounds, no palpable masses, no organomegaly, no inguinal hernia Male genitalia: not done Extremities: extremities normal, atraumatic, no cyanosis or edema  Lab Results: Recent Labs    10/01/22 0525 10/02/22 0554 10/03/22 0535  HGB 8.3* 8.4* 8.4*  HCT 24.0* 24.3* 24.8*   BMET No results for input(s): "NA", "K", "CL", "CO2", "GLUCOSE", "BUN", "CREATININE", "CALCIUM" in the last 72 hours. No results for input(s): "LABPT", "INR" in the last 72 hours. No results for input(s): "LABURIN" in the last 72 hours. Results for orders placed or performed during the hospital encounter of 09/27/22  Urine Culture     Status: Abnormal   Collection Time: 09/27/22  3:28 PM   Specimen: Urine, Clean Catch  Result Value Ref Range Status   Specimen Description   Final    URINE, CLEAN CATCH Performed at [facility], [address] [place], [place] [address]    Special Requests   Final    NONE Performed at [facility], [address] [place], [place] [address]    Culture >=100,000 COLONIES/mL KLEBSIELLA PNEUMONIAE (A)  Final   Report Status 09/30/2022 FINAL  Final   Organism ID, Bacteria KLEBSIELLA PNEUMONIAE (A)  Final      Susceptibility   Klebsiella pneumoniae - MIC*    AMPICILLIN RESISTANT Resistant     CEFAZOLIN <=4  SENSITIVE Sensitive     CEFEPIME <=0.12 SENSITIVE Sensitive     CEFTRIAXONE <=0.25 SENSITIVE Sensitive     CIPROFLOXACIN <=0.25 SENSITIVE Sensitive     GENTAMICIN <=1 SENSITIVE Sensitive     IMIPENEM <=0.25 SENSITIVE Sensitive     NITROFURANTOIN 128 RESISTANT Resistant     TRIMETH/SULFA <=20 SENSITIVE Sensitive     AMPICILLIN/SULBACTAM 4 SENSITIVE Sensitive     PIP/TAZO <=4 SENSITIVE Sensitive     * >=100,000 COLONIES/mL KLEBSIELLA PNEUMONIAE  Culture, blood (Routine X 2) w Reflex to ID Panel     Status: None   Collection Time: 09/27/22  8:25 PM   Specimen: BLOOD RIGHT FOREARM  Result Value Ref Range Status   Specimen Description   Final    BLOOD RIGHT FOREARM Performed at [facility], [address] [place], [place] [address]    Special Requests   Final    BOTTLES DRAWN AEROBIC AND ANAEROBIC Blood Culture adequate volume Performed at [facility], [address], [place], [place] [address]    Culture   Final    NO GROWTH 5 DAYS Performed at [facility], [address], [place], [place] [address]    Report Status 10/02/2022 FINAL  Final  Culture, blood (Routine X 2) w Reflex to ID Panel     Status: None   Collection Time: 09/27/22  8:35 PM   Specimen: BLOOD RIGHT HAND  Result Value Ref Range Status   Specimen Description   Final  BLOOD RIGHT HAND Performed at [facility], [address] [place], [place] [address]    Special Requests   Final    BOTTLES DRAWN AEROBIC AND ANAEROBIC Blood Culture adequate volume Performed at [facility], [address] [place], [place] [address]    Culture   Final    NO GROWTH 5 DAYS Performed at [facility], [address], [place], [place] [address]    Report Status 10/02/2022 FINAL  Final  MRSA Next Gen by PCR, Nasal     Status: None   Collection Time: 09/28/22 12:05 PM   Specimen: Nasal Mucosa; Nasal Swab  Result Value Ref Range Status   MRSA by PCR Next Gen NOT  DETECTED NOT DETECTED Final    Comment: (NOTE) The GeneXpert MRSA Assay (FDA approved for NASAL specimens only), is one component of a comprehensive MRSA colonization surveillance program. It is not intended to diagnose MRSA infection nor to guide or monitor treatment for MRSA infections. Test performance is not FDA approved in patients less than 64 years old. Performed at [facility], [address], [place], [place] [address]     Studies/Results: No results found.  Assessment/Plan: Gross hematuria s/p cystoscopy, clot evac and fulgeration Patient instructed to increase fluid consumption. Patient can be discharged from a urology standpoint   LOS: 6 days   Wilkie Aye 10/03/2022, 4:56 PM

## 2022-10-03 NOTE — Progress Notes (Signed)
Progress Note    Jeffrey Porter   ZOX:[id number]  DOB: 11-02-1931  DOA: 09/27/2022     6 PCP: Sigmund Hazel, MD  Initial CC: hematuria  Hospital Course: Jeffrey Porter is an 89 y.o. male past medical history significant for urethral stricture urethral calculi bladder tumor status post cystoscopy with laser treated.  See final dilation of urethra secondary to stricture and transurethral resection of bladder tumor on 09/14/2022 discharged home with a Foley catheter since then has had gross hematuria suprapubic pain urology was consulted and saw the patient irrigated the patient and to small blood clots came out.  Urology recommended CT of the abdomen and pelvis and bladder irrigation as due to Foley many pretension he is at high risk of UTI.  Started on CBI   Interval History:  No events overnight. Hgb stable. Has no questions this am.   Assessment and Plan:  Recurrent gross hematuria: - s/p cystoscopy with lithotripsy, balloon dilation and urethral resection of bladder tumor on 09/14/2022 by Dr. Jenne Pane -Appreciate urology assistance with CBI; he also underwent cysto with R ureteral stent placement and fulguration of prior resection site on 6/5 - Hgb remaining stable, 8.4 g/dL this am -Rocephin transitioned to Keflex to complete course per urology   Complicated UTI (urinary tract infection): UA positive for pyuria urine culture has been sent. Blood cultures have been sent. - continue abx    Hypovolemic hyponatremia: - improved with IVF - last Na 134 on 6/5   History of bladder tumor status post resection on 09/14/2022 -Prostate seed implants noted on CT.  No discrete abnormalities otherwise noted   Hyperglycemia -A1c 5.4% -Continue diet control; CBGs are stable   Old records reviewed in assessment of this patient  Antimicrobials: Rocephin 09/27/22 >> 10/01/2022 Keflex 10/01/2022 >> current  DVT prophylaxis:  SCDs Start: 09/27/22 1933   Code Status:   Code Status: Full Code  Mobility  Assessment (last 72 hours)     Mobility Assessment     Row Name 10/03/22 1100 10/02/22 1943 10/02/22 1000 10/01/22 1933 10/01/22 1505   Does patient have an order for bedrest or is patient medically unstable No - Continue assessment No - Continue assessment No - Continue assessment No - Continue assessment --   What is the highest level of mobility based on the progressive mobility assessment? Level 1 (Bedfast) - Unable to balance while sitting on edge of bed -- Level 1 (Bedfast) - Unable to balance while sitting on edge of bed -- Level 1 (Bedfast) - Unable to balance while sitting on edge of bed   Is the above level different from baseline mobility prior to current illness? Yes - Recommend PT order -- Yes - Recommend PT order -- --    Row Name 10/01/22 1030           Does patient have an order for bedrest or is patient medically unstable No - Continue assessment       What is the highest level of mobility based on the progressive mobility assessment? Level 1 (Bedfast) - Unable to balance while sitting on edge of bed       Is the above level different from baseline mobility prior to current illness? Yes - Recommend PT order                 Objective: Blood pressure (!) 106/56, pulse 80, temperature 98.7 F (37.1 C), temperature source Oral, resp. rate 16, height 5\' 2"  (1.575 m), weight 58.9 kg, SpO2 100 %.  Examination:  Physical Exam Constitutional:      General: He is not in acute distress.    Appearance: Normal appearance.  HENT:     Head: Normocephalic and atraumatic.     Mouth/Throat:     Mouth: Mucous membranes are moist.  Eyes:     Extraocular Movements: Extraocular movements intact.  Cardiovascular:     Rate and Rhythm: Normal rate and regular rhythm.  Pulmonary:     Effort: Pulmonary effort is normal. No respiratory distress.     Breath sounds: Normal breath sounds. No wheezing.  Abdominal:     General: Bowel sounds are normal. There is no distension.      Palpations: Abdomen is soft.     Tenderness: There is no abdominal tenderness.  Genitourinary:    Comments: Foley in place with dark red tinged urine noted in bag Musculoskeletal:        General: Normal range of motion.     Cervical back: Normal range of motion and neck supple.  Skin:    General: Skin is warm and dry.  Neurological:     General: No focal deficit present.     Mental Status: He is alert.  Psychiatric:        Mood and Affect: Mood normal.        Behavior: Behavior normal.       Data Reviewed: Results for orders placed or performed during the hospital encounter of 09/27/22 (from the past 24 hour(s))  Glucose, capillary     Status: Abnormal   Collection Time: 10/02/22  5:11 PM  Result Value Ref Range   Glucose-Capillary 100 (H) 70 - 99 mg/dL  Glucose, capillary     Status: Abnormal   Collection Time: 10/02/22  8:34 PM  Result Value Ref Range   Glucose-Capillary 132 (H) 70 - 99 mg/dL  Hemoglobin and hematocrit, blood     Status: Abnormal   Collection Time: 10/03/22  5:35 AM  Result Value Ref Range   Hemoglobin 8.4 (L) 13.0 - 17.0 g/dL   HCT 16.1 (L) 09.6 - 04.5 %  Glucose, capillary     Status: None   Collection Time: 10/03/22  7:29 AM  Result Value Ref Range   Glucose-Capillary 95 70 - 99 mg/dL  Glucose, capillary     Status: Abnormal   Collection Time: 10/03/22 11:38 AM  Result Value Ref Range   Glucose-Capillary 142 (H) 70 - 99 mg/dL    I have reviewed pertinent nursing notes, vitals, labs, and images as necessary. I have ordered labwork to follow up on as indicated.  I have reviewed the last notes from staff over past 24 hours. I have discussed patient's care plan and test results with nursing staff, CM/SW, and other staff as appropriate.    LOS: 6 days   Lewie Chamber, MD [facility] 10/03/2022, 2:46 PM

## 2022-10-03 NOTE — TOC Progression Note (Signed)
Transition of Care [facility]) - Progression Note    Patient Details  Name: Jeffrey Porter MRN: [id number] Date of Birth: [birth year]  [facility]) CM/SW Contact  Adrian Prows, RN Phone Number: 10/03/2022, 3:07 PM  Clinical Narrative:    [name] w/ pt and dtr in room; they have chosen bed offer from [facility]; facility notified via SNF Hub; needs ins auth.   Expected Discharge Plan: Assisted Living Barriers to Discharge: Continued Medical Work up  Expected Discharge Plan and Services   Discharge Planning Services: CM Consult Post Acute Care Choice: Resumption of Svcs/PTA Provider (return ALF possible HHPT facility uses legacy if needed.) Living arrangements for the past 2 months: Assisted Living Facility Expected Discharge Date: 10/03/22                                     Social Determinants of Health (SDOH) Interventions SDOH Screenings   Food Insecurity: No Food Insecurity (09/28/2022)  Housing: Low Risk  (09/28/2022)  Transportation Needs: No Transportation Needs (09/28/2022)  Utilities: Not At Risk (09/28/2022)  Tobacco Use: Medium Risk (09/30/2022)    Readmission Risk Interventions    09/28/2022    4:40 PM 09/28/2022   10:25 AM  Readmission Risk Prevention Plan  Transportation Screening Complete Complete  PCP or Specialist Appt within 3-5 Days  Complete  HRI or Home Care Consult Complete Complete  Social Work Consult for Recovery Care Planning/Counseling  Complete  Palliative Care Screening  Complete  Medication Review Oceanographer)  Complete

## 2022-10-04 DIAGNOSIS — R310 Gross hematuria: Secondary | ICD-10-CM | POA: Diagnosis not present

## 2022-10-04 DIAGNOSIS — N390 Urinary tract infection, site not specified: Secondary | ICD-10-CM | POA: Diagnosis not present

## 2022-10-04 LAB — COMPREHENSIVE METABOLIC PANEL
ALT: 51 U/L — ABNORMAL HIGH (ref 0–44)
AST: 34 U/L (ref 15–41)
Albumin: 3.4 g/dL — ABNORMAL LOW (ref 3.5–5.0)
Alkaline Phosphatase: 63 U/L (ref 38–126)
Anion gap: 10 (ref 5–15)
BUN: 12 mg/dL (ref 8–23)
CO2: 24 mmol/L (ref 22–32)
Calcium: 8.8 mg/dL — ABNORMAL LOW (ref 8.9–10.3)
Chloride: 93 mmol/L — ABNORMAL LOW (ref 98–111)
Creatinine, Ser: 0.53 mg/dL — ABNORMAL LOW (ref 0.61–1.24)
GFR, Estimated: >60 mL/min (ref >60)
Glucose, Bld: 104 mg/dL — ABNORMAL HIGH (ref 70–99)
Potassium: 3.7 mmol/L (ref 3.5–5.1)
Sodium: 127 mmol/L — ABNORMAL LOW (ref 135–145)
Total Bilirubin: 0.6 mg/dL (ref 0.3–1.2)
Total Protein: 6.2 g/dL — ABNORMAL LOW (ref 6.5–8.1)

## 2022-10-04 LAB — GLUCOSE, CAPILLARY
Glucose-Capillary: 99 mg/dL (ref 70–99)
Glucose-Capillary: 92 mg/dL (ref 70–99)
Glucose-Capillary: 143 mg/dL — ABNORMAL HIGH (ref 70–99)
Glucose-Capillary: 134 mg/dL — ABNORMAL HIGH (ref 70–99)
Glucose-Capillary: 147 mg/dL — ABNORMAL HIGH (ref 70–99)

## 2022-10-04 LAB — CBC
HCT: 25.9 % — ABNORMAL LOW (ref 39.0–52.0)
Hemoglobin: 8.7 g/dL — ABNORMAL LOW (ref 13.0–17.0)
MCH: 29.1 pg (ref 26.0–34.0)
MCHC: 33.6 g/dL (ref 30.0–36.0)
MCV: 86.6 fL (ref 80.0–100.0)
Platelets: 455 10*3/uL — ABNORMAL HIGH (ref 150–400)
RBC: 2.99 MIL/uL — ABNORMAL LOW (ref 4.22–5.81)
RDW: 12.1 % (ref 11.5–15.5)
WBC: 7.9 10*3/uL (ref 4.0–10.5)
nRBC: 0 % (ref 0.0–0.2)

## 2022-10-04 LAB — MAGNESIUM: Magnesium: 2.1 mg/dL (ref 1.7–2.4)

## 2022-10-04 MED ORDER — LACTATED RINGERS IV SOLN: INTRAVENOUS | Status: DC

## 2022-10-04 NOTE — TOC Progression Note (Addendum)
Transition of Care [facility]) - Progression Note    Patient Details  Name: Jeffrey Porter MRN: [id number] Date of Birth: 1932-02-15  [facility]) CM/SW Contact  Cassady Stanczak [place], [place] Phone Number: 10/04/2022, 1:31 PM  Clinical Narrative:     CSW is informed pt is medically ready for DC. CSW contacted [facility] and is informed that pt can admit once insurance Berkley Harvey is approved.   CSW submitted insurance auth request in online portal. Auth status: pending  1503: CSW received call from insurance with questions regarding pt's medical stability as well as level of assistance he has at ALF. CSW answered questions and explained that attending has stated pt is medically stable for DC. Insurance rep explained she would need to send auth for [employer] for review. She will provide update once their medical director has reviewed authorization.   1611: Auth still pending at this time   Expected Discharge Plan: Assisted Living Barriers to Discharge: Continued Medical Work up  Expected Discharge Plan and Services   Discharge Planning Services: CM Consult Post Acute Care Choice: Resumption of Svcs/PTA Provider (return ALF possible HHPT facility uses legacy if needed.) Living arrangements for the past 2 months: Assisted Living Facility Expected Discharge Date: 10/03/22                                     Social Determinants of Health (SDOH) Interventions SDOH Screenings   Food Insecurity: No Food Insecurity (09/28/2022)  Housing: Low Risk  (09/28/2022)  Transportation Needs: No Transportation Needs (09/28/2022)  Utilities: Not At Risk (09/28/2022)  Tobacco Use: Medium Risk (09/30/2022)    Readmission Risk Interventions    09/28/2022    4:40 PM 09/28/2022   10:25 AM  Readmission Risk Prevention Plan  Transportation Screening Complete Complete  PCP or Specialist Appt within 3-5 Days  Complete  HRI or Home Care Consult Complete Complete  Social Work Consult for Recovery Care  Planning/Counseling  Complete  Palliative Care Screening  Complete  Medication Review Oceanographer)  Complete

## 2022-10-04 NOTE — Care Management Important Message (Signed)
Important Message  Patient Details IM Letter given Name: Jeffrey Porter MRN: [id number] Date of Birth: 17-Jan-1932   Medicare Important Message Given:  Yes     Caren Macadam 10/04/2022, 1:06 PM

## 2022-10-04 NOTE — Progress Notes (Signed)
Physical Therapy Treatment Patient Details Name: Jeffrey Porter MRN: [id number] DOB: 02/02/1932 Today's Date: 10/04/2022   History of Present Illness 89 y.o. male past medical history significant for urethral stricture urethral calculi bladder tumor status post cystoscopy with laser treated.  See final dilation of urethra secondary to stricture and transurethral resection of bladder tumor on 09/14/2022 discharged home with a Foley catheter since then has had gross hematuria suprapubic pain urology was consulted and saw the patient irrigated the patient and to small blood clots came out.  Urology recommended CT of the abdomen and pelvis and bladder irrigation as due to Foley many pretension he is at high risk of UTI.    PT Comments    Min assist to roll to right, mod assist sidelying to sit, pt sat edge of bed x 8 minutes and performed BUE/LE exercises as well and trunk/neck rotation AROM. Pt lacks full extension AROM of B knees, gentle hamstring stretch performed. Sitting tolerance was limited by fatigue. Pt stated he's too weak to attempt to stand.   Recommendations for follow up therapy are one component of a multi-disciplinary discharge planning process, led by the attending physician.  Recommendations may be updated based on patient status, additional functional criteria and insurance authorization.  Follow Up Recommendations       Assistance Recommended at Discharge Frequent or constant Supervision/Assistance  Patient can return home with the following A lot of help with walking and/or transfers;A lot of help with bathing/dressing/bathroom;Assistance with cooking/housework;Assist for transportation;Help with stairs or ramp for entrance   Equipment Recommendations  None recommended by PT    Recommendations for Other Services       Precautions / Restrictions Precautions Precautions: Fall Precaution Comments: pt denies falls in past 6 months Restrictions Weight Bearing Restrictions: No      Mobility  Bed Mobility   Bed Mobility: Rolling, Sidelying to Sit, Sit to Supine Rolling: Min assist Sidelying to sit: Mod assist   Sit to supine: Max assist   General bed mobility comments: VCs for log roll to R, min A to roll onto R side, Mod A to raise trunk and advance BLEs over edge of bed; pt sat up at edge of bed for 8 minutes    Transfers                   General transfer comment: deferred 2* fatigue and weakness    Ambulation/Gait                   Stairs             Wheelchair Mobility    Modified Rankin (Stroke Patients Only)       Balance Overall balance assessment: Needs assistance Sitting-balance support: Feet supported, Bilateral upper extremity supported Sitting balance-Leahy Scale: Poor Sitting balance - Comments: requires BUE/LE support to maintain trunk in neutral                                    Cognition Arousal/Alertness: Awake/alert Behavior During Therapy: WFL for tasks assessed/performed Overall Cognitive Status: Within Functional Limits for tasks assessed                                          Exercises General Exercises - Upper Extremity Shoulder Flexion: AROM, Both, 10 reps, Seated  General Exercises - Lower Extremity Ankle Circles/Pumps: AROM, Both, 10 reps, Supine, Seated Long Arc Quad: AAROM, Both, 10 reps, Seated Hip Flexion/Marching: AROM, Both, 5 reps, Seated Other Exercises Other Exercises: seated trunk and neck rotation AROM to L and R x 3 each    General Comments        Pertinent Vitals/Pain Pain Assessment Faces Pain Scale: Hurts little more Pain Location: discomfort at Foley insertion site with movement Pain Descriptors / Indicators: Discomfort Pain Intervention(s): Limited activity within patient's tolerance, Monitored during session, Repositioned    Home Living                          Prior Function            PT Goals (current  goals can now be found in the care plan section) Acute Rehab PT Goals Patient Stated Goal: to get stronger PT Goal Formulation: With patient Time For Goal Achievement: 10/12/22 Potential to Achieve Goals: Good Progress towards PT goals: Progressing toward goals    Frequency    Min 1X/week      PT Plan Current plan remains appropriate    Co-evaluation              AM-PAC PT "6 Clicks" Mobility   Outcome Measure  Help needed turning from your back to your side while in a flat bed without using bedrails?: A Little Help needed moving from lying on your back to sitting on the side of a flat bed without using bedrails?: A Lot Help needed moving to and from a bed to a chair (including a wheelchair)?: Total Help needed standing up from a chair using your arms (e.g., wheelchair or bedside chair)?: Total Help needed to walk in hospital room?: Total Help needed climbing 3-5 steps with a railing? : Total 6 Click Score: 9    End of Session   Activity Tolerance: Patient limited by fatigue Patient left: in bed;with call bell/phone within reach;with bed alarm set Nurse Communication: Mobility status PT Visit Diagnosis: Difficulty in walking, not elsewhere classified (R26.2);Muscle weakness (generalized) (M62.81)     Time: 1610-9604 PT Time Calculation (min) (ACUTE ONLY): 18 min  Charges:  $Therapeutic Activity: 8-22 mins                     Ralene Bathe Kistler PT 10/04/2022  Acute Rehabilitation Services  Office [phone]

## 2022-10-04 NOTE — Progress Notes (Signed)
Urology Inpatient Progress Report  Hyponatremia [E87.1] Gross hematuria [R31.0] Hematuria, unspecified type [R31.9]  Procedure(s): CYSTOSCOPY WITH FULGARATION AND CLOT EVACUATION WITH STENT PLACEMENT  5 Days Post-Op   Intv/Subj: Stent was inadvertently dislodged last night.  Foley in place. Patient stated he felt like he was dying early this morning. Vitals were checked and stable. Labs and ECG also checked and wnl.  Principal Problem:   Gross hematuria Active Problems:   Complicated UTI (urinary tract infection)   Hyponatremia   Pressure injury of skin   Hyperglycemia   Bladder tumor  Current Facility-Administered Medications  Medication Dose Route Frequency Provider Last Rate Last Admin   acetaminophen (TYLENOL) tablet 650 mg  650 mg Oral Q6H PRN Dow Adolph N, DO   650 mg at 10/01/22 1926   cephALEXin (KEFLEX) capsule 500 mg  500 mg Oral Q6H Kasandra Knudsen D, MD   500 mg at 10/04/22 8657   Chlorhexidine Gluconate Cloth 2 % PADS 6 each  6 each Topical Daily Noel Christmas, MD   6 each at 10/03/22 0914   cholecalciferol (VITAMIN D3) 25 MCG (1000 UNIT) tablet 2,000 Units  2,000 Units Oral Daily Kasandra Knudsen D, MD   2,000 Units at 10/03/22 1038   cyanocobalamin (VITAMIN B12) tablet 1,000 mcg  1,000 mcg Oral Daily Dow Adolph N, DO   1,000 mcg at 10/03/22 1038   HYDROmorphone (DILAUDID) injection 0.5 mg  0.5 mg Intravenous Q4H PRN Dow Adolph N, DO   0.5 mg at 09/29/22 0321   latanoprost (XALATAN) 0.005 % ophthalmic solution 1 drop  1 drop Both Eyes QHS Kasandra Knudsen D, MD   1 drop at 10/03/22 2157   magnesium oxide (MAG-OX) tablet 400 mg  400 mg Oral Daily Kasandra Knudsen D, MD   400 mg at 10/02/22 0857   melatonin tablet 5 mg  5 mg Oral QHS PRN Dow Adolph N, DO   5 mg at 10/03/22 2156   multivitamin with minerals tablet 1 tablet  1 tablet Oral Q breakfast Kasandra Knudsen D, MD   1 tablet at 09/30/22 8469   niacin (VITAMIN B3) tablet 100 mg  100 mg Oral QHS Kasandra Knudsen D, MD   100 mg at 10/03/22 2156   oxyCODONE (Oxy IR/ROXICODONE) immediate release tablet 5 mg  5 mg Oral Q6H PRN Dow Adolph N, DO   5 mg at 09/29/22 0431   polyethylene glycol (MIRALAX / GLYCOLAX) packet 17 g  17 g Oral Daily PRN Dow Adolph N, DO   17 g at 10/01/22 1736   prochlorperazine (COMPAZINE) injection 5 mg  5 mg Intravenous Q6H PRN Dow Adolph N, DO   5 mg at 09/27/22 2117   sodium chloride irrigation 0.9 % 3,000 mL  3,000 mL Irrigation Continuous Terald Sleeper, MD 0 mL/hr at 09/28/22 0519 3,000 mL at 09/29/22 0647   tamsulosin (FLOMAX) capsule 0.4 mg  0.4 mg Oral QHS Dow Adolph N, DO   0.4 mg at 10/03/22 2155   zinc sulfate capsule 220 mg  220 mg Oral Daily Kasandra Knudsen D, MD   220 mg at 09/29/22 1850     Objective: Vital: Vitals:   10/03/22 1304 10/03/22 2126 10/04/22 0411 10/04/22 0419  BP: (!) 106/56 (!) 116/47 (!) 139/53 (!) 111/50  Pulse: 80 79 81 78  Resp: 16 16 20    Temp: 98.7 F (37.1 C) 98.4 F (36.9 C) 97.7 F (36.5 C)   TempSrc: Oral Oral Oral   SpO2: 100% 99%  94% 97%  Weight:      Height:       I/Os: I/O last 3 completed shifts: In: -  Out: 2300 [Urine:2300]  Physical Exam:  General: Patient is in no apparent distress Lungs: Normal respiratory effort, chest expands symmetrically. GI: The abdomen is soft and nontender Foley: 24Fr 3 way foley draining yellow concentrated urine  Ext: lower extremities symmetric  Lab Results: Recent Labs    10/02/22 0554 10/03/22 0535 10/04/22 0512  WBC  --   --  7.9  HGB 8.4* 8.4* 8.7*  HCT 24.3* 24.8* 25.9*   Recent Labs    10/04/22 0512  NA 127*  K 3.7  CL 93*  CO2 24  GLUCOSE 104*  BUN 12  CREATININE 0.53*  CALCIUM 8.8*   No results for input(s): "LABPT", "INR" in the last 72 hours. No results for input(s): "LABURIN" in the last 72 hours. Results for orders placed or performed during the hospital encounter of 09/27/22  Urine Culture     Status: Abnormal   Collection Time:  09/27/22  3:28 PM   Specimen: Urine, Clean Catch  Result Value Ref Range Status   Specimen Description   Final    URINE, CLEAN CATCH Performed at [facility], [address], [place], [place] [address]    Special Requests   Final    NONE Performed at [facility], [address] [place], [place] [address]    Culture >=100,000 COLONIES/mL KLEBSIELLA PNEUMONIAE (A)  Final   Report Status 09/30/2022 FINAL  Final   Organism ID, Bacteria KLEBSIELLA PNEUMONIAE (A)  Final      Susceptibility   Klebsiella pneumoniae - MIC*    AMPICILLIN RESISTANT Resistant     CEFAZOLIN <=4 SENSITIVE Sensitive     CEFEPIME <=0.12 SENSITIVE Sensitive     CEFTRIAXONE <=0.25 SENSITIVE Sensitive     CIPROFLOXACIN <=0.25 SENSITIVE Sensitive     GENTAMICIN <=1 SENSITIVE Sensitive     IMIPENEM <=0.25 SENSITIVE Sensitive     NITROFURANTOIN 128 RESISTANT Resistant     TRIMETH/SULFA <=20 SENSITIVE Sensitive     AMPICILLIN/SULBACTAM 4 SENSITIVE Sensitive     PIP/TAZO <=4 SENSITIVE Sensitive     * >=100,000 COLONIES/mL KLEBSIELLA PNEUMONIAE  Culture, blood (Routine X 2) w Reflex to ID Panel     Status: None   Collection Time: 09/27/22  8:25 PM   Specimen: BLOOD RIGHT FOREARM  Result Value Ref Range Status   Specimen Description   Final    BLOOD RIGHT FOREARM Performed at [facility], [address] [place], [place] [address]    Special Requests   Final    BOTTLES DRAWN AEROBIC AND ANAEROBIC Blood Culture adequate volume Performed at [facility], [address], [place], [place] [address]    Culture   Final    NO GROWTH 5 DAYS Performed at [facility], [address] [place], [place] [address]    Report Status 10/02/2022 FINAL  Final  Culture, blood (Routine X 2) w Reflex to ID Panel     Status: None   Collection Time: 09/27/22  8:35 PM   Specimen: BLOOD RIGHT HAND  Result Value Ref Range Status   Specimen Description   Final     BLOOD RIGHT HAND Performed at [facility], [address] [place], [place] [address]    Special Requests   Final    BOTTLES DRAWN AEROBIC AND ANAEROBIC Blood Culture adequate  volume Performed at [facility], [address], [place], [place] [address]    Culture   Final    NO GROWTH 5 DAYS Performed at [facility], [address], [place], [place] [address]    Report Status 10/02/2022 FINAL  Final  MRSA Next Gen by PCR, Nasal     Status: None   Collection Time: 09/28/22 12:05 PM   Specimen: Nasal Mucosa; Nasal Swab  Result Value Ref Range Status   MRSA by PCR Next Gen NOT DETECTED NOT DETECTED Final    Comment: (NOTE) The GeneXpert MRSA Assay (FDA approved for NASAL specimens only), is one component of a comprehensive MRSA colonization surveillance program. It is not intended to diagnose MRSA infection nor to guide or monitor treatment for MRSA infections. Test performance is not FDA approved in patients less than 71 years old. Performed at [facility], [address] [place], [place] [address]     Studies/Results: No results found.  Assessment: Procedure(s): CYSTOSCOPY WITH FULGARATION AND CLOT EVACUATION WITH STENT PLACEMENT, 5 Days Post-Op  doing well.  Plan: Gross hematuria: -has resolved s/p clot evacuation and Hgb stable -stent has been dislodged but foley in place -plan to discharge to SNF today -void trial in office on Friday -patient has 3 more days of antibiotics PO    Kasandra Knudsen, MD Urology 10/04/2022, 8:49 AM

## 2022-10-04 NOTE — Progress Notes (Signed)
Progress Note    Jeffrey Porter   ZOX:[id number]  DOB: Sep 10, 1931  DOA: 09/27/2022     7 PCP: Sigmund Hazel, MD  Initial CC: hematuria  Hospital Course: Jeffrey Porter is an 89 y.o. male past medical history significant for urethral stricture urethral calculi bladder tumor status post cystoscopy with laser treated.  See final dilation of urethra secondary to stricture and transurethral resection of bladder tumor on 09/14/2022 discharged home with a Foley catheter since then has had gross hematuria suprapubic pain urology was consulted and saw the patient irrigated the patient and to small blood clots came out.  Urology recommended CT of the abdomen and pelvis and bladder irrigation as due to Foley many pretension he is at high risk of UTI.  Started on CBI which was then stopped and foley able to drain adequately.   Interval History:  Odd event overnight where patient told nurse he "felt like he was dying".  Had apparently a blank stare and not responsive to nurses voice.  Sounds like this was short-lived and upon vitals being obtained and CBG he was responding and telling staff to leave him alone. Maybe caused when stent was dislodged?  When seen today he's in his normal state with no questions/concerns.   Assessment and Plan:  Recurrent gross hematuria - s/p cystoscopy with lithotripsy, balloon dilation and urethral resection of bladder tumor on 09/14/2022 by Dr. Jenne Pane -Appreciate urology assistance with CBI; he also underwent cysto with R ureteral stent placement and fulguration of prior resection site on 6/5 - Hgb remaining stable, 8.7 g/dL this am -Rocephin transitioned to Keflex to complete course per urology -Per urology, stent became dislodged last night but Foley in place.  Plan is for follow-up with urology outpatient for voiding trial on Friday   Complicated UTI (urinary tract infection): UA positive for pyuria urine culture has been sent. Blood cultures have been sent. - continue abx     Hypovolemic hyponatremia - improved with IVF - noted that Na down to 127 this morning; I don't feel last night events related to Na level and more coincidental - would keep encouraging hydration and oral intake - until he gets placed in SNF, I will start some IVF given net negative 24L currently    History of bladder tumor status post resection on 09/14/2022 -Prostate seed implants noted on CT.  No discrete abnormalities otherwise noted   Hyperglycemia -A1c 5.4% -Continue diet control; CBGs are stable   Old records reviewed in assessment of this patient  Antimicrobials: Rocephin 09/27/22 >> 10/01/2022 Keflex 10/01/2022 >> current  DVT prophylaxis:  SCDs Start: 09/27/22 1933   Code Status:   Code Status: Full Code  Mobility Assessment (last 72 hours)     Mobility Assessment     Row Name 10/04/22 1128 10/03/22 1100 10/02/22 1943 10/02/22 1000 10/01/22 1933   Does patient have an order for bedrest or is patient medically unstable -- No - Continue assessment No - Continue assessment No - Continue assessment No - Continue assessment   What is the highest level of mobility based on the progressive mobility assessment? Level 2 (Chairfast) - Balance while sitting on edge of bed and cannot stand Level 1 (Bedfast) - Unable to balance while sitting on edge of bed -- Level 1 (Bedfast) - Unable to balance while sitting on edge of bed --   Is the above level different from baseline mobility prior to current illness? -- Yes - Recommend PT order -- Yes - Recommend PT order --  Objective: Blood pressure (!) 102/53, pulse 80, temperature 98.1 F (36.7 C), temperature source Oral, resp. rate 20, height 5\' 2"  (1.575 m), weight 58.9 kg, SpO2 99 %.  Examination:  Physical Exam Constitutional:      General: He is not in acute distress.    Appearance: Normal appearance.  HENT:     Head: Normocephalic and atraumatic.     Mouth/Throat:     Mouth: Mucous membranes are moist.  Eyes:      Extraocular Movements: Extraocular movements intact.  Cardiovascular:     Rate and Rhythm: Normal rate and regular rhythm.  Pulmonary:     Effort: Pulmonary effort is normal. No respiratory distress.     Breath sounds: Normal breath sounds. No wheezing.  Abdominal:     General: Bowel sounds are normal. There is no distension.     Palpations: Abdomen is soft.     Tenderness: There is no abdominal tenderness.  Genitourinary:    Comments: Foley in place with dark red tinged urine noted in bag Musculoskeletal:        General: Normal range of motion.     Cervical back: Normal range of motion and neck supple.  Skin:    General: Skin is warm and dry.  Neurological:     General: No focal deficit present.     Mental Status: He is alert.  Psychiatric:        Mood and Affect: Mood normal.        Behavior: Behavior normal.       Data Reviewed: Results for orders placed or performed during the hospital encounter of 09/27/22 (from the past 24 hour(s))  Glucose, capillary     Status: Abnormal   Collection Time: 10/03/22  4:23 PM  Result Value Ref Range   Glucose-Capillary 129 (H) 70 - 99 mg/dL  Glucose, capillary     Status: Abnormal   Collection Time: 10/03/22  9:23 PM  Result Value Ref Range   Glucose-Capillary 112 (H) 70 - 99 mg/dL  Glucose, capillary     Status: None   Collection Time: 10/04/22  4:16 AM  Result Value Ref Range   Glucose-Capillary 99 70 - 99 mg/dL  Comprehensive metabolic panel     Status: Abnormal   Collection Time: 10/04/22  5:12 AM  Result Value Ref Range   Sodium 127 (L) 135 - 145 mmol/L   Potassium 3.7 3.5 - 5.1 mmol/L   Chloride 93 (L) 98 - 111 mmol/L   CO2 24 22 - 32 mmol/L   Glucose, Bld 104 (H) 70 - 99 mg/dL   BUN 12 8 - 23 mg/dL   Creatinine, Ser 2.59 (L) 0.61 - 1.24 mg/dL   Calcium 8.8 (L) 8.9 - 10.3 mg/dL   Total Protein 6.2 (L) 6.5 - 8.1 g/dL   Albumin 3.4 (L) 3.5 - 5.0 g/dL   AST 34 15 - 41 U/L   ALT 51 (H) 0 - 44 U/L   Alkaline  Phosphatase 63 38 - 126 U/L   Total Bilirubin 0.6 0.3 - 1.2 mg/dL   GFR, Estimated >56 >38 mL/min   Anion gap 10 5 - 15  Magnesium     Status: None   Collection Time: 10/04/22  5:12 AM  Result Value Ref Range   Magnesium 2.1 1.7 - 2.4 mg/dL  CBC     Status: Abnormal   Collection Time: 10/04/22  5:12 AM  Result Value Ref Range   WBC 7.9 4.0 - 10.5 K/uL  RBC 2.99 (L) 4.22 - 5.81 MIL/uL   Hemoglobin 8.7 (L) 13.0 - 17.0 g/dL   HCT 16.1 (L) 09.6 - 04.5 %   MCV 86.6 80.0 - 100.0 fL   MCH 29.1 26.0 - 34.0 pg   MCHC 33.6 30.0 - 36.0 g/dL   RDW 40.9 81.1 - 91.4 %   Platelets 455 (H) 150 - 400 K/uL   nRBC 0.0 0.0 - 0.2 %  Glucose, capillary     Status: None   Collection Time: 10/04/22  7:40 AM  Result Value Ref Range   Glucose-Capillary 92 70 - 99 mg/dL  Glucose, capillary     Status: Abnormal   Collection Time: 10/04/22 12:05 PM  Result Value Ref Range   Glucose-Capillary 143 (H) 70 - 99 mg/dL    I have reviewed pertinent nursing notes, vitals, labs, and images as necessary. I have ordered labwork to follow up on as indicated.  I have reviewed the last notes from staff over past 24 hours. I have discussed patient's care plan and test results with nursing staff, CM/SW, and other staff as appropriate.    LOS: 7 days   Lewie Chamber, MD [facility] 10/04/2022, 4:04 PM

## 2022-10-04 NOTE — Progress Notes (Addendum)
Around [phone] Patient stated he felt like he was dying and wanted me to check his vitals.  Checked vitals and they were stable-Shortly after patient had a blank stare and was not responsive to my voice although eyes were still open.  Charge nurse came to bedside. Rapid nurse and Garner Nash NP paged-Lab orders placed. Vitals taken again, EKG Done-NSR, BG done: 99. Patient then started to respond and told us to leave him alone. Bed alarm on, call bell within reach.

## 2022-10-05 DIAGNOSIS — N390 Urinary tract infection, site not specified: Secondary | ICD-10-CM | POA: Diagnosis not present

## 2022-10-05 DIAGNOSIS — R310 Gross hematuria: Secondary | ICD-10-CM | POA: Diagnosis not present

## 2022-10-05 DIAGNOSIS — E871 Hypo-osmolality and hyponatremia: Secondary | ICD-10-CM | POA: Diagnosis not present

## 2022-10-05 LAB — BASIC METABOLIC PANEL
Anion gap: 8 (ref 5–15)
BUN: 12 mg/dL (ref 8–23)
CO2: 25 mmol/L (ref 22–32)
Calcium: 8.4 mg/dL — ABNORMAL LOW (ref 8.9–10.3)
Chloride: 96 mmol/L — ABNORMAL LOW (ref 98–111)
Creatinine, Ser: 0.42 mg/dL — ABNORMAL LOW (ref 0.61–1.24)
GFR, Estimated: >60 mL/min (ref >60)
Glucose, Bld: 104 mg/dL — ABNORMAL HIGH (ref 70–99)
Potassium: 3.6 mmol/L (ref 3.5–5.1)
Sodium: 129 mmol/L — ABNORMAL LOW (ref 135–145)

## 2022-10-05 LAB — GLUCOSE, CAPILLARY
Glucose-Capillary: 98 mg/dL (ref 70–99)
Glucose-Capillary: 125 mg/dL — ABNORMAL HIGH (ref 70–99)
Glucose-Capillary: 93 mg/dL (ref 70–99)
Glucose-Capillary: 162 mg/dL — ABNORMAL HIGH (ref 70–99)
Glucose-Capillary: 114 mg/dL — ABNORMAL HIGH (ref 70–99)

## 2022-10-05 LAB — HEMOGLOBIN AND HEMATOCRIT, BLOOD
HCT: 23 % — ABNORMAL LOW (ref 39.0–52.0)
Hemoglobin: 7.7 g/dL — ABNORMAL LOW (ref 13.0–17.0)

## 2022-10-05 NOTE — Discharge Summary (Addendum)
Physician Discharge Summary   Jeffrey Porter ZOX:[id number] DOB: 1931/06/12 DOA: 09/27/2022  PCP: Sigmund Hazel, MD  Admit date: 09/27/2022 Discharge date: 10/05/2022   Admitted From: ALF Disposition:  [facility] Discharging physician: Lewie Chamber, MD Barriers to discharge: placement  Recommendations at discharge: Follow up with urology; patient did void after foley removal on 6/11 Repeat Hgb at follow up Repeat BMP to check Na  Home Health: PT/OT  Discharge Condition: stable CODE STATUS: Full Diet recommendation:  Diet Orders (From admission, onward)     Start     Ordered   09/29/22 1639  Diet regular Room service appropriate? Yes; Fluid consistency: Thin  Diet effective now       Question Answer Comment  Room service appropriate? Yes   Fluid consistency: Thin      09/29/22 1638            Hospital Course: Jeffrey Porter is an 89 y.o. male past medical history significant for urethral stricture urethral calculi bladder tumor status post cystoscopy with laser treated.  Ttransurethral resection of bladder tumor on 09/14/2022 discharged home with a Foley catheter. Since then had gross hematuria, suprapubic pain; urology was consulted.  Patient underwent imaging with CT during hospitalization along with treatment with CBI.  This was able to be tapered off and urine remained pink-tinged with stable hemoglobin.  His Foley catheter was removed on 10/05/2022 and he was able to adequately void per nursing after Foley removal. Prior to discharge, he had been restarted back on fluids due to some hyponatremia which caused some hemodilution of hemoglobin.  This was not felt to represent any significant amount of bleeding, rather hemodilution.  Assessment and Plan:  Recurrent gross hematuria - s/p cystoscopy with lithotripsy, balloon dilation and urethral resection of bladder tumor on 09/14/2022 by Dr. Jenne Pane -Appreciate urology assistance with CBI; he also underwent cysto with R ureteral  stent placement and fulguration of prior resection site on 6/5 - Hgb had been stable for several days after treatments and stopping CBI etc - there was change in Hgb from 8.7 g/dL to 7.7 g/dL overnight from 8/11 to 6/11 but he had been started on IVF in setting of hypoNa and the Hgb change was felt to be dilutional; there as no change in color of urine (tea colored) and his hemodynamics were stable - he voided well s/p foley removal and will follow up outpatient with urology as well   Complicated UTI (urinary tract infection): UA positive for pyuria urine culture has been sent. Blood cultures have been sent. - continue abx to complete at discharge    Hypovolemic hyponatremia - improved with IVF - noted that Na down to 127 on 6/10 and was started on IVF - Na improved to 129 at discharge and he was asymptomatic   History of bladder tumor status post resection on 09/14/2022 -Prostate seed implants noted on CT.  No discrete abnormalities otherwise noted   Hyperglycemia -A1c 5.4% -Continue diet control; CBGs stable    Principal Diagnosis: Gross hematuria  Discharge Diagnoses: Active Hospital Problems   Diagnosis Date Noted   Gross hematuria 09/23/2022   Pressure injury of skin 09/28/2022   Hyperglycemia 09/28/2022   Bladder tumor 09/28/2022   Hyponatremia 12/22/2017   Complicated UTI (urinary tract infection) 12/06/2017    Resolved Hospital Problems  No resolved problems to display.      Allergies as of 10/05/2022       Reactions   Timolol Maleate Other (See Comments)   Syncope  Medication List     TAKE these medications    acetaminophen 500 MG tablet Commonly known as: TYLENOL Take 500 mg by mouth daily as needed for mild pain or headache.   BIOTIN PO Take 1 tablet by mouth daily. Patient not taking   Carbidopa-Levodopa ER 25-100 MG tablet controlled release Commonly known as: SINEMET CR TAKE 1 TABLET BY MOUTH THREE TIMES A DAY   Centrum Silver 50+Men  Tabs Take 1 tablet by mouth daily with breakfast.   cephALEXin 500 MG capsule Commonly known as: KEFLEX Take 1 capsule (500 mg total) by mouth every 6 (six) hours for 5 days.   latanoprost 0.005 % ophthalmic solution Commonly known as: XALATAN Place 1 drop into both eyes at bedtime. Resume home eye drop at bedtime   Magnesium 250 MG Tabs Take 250 mg by mouth daily.   niacin 100 MG tablet Commonly known as: VITAMIN B3 Take 100 mg by mouth at bedtime.   tamsulosin 0.4 MG Caps capsule Commonly known as: FLOMAX Take 1 capsule (0.4 mg total) by mouth at bedtime.   VITAMIN B-12 PO Take 1 tablet by mouth daily.   Vitamin D3 Super Strength 50 MCG (2000 UT) Caps Generic drug: Cholecalciferol Take 2,000 Units by mouth daily.   zinc gluconate 50 MG tablet Take 50 mg by mouth daily.        Contact information for follow-up providers     [facility] Follow up.   Contact information: [address] [place] [address] [phone]             Contact information for after-discharge care     Destination     [facility] .   Service: Skilled Nursing Contact information: [address] [place] [address] [phone]                    Allergies  Allergen Reactions   Timolol Maleate Other (See Comments)    Syncope     Consultations: Urology   Procedure(s): 09/29/22 1.  Cytoscopy with clot evacuation 2.  Right ureteral stent placement with tether - 6Fr x 22cm  3.  Fulguration of prior resection site  Discharge Exam: BP (!) 108/49 (BP Location: Right Arm)   Pulse 92   Temp 98.6 F (37 C) (Oral)   Resp 14   Ht 5\' 2"  (1.575 m)   Wt 58.9 kg   SpO2 98%   BMI 23.75 kg/m  Physical Exam Constitutional:      General: He is not in acute distress.    Appearance: Normal appearance.     Comments: Obvious stutter/speech impediment   HENT:     Head: Normocephalic and  atraumatic.     Mouth/Throat:     Mouth: Mucous membranes are moist.  Eyes:     Extraocular Movements: Extraocular movements intact.  Cardiovascular:     Rate and Rhythm: Normal rate and regular rhythm.  Pulmonary:     Effort: Pulmonary effort is normal. No respiratory distress.     Breath sounds: Normal breath sounds. No wheezing.  Abdominal:     General: Bowel sounds are normal. There is no distension.     Palpations: Abdomen is soft.     Tenderness: There is no abdominal tenderness.  Genitourinary:    Comments: Foley now removed  Musculoskeletal:        General: Normal range of motion.     Cervical back: Normal range  of motion and neck supple.  Skin:    General: Skin is warm and dry.  Neurological:     General: No focal deficit present.     Mental Status: He is alert.  Psychiatric:        Mood and Affect: Mood normal.        Behavior: Behavior normal.      The results of significant diagnostics from this hospitalization (including imaging, microbiology, ancillary and laboratory) are listed below for reference.   Microbiology: Recent Results (from the past 240 hour(s))  Urine Culture     Status: Abnormal   Collection Time: 09/27/22  3:28 PM   Specimen: Urine, Clean Catch  Result Value Ref Range Status   Specimen Description   Final    URINE, CLEAN CATCH Performed at [facility], [address] [place], [place] [address]    Special Requests   Final    NONE Performed at [facility], [address], [place], [place] [address]    Culture >=100,000 COLONIES/mL KLEBSIELLA PNEUMONIAE (A)  Final   Report Status 09/30/2022 FINAL  Final   Organism ID, Bacteria KLEBSIELLA PNEUMONIAE (A)  Final      Susceptibility   Klebsiella pneumoniae - MIC*    AMPICILLIN RESISTANT Resistant     CEFAZOLIN <=4 SENSITIVE Sensitive     CEFEPIME <=0.12 SENSITIVE Sensitive     CEFTRIAXONE <=0.25 SENSITIVE Sensitive     CIPROFLOXACIN <=0.25 SENSITIVE  Sensitive     GENTAMICIN <=1 SENSITIVE Sensitive     IMIPENEM <=0.25 SENSITIVE Sensitive     NITROFURANTOIN 128 RESISTANT Resistant     TRIMETH/SULFA <=20 SENSITIVE Sensitive     AMPICILLIN/SULBACTAM 4 SENSITIVE Sensitive     PIP/TAZO <=4 SENSITIVE Sensitive     * >=100,000 COLONIES/mL KLEBSIELLA PNEUMONIAE  Culture, blood (Routine X 2) w Reflex to ID Panel     Status: None   Collection Time: 09/27/22  8:25 PM   Specimen: BLOOD RIGHT FOREARM  Result Value Ref Range Status   Specimen Description   Final    BLOOD RIGHT FOREARM Performed at [facility], [address], [place], [place] [address]    Special Requests   Final    BOTTLES DRAWN AEROBIC AND ANAEROBIC Blood Culture adequate volume Performed at [facility], [address], [place], [place] [address]    Culture   Final    NO GROWTH 5 DAYS Performed at [facility], [address], [place], [place] [address]    Report Status 10/02/2022 FINAL  Final  Culture, blood (Routine X 2) w Reflex to ID Panel     Status: None   Collection Time: 09/27/22  8:35 PM   Specimen: BLOOD RIGHT HAND  Result Value Ref Range Status   Specimen Description   Final    BLOOD RIGHT HAND Performed at [facility], [address] [place], [place] [address]    Special Requests   Final    BOTTLES DRAWN AEROBIC AND ANAEROBIC Blood Culture adequate volume Performed at [facility], [address] [place], [place] [address]    Culture   Final    NO GROWTH 5 DAYS Performed at [facility], [address], [place], [place] [address]    Report Status 10/02/2022 FINAL  Final  MRSA Next Gen by PCR, Nasal     Status: None   Collection Time: 09/28/22 12:05 PM   Specimen: Nasal Mucosa;  Nasal Swab  Result Value Ref Range Status   MRSA by PCR Next Gen NOT DETECTED NOT DETECTED Final    Comment: (NOTE) The GeneXpert MRSA Assay (FDA approved for NASAL specimens only), is one component of a  comprehensive MRSA colonization surveillance program. It is not intended to diagnose MRSA infection nor to guide or monitor treatment for MRSA infections. Test performance is not FDA approved in patients less than 62 years old. Performed at [facility], [address] [place], [place] [address]      Labs: BNP (last 3 results) No results for input(s): "BNP" in the last 8760 hours. Basic Metabolic Panel: Recent Labs  Lab 09/28/22 1817 09/29/22 0856 10/04/22 0512 10/05/22 0512  NA 131* 134* 127* 129*  K 3.6 3.7 3.7 3.6  CL 100 100 93* 96*  CO2 22 24 24 25   GLUCOSE 138* 119* 104* 104*  BUN 12 11 12 12   CREATININE 0.75 0.73 0.53* 0.42*  CALCIUM 8.0* 8.1* 8.8* 8.4*  MG  --   --  2.1  --    Liver Function Tests: Recent Labs  Lab 10/04/22 0512  AST 34  ALT 51*  ALKPHOS 63  BILITOT 0.6  PROT 6.2*  ALBUMIN 3.4*   No results for input(s): "LIPASE", "AMYLASE" in the last 168 hours. No results for input(s): "AMMONIA" in the last 168 hours. CBC: Recent Labs  Lab 10/01/22 0525 10/02/22 0554 10/03/22 0535 10/04/22 0512 10/05/22 0512  WBC  --   --   --  7.9  --   HGB 8.3* 8.4* 8.4* 8.7* 7.7*  HCT 24.0* 24.3* 24.8* 25.9* 23.0*  MCV  --   --   --  86.6  --   PLT  --   --   --  455*  --    Cardiac Enzymes: No results for input(s): "CKTOTAL", "CKMB", "CKMBINDEX", "TROPONINI" in the last 168 hours. BNP: Invalid input(s): "POCBNP" CBG: Recent Labs  Lab 10/04/22 1205 10/04/22 1641 10/04/22 2032 10/05/22 0729 10/05/22 1137  GLUCAP 143* 134* 147* 98 125*   D-Dimer No results for input(s): "DDIMER" in the last 72 hours. Hgb A1c No results for input(s): "HGBA1C" in the last 72 hours. Lipid Profile No results for input(s): "CHOL", "HDL", "LDLCALC", "TRIG", "CHOLHDL", "LDLDIRECT" in the last 72 hours. Thyroid function studies No results for input(s): "TSH", "T4TOTAL", "T3FREE", "THYROIDAB" in the last 72 hours.  Invalid input(s): "FREET3" Anemia  work up No results for input(s): "VITAMINB12", "FOLATE", "FERRITIN", "TIBC", "IRON", "RETICCTPCT" in the last 72 hours. Urinalysis    Component Value Date/Time   COLORURINE RED (A) 09/27/2022 1528   APPEARANCEUR TURBID (A) 09/27/2022 1528   LABSPEC  09/27/2022 1528    TEST NOT REPORTED DUE TO COLOR INTERFERENCE OF URINE PIGMENT   PHURINE  09/27/2022 1528    TEST NOT REPORTED DUE TO COLOR INTERFERENCE OF URINE PIGMENT   GLUCOSEU (A) 09/27/2022 1528    TEST NOT REPORTED DUE TO COLOR INTERFERENCE OF URINE PIGMENT   HGBUR LARGE (A) 09/27/2022 1528   BILIRUBINUR (A) 09/27/2022 1528    TEST NOT REPORTED DUE TO COLOR INTERFERENCE OF URINE PIGMENT   KETONESUR (A) 09/27/2022 1528    TEST NOT REPORTED DUE TO COLOR INTERFERENCE OF URINE PIGMENT   PROTEINUR (A) 09/27/2022 1528    TEST NOT REPORTED DUE TO COLOR INTERFERENCE OF URINE PIGMENT   NITRITE (A) 09/27/2022 1528    TEST NOT REPORTED DUE TO COLOR INTERFERENCE OF URINE PIGMENT   LEUKOCYTESUR (A) 09/27/2022  1528    TEST NOT REPORTED DUE TO COLOR INTERFERENCE OF URINE PIGMENT   Sepsis Labs Recent Labs  Lab 10/04/22 0512  WBC 7.9   Microbiology Recent Results (from the past 240 hour(s))  Urine Culture     Status: Abnormal   Collection Time: 09/27/22  3:28 PM   Specimen: Urine, Clean Catch  Result Value Ref Range Status   Specimen Description   Final    URINE, CLEAN CATCH Performed at [facility], [address] [place], [place] [address]    Special Requests   Final    NONE Performed at [facility], [address], [place], [place] [address]    Culture >=100,000 COLONIES/mL KLEBSIELLA PNEUMONIAE (A)  Final   Report Status 09/30/2022 FINAL  Final   Organism ID, Bacteria KLEBSIELLA PNEUMONIAE (A)  Final      Susceptibility   Klebsiella pneumoniae - MIC*    AMPICILLIN RESISTANT Resistant     CEFAZOLIN <=4 SENSITIVE Sensitive     CEFEPIME <=0.12 SENSITIVE Sensitive     CEFTRIAXONE <=0.25  SENSITIVE Sensitive     CIPROFLOXACIN <=0.25 SENSITIVE Sensitive     GENTAMICIN <=1 SENSITIVE Sensitive     IMIPENEM <=0.25 SENSITIVE Sensitive     NITROFURANTOIN 128 RESISTANT Resistant     TRIMETH/SULFA <=20 SENSITIVE Sensitive     AMPICILLIN/SULBACTAM 4 SENSITIVE Sensitive     PIP/TAZO <=4 SENSITIVE Sensitive     * >=100,000 COLONIES/mL KLEBSIELLA PNEUMONIAE  Culture, blood (Routine X 2) w Reflex to ID Panel     Status: None   Collection Time: 09/27/22  8:25 PM   Specimen: BLOOD RIGHT FOREARM  Result Value Ref Range Status   Specimen Description   Final    BLOOD RIGHT FOREARM Performed at [facility], [address], [place], [place] [address]    Special Requests   Final    BOTTLES DRAWN AEROBIC AND ANAEROBIC Blood Culture adequate volume Performed at [facility], [address], [place], [place] [address]    Culture   Final    NO GROWTH 5 DAYS Performed at [facility], [address], [place], [place] [address]    Report Status 10/02/2022 FINAL  Final  Culture, blood (Routine X 2) w Reflex to ID Panel     Status: None   Collection Time: 09/27/22  8:35 PM   Specimen: BLOOD RIGHT HAND  Result Value Ref Range Status   Specimen Description   Final    BLOOD RIGHT HAND Performed at [facility], [address], [place], [place] [address]    Special Requests   Final    BOTTLES DRAWN AEROBIC AND ANAEROBIC Blood Culture adequate volume Performed at [facility], [address], [place], [place] [address]    Culture   Final    NO GROWTH 5 DAYS Performed at [facility], [address] [place], [place] [address]    Report Status 10/02/2022 FINAL  Final  MRSA Next Gen by PCR, Nasal     Status: None   Collection Time: 09/28/22 12:05 PM   Specimen: Nasal Mucosa; Nasal Swab  Result Value Ref Range Status   MRSA by PCR Next Gen NOT DETECTED NOT DETECTED Final    Comment: (NOTE) The GeneXpert MRSA Assay (FDA  approved for NASAL specimens only), is one component of a comprehensive MRSA colonization surveillance program. It is not intended to diagnose MRSA infection nor to guide  or monitor treatment for MRSA infections. Test performance is not FDA approved in patients less than 21 years old. Performed at [facility], [address], [place], [place] [address]     Procedures/Studies: CT PELVIS WO CONTRAST  Result Date: 09/27/2022 CLINICAL DATA:  Evaluating for bladder clots post urologic procedure. Bloody drainage at Foley insertion site. EXAM: CT PELVIS WITHOUT CONTRAST TECHNIQUE: Multidetector CT imaging of the pelvis was performed following the standard protocol without intravenous contrast. RADIATION DOSE REDUCTION: This exam was performed according to the departmental dose-optimization program which includes automated exposure control, adjustment of the mA and/or kV according to patient size and/or use of iterative reconstruction technique. COMPARISON:  08/25/2022 FINDINGS: Urinary Tract: Foley catheter with inflated balloon in the bladder. The bladder is decompressed. Decompression of the bladder limits evaluation but no discrete abnormality is identified. Bowel: Visualized large and small bowel are not abnormally distended. No inflammatory changes are appreciated. The appendix is normal. Vascular/Lymphatic: Calcification of the iliac arteries. No aneurysm. No significant lymphadenopathy. Reproductive:  Seed implants in the prostate gland. Other: No free air or free fluid in the pelvis. Postoperative changes in the left groin. Musculoskeletal: Degenerative changes in the spine. No focal bone lesions. IMPRESSION: 1. Bladder is contracted around a Foley catheter, limiting evaluation. No discrete abnormality is identified as visualized. If there is clinical concern for residual bladder lesion or hematoma, consider repeat imaging with clamping of the Foley catheter. 2. Prostate seed  implants. 3. Atherosclerotic changes in the iliac arteries. Electronically Signed   By: Burman Nieves M.D.   On: 09/27/2022 19:49     Time coordinating discharge: Over 30 minutes    Lewie Chamber, MD  [facility] 10/05/2022, 1:59 PM

## 2022-10-05 NOTE — Progress Notes (Signed)
Urology Inpatient Progress Report  Hyponatremia [E87.1] Gross hematuria [R31.0] Hematuria, unspecified type [R31.9]  Procedure(s): CYSTOSCOPY WITH FULGARATION AND CLOT EVACUATION WITH STENT PLACEMENT  6 Days Post-Op   Intv/Subj: 6/11:  Principal Problem:   Gross hematuria Active Problems:   Complicated UTI (urinary tract infection)   Hyponatremia   Pressure injury of skin   Hyperglycemia   Bladder tumor  Current Facility-Administered Medications  Medication Dose Route Frequency Provider Last Rate Last Admin   acetaminophen (TYLENOL) tablet 650 mg  650 mg Oral Q6H PRN Dow Adolph N, DO   650 mg at 10/01/22 1926   cephALEXin (KEFLEX) capsule 500 mg  500 mg Oral Q6H Kasandra Knudsen D, MD   500 mg at 10/05/22 0102   Chlorhexidine Gluconate Cloth 2 % PADS 6 each  6 each Topical Daily Noel Christmas, MD   6 each at 10/05/22 0908   cholecalciferol (VITAMIN D3) 25 MCG (1000 UNIT) tablet 2,000 Units  2,000 Units Oral Daily Kasandra Knudsen D, MD   2,000 Units at 10/05/22 0908   cyanocobalamin (VITAMIN B12) tablet 1,000 mcg  1,000 mcg Oral Daily Dow Adolph N, DO   1,000 mcg at 10/05/22 0908   HYDROmorphone (DILAUDID) injection 0.5 mg  0.5 mg Intravenous Q4H PRN Dow Adolph N, DO   0.5 mg at 09/29/22 0321   lactated ringers infusion   Intravenous Continuous Lewie Chamber, MD 75 mL/hr at 10/05/22 0605 New Bag at 10/05/22 0605   latanoprost (XALATAN) 0.005 % ophthalmic solution 1 drop  1 drop Both Eyes QHS Kasandra Knudsen D, MD   1 drop at 10/04/22 1747   magnesium oxide (MAG-OX) tablet 400 mg  400 mg Oral Daily Kasandra Knudsen D, MD   400 mg at 10/02/22 0857   melatonin tablet 5 mg  5 mg Oral QHS PRN Dow Adolph N, DO   5 mg at 10/03/22 2156   multivitamin with minerals tablet 1 tablet  1 tablet Oral Q breakfast Kasandra Knudsen D, MD   1 tablet at 10/04/22 7253   niacin (VITAMIN B3) tablet 100 mg  100 mg Oral QHS Kasandra Knudsen D, MD   100 mg at 10/04/22 2121   oxyCODONE (Oxy  IR/ROXICODONE) immediate release tablet 5 mg  5 mg Oral Q6H PRN Dow Adolph N, DO   5 mg at 09/29/22 0431   polyethylene glycol (MIRALAX / GLYCOLAX) packet 17 g  17 g Oral Daily PRN Dow Adolph N, DO   17 g at 10/01/22 1736   prochlorperazine (COMPAZINE) injection 5 mg  5 mg Intravenous Q6H PRN Dow Adolph N, DO   5 mg at 09/27/22 2117   sodium chloride irrigation 0.9 % 3,000 mL  3,000 mL Irrigation Continuous Terald Sleeper, MD 0 mL/hr at 09/28/22 0519 3,000 mL at 09/29/22 0647   tamsulosin (FLOMAX) capsule 0.4 mg  0.4 mg Oral QHS Dow Adolph N, DO   0.4 mg at 10/04/22 2121   zinc sulfate capsule 220 mg  220 mg Oral Daily Kasandra Knudsen D, MD   220 mg at 09/29/22 1850     Objective: Vital: Vitals:   10/04/22 0419 10/04/22 1222 10/04/22 2032 10/05/22 0619  BP: (!) 111/50 (!) 102/53 (!) 121/52 (!) 112/42  Pulse: 78 80 87 83  Resp:  20 18 18   Temp:  98.1 F (36.7 C) 98.4 F (36.9 C) 97.9 F (36.6 C)  TempSrc:  Oral Oral Oral  SpO2: 97% 99% 97% 98%  Weight:      Height:  I/Os: I/O last 3 completed shifts: In: 1854.6 [P.O.:940; I.V.:914.6] Out: 1950 [Urine:1950]  Physical Exam:  General: Patient is in no apparent distress Lungs: Normal respiratory effort, chest expands symmetrically. GI: The abdomen is soft and nontender Foley: 3 way foley draining clear medium red urine on slow drip, no clots  Ext: lower extremities symmetric  Lab Results: Recent Labs    10/03/22 0535 10/04/22 0512 10/05/22 0512  WBC  --  7.9  --   HGB 8.4* 8.7* 7.7*  HCT 24.8* 25.9* 23.0*   Recent Labs    10/04/22 0512 10/05/22 0512  NA 127* 129*  K 3.7 3.6  CL 93* 96*  CO2 24 25  GLUCOSE 104* 104*  BUN 12 12  CREATININE 0.53* 0.42*  CALCIUM 8.8* 8.4*   No results for input(s): "LABPT", "INR" in the last 72 hours. No results for input(s): "LABURIN" in the last 72 hours. Results for orders placed or performed during the hospital encounter of 09/27/22  Urine Culture     Status:  Abnormal   Collection Time: 09/27/22  3:28 PM   Specimen: Urine, Clean Catch  Result Value Ref Range Status   Specimen Description   Final    URINE, CLEAN CATCH Performed at [facility], [address], [place], [place] [address]    Special Requests   Final    NONE Performed at [facility], [address] [place], [place] [address]    Culture >=100,000 COLONIES/mL KLEBSIELLA PNEUMONIAE (A)  Final   Report Status 09/30/2022 FINAL  Final   Organism ID, Bacteria KLEBSIELLA PNEUMONIAE (A)  Final      Susceptibility   Klebsiella pneumoniae - MIC*    AMPICILLIN RESISTANT Resistant     CEFAZOLIN <=4 SENSITIVE Sensitive     CEFEPIME <=0.12 SENSITIVE Sensitive     CEFTRIAXONE <=0.25 SENSITIVE Sensitive     CIPROFLOXACIN <=0.25 SENSITIVE Sensitive     GENTAMICIN <=1 SENSITIVE Sensitive     IMIPENEM <=0.25 SENSITIVE Sensitive     NITROFURANTOIN 128 RESISTANT Resistant     TRIMETH/SULFA <=20 SENSITIVE Sensitive     AMPICILLIN/SULBACTAM 4 SENSITIVE Sensitive     PIP/TAZO <=4 SENSITIVE Sensitive     * >=100,000 COLONIES/mL KLEBSIELLA PNEUMONIAE  Culture, blood (Routine X 2) w Reflex to ID Panel     Status: None   Collection Time: 09/27/22  8:25 PM   Specimen: BLOOD RIGHT FOREARM  Result Value Ref Range Status   Specimen Description   Final    BLOOD RIGHT FOREARM Performed at [facility], [address], [place], [place] [address]    Special Requests   Final    BOTTLES DRAWN AEROBIC AND ANAEROBIC Blood Culture adequate volume Performed at [facility], [address], [place], [place] [address]    Culture   Final    NO GROWTH 5 DAYS Performed at [facility], [address], [place], [place] [address]    Report Status 10/02/2022 FINAL  Final  Culture, blood (Routine X 2) w Reflex to ID Panel     Status: None   Collection Time: 09/27/22  8:35 PM   Specimen: BLOOD RIGHT HAND  Result Value Ref Range Status    Specimen Description   Final    BLOOD RIGHT HAND Performed at [facility], [address] [place], [place] [address]    Special Requests   Final    BOTTLES DRAWN AEROBIC AND ANAEROBIC Blood Culture adequate  volume Performed at [facility], [address], [place], [place] [address]    Culture   Final    NO GROWTH 5 DAYS Performed at [facility], [address], [place], [place] [address]    Report Status 10/02/2022 FINAL  Final  MRSA Next Gen by PCR, Nasal     Status: None   Collection Time: 09/28/22 12:05 PM   Specimen: Nasal Mucosa; Nasal Swab  Result Value Ref Range Status   MRSA by PCR Next Gen NOT DETECTED NOT DETECTED Final    Comment: (NOTE) The GeneXpert MRSA Assay (FDA approved for NASAL specimens only), is one component of a comprehensive MRSA colonization surveillance program. It is not intended to diagnose MRSA infection nor to guide or monitor treatment for MRSA infections. Test performance is not FDA approved in patients less than 60 years old. Performed at [facility], [address] [place], [place] [address]     Studies/Results: No results found.  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   Plan: #Gross hematuria  Ongoing gross hematuria postoperatively.  Underwent voiding trial successfully with clear yellow urine 6/4.  Back into urinary retention secondary to clot obstruction that night requiring placement of three-way Foley catheter, hand irrigation, and CBI.  Clot evacuation and fulguration on 6/5.  Tethered stent inadvertently pulled on 6/10  Lightly blood-tinged urine in tubing.  Will attempt voiding trial again this morning.  Interval drop in hemoglobin, presumed dilutional.  Small amount of persisting hematuria does not coincide with 1 point hemoglobin drop. 7.7  (8.7)   Jeffrey Kirschner, NP [facility] Pager: [phone]  10/05/2022, 10:27 AM

## 2022-10-05 NOTE — TOC Transition Note (Signed)
Transition of Care [facility]) - CM/SW Discharge Note   Patient Details  Name: Jeffrey Porter MRN: [id number] Date of Birth: 1931-07-12  Transition of Care [facility]) CM/SW Contact:  Howell Rucks, RN Phone Number: 10/05/2022, 3:56 PM   Clinical Narrative:  DC order to return to [facility] with [facility] PT/OT provided by the facility. DC Summary, FL2 and HH orders faxed to Lurena Joiner( phone [phone]) at [facility] at [phone]. RM 318. Nurse Report [phone] (facility main number). No further TOC needs identified.      Final next level of care: Home w Home Health Services Barriers to Discharge: Continued Medical Work up   Patient Goals and CMS Choice CMS Medicare.gov Compare Post Acute Care list provided to:: Patient Represenative (must comment) ([place](dtr)) Choice offered to / list presented to : Adult Children  Discharge Placement                         Discharge Plan and Services Additional resources added to the After Visit Summary for     Discharge Planning Services: CM Consult Post Acute Care Choice: Resumption of Svcs/PTA Provider (return ALF possible HHPT facility uses legacy if needed.)                               Social Determinants of Health (SDOH) Interventions SDOH Screenings   Food Insecurity: No Food Insecurity (09/28/2022)  Housing: Low Risk  (09/28/2022)  Transportation Needs: No Transportation Needs (09/28/2022)  Utilities: Not At Risk (09/28/2022)  Tobacco Use: Medium Risk (09/30/2022)     Readmission Risk Interventions    09/28/2022    4:40 PM 09/28/2022   10:25 AM  Readmission Risk Prevention Plan  Transportation Screening Complete Complete  PCP or Specialist Appt within 3-5 Days  Complete  HRI or Home Care Consult Complete Complete  Social Work Consult for Recovery Care Planning/Counseling  Complete  Palliative Care Screening  Complete  Medication Review Oceanographer)  Complete

## 2022-10-05 NOTE — TOC Progression Note (Addendum)
Transition of Care [facility]) - Progression Note    Patient Details  Name: Jeffrey Porter MRN: [id number] Date of Birth: 07-29-1931  Transition of Care [facility]) CM/SW Contact  Howell Rucks, RN Phone Number: 10/05/2022, 9:50 AM  Clinical Narrative: Voicemail received from  Health and [facility] offering Peer to Peer for SNF request, contact information: [phone], opt 5, deadline October 05, 2022 at 1:30pm EST. MD notfiied.    -11:25am Dr Kasandra Knudsen, Urologist, is listed attending. MD notified of peer to peer request for SNF request, Dr Arita Miss in OR today, teams chat received from Dr Arita Miss requesting contact information, contact information provided. Await insurance determination.   -11:45pm Peer to Peer completed, per teams chat from Dr Arita Miss " they said that SNF is denied bc of limited participation with no out of bed transferring. note said too weak to try" .  Continued teams chat per Dr Arita Miss "PT could try and work and see if he can participate and see if he would do better. They need update by 4pm. Teams chat to PT group to request PT session today. TOC will continue to follow.   -12:25pm Call received from Dr Arita Miss, request to defer any further peer to peer request to medicine team.   -12:50pm Call to [facility] per PT request to obtain pt's PLOF and level of function pt can be accepted back to ALF, sw Lurena Joiner, reports pt needed assist with ADL's, independent with grooming, used w/c for long distances, was able to ambulate independently in his room. Per Lurena Joiner, facility has inhouse therapy, will need [facility] PT/OT orders, unable to accept with with any drains in place, can accept with foley cath but will need [facility] RN orders. Team notified.  -3:15pm Call to pt's dtr [name]) updated on insurance SNF denial and  change of dc plan by MD for pt  to return [facility] with facility provided [facility] PT/OT. Lura voiced understanding, reports she will call facility to verify dc plan. Lura had no further  questions/concerns.      Expected Discharge Plan: Assisted Living Barriers to Discharge: Continued Medical Work up  Expected Discharge Plan and Services   Discharge Planning Services: CM Consult Post Acute Care Choice: Resumption of Svcs/PTA Provider (return ALF possible HHPT facility uses legacy if needed.) Living arrangements for the past 2 months: Assisted Living Facility Expected Discharge Date: 10/03/22                                     Social Determinants of Health (SDOH) Interventions SDOH Screenings   Food Insecurity: No Food Insecurity (09/28/2022)  Housing: Low Risk  (09/28/2022)  Transportation Needs: No Transportation Needs (09/28/2022)  Utilities: Not At Risk (09/28/2022)  Tobacco Use: Medium Risk (09/30/2022)    Readmission Risk Interventions    09/28/2022    4:40 PM 09/28/2022   10:25 AM  Readmission Risk Prevention Plan  Transportation Screening Complete Complete  PCP or Specialist Appt within 3-5 Days  Complete  HRI or Home Care Consult Complete Complete  Social Work Consult for Recovery Care Planning/Counseling  Complete  Palliative Care Screening  Complete  Medication Review Oceanographer)  Complete

## 2022-10-05 NOTE — Hospital Course (Signed)
Jeffrey Porter is an 89 y.o. male past medical history significant for urethral stricture urethral calculi bladder tumor status post cystoscopy with laser treated.  Ttransurethral resection of bladder tumor on 09/14/2022 discharged home with a Foley catheter. Since then had gross hematuria, suprapubic pain; urology was consulted.  Patient underwent imaging with CT during hospitalization along with treatment with CBI.  This was able to be tapered off and urine remained pink-tinged with stable hemoglobin.  His Foley catheter was removed on 10/05/2022 and he was able to adequately void per nursing after Foley removal. Prior to discharge, he had been restarted back on fluids due to some hyponatremia which caused some hemodilution of hemoglobin.  This was not felt to represent any significant amount of bleeding, rather hemodilution.

## 2022-10-05 NOTE — NC FL2 (Signed)
[place] MEDICAID FL2 LEVEL OF CARE FORM     IDENTIFICATION  Patient Name: Jeffrey Porter Birthdate: 01/04/1932 Sex: male Admission Date (Current Location): 09/27/2022  [facility] and [place] Number:  Producer, television/film/video and Address:  [facility],  [address] [place], [place] [address]      Provider Number: [id number]  Attending Physician Name and Address:  Lewie Chamber, MD  Relative Name and Phone Number:  [name]  (Daughter) [phone] [facility])    Current Level of Care: Hospital Recommended Level of Care: Assisted Living Facility Prior Approval Number:    Date Approved/Denied:   PASRR Number: [id number]  Discharge Plan: Other (Comment) (ALF)    Current Diagnoses: Patient Active Problem List   Diagnosis Date Noted   Pressure injury of skin 09/28/2022   Hyperglycemia 09/28/2022   Bladder tumor 09/28/2022   Gross hematuria 09/23/2022   CAP (community acquired pneumonia) 06/20/2021   Debility 06/20/2021   Pneumonia 06/20/2021   Hypokalemia 06/19/2021   Hypocalcemia 06/19/2021   Elevated LFTs 06/19/2021   SIRS (systemic inflammatory response syndrome) (HCC) 06/18/2021   Essential hypertension 12/22/2017   History of prostate cancer 12/22/2017   Glaucoma 12/22/2017   Aortic stenosis 12/22/2017   OA (osteoarthritis) 12/22/2017   Abnormal LFTs 12/22/2017   Thrombocytosis 12/22/2017   Normocytic anemia 12/22/2017   Hyponatremia 12/22/2017   Cellulitis and abscess of left leg 12/21/2017   Left ankle swelling    Acute respiratory failure with hypoxia (HCC)    Paroxysmal supraventricular tachycardia    Sepsis (HCC) 12/06/2017   Complicated UTI (urinary tract infection) 12/06/2017   Tachycardia 12/06/2017   Leukocytosis 12/06/2017    Orientation RESPIRATION BLADDER Height & Weight     Self, Time, Situation, Place  Normal Continent Weight: 58.9 kg Height:  5\' 2"  (157.5 cm)  BEHAVIORAL SYMPTOMS/MOOD NEUROLOGICAL BOWEL NUTRITION STATUS       Continent Diet (regular)  AMBULATORY STATUS COMMUNICATION OF NEEDS Skin   Extensive Assist Verbally Normal                       Personal Care Assistance Level of Assistance  Bathing, Feeding, Dressing Bathing Assistance: Maximum assistance Feeding assistance: Limited assistance Dressing Assistance: Maximum assistance     Functional Limitations Info  Sight, Hearing, Speech Sight Info: Impaired (eyeglasses) Hearing Info: Adequate Speech Info: Impaired (some stuttering)    SPECIAL CARE FACTORS FREQUENCY  OT (By licensed OT), PT (By licensed PT)     PT Frequency: 5x/wk OT Frequency: 5x/wk            Contractures Contractures Info: Not present    Additional Factors Info  Code Status, Allergies, Psychotropic Code Status Info: Full Code Allergies Info: Timolol Maleate Psychotropic Info: N/A         Current Medications (10/05/2022):  This is the current hospital active medication list Current Facility-Administered Medications  Medication Dose Route Frequency Provider Last Rate Last Admin   acetaminophen (TYLENOL) tablet 650 mg  650 mg Oral Q6H PRN Dow Adolph N, DO   650 mg at 10/01/22 1926   cephALEXin (KEFLEX) capsule 500 mg  500 mg Oral Q6H Kasandra Knudsen D, MD   500 mg at 10/05/22 1436   Chlorhexidine Gluconate Cloth 2 % PADS 6 each  6 each Topical Daily Noel Christmas, MD   6 each at 10/05/22 0908   cholecalciferol (VITAMIN D3) 25 MCG (1000 UNIT) tablet 2,000 Units  2,000 Units Oral Daily Noel Christmas, MD  2,000 Units at 10/05/22 0908   cyanocobalamin (VITAMIN B12) tablet 1,000 mcg  1,000 mcg Oral Daily Dow Adolph N, DO   1,000 mcg at 10/05/22 1610   HYDROmorphone (DILAUDID) injection 0.5 mg  0.5 mg Intravenous Q4H PRN Dow Adolph N, DO   0.5 mg at 09/29/22 0321   lactated ringers infusion   Intravenous Continuous Lewie Chamber, MD 75 mL/hr at 10/05/22 1437 Infusion Verify at 10/05/22 1437   latanoprost (XALATAN) 0.005 % ophthalmic solution 1 drop  1  drop Both Eyes QHS Kasandra Knudsen D, MD   1 drop at 10/04/22 1747   magnesium oxide (MAG-OX) tablet 400 mg  400 mg Oral Daily Kasandra Knudsen D, MD   400 mg at 10/02/22 0857   melatonin tablet 5 mg  5 mg Oral QHS PRN Dow Adolph N, DO   5 mg at 10/03/22 2156   multivitamin with minerals tablet 1 tablet  1 tablet Oral Q breakfast Kasandra Knudsen D, MD   1 tablet at 10/04/22 9604   niacin (VITAMIN B3) tablet 100 mg  100 mg Oral QHS Kasandra Knudsen D, MD   100 mg at 10/04/22 2121   oxyCODONE (Oxy IR/ROXICODONE) immediate release tablet 5 mg  5 mg Oral Q6H PRN Dow Adolph N, DO   5 mg at 09/29/22 0431   polyethylene glycol (MIRALAX / GLYCOLAX) packet 17 g  17 g Oral Daily PRN Dow Adolph N, DO   17 g at 10/01/22 1736   prochlorperazine (COMPAZINE) injection 5 mg  5 mg Intravenous Q6H PRN Dow Adolph N, DO   5 mg at 09/27/22 2117   sodium chloride irrigation 0.9 % 3,000 mL  3,000 mL Irrigation Continuous Terald Sleeper, MD 0 mL/hr at 09/28/22 0519 3,000 mL at 09/29/22 0647   tamsulosin (FLOMAX) capsule 0.4 mg  0.4 mg Oral QHS Dow Adolph N, DO   0.4 mg at 10/04/22 2121   zinc sulfate capsule 220 mg  220 mg Oral Daily Kasandra Knudsen D, MD   220 mg at 09/29/22 1850     Discharge Medications: Please see discharge summary for a list of discharge medications.  Relevant Imaging Results:  Relevant Lab Results:   Additional Information [id number]  Howell Rucks, RN

## 2022-10-05 NOTE — Progress Notes (Signed)
Urology Inpatient Progress Report   Procedure(s): CYSTOSCOPY WITH FULGARATION AND CLOT EVACUATION WITH STENT PLACEMENT  6 Days Post-Op   Intv/Subj: No acute events overnight. Patient is without complaint.  Principal Problem:   Gross hematuria Active Problems:   Complicated UTI (urinary tract infection)   Hyponatremia   Pressure injury of skin   Hyperglycemia   Bladder tumor  Current Facility-Administered Medications  Medication Dose Route Frequency Provider Last Rate Last Admin   acetaminophen (TYLENOL) tablet 650 mg  650 mg Oral Q6H PRN Dow Adolph N, DO   650 mg at 10/01/22 1926   cephALEXin (KEFLEX) capsule 500 mg  500 mg Oral Q6H Kasandra Knudsen D, MD   500 mg at 10/05/22 4540   Chlorhexidine Gluconate Cloth 2 % PADS 6 each  6 each Topical Daily Noel Christmas, MD   6 each at 10/05/22 0908   cholecalciferol (VITAMIN D3) 25 MCG (1000 UNIT) tablet 2,000 Units  2,000 Units Oral Daily Kasandra Knudsen D, MD   2,000 Units at 10/05/22 0908   cyanocobalamin (VITAMIN B12) tablet 1,000 mcg  1,000 mcg Oral Daily Dow Adolph N, DO   1,000 mcg at 10/05/22 0908   HYDROmorphone (DILAUDID) injection 0.5 mg  0.5 mg Intravenous Q4H PRN Dow Adolph N, DO   0.5 mg at 09/29/22 0321   lactated ringers infusion   Intravenous Continuous Lewie Chamber, MD 75 mL/hr at 10/05/22 0605 New Bag at 10/05/22 0605   latanoprost (XALATAN) 0.005 % ophthalmic solution 1 drop  1 drop Both Eyes QHS Kasandra Knudsen D, MD   1 drop at 10/04/22 1747   magnesium oxide (MAG-OX) tablet 400 mg  400 mg Oral Daily Kasandra Knudsen D, MD   400 mg at 10/02/22 0857   melatonin tablet 5 mg  5 mg Oral QHS PRN Dow Adolph N, DO   5 mg at 10/03/22 2156   multivitamin with minerals tablet 1 tablet  1 tablet Oral Q breakfast Kasandra Knudsen D, MD   1 tablet at 10/04/22 9811   niacin (VITAMIN B3) tablet 100 mg  100 mg Oral QHS Kasandra Knudsen D, MD   100 mg at 10/04/22 2121   oxyCODONE (Oxy IR/ROXICODONE) immediate release  tablet 5 mg  5 mg Oral Q6H PRN Dow Adolph N, DO   5 mg at 09/29/22 0431   polyethylene glycol (MIRALAX / GLYCOLAX) packet 17 g  17 g Oral Daily PRN Dow Adolph N, DO   17 g at 10/01/22 1736   prochlorperazine (COMPAZINE) injection 5 mg  5 mg Intravenous Q6H PRN Dow Adolph N, DO   5 mg at 09/27/22 2117   sodium chloride irrigation 0.9 % 3,000 mL  3,000 mL Irrigation Continuous Terald Sleeper, MD 0 mL/hr at 09/28/22 0519 3,000 mL at 09/29/22 0647   tamsulosin (FLOMAX) capsule 0.4 mg  0.4 mg Oral QHS Dow Adolph N, DO   0.4 mg at 10/04/22 2121   zinc sulfate capsule 220 mg  220 mg Oral Daily Kasandra Knudsen D, MD   220 mg at 09/29/22 1850     Objective: Vital: Vitals:   10/04/22 0419 10/04/22 1222 10/04/22 2032 10/05/22 0619  BP: (!) 111/50 (!) 102/53 (!) 121/52 (!) 112/42  Pulse: 78 80 87 83  Resp:  20 18 18   Temp:  98.1 F (36.7 C) 98.4 F (36.9 C) 97.9 F (36.6 C)  TempSrc:  Oral Oral Oral  SpO2: 97% 99% 97% 98%  Weight:      Height:  I/Os: I/O last 3 completed shifts: In: 1854.6 [P.O.:940; I.V.:914.6] Out: 1950 [Urine:1950]  Physical Exam:  General: Patient is in no apparent distress Lungs: Normal respiratory effort, chest expands symmetrically. GI: The abdomen is soft and nontender  JP drain with serosanguinous drainage Foley: 24Fr 3-way foley with inflow port capped - draining yellow/tea colored urine Ext: lower extremities symmetric  Lab Results: Recent Labs    10/03/22 0535 10/04/22 0512 10/05/22 0512  WBC  --  7.9  --   HGB 8.4* 8.7* 7.7*  HCT 24.8* 25.9* 23.0*   Recent Labs    10/04/22 0512 10/05/22 0512  NA 127* 129*  K 3.7 3.6  CL 93* 96*  CO2 24 25  GLUCOSE 104* 104*  BUN 12 12  CREATININE 0.53* 0.42*  CALCIUM 8.8* 8.4*   No results for input(s): "LABPT", "INR" in the last 72 hours. No results for input(s): "LABURIN" in the last 72 hours. Results for orders placed or performed during the hospital encounter of 09/27/22  Urine  Culture     Status: Abnormal   Collection Time: 09/27/22  3:28 PM   Specimen: Urine, Clean Catch  Result Value Ref Range Status   Specimen Description   Final    URINE, CLEAN CATCH Performed at [facility], [address], [place], [place] [address]    Special Requests   Final    NONE Performed at [facility], [address] [place], [place] [address]    Culture >=100,000 COLONIES/mL KLEBSIELLA PNEUMONIAE (A)  Final   Report Status 09/30/2022 FINAL  Final   Organism ID, Bacteria KLEBSIELLA PNEUMONIAE (A)  Final      Susceptibility   Klebsiella pneumoniae - MIC*    AMPICILLIN RESISTANT Resistant     CEFAZOLIN <=4 SENSITIVE Sensitive     CEFEPIME <=0.12 SENSITIVE Sensitive     CEFTRIAXONE <=0.25 SENSITIVE Sensitive     CIPROFLOXACIN <=0.25 SENSITIVE Sensitive     GENTAMICIN <=1 SENSITIVE Sensitive     IMIPENEM <=0.25 SENSITIVE Sensitive     NITROFURANTOIN 128 RESISTANT Resistant     TRIMETH/SULFA <=20 SENSITIVE Sensitive     AMPICILLIN/SULBACTAM 4 SENSITIVE Sensitive     PIP/TAZO <=4 SENSITIVE Sensitive     * >=100,000 COLONIES/mL KLEBSIELLA PNEUMONIAE  Culture, blood (Routine X 2) w Reflex to ID Panel     Status: None   Collection Time: 09/27/22  8:25 PM   Specimen: BLOOD RIGHT FOREARM  Result Value Ref Range Status   Specimen Description   Final    BLOOD RIGHT FOREARM Performed at [facility], [address], [place], [place] [address]    Special Requests   Final    BOTTLES DRAWN AEROBIC AND ANAEROBIC Blood Culture adequate volume Performed at [facility], [address] [place], [place] [address]    Culture   Final    NO GROWTH 5 DAYS Performed at [facility], [address] [place], [place] [address]    Report Status 10/02/2022 FINAL  Final  Culture, blood (Routine X 2) w Reflex to ID Panel     Status: None   Collection Time: 09/27/22  8:35 PM   Specimen: BLOOD RIGHT HAND  Result Value Ref  Range Status   Specimen Description   Final    BLOOD RIGHT HAND Performed at [facility], [address] [place], [place] [address]    Special Requests   Final    BOTTLES DRAWN AEROBIC AND  ANAEROBIC Blood Culture adequate volume Performed at [facility], [address] [place], [place] [address]    Culture   Final    NO GROWTH 5 DAYS Performed at [facility], [address], [place], [place] [address]    Report Status 10/02/2022 FINAL  Final  MRSA Next Gen by PCR, Nasal     Status: None   Collection Time: 09/28/22 12:05 PM   Specimen: Nasal Mucosa; Nasal Swab  Result Value Ref Range Status   MRSA by PCR Next Gen NOT DETECTED NOT DETECTED Final    Comment: (NOTE) The GeneXpert MRSA Assay (FDA approved for NASAL specimens only), is one component of a comprehensive MRSA colonization surveillance program. It is not intended to diagnose MRSA infection nor to guide or monitor treatment for MRSA infections. Test performance is not FDA approved in patients less than 62 years old. Performed at [facility], [address] [place], [place] [address]     Studies/Results: No results found.  Assessment: Procedure(s): CYSTOSCOPY WITH FULGARATION AND CLOT EVACUATION WITH STENT PLACEMENT, 6 Days Post-Op  doing well.  Plan: Gross hematuria: -foley removed today for void trial -stent has been removed -Hgb has been stable but dropped this morning (Discussed with medicine team and agree that likely diluational as urine has remained clear)   Kasandra Knudsen, MD Urology 10/05/2022, 10:27 AM

## 2022-10-05 NOTE — Progress Notes (Signed)
Physical Therapy Treatment Patient Details Name: Jeffrey Porter MRN: [id number] DOB: August 23, 1931 Today's Date: 10/05/2022   History of Present Illness 89 y.o. male past medical history significant for urethral stricture urethral calculi bladder tumor status post cystoscopy with laser treated.  See final dilation of urethra secondary to stricture and transurethral resection of bladder tumor on 09/14/2022 discharged home with a Foley catheter since then has had gross hematuria suprapubic pain urology was consulted and saw the patient irrigated the patient and to small blood clots came out.  Urology recommended CT of the abdomen and pelvis and bladder irrigation as due to Foley many pretension he is at high risk of UTI.    PT Comments    Pt seen for PT tx with pt received in bed. Pt requires encouragement & education re: participation throughout session; appears to have some cognitive deficits but unsure if these are new or baseline. Pt requires up to max assist to complete bed mobility with hospital bed features, max assist STS, & max assist stand pivot with RW bed>recliner on R. Pt with complaints of BLE feet pain (notes hx of plantar fasciits) & L patellar pain during transfer. At this time, pt requires significant assistance for all mobility tasks & is a high fall risk. Recommend ongoing PT services to address strengthening, balance, endurance, & to progress bed mobility, transfers, & gait as able.    Recommendations for follow up therapy are one component of a multi-disciplinary discharge planning process, led by the attending physician.  Recommendations may be updated based on patient status, additional functional criteria and insurance authorization.  Follow Up Recommendations  Can patient physically be transported by private vehicle: No    Assistance Recommended at Discharge Frequent or constant Supervision/Assistance  Patient can return home with the following Two people to help with  bathing/dressing/bathroom;Help with stairs or ramp for entrance;Direct supervision/assist for medications management;Two people to help with walking and/or transfers;Assist for transportation;Assistance with cooking/housework;Direct supervision/assist for financial management   Equipment Recommendations  None recommended by PT (TBD in next venue)    Recommendations for Other Services       Precautions / Restrictions Precautions Precautions: Fall Precaution Comments: pt denies falls in past 6 months Restrictions Weight Bearing Restrictions: No     Mobility  Bed Mobility Overal bed mobility: Needs Assistance Bed Mobility: Rolling, Sidelying to Sit Rolling: Mod assist Sidelying to sit: Max assist       General bed mobility comments: max cuing for sequencing & technique, cuing to use bed rails, HOB elevated    Transfers Overall transfer level: Needs assistance Equipment used: Rolling walker (2 wheels) Transfers: Sit to/from Stand, Bed to chair/wheelchair/BSC Sit to Stand: Max assist Stand pivot transfers: Max assist         General transfer comment: Pt transferred STS from elevated EOB with max assist with MAX cuing for hand placement. PT attempts to cue pt for step pivot bed>recliner on R but pt frequently letting go of RW to hold to chair armrests despite max encouragement & PT ulitmately having to perform hand over hand to keep RUE on RW. Pt able to take ~1 step with PT providing manual facilitation for weight shifting L<>R & initiating steps but pt ultimately pivoting on BLE. Pt standing on forefoot of BLE, does not attempt to place either feet flat on floor. Pt with c/o BLE feet & L kneecap pain during transfer.    Ambulation/Gait  Stairs             Wheelchair Mobility    Modified Rankin (Stroke Patients Only)       Balance Overall balance assessment: Needs assistance Sitting-balance support: Feet supported, Bilateral upper  extremity supported, No upper extremity supported Sitting balance-Leahy Scale: Poor Sitting balance - Comments: Pt with posterior LOB when attempting to perform either LE LAQ, with poor awareness & ability to right himself.   Standing balance support: Bilateral upper extremity supported, During functional activity, Reliant on assistive device for balance Standing balance-Leahy Scale: Zero                              Cognition Arousal/Alertness: Awake/alert Behavior During Therapy: WFL for tasks assessed/performed Overall Cognitive Status: No family/caregiver present to determine baseline cognitive functioning                                 General Comments: Pt requires max multimodal cuing throughout session. Pt frequently tells PT to mobilize him "sit me up, scoot me forward" with PT providing ongoing max encouragement to attempt tasks on his own before requesting for help & importance of participation with mobility to increase his strength & independence. Decreased overall awareness, decreased safety awareness. Pt with some stuttering throughout session.        Exercises      General Comments General comments (skin integrity, edema, etc.): Pt noted purewick was leaking so PT assisted pt into changing into clean gown & NT notified.      Pertinent Vitals/Pain Pain Assessment Pain Assessment: Faces Faces Pain Scale: Hurts whole lot Pain Location: abdominal discomfort 2/2 constipation, BLE feet 2/2 hx of plantar fasciitis, L knee cap Pain Descriptors / Indicators: Discomfort, Guarding, Grimacing Pain Intervention(s): Monitored during session, Repositioned, Limited activity within patient's tolerance    Home Living                          Prior Function            PT Goals (current goals can now be found in the care plan section) Acute Rehab PT Goals Patient Stated Goal: to get stronger PT Goal Formulation: With patient Time For Goal  Achievement: 10/12/22 Potential to Achieve Goals: Fair Progress towards PT goals: Progressing toward goals    Frequency    Min 1X/week      PT Plan Current plan remains appropriate    Co-evaluation              AM-PAC PT "6 Clicks" Mobility   Outcome Measure  Help needed turning from your back to your side while in a flat bed without using bedrails?: A Lot Help needed moving from lying on your back to sitting on the side of a flat bed without using bedrails?: Total Help needed moving to and from a bed to a chair (including a wheelchair)?: Total Help needed standing up from a chair using your arms (e.g., wheelchair or bedside chair)?: Total Help needed to walk in hospital room?: Total Help needed climbing 3-5 steps with a railing? : Total 6 Click Score: 7    End of Session Equipment Utilized During Treatment: Gait belt Activity Tolerance: Patient tolerated treatment well Patient left: in chair;with chair alarm set;with call bell/phone within reach Nurse Communication: Mobility status PT Visit Diagnosis: Difficulty in walking, not  elsewhere classified (R26.2);Muscle weakness (generalized) (M62.81);Pain;Other abnormalities of gait and mobility (R26.89);Unsteadiness on feet (R26.81) Pain - Right/Left:  (bilateral feet, L kneecap)     Time: 1610-9604 PT Time Calculation (min) (ACUTE ONLY): 24 min  Charges:  $Therapeutic Activity: 23-37 mins                     Aleda Grana, PT, DPT 10/05/22, 1:20 PM   Sandi Mariscal 10/05/2022, 1:18 PM

## 2022-10-06 DIAGNOSIS — R2689 Other abnormalities of gait and mobility: Secondary | ICD-10-CM | POA: Diagnosis not present

## 2022-10-06 DIAGNOSIS — M6259 Muscle wasting and atrophy, not elsewhere classified, multiple sites: Secondary | ICD-10-CM | POA: Diagnosis not present

## 2022-10-06 DIAGNOSIS — R2681 Unsteadiness on feet: Secondary | ICD-10-CM | POA: Diagnosis not present

## 2022-10-08 DIAGNOSIS — M6259 Muscle wasting and atrophy, not elsewhere classified, multiple sites: Secondary | ICD-10-CM | POA: Diagnosis not present

## 2022-10-08 DIAGNOSIS — R2689 Other abnormalities of gait and mobility: Secondary | ICD-10-CM | POA: Diagnosis not present

## 2022-10-08 DIAGNOSIS — R2681 Unsteadiness on feet: Secondary | ICD-10-CM | POA: Diagnosis not present

## 2022-10-11 DIAGNOSIS — R2681 Unsteadiness on feet: Secondary | ICD-10-CM | POA: Diagnosis not present

## 2022-10-11 DIAGNOSIS — M6259 Muscle wasting and atrophy, not elsewhere classified, multiple sites: Secondary | ICD-10-CM | POA: Diagnosis not present

## 2022-10-11 DIAGNOSIS — R2689 Other abnormalities of gait and mobility: Secondary | ICD-10-CM | POA: Diagnosis not present

## 2022-10-12 DIAGNOSIS — M6259 Muscle wasting and atrophy, not elsewhere classified, multiple sites: Secondary | ICD-10-CM | POA: Diagnosis not present

## 2022-10-12 DIAGNOSIS — R2689 Other abnormalities of gait and mobility: Secondary | ICD-10-CM | POA: Diagnosis not present

## 2022-10-12 DIAGNOSIS — R2681 Unsteadiness on feet: Secondary | ICD-10-CM | POA: Diagnosis not present

## 2022-10-13 DIAGNOSIS — D649 Anemia, unspecified: Secondary | ICD-10-CM | POA: Diagnosis not present

## 2022-10-13 DIAGNOSIS — Z6822 Body mass index (BMI) 22.0-22.9, adult: Secondary | ICD-10-CM | POA: Diagnosis not present

## 2022-10-13 DIAGNOSIS — D494 Neoplasm of unspecified behavior of bladder: Secondary | ICD-10-CM | POA: Diagnosis not present

## 2022-10-13 DIAGNOSIS — G214 Vascular parkinsonism: Secondary | ICD-10-CM | POA: Diagnosis not present

## 2022-10-13 DIAGNOSIS — Z09 Encounter for follow-up examination after completed treatment for conditions other than malignant neoplasm: Secondary | ICD-10-CM | POA: Diagnosis not present

## 2022-10-13 DIAGNOSIS — E871 Hypo-osmolality and hyponatremia: Secondary | ICD-10-CM | POA: Diagnosis not present

## 2022-10-13 DIAGNOSIS — E46 Unspecified protein-calorie malnutrition: Secondary | ICD-10-CM | POA: Diagnosis not present

## 2022-10-15 DIAGNOSIS — R2681 Unsteadiness on feet: Secondary | ICD-10-CM | POA: Diagnosis not present

## 2022-10-15 DIAGNOSIS — R2689 Other abnormalities of gait and mobility: Secondary | ICD-10-CM | POA: Diagnosis not present

## 2022-10-15 DIAGNOSIS — M6259 Muscle wasting and atrophy, not elsewhere classified, multiple sites: Secondary | ICD-10-CM | POA: Diagnosis not present

## 2022-10-18 DIAGNOSIS — R2689 Other abnormalities of gait and mobility: Secondary | ICD-10-CM | POA: Diagnosis not present

## 2022-10-18 DIAGNOSIS — M6259 Muscle wasting and atrophy, not elsewhere classified, multiple sites: Secondary | ICD-10-CM | POA: Diagnosis not present

## 2022-10-18 DIAGNOSIS — R2681 Unsteadiness on feet: Secondary | ICD-10-CM | POA: Diagnosis not present

## 2022-10-20 DIAGNOSIS — R2681 Unsteadiness on feet: Secondary | ICD-10-CM | POA: Diagnosis not present

## 2022-10-20 DIAGNOSIS — M6259 Muscle wasting and atrophy, not elsewhere classified, multiple sites: Secondary | ICD-10-CM | POA: Diagnosis not present

## 2022-10-20 DIAGNOSIS — R2689 Other abnormalities of gait and mobility: Secondary | ICD-10-CM | POA: Diagnosis not present

## 2022-10-21 DIAGNOSIS — Z8551 Personal history of malignant neoplasm of bladder: Secondary | ICD-10-CM | POA: Diagnosis not present

## 2022-10-21 DIAGNOSIS — G214 Vascular parkinsonism: Secondary | ICD-10-CM | POA: Diagnosis not present

## 2022-10-21 DIAGNOSIS — R32 Unspecified urinary incontinence: Secondary | ICD-10-CM | POA: Diagnosis not present

## 2022-10-21 DIAGNOSIS — I350 Nonrheumatic aortic (valve) stenosis: Secondary | ICD-10-CM | POA: Diagnosis not present

## 2022-10-22 DIAGNOSIS — R2681 Unsteadiness on feet: Secondary | ICD-10-CM | POA: Diagnosis not present

## 2022-10-22 DIAGNOSIS — R2689 Other abnormalities of gait and mobility: Secondary | ICD-10-CM | POA: Diagnosis not present

## 2022-10-22 DIAGNOSIS — M6259 Muscle wasting and atrophy, not elsewhere classified, multiple sites: Secondary | ICD-10-CM | POA: Diagnosis not present

## 2022-10-25 DIAGNOSIS — R2681 Unsteadiness on feet: Secondary | ICD-10-CM | POA: Diagnosis not present

## 2022-10-25 DIAGNOSIS — M6259 Muscle wasting and atrophy, not elsewhere classified, multiple sites: Secondary | ICD-10-CM | POA: Diagnosis not present

## 2022-10-25 DIAGNOSIS — R2689 Other abnormalities of gait and mobility: Secondary | ICD-10-CM | POA: Diagnosis not present

## 2022-10-26 DIAGNOSIS — M6259 Muscle wasting and atrophy, not elsewhere classified, multiple sites: Secondary | ICD-10-CM | POA: Diagnosis not present

## 2022-10-26 DIAGNOSIS — R2681 Unsteadiness on feet: Secondary | ICD-10-CM | POA: Diagnosis not present

## 2022-10-26 DIAGNOSIS — R2689 Other abnormalities of gait and mobility: Secondary | ICD-10-CM | POA: Diagnosis not present

## 2022-10-29 DIAGNOSIS — R2689 Other abnormalities of gait and mobility: Secondary | ICD-10-CM | POA: Diagnosis not present

## 2022-10-29 DIAGNOSIS — R2681 Unsteadiness on feet: Secondary | ICD-10-CM | POA: Diagnosis not present

## 2022-10-29 DIAGNOSIS — M6259 Muscle wasting and atrophy, not elsewhere classified, multiple sites: Secondary | ICD-10-CM | POA: Diagnosis not present

## 2022-11-02 DIAGNOSIS — R2681 Unsteadiness on feet: Secondary | ICD-10-CM | POA: Diagnosis not present

## 2022-11-02 DIAGNOSIS — M6259 Muscle wasting and atrophy, not elsewhere classified, multiple sites: Secondary | ICD-10-CM | POA: Diagnosis not present

## 2022-11-02 DIAGNOSIS — R2689 Other abnormalities of gait and mobility: Secondary | ICD-10-CM | POA: Diagnosis not present

## 2023-05-20 DIAGNOSIS — H5203 Hypermetropia, bilateral: Secondary | ICD-10-CM | POA: Diagnosis not present
